# Patient Record
Sex: Male | Born: 1955 | Race: White | Hispanic: No | Marital: Single | Smoking: Former smoker
Health system: Southern US, Community
[De-identification: ages and names within clinical notes are randomized; demographics above are authoritative.]

## PROBLEM LIST (undated history)

## (undated) DIAGNOSIS — G8929 Other chronic pain: Secondary | ICD-10-CM

## (undated) DIAGNOSIS — F419 Anxiety disorder, unspecified: Secondary | ICD-10-CM

## (undated) DIAGNOSIS — M4850XA Collapsed vertebra, not elsewhere classified, site unspecified, initial encounter for fracture: Secondary | ICD-10-CM

## (undated) DIAGNOSIS — J189 Pneumonia, unspecified organism: Secondary | ICD-10-CM

## (undated) DIAGNOSIS — F1910 Other psychoactive substance abuse, uncomplicated: Secondary | ICD-10-CM

## (undated) DIAGNOSIS — I10 Essential (primary) hypertension: Secondary | ICD-10-CM

## (undated) DIAGNOSIS — I2510 Atherosclerotic heart disease of native coronary artery without angina pectoris: Secondary | ICD-10-CM

## (undated) HISTORY — PX: KNEE ARTHROSCOPY: SUR90

## (undated) HISTORY — PX: PENILE CYST REMOVAL: SHX2199

## (undated) HISTORY — PX: BACK SURGERY: SHX140

## (undated) HISTORY — PX: PILONIDAL CYST EXCISION: SHX744

---

## 2015-09-24 ENCOUNTER — Emergency Department (HOSPITAL_COMMUNITY)
Admission: EM | Admit: 2015-09-24 | Discharge: 2015-09-24 | Disposition: A | Discharge status: Home / Self Care | Payer: Medicare Other | Attending: Emergency Medicine | Admitting: Emergency Medicine

## 2015-09-24 ENCOUNTER — Emergency Department (HOSPITAL_COMMUNITY): Payer: Medicare Other

## 2015-09-24 ENCOUNTER — Encounter (HOSPITAL_COMMUNITY): Payer: Self-pay | Admitting: Emergency Medicine

## 2015-09-24 DIAGNOSIS — I10 Essential (primary) hypertension: Secondary | ICD-10-CM | POA: Diagnosis present

## 2015-09-24 DIAGNOSIS — Z79899 Other long term (current) drug therapy: Secondary | ICD-10-CM | POA: Diagnosis not present

## 2015-09-24 DIAGNOSIS — M7989 Other specified soft tissue disorders: Secondary | ICD-10-CM | POA: Insufficient documentation

## 2015-09-24 DIAGNOSIS — Z720 Tobacco use: Secondary | ICD-10-CM | POA: Insufficient documentation

## 2015-09-24 HISTORY — DX: Essential (primary) hypertension: I10

## 2015-09-24 MED ORDER — NAPROXEN 500 MG PO TABS: 500.0000 mg | ORAL_TABLET | Freq: Two times a day (BID) | ORAL | Status: DC

## 2015-09-24 NOTE — ED Provider Notes (Signed)
CSN: [id number]     Arrival date & time 09/24/15  1102 History   First MD Initiated Contact with Patient 09/24/15 1300     Chief Complaint  Patient presents with  . Hypertension     (Consider location/radiation/quality/duration/timing/severity/associated sxs/prior Treatment) Patient is a 59 y.o. male presenting with hypertension. The history is provided by the patient.  Hypertension Pertinent negatives include no chest pain, no abdominal pain, no headaches and no shortness of breath.   patient here with 2 concerns. Concerns about the elevated blood pressure concerned about a swelling area to his left medial knee area that is concerned may be a blood clot. Patient without any chest pain or shortness of breath. Patient states he has past history of hypertension but currently not on any meds. 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  Past Medical History  Diagnosis Date  . Hypertension    Past Surgical History  Procedure Laterality Date  . Back surgery    . Knee arthroscopy     Family History  Problem Relation Age of Onset  . Cancer Mother   . Hypertension Mother   . Heart failure Mother   . Cancer Father   . Hypertension Father   . Heart failure Father    Social History  Substance Use Topics  . Smoking status: Current Every Day Smoker -- 1.00 packs/day    Types: Cigarettes  . Smokeless tobacco: Never Used  . Alcohol Use: Yes     Comment: occas    Review of Systems  Constitutional: Negative for fever.  HENT: Negative for congestion.   Eyes: Negative for visual disturbance.  Respiratory: Negative for shortness of breath.   Cardiovascular: Positive for leg swelling. Negative for chest pain.  Gastrointestinal: Negative for abdominal pain.  Genitourinary: Negative for dysuria.  Musculoskeletal: Negative for back pain.  Skin: Negative for rash.  Neurological: Negative for headaches.  Hematological: Does not bruise/bleed easily.  Psychiatric/Behavioral:  Negative for confusion.      Allergies  Review of patient's allergies indicates no known allergies.  Home Medications   Prior to Admission medications   Medication Sig Start Date End Date Taking? Authorizing Provider  gabapentin (NEURONTIN) 300 MG capsule Take 300 mg by mouth 3 (three) times daily.   Yes Historical Provider, MD  oxyCODONE-acetaminophen (PERCOCET) 5-325 MG tablet Take 1 tablet by mouth 2 (two) times daily.   Yes Historical Provider, MD  naproxen (NAPROSYN) 500 MG tablet Take 1 tablet (500 mg total) by mouth 2 (two) times daily. 09/24/15   Vanetta MuldersScott Mykenna Viele, MD   BP 121/92 mmHg  Pulse 63  Temp(Src) 98.2 F (36.8 C) (Oral)  Resp 18  Ht 6\' 1"  (1.854 m)  Wt 248 lb (112.492 kg)  BMI 32.73 kg/m2  SpO2 98% Physical Exam  Constitutional: He is oriented to person, place, and time. He appears well-developed and well-nourished. No distress.  HENT:  Head: Normocephalic and atraumatic.  Mouth/Throat: Oropharynx is clear and moist.  Eyes: Conjunctivae and EOM are normal. Pupils are equal, round, and reactive to light.  Neck: Normal range of motion. Neck supple.  Cardiovascular: Normal rate, regular rhythm and normal heart sounds.   No murmur heard. Pulmonary/Chest: Effort normal and breath sounds normal. No respiratory distress.  Abdominal: Soft. Bowel sounds are normal. There is no tenderness.  Musculoskeletal: Normal range of motion. He exhibits no edema or tenderness.  Left leg around the area of the knee with a 2 x 4 cm mass it's fleshy  in nature may very well be a lipoma. Could be some dilated veins. No significant tenderness to it. No knee swelling. Distally no swelling to the ankle. Dorsalis pedis pulses 2+. Patient does have some varicose veins medially along the left leg consistent with the greater saphenous distribution.  Neurological: He is alert and oriented to person, place, and time. No cranial nerve deficit. He exhibits normal muscle tone. Coordination normal.   Skin: Skin is warm. No erythema.  Nursing note and vitals reviewed.   ED Course  Procedures (including critical care time) Labs Review Labs Reviewed - No data to display  Imaging Review No results found. I have personally reviewed and evaluated these images and lab results as part of my medical decision-making.   EKG Interpretation None      MDM   Final diagnoses:  Essential hypertension  Leg swelling    Patient with the marginal diastolic blood pressures here today will need follow-up blood may have hypertension that needs to be treated. Resource guide provided to help patient find follow-up. The patient left leg is proximal part of the knee medially has an area of swelling. This may very well represent some dilation of veins due to varicose veins but seems to be more consistent with a lipoma and that it's fleshy in nature. No calf tenderness no ankle swelling. His dorsalis pedis pulses 2+. No swelling to the knee.  If Doppler studies are negative patient can be discharged home with follow-up for blood pressure Naprosyn as needed for any leg discomfort. Patient without any shortness of breath or chest pain.    Vanetta Mulders, MD 09/24/15 [phone]

## 2015-09-24 NOTE — Discharge Instructions (Signed)
Resource guide provided to help you get follow-up for the high blood pressure. This needs to be checked on him or regular basis. May require treatment for hypertension. Take the Naprosyn as needed for leg discomfort. Return for any new or worse symptoms.   Emergency Department Resource Guide 1) Find a Doctor and Pay Out of Pocket Although you won't have to find out who is covered by your insurance plan, it is a good idea to ask around and get recommendations. You will then need to call the office and see if the doctor you have chosen will accept you as a new patient and what types of options they offer for patients who are self-pay. Some doctors offer discounts or will set up payment plans for their patients who do not have insurance, but you will need to ask so you aren't surprised when you get to your appointment.  2) Contact Your Local Health Department Not all health departments have doctors that can see patients for sick visits, but many do, so it is worth a call to see if yours does. If you don't know where your local health department is, you can check in your phone book. The CDC also has a tool to help you locate your state's health department, and many state websites also have listings of all of their local health departments.  3) Find a Walk-in Clinic If your illness is not likely to be very severe or complicated, you may want to try a walk in clinic. These are popping up all over the country in pharmacies, drugstores, and shopping centers. They're usually staffed by nurse practitioners or physician assistants that have been trained to treat common illnesses and complaints. They're usually fairly quick and inexpensive. However, if you have serious medical issues or chronic medical problems, these are probably not your best option.  No Primary Care Doctor: - Call Health Connect at  [phone] - they can help you locate a primary care doctor that  accepts your insurance, provides certain  services, etc. - Physician Referral Service- [phone]  Chronic Pain Problems: Organization         Address  Phone   Notes  [facility]  [phone] Patients need to be referred by their primary care doctor.   Medication Assistance: Organization         Address  Phone   Notes  [facility] Medication [facility] [address] [place], [place] [address] [phone] --Must be a resident of [facility] -- Must have NO insurance coverage whatsoever (no Medicaid/ Medicare, etc.) -- The pt. MUST have a primary care doctor that directs their care regularly and follows them in the community   MedAssist  [phone]   [employer]  [phone]    Agencies that provide inexpensive medical care: Organization         Address  Phone   Notes  [facility]  [phone]   [facility]    [phone]   [facility] [address] [place], [place] [address] [phone]   [facility] [address], [place] [phone]   [facility]anned Parenthood    [phone]   [facility]    [phone]   [facility]  [address], [place] Phone:  [phone], Fax:  [phone] Hours of Operation:  9 am - 6 pm, M-F.  Also  accepts Medicaid/Medicare and self-pay.  [facility] for [place], [address], [place] Phone: [phone], Fax: [phone]. Hours of Operation:  8:30 am - 5:30 pm, M-F.  Also accepts Medicaid and self-pay.  [facility] [address], [place] Phone: [phone]   [place], [place], [place] [phone], Ext. [phone] Mondays & Thursdays: 7-9 AM.  First 15 patients are seen on a first come, first serve basis.    [place] Providers:  Organization         Address  Phone   Notes  [facility] [address], [place] [phone] Also accepts self-pay patients.  [facility] [id number] [place], [place]  [phone]   [place], [address], [place] [phone]   [facility] [address] [place] [phone]   Lucianne Lei [address], [place]   [phone] Only accepts [place] Access [place] patients after they have their name applied to their card.   Self-Pay (no insurance) in [facility]:  Organization         Address  Phone   Notes  Sickle Cell Patients, [facility] [place] [phone]   [facility] [place] [phone]   [facility] [place]  [place], [place], [place] [phone]   [facility]/Dr. Osei-Bonsu  [address], [place] or [address], [place] [phone] Phone number for both [place] and [place] locations is the same.  [facility] [address] [place] [phone]   [facility] [address], [place] or [address] [phone]   [facility] [address], [place] [phone], phone; [phone], fax Sees patients 1st and 3rd Saturday of every month.  Must not qualify for public or private insurance (i.e. Medicaid, Medicare, [place] Health Choice, Veterans' Benefits)  Household income should be no more than 200% of the poverty level The clinic cannot treat you if you are pregnant or think you are pregnant  Sexually transmitted diseases are not treated at the clinic.    Dental Care: Organization         Address  Phone  Notes  [facility] [phone] Accepts  children up to age 58 who are enrolled in [place] or [place]; pregnant women with a Medicaid card; and children who have applied for Medicaid or [place] Health Choice, but were declined, whose parents can pay a reduced fee at time of service.  [facility]  [address], [place] [phone] Accepts children up to age 73 who are enrolled in [place] or [place]; pregnant women with a Medicaid card; and children who have applied for Medicaid or [place] Health Choice, but were declined, whose parents can pay a reduced fee at time of service.  [facility] Adult [facility] Access PROGRAM  [place] [phone] Patients are seen by appointment only. Walk-ins are not accepted. [place] will see patients 13 years of age and older. Monday - Tuesday (8am-5pm)  Most Wednesdays (8:30-5pm) $30 per visit, cash only  [facility] [employer] PROGRAM  [address], [facility] [phone] Patients are seen by appointment only. Walk-ins are not accepted. [place] will see patients 76 years of age and older. One Wednesday Evening (Monthly: Volunteer Based).  $30 per visit, cash only  [place]  [phone] for adults; Children under age 76, call Graduate Pediatric Dentistry at [phone]. Children aged 3-14, please call [phone] to request a pediatric application.  Dental services are provided in all areas of dental care including fillings, crowns and bridges, complete and partial dentures, implants, gum treatment, root canals, and extractions. Preventive care is also provided. Treatment is provided to both adults and children. Patients are selected via a lottery and there is often a waiting list.   [facility] [address] [place]  [phone] [email or web address]   [facility] [address] [place], [place] [phone], Ext. [phone] Second and  Fourth Thursday of each month, opens at 6:30 AM; Clinic ends at 9 AM.  Patients are seen on a first-come first-served basis, and a limited number are seen during each clinic.   [facility]  [address] Hillard Danker [place], [place] [phone]   Eligibility Requirements You must have lived in [place], [place], or [place] counties for at least the last three months.   You cannot be eligible for state or federal sponsored [employer], including [employer], [place], or [employer].   You generally cannot be eligible for healthcare insurance through your employer.    How to apply: Eligibility screenings are held every Tuesday and Wednesday afternoon from 1:00 pm until 4:00 pm. You do not need an appointment for the interview!  [facility] [address], [place], [place]   [place]  [place] Department  Industry  [phone]    Behavioral Health Resources in the Community: Intensive Outpatient Programs Organization         Address  Phone  Notes  [place] Ruth. [address], [place], [place] [phone]   [facility] Outpatient [address], [place], [place]   ADS: Alcohol & Drug Svcs [address], [place], [place]   [place] [address]  [place], [place] or [phone]   Substance Abuse Resources Organization         Address  Phone  Notes  Alcohol and Drug Services  [phone]   [place]  [phone]   [facility] [place]   Chinita Pester  [phone]   Residential & Outpatient Substance Abuse Program  [phone]   Psychological Services Organization         Address  Phone  Notes  [facility] [place]  [place]  [phone]   [place]  [address], [place] or [phone]    Mobile Crisis Teams Organization         Address  Phone  Notes  [facility], Mobile Crisis Care Unit  [phone]   [facility]  [address] [place], [place]   [place] Levels [address] [place] [phone]    Self-Help/Support Groups Organization         Address  Phone  Notes  Mental Health Assoc. of [place] - variety of support groups  [place] Call for more information  Narcotics Anonymous (NA), [facility] [address], [employer] [place]  2 meetings at this location   Special educational needs teacher         Address  Phone  Notes  ASAP Residential Treatment [place],    [place]  [phone]   [facility]  [address] [place] [address]2831, [place], [place]   [place], [place] [phone] Admissions: 8am-3pm M-F  Incentives Substance [place] [address]    [place], [place] [phone]   [facility] [address] [place], [place], [place]   [facility] [address],  [place], [place]   [facility] Programs - Intensive Outpatient [address]een [address], [place], [place]   [facility] [place].,  [place], [place] [phone] or [phone]   [facility] (RTS) [address] [place], [place] Accepts Medicaid  [facility] [employer] [address],  [place] [phone] Substance Abuse/Addiction Treatment   [facility] Organization         Address  Phone  Notes  [facility]  [phone]   Domenic Schwab, PhD [address] Arlis Porta [place], [place]   [phone] or [phone]   [place], [place] [phone]   [facility] [address] [place], [place] [phone] Insurance/Medicaid/sponsorship through [facility] and Families [address], [address] [place]                                    [place], [place] [phone] [place] [address], [place] [phone]    Dr. Adele Schilder  [phone]   [facility] Dept. 1) [address] [place] 2) [place] 3)  [place] 65, [place] [phone]  [phone]   [place] [phone] or [phone] (After Hours)

## 2015-09-24 NOTE — ED Notes (Signed)
Pt reports hypertension and L leg venous edema.

## 2016-10-20 ENCOUNTER — Emergency Department (HOSPITAL_COMMUNITY)
Admission: EM | Admit: 2016-10-20 | Discharge: 2016-10-20 | Disposition: A | Discharge status: Home / Self Care | Payer: Medicare Other | Attending: Emergency Medicine | Admitting: Emergency Medicine

## 2016-10-20 ENCOUNTER — Emergency Department (HOSPITAL_COMMUNITY): Payer: Medicare Other

## 2016-10-20 ENCOUNTER — Encounter (HOSPITAL_COMMUNITY): Payer: Self-pay | Admitting: Emergency Medicine

## 2016-10-20 DIAGNOSIS — Z7982 Long term (current) use of aspirin: Secondary | ICD-10-CM | POA: Diagnosis not present

## 2016-10-20 DIAGNOSIS — Y939 Activity, unspecified: Secondary | ICD-10-CM | POA: Insufficient documentation

## 2016-10-20 DIAGNOSIS — S91332A Puncture wound without foreign body, left foot, initial encounter: Secondary | ICD-10-CM | POA: Diagnosis not present

## 2016-10-20 DIAGNOSIS — Y999 Unspecified external cause status: Secondary | ICD-10-CM | POA: Diagnosis not present

## 2016-10-20 DIAGNOSIS — Z79899 Other long term (current) drug therapy: Secondary | ICD-10-CM | POA: Insufficient documentation

## 2016-10-20 DIAGNOSIS — F17210 Nicotine dependence, cigarettes, uncomplicated: Secondary | ICD-10-CM | POA: Diagnosis not present

## 2016-10-20 DIAGNOSIS — I10 Essential (primary) hypertension: Secondary | ICD-10-CM | POA: Diagnosis not present

## 2016-10-20 DIAGNOSIS — W450XXA Nail entering through skin, initial encounter: Secondary | ICD-10-CM | POA: Insufficient documentation

## 2016-10-20 DIAGNOSIS — S99922A Unspecified injury of left foot, initial encounter: Secondary | ICD-10-CM | POA: Diagnosis present

## 2016-10-20 DIAGNOSIS — Y929 Unspecified place or not applicable: Secondary | ICD-10-CM | POA: Diagnosis not present

## 2016-10-20 DIAGNOSIS — M791 Myalgia: Secondary | ICD-10-CM | POA: Insufficient documentation

## 2016-10-20 DIAGNOSIS — M7918 Myalgia, other site: Secondary | ICD-10-CM

## 2016-10-20 LAB — COMPREHENSIVE METABOLIC PANEL
ALBUMIN: 3.8 g/dL (ref 3.5–5.0)
ALK PHOS: 43 U/L (ref 38–126)
ALT: 21 U/L (ref 17–63)
ANION GAP: 4 — AB (ref 5–15)
AST: 22 U/L (ref 15–41)
BILIRUBIN TOTAL: 0.9 mg/dL (ref 0.3–1.2)
BUN: 12 mg/dL (ref 6–20)
CALCIUM: 8.9 mg/dL (ref 8.9–10.3)
CO2: 28 mmol/L (ref 22–32)
Chloride: 104 mmol/L (ref 101–111)
Creatinine, Ser: 1.16 mg/dL (ref 0.61–1.24)
Glucose, Bld: 108 mg/dL — ABNORMAL HIGH (ref 65–99)
POTASSIUM: 4.4 mmol/L (ref 3.5–5.1)
Sodium: 136 mmol/L (ref 135–145)
TOTAL PROTEIN: 7.2 g/dL (ref 6.5–8.1)

## 2016-10-20 LAB — CBC WITH DIFFERENTIAL/PLATELET
BASOS ABS: 0 10*3/uL (ref 0.0–0.1)
BASOS PCT: 0 %
Eosinophils Absolute: 0.2 10*3/uL (ref 0.0–0.7)
Eosinophils Relative: 3 %
HEMATOCRIT: 45.4 % (ref 39.0–52.0)
Hemoglobin: 15.2 g/dL (ref 13.0–17.0)
LYMPHS PCT: 43 %
Lymphs Abs: 3.7 10*3/uL (ref 0.7–4.0)
MCH: 31.3 pg (ref 26.0–34.0)
MCHC: 33.5 g/dL (ref 30.0–36.0)
MCV: 93.4 fL (ref 78.0–100.0)
Monocytes Absolute: 0.8 10*3/uL (ref 0.1–1.0)
Monocytes Relative: 9 %
NEUTROS ABS: 3.8 10*3/uL (ref 1.7–7.7)
NEUTROS PCT: 45 %
Platelets: 219 10*3/uL (ref 150–400)
RBC: 4.86 MIL/uL (ref 4.22–5.81)
RDW: 13.8 % (ref 11.5–15.5)
WBC: 8.5 10*3/uL (ref 4.0–10.5)

## 2016-10-20 MED ORDER — DOXYCYCLINE HYCLATE 100 MG PO CAPS: 100.0000 mg | ORAL_CAPSULE | Freq: Two times a day (BID) | ORAL | 0 refills | Status: DC

## 2016-10-20 NOTE — Discharge Instructions (Signed)
Your vital signs within normal limits. I find no evidence of fever, no target-like lesions, no muscle weakness or other signs to suggest tickborne illness. The x-ray of your foot is negative for fracture, foreign body, or gas. Please use doxycycline 2 times daily with food until all taken. Please use warm Epsom salt soaks to your foot until the wound has healed. Please see Dr. Romeo AppleHarrison concerning the problems with your right knee.

## 2016-10-20 NOTE — ED Notes (Signed)
Patient transported to X-ray 

## 2016-10-20 NOTE — ED Provider Notes (Signed)
[facility]-EMERGENCY DEPT Provider Note   CSN: [id number] Arrival date & time: 10/20/16  [id number]     History   Chief Complaint Chief Complaint  Patient presents with  . Multiple Complaints    HPI Dave Williams is a 60 y.o. male.  Patient is a 60 year old male who presents to the emergency department with multiple problems. The patient states that he does not have a primary doctor. He says he only goes to see a doctor when he is home remedies no work, or he gets very sick.  Problem #1. "I stepped on a nail". The patient states that approximately 3 months ago he stepped on a rusty nail and he states that he's been having redness and warmth in the area of his left foot. He states that he has been having some fevers on and off for quite some time approximately 100 to high of 101. He has soreness of his feet mostly empties been on his feet for quite some time.  Problem #2 pain of the right knee. The patient states that he has had orthoscopic surgery in the past. He states that [place] He states that when he is on his feet for extended period of time he has swelling and tightness of his right knee. When he gets up in the morning he does not have as much tightness. He also has some discomfort in the knee from time to time. There's been no recent injury or trauma to the knee. No recent operations or procedures.  Problem #3 all my joints ache. The patient states that for over a month she's had pain in his joints, and soreness in his muscles. [place] and he is concerned about tickborne illnesses. He reports some fever from time to time, but has not had any unusual rash. The soreness in the muscles and the pain in the joints is transient. He reports some neck pain. No reported chills. No loss of control of facial or any other muscles.   The history is provided by the patient.    Past Medical History:  Diagnosis Date  .  Hypertension     There are no active problems to display for this patient.   Past Surgical History:  Procedure Laterality Date  . BACK SURGERY    . KNEE ARTHROSCOPY         Home Medications    Prior to Admission medications   Medication Sig Start Date End Date Taking? Authorizing Provider  acetaminophen (TYLENOL) 500 MG tablet Take 500 mg by mouth every 6 (six) hours as needed.   Yes Historical Provider, MD  aspirin 500 MG tablet Take 500 mg by mouth every 6 (six) hours as needed for pain.   Yes Historical Provider, MD    Family History Family History  Problem Relation Age of Onset  . Cancer Mother   . Hypertension Mother   . Heart failure Mother   . Cancer Father   . Hypertension Father   . Heart failure Father     Social History Social History  Substance Use Topics  . Smoking status: Current Every Day Smoker    Packs/day: 1.00    Types: Cigarettes  . Smokeless tobacco: Never Used  . Alcohol use No     Allergies   Patient has no known allergies.   Review of Systems Review of Systems  Musculoskeletal: Positive for arthralgias and myalgias.  Skin: Positive for wound.  All other systems reviewed and are negative.    Physical Exam Updated Vital Signs BP 144/90 (BP Location: Left Arm)   Pulse 80   Temp 98 F (36.7 C) (Oral)   Resp 20   Ht 6\' 3"  (1.905 m)   Wt 113.4 kg   SpO2 100%   BMI 31.25 kg/m   Physical Exam  Constitutional: He is oriented to person, place, and time. He appears well-developed and well-nourished.  Non-toxic appearance.  HENT:  Head: Normocephalic.  Right Ear: Tympanic membrane and external ear normal.  Left Ear: Tympanic membrane and external ear normal.  Eyes: EOM and lids are normal. Pupils are equal, round, and reactive to light.  Neck: Normal range of motion. Neck supple. Carotid bruit is not present.  Cardiovascular: Normal rate, regular rhythm, normal heart sounds, intact distal pulses and normal pulses.     Pulmonary/Chest: Breath sounds normal. No respiratory distress.  Abdominal: Soft. Bowel sounds are normal. There is no tenderness. There is no guarding.  Musculoskeletal: Normal range of motion.       Left foot: There is tenderness. There is normal range of motion.       Feet:  Patient has good range of motion of the upper and lower extremities, but states he has muscle soreness with certain movements.  There is pain with flexion and extension of the right knee. There is crepitus present. There is no effusion noted at this time. The knee is not hot.    Lymphadenopathy:       Head (right side): No submandibular adenopathy present.       Head (left side): No submandibular adenopathy present.    He has no cervical adenopathy.  Neurological: He is alert and oriented to person, place, and time. He has normal strength. No cranial nerve deficit or sensory deficit.  Skin: Skin is warm and dry.  Psychiatric: He has a normal mood and affect. His speech is normal.  Nursing note and vitals reviewed.    ED Treatments / Results  Labs (all labs ordered are listed, but only abnormal results are displayed) Labs Reviewed - No data to display  EKG  EKG Interpretation None       Radiology No results found.  Procedures Procedures (including critical care time)  Medications Ordered in ED Medications - No data to display   Initial Impression / Assessment and Plan / ED Course  I have reviewed the triage vital signs and the nursing notes.  Pertinent labs & imaging results that were available during my care of the patient were reviewed by me and considered in my medical decision making (see chart for details).  Clinical Course     **I have reviewed nursing notes, vital signs, and all appropriate lab and imaging results for this patient.*  Final Clinical Impressions(s) / ED Diagnoses  Vital signs within normal limits. Labs reviewed. Xray reviewed. No acute or Emergent changes at this  time. Pt given resources to obtain a PCP for follow up. Pt to return to the ED if any changes or problems.    Final diagnoses:  None    New Prescriptions New Prescriptions   No medications on file     Ivery QualeHobson Ardith Test, PA-C 10/21/16 2144    Bethann BerkshireJoseph Zammit, MD 10/22/16 [id number]

## 2016-10-20 NOTE — ED Triage Notes (Signed)
Patient states he stepped on a rusty nail 3 months ago and is still having redness and warmth to area on left foot. Also complaining of "all my joints are hurting for over a month."

## 2016-10-21 LAB — B. BURGDORFI ANTIBODIES

## 2018-04-18 ENCOUNTER — Emergency Department (HOSPITAL_COMMUNITY)
Admission: EM | Admit: 2018-04-18 | Discharge: 2018-04-18 | Disposition: A | Discharge status: Home / Self Care | Payer: Medicare Other | Attending: Emergency Medicine | Admitting: Emergency Medicine

## 2018-04-18 ENCOUNTER — Emergency Department (HOSPITAL_COMMUNITY): Payer: Medicare Other

## 2018-04-18 ENCOUNTER — Encounter (HOSPITAL_COMMUNITY): Payer: Self-pay | Admitting: Emergency Medicine

## 2018-04-18 ENCOUNTER — Other Ambulatory Visit: Payer: Self-pay

## 2018-04-18 DIAGNOSIS — M7989 Other specified soft tissue disorders: Secondary | ICD-10-CM | POA: Diagnosis present

## 2018-04-18 DIAGNOSIS — Z7982 Long term (current) use of aspirin: Secondary | ICD-10-CM | POA: Diagnosis not present

## 2018-04-18 DIAGNOSIS — I8002 Phlebitis and thrombophlebitis of superficial vessels of left lower extremity: Secondary | ICD-10-CM

## 2018-04-18 DIAGNOSIS — I82812 Embolism and thrombosis of superficial veins of left lower extremities: Secondary | ICD-10-CM | POA: Insufficient documentation

## 2018-04-18 DIAGNOSIS — F17210 Nicotine dependence, cigarettes, uncomplicated: Secondary | ICD-10-CM | POA: Diagnosis not present

## 2018-04-18 DIAGNOSIS — I10 Essential (primary) hypertension: Secondary | ICD-10-CM | POA: Diagnosis not present

## 2018-04-18 NOTE — ED Triage Notes (Signed)
Patient c/o left leg pain x3 swelling. Patient states "Golfball sized area of swelling that appeared suddenly and is hard to touch."  [facility] sent him here to have ultrasound per patient.

## 2018-04-18 NOTE — Discharge Instructions (Signed)
Take over the counter NSAID (such as ibuprofen or naproxen), as directed on packaging, with food, until seen in follow up. Apply moist heat to the area(s) of discomfort, for 15 minutes at a time, several times per day for the next few days.  Do not fall asleep on a heating pack. Wear compression stockings daily until seen in follow up. Call your regular medical doctor today to schedule a follow up appointment this week. Call the Vascular Surgeon today to schedule a follow up appointment within the next week. Return to the Emergency Department immediately if worsening.

## 2018-04-18 NOTE — ED Notes (Signed)
Patient is off the floor to ultrasound

## 2018-04-18 NOTE — ED Provider Notes (Signed)
[facility] EMERGENCY DEPARTMENT Provider Note   CSN: [id number] Arrival date & time: 04/18/18  1135     History   Chief Complaint Chief Complaint  Patient presents with  . Leg Swelling    HPI Dave Williams is a 62 y.o. male.  HPI  Pt was seen at 1220. Per pt, c/o gradual onset and worsening of persistent "swelling" to left proximal knee area for the past 3 years, worse over the past 3 days. Pt has been evaluated previously for same, dx lipoma and superficial thrombosis of vein. Pt states over the past 3 days the area has "gotten bigger" and he is "worried about a blood clot." Denies injury, no fevers, no focal motor weakness, no tingling/numbness in extremities.   Past Medical History:  Diagnosis Date  . Hypertension     There are no active problems to display for this patient.   Past Surgical History:  Procedure Laterality Date  . BACK SURGERY    . KNEE ARTHROSCOPY    . PENILE CYST REMOVAL          Home Medications    Prior to Admission medications   Medication Sig Start Date End Date Taking? Authorizing Provider  acetaminophen (TYLENOL) 500 MG tablet Take 500 mg by mouth every 6 (six) hours as needed.    [provider]  aspirin 500 MG tablet Take 500 mg by mouth every 6 (six) hours as needed for pain.    [provider]  doxycycline (VIBRAMYCIN) 100 MG capsule Take 1 capsule (100 mg total) by mouth 2 (two) times daily. 10/20/16   Ivery Quale, PA-C    Family History Family History  Problem Relation Age of Onset  . Cancer Mother   . Hypertension Mother   . Heart failure Mother   . Cancer Father   . Hypertension Father   . Heart failure Father     Social History Social History   Tobacco Use  . Smoking status: Current Every Day Smoker    Packs/day: 1.00    Types: Cigarettes  . Smokeless tobacco: Never Used  Substance Use Topics  . Alcohol use: No  . Drug use: No     Allergies   Patient has no known allergies.   Review  of Systems Review of Systems ROS: Statement: All systems negative except as marked or noted in the HPI; Constitutional: Negative for fever and chills. ; ; Eyes: Negative for eye pain, redness and discharge. ; ; ENMT: Negative for ear pain, hoarseness, nasal congestion, sinus pressure and sore throat. ; ; Cardiovascular: Negative for chest pain, palpitations, diaphoresis, dyspnea and peripheral edema. ; ; Respiratory: Negative for cough, wheezing and stridor. ; ; Gastrointestinal: Negative for nausea, vomiting, diarrhea, abdominal pain, blood in stool, hematemesis, jaundice and rectal bleeding. . ; ; Genitourinary: Negative for dysuria, flank pain and hematuria. ; ; Musculoskeletal: Negative for back pain and neck pain. Negative for swelling and trauma.; ; Skin: Negative for pruritus, rash, abrasions, blisters, bruising and +skin lesion.; ; Neuro: Negative for headache, lightheadedness and neck stiffness. Negative for weakness, altered level of consciousness, altered mental status, extremity weakness, paresthesias, involuntary movement, seizure and syncope.       Physical Exam Updated Vital Signs BP 116/73 (BP Location: Right Arm)   Pulse 91   Temp 98 F (36.7 C) (Oral)   Resp 18   Ht  (1.905 m)   Wt 108 kg (238 lb)   SpO2 100%   BMI 29.75 kg/m  Physical Exam 1225: Physical examination:  Nursing notes reviewed; Vital signs and O2 SAT reviewed;  Constitutional: Well developed, Well nourished, Well hydrated, In no acute distress; Head:  Normocephalic, atraumatic; Eyes: EOMI, PERRL, No scleral icterus; ENMT: Mouth and pharynx normal, Mucous membranes moist; Neck: Supple, Full range of motion, No lymphadenopathy; Cardiovascular: Regular rate and rhythm, No gallop; Respiratory: Breath sounds clear & equal bilaterally, No wheezes.  Speaking full sentences with ease, Normal respiratory effort/excursion; Chest: Nontender, Movement normal; Abdomen: Soft, Nontender, Nondistended, Normal bowel  sounds; Genitourinary: No CVA tenderness; Extremities: Peripheral pulses normal, No deformity. No edema, No calf tenderness, edema or asymmetry.  +approximately 4x6cm circumscribed firm area just proximal to left medial knee, NT to palp. Mild overlying erythema and visible varicose vein. No streaking. No fluctuance, no central pointing area, no open wounds. Multiple varicose veins to LLE. Strong palp pedal pulses. NT left knee/ankle/foot. ; Neuro: AA&Ox3, Major CN grossly intact.  Speech clear. No gross focal motor or sensory deficits in extremities.; Skin: Color normal, Warm, Dry.   ED Treatments / Results  Labs (all labs ordered are listed, but only abnormal results are displayed)   EKG None  Radiology   Procedures Procedures (including critical care time)  Medications Ordered in ED Medications - No data to display   Initial Impression / Assessment and Plan / ED Course  I have reviewed the triage vital signs and the nursing notes.  Pertinent labs & imaging results that were available during my care of the patient were reviewed by me and considered in my medical decision making (see chart for details).  MDM Reviewed: previous chart, nursing note and vitals Reviewed previous: ultrasound Interpretation: ultrasound   CLINICAL DATA:  Left lower extremity edema with soft tissue prominence slightly superior to the knee medially.  EXAM: LEFT LOWER EXTREMITY VENOUS DUPLEX ULTRASOUND  TECHNIQUE: Gray-scale sonography with graded compression, as well as color Doppler and duplex ultrasound were performed to evaluate the left lower extremity deep venous system from the level of the common femoral vein and including the common femoral, femoral, profunda femoral, popliteal and calf veins including the posterior tibial, peroneal and gastrocnemius veins when visible. The superficial great saphenous vein was also interrogated. Spectral Doppler was utilized to evaluate flow at rest and  with distal augmentation maneuvers in the common femoral, femoral and popliteal veins.  COMPARISON:  None.  FINDINGS: Contralateral Common Femoral Vein: Respiratory phasicity is normal and symmetric with the symptomatic side. No evidence of thrombus. Normal compressibility.  Common Femoral Vein: No evidence of thrombus. Normal compressibility, respiratory phasicity and response to augmentation.  Saphenofemoral Junction: No evidence of thrombus. Normal compressibility and flow on color Doppler imaging.  Profunda Femoral Vein: No evidence of thrombus. Normal compressibility and flow on color Doppler imaging.  Femoral Vein: No evidence of thrombus. Normal compressibility, respiratory phasicity and response to augmentation.  Popliteal Vein: No evidence of thrombus. Normal compressibility, respiratory phasicity and response to augmentation.  Calf Veins: No evidence of thrombus. Normal compressibility and flow on color Doppler imaging.  Superficial Great Saphenous Vein: In the areas of soft tissue prominence slightly superior to the knee, there are foci of vessel distention with acute appearing thrombus occluding the greater saphenous vein in these areas. There is no thrombus approaching the saphenofemoral junction, however.  Venous Reflux:  None.  Other Findings:  None.  IMPRESSION: No evidence of left lower extremity deep venous thrombosis. Right common femoral vein patent.  In the areas of soft tissue prominence slightly superior  to the left knee medially, there is vessel distention due to thrombus filling the vessel focally in these areas consistent with superficial venous thrombosis. These areas do not show augmentation or compression. The saphenofemoral junction region appears normal.  Electronically Signed   By: Bretta Bang III M.D.   On: 09/24/2015 17:08   US Venous Img Lower  Left (dvt Study) Result Date: 04/18/2018 CLINICAL DATA:  62 year old male with  left lower extremity edema EXAM: LEFT LOWER EXTREMITY VENOUS DOPPLER ULTRASOUND TECHNIQUE: Gray-scale sonography with graded compression, as well as color Doppler and duplex ultrasound were performed to evaluate the lower extremity deep venous systems from the level of the common femoral vein and including the common femoral, femoral, profunda femoral, popliteal and calf veins including the posterior tibial, peroneal and gastrocnemius veins when visible. The superficial great saphenous vein was also interrogated. Spectral Doppler was utilized to evaluate flow at rest and with distal augmentation maneuvers in the common femoral, femoral and popliteal veins. COMPARISON:  None. FINDINGS: Contralateral Common Femoral Vein: Respiratory phasicity is normal and symmetric with the symptomatic side. No evidence of thrombus. Normal compressibility. Common Femoral Vein: No evidence of thrombus. Normal compressibility, respiratory phasicity and response to augmentation. Saphenofemoral Junction: No evidence of thrombus. Normal compressibility and flow on color Doppler imaging. Profunda Femoral Vein: No evidence of thrombus. Normal compressibility and flow on color Doppler imaging. Femoral Vein: No evidence of thrombus. Normal compressibility, respiratory phasicity and response to augmentation. Popliteal Vein: No evidence of thrombus. Normal compressibility, respiratory phasicity and response to augmentation. Calf Veins: No evidence of thrombus. Normal compressibility and flow on color Doppler imaging. Superficial Great Saphenous Vein: Abnormal great saphenous vein in the distal thigh. The vein becomes aneurysmal and is noncompressible. The internal lumen is filled with heterogeneous echogenic material. The vessel appears patent both distally and proximally. Venous Reflux:  None. Other Findings:  None. IMPRESSION: 1. Positive for superficial thrombophlebitis involving the left great saphenous vein and associated prominent venous  varix in the distal thigh. 2. No evidence of deep venous thrombosis. Electronically Signed   By: Malachy Moan M.D.   On: 04/18/2018 13:37    1445:  Spoke with Rads MD: Vasc US today appears similar to previous in 2016 (ie: superficial thrombophlebitis, no DVT). Tx symptomatically, f/u Vasc MD. Dx and testing d/w pt.  Questions answered.  Verb understanding, agreeable to d/c home with outpt f/u.    Final Clinical Impressions(s) / ED Diagnoses   Final diagnoses:  None    ED Discharge Orders    None       Samuel Jester, DO 04/21/18 2114

## 2019-07-22 ENCOUNTER — Emergency Department (HOSPITAL_COMMUNITY): Payer: Medicare Other

## 2019-07-22 ENCOUNTER — Emergency Department (HOSPITAL_COMMUNITY)
Admission: EM | Admit: 2019-07-22 | Discharge: 2019-07-22 | Discharge status: Against Medical Advice | Payer: Medicare Other | Attending: Emergency Medicine | Admitting: Emergency Medicine

## 2019-07-22 ENCOUNTER — Other Ambulatory Visit: Payer: Self-pay

## 2019-07-22 ENCOUNTER — Encounter (HOSPITAL_COMMUNITY): Payer: Self-pay | Admitting: Emergency Medicine

## 2019-07-22 DIAGNOSIS — M25552 Pain in left hip: Secondary | ICD-10-CM

## 2019-07-22 DIAGNOSIS — Z79899 Other long term (current) drug therapy: Secondary | ICD-10-CM | POA: Insufficient documentation

## 2019-07-22 DIAGNOSIS — F17210 Nicotine dependence, cigarettes, uncomplicated: Secondary | ICD-10-CM | POA: Diagnosis not present

## 2019-07-22 DIAGNOSIS — I10 Essential (primary) hypertension: Secondary | ICD-10-CM | POA: Diagnosis not present

## 2019-07-22 NOTE — ED Triage Notes (Signed)
Pt c/o chronic pain to LT hip, but about 10 days ago pain worsened. 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 Pt ambulatory.

## 2019-07-22 NOTE — ED Notes (Signed)
Patient came out of room and stated "I have to go. 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 So I can't wait any longer." Patient signed AMA. Advised Dr Thurnell Garbe. Patient ambulated to exit with no assistance or difficulty.

## 2019-07-22 NOTE — ED Provider Notes (Signed)
[facility] Provider Note   CSN: [id number] Arrival date & time: 07/22/19  1023     History   Chief Complaint Chief Complaint  Patient presents with  . Hip Pain    HPI Dave Williams is a 63 y.o. male.     HPI  Pt was seen at 1155. Per pt, c/o gradual onset and persistence of constant left hip "pain" for the past 2 weeks. 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 Pt denies direct injury. Denies back pain, no neck pain, no abd pain, no N/V/D, no fever, no injury.  Denies incont/retention of bowel or bladder, no saddle anesthesia, no focal motor weakness, no tingling/numbness in extremities, no dysuria/hematuria, no testicular pain/swelling.     Past Medical History:  Diagnosis Date  . Hypertension     There are no active problems to display for this patient.   Past Surgical History:  Procedure Laterality Date  . BACK SURGERY    . KNEE ARTHROSCOPY    . PENILE CYST REMOVAL          Home Medications    Prior to Admission medications   Medication Sig Start Date End Date Taking? Authorizing Provider  gabapentin (NEURONTIN) 100 MG capsule Take 100 mg by mouth 3 (three) times daily. 03/13/18   [provider]  ibuprofen (ADVIL,MOTRIN) 200 MG tablet Take 400 mg by mouth every 6 (six) hours as needed.    [provider]    Family History Family History  Problem Relation Age of Onset  . Cancer Mother   . Hypertension Mother   . Heart failure Mother   . Cancer Father   . Hypertension Father   . Heart failure Father     Social History Social History   Tobacco Use  . Smoking status: Current Every Day Smoker    Packs/day: 0.50    Types: Cigarettes  . Smokeless tobacco: Never Used  Substance Use Topics  . Alcohol use: No  . Drug  use: No     Allergies   Patient has no known allergies.   Review of Systems Review of Systems ROS: Statement: All systems negative except as marked or noted in the HPI; Constitutional: Negative for fever and chills. ; ; Eyes: Negative for eye pain, redness and discharge. ; ; ENMT: Negative for ear pain, hoarseness, nasal congestion, sinus pressure and sore throat. ; ; Cardiovascular: Negative for chest pain, palpitations, diaphoresis, dyspnea and peripheral edema. ; ; Respiratory: Negative for cough, wheezing and stridor. ; ; Gastrointestinal: Negative for nausea, vomiting, diarrhea, abdominal pain, blood in stool, hematemesis, jaundice and rectal bleeding. . ; ; Genitourinary: Negative for dysuria, flank pain and hematuria. ; ; Genital:  No penile drainage or rash, no testicular pain or swelling, no scrotal rash or swelling. ;; Musculoskeletal: +left hip pain. Negative for back pain and neck pain. Negative for swelling and trauma.; ; Skin: Negative for pruritus, rash, abrasions, blisters, bruising and skin lesion.; ; Neuro: Negative for headache, lightheadedness and neck stiffness. Negative for weakness, altered level of consciousness, altered mental status, extremity weakness, paresthesias, involuntary movement, seizure and syncope.       Physical Exam Updated Vital Signs BP (!) 144/91 (BP Location: Right Arm)   Pulse 76   Temp  98.3 F (36.8 C) (Oral)   Resp 16   Ht 6\' 3"  (1.905 m)   Wt 113.4 kg   SpO2 99%   BMI 31.25 kg/m   Physical Exam 1200: Physical examination:  Nursing notes reviewed; Vital signs and O2 SAT reviewed;  Constitutional: Well developed, Well nourished, Well hydrated, In no acute distress; Head:  Normocephalic, atraumatic; Eyes: EOMI, PERRL, No scleral icterus; ENMT: Mouth and pharynx normal, Mucous membranes moist; Neck: Supple, Full range of motion, No lymphadenopathy; Cardiovascular: Regular rate and rhythm, No gallop; Respiratory: Breath sounds clear & equal  bilaterally, No wheezes.  Speaking full sentences with ease, Normal respiratory effort/excursion; Chest: Nontender, Movement normal; Abdomen: Soft, Nontender, Nondistended, Normal bowel sounds; Genitourinary: No CVA tenderness; Spine:  No midline CS, TS, LS tenderness.;;  Extremities: Pelvis stable. +mild TTP left hip. NT left knee/ankle/foot. Peripheral pulses normal, no deformity. No edema, No calf edema or asymmetry.; Neuro: AA&Ox3, Major CN grossly intact.  Speech clear. No gross focal motor or sensory deficits in extremities. Climbs on and off stretcher easily by himself. Gait steady..; Skin: Color normal, Warm, Dry.   ED Treatments / Results  Labs (all labs ordered are listed, but only abnormal results are displayed)   EKG None  Radiology   Procedures Procedures (including critical care time)  Medications Ordered in ED Medications - No data to display   Initial Impression / Assessment and Plan / ED Course  I have reviewed the triage vital signs and the nursing notes.  Pertinent labs & imaging results that were available during my care of the patient were reviewed by me and considered in my medical decision making (see chart for details).     MDM Reviewed: previous chart, nursing note and vitals Interpretation: x-ray   Dg Hip Unilat W Or Wo Pelvis 2-3 Views Left Result Date: 07/22/2019 CLINICAL DATA:  Pain EXAM: DG HIP (WITH OR WITHOUT PELVIS) 2-3V LEFT COMPARISON:  None. FINDINGS: Frontal pelvis as well as frontal and lateral left hip images obtained. No fracture or dislocation. Joint spaces appear normal. No erosive change. There are foci of calcification in each common femoral artery. IMPRESSION: No fracture or dislocation. No appreciable arthropathy. Foci of atherosclerotic calcification noted. Electronically Signed   By: Lowella Grip III M.D.   On: 07/22/2019 10:51    Dave Williams was evaluated in Emergency Department on 07/22/2019 for the symptoms described in  the history of present illness. He was evaluated in the context of the global COVID-19 pandemic, which necessitated consideration that the patient might be at risk for infection with the SARS-CoV-2 virus that causes COVID-19. Institutional protocols and algorithms that pertain to the evaluation of patients at risk for COVID-19 are in a state of rapid change based on information released by regulatory bodies including the CDC and federal and state organizations. These policies and algorithms were followed during the patient's care in the ED.   1345:  Pt stated he continued to have pain with ambulation despite reassuring XR above. CT ordered. Pt now states he does not want to wait in the ED any longer for imaging studies and "just came for pain medicine anyway." Pt then walked out of the ED.       Final Clinical Impressions(s) / ED Diagnoses   Final diagnoses:  None    ED Discharge Orders    None       Francine Graven, DO 07/25/19 1528

## 2019-07-23 ENCOUNTER — Encounter (HOSPITAL_COMMUNITY): Payer: Self-pay | Admitting: *Deleted

## 2019-07-23 ENCOUNTER — Emergency Department (HOSPITAL_COMMUNITY)
Admission: EM | Admit: 2019-07-23 | Discharge: 2019-07-23 | Disposition: A | Discharge status: Home / Self Care | Payer: Medicare Other | Attending: Emergency Medicine | Admitting: Emergency Medicine

## 2019-07-23 DIAGNOSIS — F17210 Nicotine dependence, cigarettes, uncomplicated: Secondary | ICD-10-CM | POA: Insufficient documentation

## 2019-07-23 DIAGNOSIS — I10 Essential (primary) hypertension: Secondary | ICD-10-CM | POA: Diagnosis not present

## 2019-07-23 DIAGNOSIS — M25552 Pain in left hip: Secondary | ICD-10-CM | POA: Diagnosis not present

## 2019-07-23 MED ORDER — NAPROXEN 500 MG PO TABS: 500.0000 mg | ORAL_TABLET | Freq: Two times a day (BID) | ORAL | 0 refills | Status: DC | PRN

## 2019-07-23 MED ORDER — CYCLOBENZAPRINE HCL 10 MG PO TABS: 10.0000 mg | ORAL_TABLET | Freq: Two times a day (BID) | ORAL | 0 refills | Status: DC | PRN

## 2019-07-23 NOTE — Discharge Instructions (Addendum)
Try naproxen and ice pack as needed for pain.  Be careful naproxen can be hard on your stomach and kidneys. Follow-up with orthopedics as discussed.  Take Tylenol every 4-6 hours as needed as well for pain. Return for fevers, leg weakness, persistent numbness or new symptoms. Take flexeril for muscle spasms, it can make you tired so no driving while taking.

## 2019-07-23 NOTE — ED Provider Notes (Addendum)
[facility] Provider [facility]   CSN: [id number] Arrival date & time: 07/23/19  [id number]     History   Chief Complaint Chief Complaint  Patient presents with  . Hip Pain    HPI Dave Williams is a 63 y.o. male.     Patient presents with recurrent left hip pain for almost 2 months.  Patient was seen yesterday had x-ray done however had to leave prior to further evaluation.  Patient says is worse with flexing his left hip.  No direct trauma however he does left and help heavyset person regularly.  Patient has history of chronic spinal stenosis.  Patient is tried multiple different medications for different pains over the years.  Patient denies fevers or chills no respiratory symptoms.     Past Medical History:  Diagnosis Date  . Hypertension     There are no active problems to display for this patient.   Past Surgical History:  Procedure Laterality Date  . BACK SURGERY    . KNEE ARTHROSCOPY    . PENILE CYST REMOVAL          Home Medications    Prior to Admission medications   Medication Sig Start Date End Date Taking? Authorizing Provider  cyclobenzaprine (FLEXERIL) 10 MG tablet Take 1 tablet (10 mg total) by mouth 2 (two) times daily as needed for muscle spasms. 07/23/19   Blane OharaZavitz, Dorotha Hirschi, MD  gabapentin (NEURONTIN) 100 MG capsule Take 100 mg by mouth 3 (three) times daily. 03/13/18   [provider]  ibuprofen (ADVIL,MOTRIN) 200 MG tablet Take 400 mg by mouth every 6 (six) hours as needed.    [provider]  naproxen (NAPROSYN) 500 MG tablet Take 1 tablet (500 mg total) by mouth 2 (two) times daily as needed. 07/23/19   Blane OharaZavitz, Beau Vanduzer, MD    Family History Family History  Problem Relation Age of Onset  . Cancer Mother   . Hypertension Mother   . Heart failure Mother   . Cancer Father   . Hypertension Father   . Heart failure Father     Social History Social History   Tobacco Use  . Smoking status: Current Every Day Smoker     Packs/day: 0.50    Types: Cigarettes  . Smokeless tobacco: Never Used  Substance Use Topics  . Alcohol use: No  . Drug use: No     Allergies   Patient has no known allergies.   Review of Systems Review of Systems  Constitutional: Negative for chills and fever.  Respiratory: Negative for shortness of breath.   Cardiovascular: Negative for chest pain.  Gastrointestinal: Negative for abdominal pain and vomiting.  Genitourinary: Negative for flank pain.  Musculoskeletal: Negative for back pain.  Neurological: Negative for weakness, light-headedness, numbness and headaches.     Physical Exam Updated Vital Signs BP (!) 145/93 (BP Location: Right Arm)   Pulse 77   Temp 97.8 F (36.6 C) (Temporal)   Resp 20   Ht 6\' 3"  (1.905 m)   Wt 113.4 kg   SpO2 98%   BMI 31.25 kg/m   Physical Exam Vitals signs and nursing note reviewed.  Constitutional:      Appearance: He is well-developed.  HENT:     Head: Normocephalic.  Eyes:     General:        Right eye: No discharge.        Left eye: No discharge.  Neck:     Musculoskeletal: Normal range of motion and  neck supple.     Trachea: No tracheal deviation.  Cardiovascular:     Rate and Rhythm: Normal rate.  Pulmonary:     Effort: Pulmonary effort is normal.  Abdominal:     General: There is no distension.     Tenderness: There is no abdominal tenderness. There is no guarding.  Musculoskeletal: Normal range of motion.        General: Tenderness present.     Comments: Patient has tenderness with flexion and external rotation of left hip.  No deformity, no leg shortening.  Sensation intact distal left leg, 2+ pulses intact.  Patient able to flex knee and hip with mild tenderness to anterior hip.  No external sign of infection on exam.  Normal pulses in the left groin.  Patient has 5+ strength with flexion-extension of major joints in the left leg.  Sensation intact palpation lower extremity on the left  Skin:    General:  Skin is warm.     Findings: No rash.  Neurological:     Mental Status: He is alert.      ED Treatments / Results  Labs (all labs ordered are listed, but only abnormal results are displayed) Labs Reviewed - No data to display  EKG None  Radiology Dg Hip Unilat W Or Wo Pelvis 2-3 Views Left  Result Date: 07/22/2019 CLINICAL DATA:  Pain EXAM: DG HIP (WITH OR WITHOUT PELVIS) 2-3V LEFT COMPARISON:  None. FINDINGS: Frontal pelvis as well as frontal and lateral left hip images obtained. No fracture or dislocation. Joint spaces appear normal. No erosive change. There are foci of calcification in each common femoral artery. IMPRESSION: No fracture or dislocation. No appreciable arthropathy. Foci of atherosclerotic calcification noted. Electronically Signed   By: Lowella Grip III M.D.   On: 07/22/2019 10:51    Procedures Procedures (including critical care time)  Medications Ordered in ED Medications - No data to display   Initial Impression / Assessment and Plan / ED Course  I have reviewed the triage vital signs and the nursing notes.  Pertinent labs & imaging results that were available during my care of the patient were reviewed by me and considered in my medical decision making (see chart for details).       Clinical concern for tendinitis/bursitis no direct trauma history.  X-ray from yesterday reviewed no acute fracture or significant arthritis.  Reviewed this with the patient.  Discussed plan for medicines, follow-up with orthopedics to discuss further treatment options, work note.  Reasons to return discussed    Final Clinical Impressions(s) / ED Diagnoses   Final diagnoses:  Left hip pain    ED Discharge Orders         Ordered    cyclobenzaprine (FLEXERIL) 10 MG tablet  2 times daily PRN     07/23/19 1005    naproxen (NAPROSYN) 500 MG tablet  2 times daily PRN     07/23/19 1005           Elnora Morrison, MD 07/23/19 1012    Elnora Morrison, MD  07/23/19 1012

## 2019-07-23 NOTE — ED Notes (Signed)
Advised patient not to drive while taking prescription pain medication. Patient verbalized understanding.  

## 2019-07-23 NOTE — ED Triage Notes (Signed)
Pain in left hip seen yesterday for same. Unable to stay for completion of treatment yesterday

## 2019-08-01 ENCOUNTER — Encounter: Payer: Self-pay | Admitting: Orthopaedic Surgery

## 2019-08-01 ENCOUNTER — Ambulatory Visit (INDEPENDENT_AMBULATORY_CARE_PROVIDER_SITE_OTHER): Payer: Medicare Other | Admitting: Orthopaedic Surgery

## 2019-08-01 ENCOUNTER — Other Ambulatory Visit: Payer: Self-pay

## 2019-08-01 VITALS — BP 135/90 | HR 83 | Temp 97.2°F | Ht 75.0 in | Wt 241.5 lb

## 2019-08-01 DIAGNOSIS — M25552 Pain in left hip: Secondary | ICD-10-CM | POA: Diagnosis not present

## 2019-08-01 DIAGNOSIS — F17210 Nicotine dependence, cigarettes, uncomplicated: Secondary | ICD-10-CM

## 2019-08-01 DIAGNOSIS — G8929 Other chronic pain: Secondary | ICD-10-CM | POA: Diagnosis not present

## 2019-08-01 MED ORDER — HYDROCODONE-ACETAMINOPHEN 5-325 MG PO TABS: ORAL_TABLET | ORAL | 0 refills | Status: DC

## 2019-08-01 MED ORDER — PREDNISONE 5 MG (21) PO TBPK
ORAL_TABLET | ORAL | 0 refills | Status: DC
Start: 2019-08-01 — End: 2019-08-20

## 2019-08-01 NOTE — Progress Notes (Signed)
Subjective:    Patient ID: Dave IgoWilliam Williams, male    DOB: 06/13/1956, 63 y.o.   MRN: [id number]  HPI  He has had pain in the left hip for six weeks or more.  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  He has had increasing pain of the left hip.  He has decreased motion.  It used to bother him first thing in the morning then get better. He has pain most of the time now.  He has no trauma.  He has tried Tylenol, Advil, rubs, ice with no help.  He had x-rays of the left hip done 07-22-2019 which were negative.  He is concerned as he is getting worse.    Review of Systems  Constitutional: Positive for activity change.  Musculoskeletal: Positive for arthralgias, back pain and gait problem.  All other systems reviewed and are negative.  For Review of Systems, all other systems reviewed and are negative.  The following is a summary of the past history medically, past history surgically, known current medicines, social history and family history.  This information is gathered electronically by the computer from prior information and documentation.  I review this each visit and have found including this information at this point in the chart is beneficial and informative.   Past Medical History:  Diagnosis Date  . Hypertension     Past Surgical History:  Procedure Laterality Date  . BACK SURGERY    . KNEE ARTHROSCOPY    . PENILE CYST REMOVAL      Current Outpatient Medications on File Prior to Visit  Medication Sig Dispense Refill  . cyclobenzaprine (FLEXERIL) 10 MG tablet Take 1 tablet (10 mg total) by mouth 2 (two) times daily as needed for muscle spasms. 12 tablet 0  . gabapentin (NEURONTIN) 100 MG capsule Take 100 mg by mouth 3 (three) times daily.    Marland Kitchen. ibuprofen (ADVIL,MOTRIN) 200 MG tablet Take 400 mg by mouth every 6 (six) hours as needed.    . naproxen (NAPROSYN) 500 MG tablet Take 1 tablet (500 mg total) by mouth 2 (two) times daily as needed. 15  tablet 0   No current facility-administered medications on file prior to visit.     Social History   Socioeconomic History  . Marital status: Single    Spouse name: Not on file  . Number of children: Not on file  . Years of education: Not on file  . Highest education level: Not on file  Occupational History  . Not on file  Social Needs  . Financial resource strain: Not on file  . Food insecurity    Worry: Not on file    Inability: Not on file  . Transportation needs    Medical: Not on file    Non-medical: Not on file  Tobacco Use  . Smoking status: Current Every Day Smoker    Packs/day: 0.50    Types: Cigarettes  . Smokeless tobacco: Never Used  Substance and Sexual Activity  . Alcohol use: No  . Drug use: No  . Sexual activity: Not on file  Lifestyle  . Physical activity    Days per week: Not on file    Minutes per session: Not on file  . Stress: Not on file  Relationships  . Social Musicianconnections    Talks on phone: Not on file    Gets together: Not on file    Attends religious service: Not on file  Active member of club or organization: Not on file    Attends meetings of clubs or organizations: Not on file    Relationship status: Not on file  . Intimate partner violence    Fear of current or ex partner: Not on file    Emotionally abused: Not on file    Physically abused: Not on file    Forced sexual activity: Not on file  Other Topics Concern  . Not on file  Social History Narrative  . Not on file    Family History  Problem Relation Age of Onset  . Cancer Mother   . Hypertension Mother   . Heart failure Mother   . Cancer Father   . Hypertension Father   . Heart failure Father     BP 135/90   Pulse 83   Temp (!) 97.2 F (36.2 C)   Ht 6\' 3"  (1.905 m)   Wt 241 lb 8 oz (109.5 kg)   BMI 30.19 kg/m   Body mass index is 30.19 kg/m.     Objective:   Physical Exam Vitals signs reviewed.  Constitutional:      Appearance: He is well-developed.   HENT:     Head: Normocephalic and atraumatic.  Eyes:     Conjunctiva/sclera: Conjunctivae normal.     Pupils: Pupils are equal, round, and reactive to light.  Neck:     Musculoskeletal: Normal range of motion and neck supple.  Cardiovascular:     Rate and Rhythm: Normal rate and regular rhythm.  Pulmonary:     Effort: Pulmonary effort is normal.  Abdominal:     Palpations: Abdomen is soft.  Musculoskeletal:     Left hip: He exhibits decreased range of motion.       Legs:  Skin:    General: Skin is warm and dry.  Neurological:     Mental Status: He is alert and oriented to person, place, and time.     Cranial Nerves: No cranial nerve deficit.     Motor: No abnormal muscle tone.     Coordination: Coordination normal.     Deep Tendon Reflexes: Reflexes are normal and symmetric. Reflexes normal.  Psychiatric:        Behavior: Behavior normal.        Thought Content: Thought content normal.        Judgment: Judgment normal.    I have reviewed the x-rays and ER visit notes of 07-22-2019.       Assessment & Plan:   Encounter Diagnoses  Name Primary?  . Chronic left hip pain Yes  . Nicotine dependence, cigarettes, uncomplicated    I will begin prednisone dose pack and pain medicine.  Return in two weeks.  Consider MRI if not improved.  I have reviewed the [place] web site prior to prescribing narcotic medicine for this patient.   Call if any problem.  Precautions discussed.   Electronically Signed Sanjuana Kava, MD 8/20/202010:54 AM

## 2019-08-01 NOTE — Patient Instructions (Signed)

## 2019-08-20 ENCOUNTER — Other Ambulatory Visit: Payer: Self-pay

## 2019-08-20 ENCOUNTER — Encounter: Payer: Self-pay | Admitting: Orthopaedic Surgery

## 2019-08-20 ENCOUNTER — Ambulatory Visit (INDEPENDENT_AMBULATORY_CARE_PROVIDER_SITE_OTHER): Payer: Medicare Other | Admitting: Orthopaedic Surgery

## 2019-08-20 VITALS — BP 129/89 | HR 81 | Ht 75.0 in | Wt 240.0 lb

## 2019-08-20 DIAGNOSIS — G8929 Other chronic pain: Secondary | ICD-10-CM

## 2019-08-20 DIAGNOSIS — F17210 Nicotine dependence, cigarettes, uncomplicated: Secondary | ICD-10-CM | POA: Diagnosis not present

## 2019-08-20 DIAGNOSIS — M25552 Pain in left hip: Secondary | ICD-10-CM

## 2019-08-20 MED ORDER — HYDROCODONE-ACETAMINOPHEN 5-325 MG PO TABS: ORAL_TABLET | ORAL | 0 refills | Status: DC

## 2019-08-20 MED ORDER — PREDNISONE 5 MG (21) PO TBPK: ORAL_TABLET | ORAL | 0 refills | Status: DC

## 2019-08-20 NOTE — Progress Notes (Addendum)
Patient Dave Williams, male DOB:1956/11/18, 63 y.o. DVV:[id number]  Chief Complaint  Patient presents with  . Hip Pain    left     HPI  Dave Williams is a 63 y.o. male who has left hip pain.  The prednisone did help.  He is having the pain again.  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  He has no new trauma.   Body mass index is 30 kg/m.  ROS  Review of Systems  Constitutional: Positive for activity change.  Musculoskeletal: Positive for arthralgias, back pain and gait problem.  All other systems reviewed and are negative.   All other systems reviewed and are negative.  The following is a summary of the past history medically, past history surgically, known current medicines, social history and family history.  This information is gathered electronically by the computer from prior information and documentation.  I review this each visit and have found including this information at this point in the chart is beneficial and informative.    Past Medical History:  Diagnosis Date  . Hypertension     Past Surgical History:  Procedure Laterality Date  . BACK SURGERY    . KNEE ARTHROSCOPY    . PENILE CYST REMOVAL      Family History  Problem Relation Age of Onset  . Cancer Mother   . Hypertension Mother   . Heart failure Mother   . Cancer Father   . Hypertension Father   . Heart failure Father     Social History Social History   Tobacco Use  . Smoking status: Current Every Day Smoker    Packs/day: 0.50    Types: Cigarettes  . Smokeless tobacco: Never Used  Substance Use Topics  . Alcohol use: No  . Drug use: No    No Known Allergies  Current Outpatient Medications  Medication Sig Dispense Refill  . cyclobenzaprine (FLEXERIL) 10 MG tablet Take 1 tablet (10 mg total) by mouth 2 (two) times daily as needed for muscle spasms. (Patient not taking: Reported on 08/20/2019) 12 tablet 0  . gabapentin  (NEURONTIN) 100 MG capsule Take 100 mg by mouth 3 (three) times daily.    Marland Kitchen HYDROcodone-acetaminophen (NORCO/VICODIN) 5-325 MG tablet One tablet by mouth every six hours as needed for pain. 56 tablet 0  . ibuprofen (ADVIL,MOTRIN) 200 MG tablet Take 400 mg by mouth every 6 (six) hours as needed.    . naproxen (NAPROSYN) 500 MG tablet Take 1 tablet (500 mg total) by mouth 2 (two) times daily as needed. (Patient not taking: Reported on 08/20/2019) 15 tablet 0  . predniSONE (STERAPRED UNI-PAK 21 TAB) 5 MG (21) TBPK tablet Take 6 pills first day; 5 pills second day; 4 pills third day; 3 pills fourth day; 2 pills next day and 1 pill last day. 21 tablet 0   No current facility-administered medications for this visit.      Physical Exam  Blood pressure 129/89, pulse 81, height 6\' 3"  (1.905 m), weight 240 lb (108.9 kg).  Constitutional: overall normal hygiene, normal nutrition, well developed, normal grooming, normal body habitus. Assistive device:none  Musculoskeletal: gait and station Limp none, muscle tone and strength are normal, no tremors or atrophy is present.  .  Neurological: coordination overall normal.  Deep tendon reflex/nerve stretch intact.  Sensation normal.  Cranial nerves II-XII intact.   Skin:   Normal overall no scars, lesions, ulcers or rashes.  No psoriasis.  Psychiatric: Alert and oriented x 3.  Recent memory intact, remote memory unclear.  Normal mood and affect. Well groomed.  Good eye contact.  Cardiovascular: overall no swelling, no varicosities, no edema bilaterally, normal temperatures of the legs and arms, no clubbing, cyanosis and good capillary refill.  Lymphatic: palpation is normal.  Left hip is tender with some decreased motion.  No limp.  NV intact.  All other systems reviewed and are negative   The patient has been educated about the nature of the problem(s) and counseled on treatment options.  The patient appeared to understand what I have discussed and is in  agreement with it.  Encounter Diagnoses  Name Primary?  . Chronic left hip pain Yes  . Nicotine dependence, cigarettes, uncomplicated     PLAN Call if any problems.  Precautions discussed.  Continue current medications. I will call in prednisone again.  Return to clinic 1 month   I have reviewed the [facility] [place] [facility] [facility] [place] [facility] web site prior to prescribing narcotic medicine for this patient.   Electronically Signed Darreld McleanWayne Anela Bensman, MD 9/8/202011:28 AM

## 2019-09-17 ENCOUNTER — Encounter: Payer: Self-pay | Admitting: Orthopaedic Surgery

## 2019-09-17 ENCOUNTER — Other Ambulatory Visit: Payer: Self-pay

## 2019-09-17 ENCOUNTER — Ambulatory Visit (INDEPENDENT_AMBULATORY_CARE_PROVIDER_SITE_OTHER): Payer: Medicare Other | Admitting: Orthopaedic Surgery

## 2019-09-17 VITALS — BP 142/90 | HR 82 | Temp 97.1°F | Ht 75.0 in | Wt 241.2 lb

## 2019-09-17 DIAGNOSIS — M25552 Pain in left hip: Secondary | ICD-10-CM

## 2019-09-17 DIAGNOSIS — G8929 Other chronic pain: Secondary | ICD-10-CM | POA: Diagnosis not present

## 2019-09-17 MED ORDER — NAPROXEN 500 MG PO TABS: 500.0000 mg | ORAL_TABLET | Freq: Two times a day (BID) | ORAL | 5 refills | Status: DC

## 2019-09-17 NOTE — Progress Notes (Signed)
Patient PI:RJJOACZ Dave Williams, male DOB:[birth year], 63 y.o. [id number]  Chief Complaint  Patient presents with  . Hip Pain    left hip    HPI  Keilyn Nadal is a 63 y.o. male who has left hip pain that was doing well until the last couple of days.  He has no trauma.  He has no redness or numbness.  I will get MRI of the hip as he is having more pain. I will begin Naprosyn as well.   Body mass index is 30.15 kg/m.  ROS  Review of Systems  Constitutional: Positive for activity change.  Musculoskeletal: Positive for arthralgias, back pain and gait problem.  All other systems reviewed and are negative.   All other systems reviewed and are negative.  The following is a summary of the past history medically, past history surgically, known current medicines, social history and family history.  This information is gathered electronically by the computer from prior information and documentation.  I review this each visit and have found including this information at this point in the chart is beneficial and informative.    Past Medical History:  Diagnosis Date  . Hypertension     Past Surgical History:  Procedure Laterality Date  . BACK SURGERY    . KNEE ARTHROSCOPY    . PENILE CYST REMOVAL      Family History  Problem Relation Age of Onset  . Cancer Mother   . Hypertension Mother   . Heart failure Mother   . Cancer Father   . Hypertension Father   . Heart failure Father     Social History Social History   Tobacco Use  . Smoking status: Current Every Day Smoker    Packs/day: 0.50    Types: Cigarettes  . Smokeless tobacco: Never Used  Substance Use Topics  . Alcohol use: No  . Drug use: No    No Known Allergies  Current Outpatient Medications  Medication Sig Dispense Refill  . cyclobenzaprine (FLEXERIL) 10 MG tablet Take 1 tablet (10 mg total) by mouth 2 (two) times daily as needed for muscle spasms. 12 tablet 0  . gabapentin (NEURONTIN) 100 MG capsule  Take 100 mg by mouth 3 (three) times daily.    Marland Kitchen HYDROcodone-acetaminophen (NORCO/VICODIN) 5-325 MG tablet One tablet by mouth every six hours as needed for pain. (Patient not taking: Reported on 09/17/2019) 56 tablet 0   No current facility-administered medications for this visit.      Physical Exam  Blood pressure (!) 142/90, pulse 82, temperature (!) 97.1 F (36.2 C), height 6\' 3"  (1.905 m), weight 241 lb 3.2 oz (109.4 kg).  Constitutional: overall normal hygiene, normal nutrition, well developed, normal grooming, normal body habitus. Assistive device:none  Musculoskeletal: gait and station Limp left, muscle tone and strength are normal, no tremors or atrophy is present.  .  Neurological: coordination overall normal.  Deep tendon reflex/nerve stretch intact.  Sensation normal.  Cranial nerves II-XII intact.   Skin:   Normal overall no scars, lesions, ulcers or rashes. No psoriasis.  Psychiatric: Alert and oriented x 3.  Recent memory intact, remote memory unclear.  Normal mood and affect. Well groomed.  Good eye contact.  Cardiovascular: overall no swelling, no varicosities, no edema bilaterally, normal temperatures of the legs and arms, no clubbing, cyanosis and good capillary refill.  Lymphatic: palpation is normal.  All other systems reviewed and are negative   The patient has been educated about the nature of the problem(s) and counseled on  treatment options.  The patient appeared to understand what I have discussed and is in agreement with it.  Encounter Diagnosis  Name Primary?  . Chronic left hip pain Yes    PLAN Call if any problems.  Precautions discussed.  Continue current medications. Add Naprosyn.  Return to clinic 2 weeks   Begin Naprosyn.  Get MRI of the left hip.  Electronically Signed Darreld Mclean, MD 10/6/20209:03 AM

## 2019-09-18 ENCOUNTER — Encounter: Payer: Self-pay | Admitting: Orthopaedic Surgery

## 2019-09-20 ENCOUNTER — Ambulatory Visit (HOSPITAL_COMMUNITY): Admission: RE | Admit: 2019-09-20 | Payer: Medicare Other | Source: Ambulatory Visit

## 2019-09-27 ENCOUNTER — Other Ambulatory Visit: Payer: Self-pay

## 2019-09-27 ENCOUNTER — Ambulatory Visit (HOSPITAL_COMMUNITY)
Admission: RE | Admit: 2019-09-27 | Discharge: 2019-09-27 | Disposition: A | Discharge status: Home / Self Care | Payer: Medicare Other | Source: Ambulatory Visit | Attending: Orthopaedic Surgery | Admitting: Orthopaedic Surgery

## 2019-09-27 DIAGNOSIS — G8929 Other chronic pain: Secondary | ICD-10-CM | POA: Diagnosis present

## 2019-09-27 DIAGNOSIS — M25552 Pain in left hip: Secondary | ICD-10-CM | POA: Diagnosis present

## 2019-10-15 ENCOUNTER — Ambulatory Visit (INDEPENDENT_AMBULATORY_CARE_PROVIDER_SITE_OTHER): Payer: Medicare Other | Admitting: Orthopaedic Surgery

## 2019-10-15 ENCOUNTER — Other Ambulatory Visit: Payer: Self-pay

## 2019-10-15 ENCOUNTER — Encounter: Payer: Self-pay | Admitting: Orthopaedic Surgery

## 2019-10-15 VITALS — BP 157/99 | HR 78 | Ht 75.0 in | Wt 236.0 lb

## 2019-10-15 DIAGNOSIS — G8929 Other chronic pain: Secondary | ICD-10-CM

## 2019-10-15 DIAGNOSIS — F17210 Nicotine dependence, cigarettes, uncomplicated: Secondary | ICD-10-CM

## 2019-10-15 DIAGNOSIS — M25552 Pain in left hip: Secondary | ICD-10-CM | POA: Diagnosis not present

## 2019-10-15 NOTE — Patient Instructions (Signed)
Steps to Quit Smoking Smoking tobacco is the leading cause of preventable death. It can affect almost every organ in the body. Smoking puts you and people around you at risk for many serious, long-lasting (chronic) diseases. Quitting smoking can be hard, but it is one of the best things that you can do for your health. It is never too late to quit. How do I get ready to quit? When you decide to quit smoking, make a plan to help you succeed. Before you quit:  Pick a date to quit. Set a date within the next 2 weeks to give you time to prepare.  Write down the reasons why you are quitting. Keep this list in places where you will see it often.  Tell your family, friends, and co-workers that you are quitting. Their support is important.  Talk with your doctor about the choices that may help you quit.  Find out if your health insurance will pay for these treatments.  Know the people, places, things, and activities that make you want to smoke (triggers). Avoid them. What first steps can I take to quit smoking?  Throw away all cigarettes at home, at work, and in your car.  Throw away the things that you use when you smoke, such as ashtrays and lighters.  Clean your car. Make sure to empty the ashtray.  Clean your home, including curtains and carpets. What can I do to help me quit smoking? Talk with your doctor about taking medicines and seeing a counselor at the same time. You are more likely to succeed when you do both.  If you are pregnant or breastfeeding, talk with your doctor about counseling or other ways to quit smoking. Do not take medicine to help you quit smoking unless your doctor tells you to do so. To quit smoking: Quit right away  Quit smoking totally, instead of slowly cutting back on how much you smoke over a period of time.  Go to counseling. You are more likely to quit if you go to counseling sessions regularly. Take medicine You may take medicines to help you quit. Some  medicines need a prescription, and some you can buy over-the-counter. Some medicines may contain a drug called nicotine to replace the nicotine in cigarettes. Medicines may:  Help you to stop having the desire to smoke (cravings).  Help to stop the problems that come when you stop smoking (withdrawal symptoms). Your doctor may ask you to use:  Nicotine patches, gum, or lozenges.  Nicotine inhalers or sprays.  Non-nicotine medicine that is taken by mouth. Find resources Find resources and other ways to help you quit smoking and remain smoke-free after you quit. These resources are most helpful when you use them often. They include:  Online chats with a counselor.  Phone quitlines.  Printed self-help materials.  Support groups or group counseling.  Text messaging programs.  Mobile phone apps. Use apps on your mobile phone or tablet that can help you stick to your quit plan. There are many free apps for mobile phones and tablets as well as websites. Examples include Quit Guide from the CDC and smokefree.gov  What things can I do to make it easier to quit?   Talk to your family and friends. Ask them to support and encourage you.  Call a phone quitline (1-800-QUIT-NOW), reach out to support groups, or work with a counselor.  Ask people who smoke to not smoke around you.  Avoid places that make you want to smoke,   such as: ? Bars. ? Parties. ? Smoke-break areas at work.  Spend time with people who do not smoke.  Lower the stress in your life. Stress can make you want to smoke. Try these things to help your stress: ? Getting regular exercise. ? Doing deep-breathing exercises. ? Doing yoga. ? Meditating. ? Doing a body scan. To do this, close your eyes, focus on one area of your body at a time from head to toe. Notice which parts of your body are tense. Try to relax the muscles in those areas. How will I feel when I quit smoking? Day 1 to 3 weeks Within the first 24 hours,  you may start to have some problems that come from quitting tobacco. These problems are very bad 2-3 days after you quit, but they do not often last for more than 2-3 weeks. You may get these symptoms:  Mood swings.  Feeling restless, nervous, angry, or annoyed.  Trouble concentrating.  Dizziness.  Strong desire for high-sugar foods and nicotine.  Weight gain.  Trouble pooping (constipation).  Feeling like you may vomit (nausea).  Coughing or a sore throat.  Changes in how the medicines that you take for other issues work in your body.  Depression.  Trouble sleeping (insomnia). Week 3 and afterward After the first 2-3 weeks of quitting, you may start to notice more positive results, such as:  Better sense of smell and taste.  Less coughing and sore throat.  Slower heart rate.  Lower blood pressure.  Clearer skin.  Better breathing.  Fewer sick days. Quitting smoking can be hard. Do not give up if you fail the first time. Some people need to try a few times before they succeed. Do your best to stick to your quit plan, and talk with your doctor if you have any questions or concerns. Summary  Smoking tobacco is the leading cause of preventable death. Quitting smoking can be hard, but it is one of the best things that you can do for your health.  When you decide to quit smoking, make a plan to help you succeed.  Quit smoking right away, not slowly over a period of time.  When you start quitting, seek help from your doctor, family, or friends. This information is not intended to replace advice given to you by your health care provider. Make sure you discuss any questions you have with your health care provider. Document Released: 09/24/2009 Document Revised: 02/15/2019 Document Reviewed: 02/16/2019 Elsevier Patient Education  2020 [employer] Inc[employer]  

## 2019-10-15 NOTE — Progress Notes (Signed)
Patient Dave Williams, male DOB:20-Sep-1956, 63 y.o. [id number]  Chief Complaint  Patient presents with  . Hip Pain    left  . Results    review MRI     HPI  Dave Williams is a 63 y.o. male who has right hip pain.  He is better after being on the Naprosyn a while.  He had MRI of the right hip showing: IMPRESSION: Mild femoroacetabular joint osteoarthritis  Nondisplaced anterosuperior labral tear with a probable adjacent 1.4 cm paralabral cyst.  Mild insertional hamstrings tendinosis  I have explained the findings to him.  He will continue on the Naprosyn.   Body mass index is 29.5 kg/m.  ROS  Review of Systems  Constitutional: Positive for activity change.  Musculoskeletal: Positive for arthralgias, back pain and gait problem.  All other systems reviewed and are negative.   All other systems reviewed and are negative.  The following is a summary of the past history medically, past history surgically, known current medicines, social history and family history.  This information is gathered electronically by the computer from prior information and documentation.  I review this each visit and have found including this information at this point in the chart is beneficial and informative.    Past Medical History:  Diagnosis Date  . Hypertension     Past Surgical History:  Procedure Laterality Date  . BACK SURGERY    . KNEE ARTHROSCOPY    . PENILE CYST REMOVAL      Family History  Problem Relation Age of Onset  . Cancer Mother   . Hypertension Mother   . Heart failure Mother   . Cancer Father   . Hypertension Father   . Heart failure Father     Social History Social History   Tobacco Use  . Smoking status: Current Every Day Smoker    Packs/day: 0.50    Types: Cigarettes  . Smokeless tobacco: Never Used  Substance Use Topics  . Alcohol use: No  . Drug use: No    No Known Allergies  Current Outpatient Medications  Medication Sig  Dispense Refill  . cyclobenzaprine (FLEXERIL) 10 MG tablet Take 1 tablet (10 mg total) by mouth 2 (two) times daily as needed for muscle spasms. 12 tablet 0  . gabapentin (NEURONTIN) 100 MG capsule Take 100 mg by mouth 3 (three) times daily.    Marland Kitchen HYDROcodone-acetaminophen (NORCO/VICODIN) 5-325 MG tablet One tablet by mouth every six hours as needed for pain. 56 tablet 0  . naproxen (NAPROSYN) 500 MG tablet Take 1 tablet (500 mg total) by mouth 2 (two) times daily with a meal. 60 tablet 5   No current facility-administered medications for this visit.      Physical Exam  Blood pressure (!) 157/99, pulse 78, height 6\' 3"  (1.905 m), weight 236 lb (107 kg).  Constitutional: overall normal hygiene, normal nutrition, well developed, normal grooming, normal body habitus. Assistive device:none  Musculoskeletal: gait and station Limp none, muscle tone and strength are normal, no tremors or atrophy is present.  .  Neurological: coordination overall normal.  Deep tendon reflex/nerve stretch intact.  Sensation normal.  Cranial nerves II-XII intact.   Skin:   Normal overall no scars, lesions, ulcers or rashes. No psoriasis.  Psychiatric: Alert and oriented x 3.  Recent memory intact, remote memory unclear.  Normal mood and affect. Well groomed.  Good eye contact.  Cardiovascular: overall no swelling, no varicosities, no edema bilaterally, normal temperatures of the legs and arms, no  clubbing, cyanosis and good capillary refill.  Lymphatic: palpation is normal.  Right hip has minimal pain today, motion is good and he has no limp.  NV intact.  All other systems reviewed and are negative   The patient has been educated about the nature of the problem(s) and counseled on treatment options.  The patient appeared to understand what I have discussed and is in agreement with it.  Encounter Diagnoses  Name Primary?  . Chronic left hip pain Yes  . Nicotine dependence, cigarettes, uncomplicated      PLAN Call if any problems.  Precautions discussed.  Continue current medications.   Return to clinic 6 weeks   Electronically Signed Darreld Mclean, MD 11/3/20209:08 AM

## 2019-10-17 ENCOUNTER — Ambulatory Visit: Payer: Medicare Other | Admitting: Orthopaedic Surgery

## 2019-10-30 ENCOUNTER — Encounter: Payer: Self-pay | Admitting: Orthopaedic Surgery

## 2019-11-26 ENCOUNTER — Ambulatory Visit: Payer: Medicare Other | Admitting: Orthopaedic Surgery

## 2019-12-19 ENCOUNTER — Ambulatory Visit: Payer: Medicare Other | Admitting: Orthopaedic Surgery

## 2019-12-26 ENCOUNTER — Encounter: Payer: Self-pay | Admitting: Orthopaedic Surgery

## 2019-12-26 ENCOUNTER — Ambulatory Visit (INDEPENDENT_AMBULATORY_CARE_PROVIDER_SITE_OTHER): Payer: Medicare Other | Admitting: Orthopaedic Surgery

## 2019-12-26 ENCOUNTER — Other Ambulatory Visit: Payer: Self-pay

## 2019-12-26 VITALS — BP 155/105 | HR 81 | Temp 97.2°F | Ht 75.0 in | Wt 242.0 lb

## 2019-12-26 DIAGNOSIS — M25552 Pain in left hip: Secondary | ICD-10-CM | POA: Diagnosis not present

## 2019-12-26 DIAGNOSIS — F17210 Nicotine dependence, cigarettes, uncomplicated: Secondary | ICD-10-CM | POA: Diagnosis not present

## 2019-12-26 DIAGNOSIS — G8929 Other chronic pain: Secondary | ICD-10-CM | POA: Diagnosis not present

## 2019-12-26 MED ORDER — HYDROCODONE-ACETAMINOPHEN 5-325 MG PO TABS: ORAL_TABLET | ORAL | 0 refills | Status: DC

## 2019-12-26 MED ORDER — PREDNISONE 5 MG (21) PO TBPK: ORAL_TABLET | ORAL | 0 refills | Status: DC

## 2019-12-26 NOTE — Progress Notes (Signed)
Patient Dave Williams, male DOB:1956/02/12, 64 y.o. [id number]  Chief Complaint  Patient presents with  . Hip Pain    Chronic left hip pain.    HPI  Dave Williams is a 64 y.o. male who has left hip pain.  He has pain with increased activity and taking care of invalid at his house.  He has done fairly well with the Naprosyn and Tylenol.  He is out of his pain medicine.  He has no new trauma.   Body mass index is 30.25 kg/m.  ROS  Review of Systems  Constitutional: Positive for activity change.  Musculoskeletal: Positive for arthralgias, back pain and gait problem.  All other systems reviewed and are negative.   All other systems reviewed and are negative.  The following is a summary of the past history medically, past history surgically, known current medicines, social history and family history.  This information is gathered electronically by the computer from prior information and documentation.  I review this each visit and have found including this information at this point in the chart is beneficial and informative.    Past Medical History:  Diagnosis Date  . Hypertension     Past Surgical History:  Procedure Laterality Date  . BACK SURGERY    . KNEE ARTHROSCOPY    . PENILE CYST REMOVAL      Family History  Problem Relation Age of Onset  . Cancer Mother   . Hypertension Mother   . Heart failure Mother   . Cancer Father   . Hypertension Father   . Heart failure Father     Social History Social History   Tobacco Use  . Smoking status: Current Every Day Smoker    Packs/day: 0.50    Types: Cigarettes  . Smokeless tobacco: Never Used  Substance Use Topics  . Alcohol use: No  . Drug use: No    No Known Allergies  Current Outpatient Medications  Medication Sig Dispense Refill  . cyclobenzaprine (FLEXERIL) 10 MG tablet Take 1 tablet (10 mg total) by mouth 2 (two) times daily as needed for muscle spasms. 12 tablet 0  . gabapentin (NEURONTIN)  100 MG capsule Take 100 mg by mouth 3 (three) times daily.    Marland Kitchen HYDROcodone-acetaminophen (NORCO/VICODIN) 5-325 MG tablet One tablet by mouth every six hours as needed for pain. 56 tablet 0  . naproxen (NAPROSYN) 500 MG tablet Take 1 tablet (500 mg total) by mouth 2 (two) times daily with a meal. 60 tablet 5  . predniSONE (STERAPRED UNI-PAK 21 TAB) 5 MG (21) TBPK tablet Take 6 pills first day; 5 pills second day; 4 pills third day; 3 pills fourth day; 2 pills next day and 1 pill last day. 21 tablet 0   No current facility-administered medications for this visit.     Physical Exam  Blood pressure (!) 155/105, pulse 81, temperature (!) 97.2 F (36.2 C), height 6\' 3"  (1.905 m), weight 242 lb (109.8 kg).  Constitutional: overall normal hygiene, normal nutrition, well developed, normal grooming, normal body habitus. Assistive device:none  Musculoskeletal: gait and station Limp none, muscle tone and strength are normal, no tremors or atrophy is present.  .  Neurological: coordination overall normal.  Deep tendon reflex/nerve stretch intact.  Sensation normal.  Cranial nerves II-XII intact.   Skin:   Normal overall no scars, lesions, ulcers or rashes. No psoriasis.  Psychiatric: Alert and oriented x 3.  Recent memory intact, remote memory unclear.  Normal mood and affect. Well groomed.  Good eye contact.  Cardiovascular: overall no swelling, no varicosities, no edema bilaterally, normal temperatures of the legs and arms, no clubbing, cyanosis and good capillary refill.  Lymphatic: palpation is normal.  All other systems reviewed and are negative   The patient has been educated about the nature of the problem(s) and counseled on treatment options.  The patient appeared to understand what I have discussed and is in agreement with it.  Encounter Diagnoses  Name Primary?  . Chronic left hip pain Yes  . Nicotine dependence, cigarettes, uncomplicated     PLAN Call if any problems.   Precautions discussed.  Continue current medications.   Return to clinic 1 month   I will give prednisone dose pack.  I have refilled his pain medicine.  I have reviewed the [place] Controlled Substance Reporting System web site prior to prescribing narcotic medicine for this patient.   Electronically Signed Darreld Mclean, MD 1/14/202110:41 AM

## 2019-12-26 NOTE — Patient Instructions (Signed)
Stop the Naprosyn while you are on the Prednisone, then resume Naprosyn after the Prednisone is finished

## 2019-12-27 ENCOUNTER — Other Ambulatory Visit: Payer: Self-pay

## 2019-12-30 MED ORDER — HYDROCODONE-ACETAMINOPHEN 5-325 MG PO TABS: ORAL_TABLET | ORAL | 0 refills | Status: DC

## 2020-01-28 ENCOUNTER — Ambulatory Visit: Payer: Medicare Other | Admitting: Orthopaedic Surgery

## 2020-02-11 ENCOUNTER — Ambulatory Visit: Payer: Medicare Other | Admitting: Orthopaedic Surgery

## 2020-02-18 ENCOUNTER — Ambulatory Visit: Payer: Medicare Other | Admitting: Orthopaedic Surgery

## 2020-02-25 ENCOUNTER — Other Ambulatory Visit: Payer: Self-pay

## 2020-02-25 ENCOUNTER — Ambulatory Visit (INDEPENDENT_AMBULATORY_CARE_PROVIDER_SITE_OTHER): Payer: Medicare Other | Admitting: Orthopaedic Surgery

## 2020-02-25 ENCOUNTER — Encounter: Payer: Self-pay | Admitting: Orthopaedic Surgery

## 2020-02-25 VITALS — BP 143/91 | HR 80 | Ht 75.0 in | Wt 240.0 lb

## 2020-02-25 DIAGNOSIS — G8929 Other chronic pain: Secondary | ICD-10-CM

## 2020-02-25 DIAGNOSIS — F17210 Nicotine dependence, cigarettes, uncomplicated: Secondary | ICD-10-CM

## 2020-02-25 DIAGNOSIS — M25552 Pain in left hip: Secondary | ICD-10-CM | POA: Diagnosis not present

## 2020-02-25 MED ORDER — HYDROCODONE-ACETAMINOPHEN 5-325 MG PO TABS: ORAL_TABLET | ORAL | 0 refills | Status: DC

## 2020-02-25 NOTE — Progress Notes (Signed)
Patient PH:[id number] Dave Williams, male DOB:January 12, 1956, 64 y.o. [id number]  Chief Complaint  Patient presents with  . Hip Pain    Lt hip    HPI  Dave Williams is a 64 y.o. male who has left hip pain.  He has good and bad days.  Cold weather makes him worse and rainy days also.  He has no new trauma.  He is taking his medicine.   Body mass index is 30 kg/m.  ROS  Review of Systems  Constitutional: Positive for activity change.  Musculoskeletal: Positive for arthralgias, back pain and gait problem.  All other systems reviewed and are negative.   All other systems reviewed and are negative.  The following is a summary of the past history medically, past history surgically, known current medicines, social history and family history.  This information is gathered electronically by the computer from prior information and documentation.  I review this each visit and have found including this information at this point in the chart is beneficial and informative.    Past Medical History:  Diagnosis Date  . Hypertension     Past Surgical History:  Procedure Laterality Date  . BACK SURGERY    . KNEE ARTHROSCOPY    . PENILE CYST REMOVAL      Family History  Problem Relation Age of Onset  . Cancer Mother   . Hypertension Mother   . Heart failure Mother   . Cancer Father   . Hypertension Father   . Heart failure Father     Social History Social History   Tobacco Use  . Smoking status: Current Every Day Smoker    Packs/day: 0.50    Types: Cigarettes  . Smokeless tobacco: Never Used  Substance Use Topics  . Alcohol use: No  . Drug use: No    No Known Allergies  Current Outpatient Medications  Medication Sig Dispense Refill  . HYDROcodone-acetaminophen (NORCO/VICODIN) 5-325 MG tablet One tablet by mouth every six hours as needed for pain. 56 tablet 0  . naproxen (NAPROSYN) 500 MG tablet Take 1 tablet (500 mg total) by mouth 2 (two) times daily with a meal. 60 tablet  5  . cyclobenzaprine (FLEXERIL) 10 MG tablet Take 1 tablet (10 mg total) by mouth 2 (two) times daily as needed for muscle spasms. (Patient not taking: Reported on 02/25/2020) 12 tablet 0  . gabapentin (NEURONTIN) 100 MG capsule Take 100 mg by mouth 3 (three) times daily.    . predniSONE (STERAPRED UNI-PAK 21 TAB) 5 MG (21) TBPK tablet Take 6 pills first day; 5 pills second day; 4 pills third day; 3 pills fourth day; 2 pills next day and 1 pill last day. (Patient not taking: Reported on 02/25/2020) 21 tablet 0   No current facility-administered medications for this visit.     Physical Exam  Blood pressure (!) 143/91, pulse 80, height 6\' 3"  (1.905 m), weight 240 lb (108.9 kg).  Constitutional: overall normal hygiene, normal nutrition, well developed, normal grooming, normal body habitus. Assistive device:none  Musculoskeletal: gait and station Limp none, muscle tone and strength are normal, no tremors or atrophy is present.  .  Neurological: coordination overall normal.  Deep tendon reflex/nerve stretch intact.  Sensation normal.  Cranial nerves II-XII intact.   Skin:   Normal overall no scars, lesions, ulcers or rashes. No psoriasis.  Psychiatric: Alert and oriented x 3.  Recent memory intact, remote memory unclear.  Normal mood and affect. Well groomed.  Good eye contact.  Cardiovascular: overall  no swelling, no varicosities, no edema bilaterally, normal temperatures of the legs and arms, no clubbing, cyanosis and good capillary refill.  Lymphatic: palpation is normal.  All other systems reviewed and are negative   The patient has been educated about the nature of the problem(s) and counseled on treatment options.  The patient appeared to understand what I have discussed and is in agreement with it.  Encounter Diagnoses  Name Primary?  . Chronic left hip pain Yes  . Nicotine dependence, cigarettes, uncomplicated     PLAN Call if any problems.  Precautions discussed.  Continue  current medications.   Return to clinic 3 months   I have reviewed the [place] web site prior to prescribing narcotic medicine for this patient.   Electronically Signed Sanjuana Kava, MD 3/16/20212:22 PM

## 2020-04-29 ENCOUNTER — Telehealth: Payer: Self-pay | Admitting: Orthopaedic Surgery

## 2020-04-29 NOTE — Telephone Encounter (Signed)
Patient requests refill on Hydrocodone/Acetminophen 5-325  Mgs.  Qty  56  Sig: One tablet by mouth every six hours as needed for pain.  Patient states he uses [employer]

## 2020-04-30 MED ORDER — HYDROCODONE-ACETAMINOPHEN 5-325 MG PO TABS: ORAL_TABLET | ORAL | 0 refills | Status: DC

## 2020-05-26 ENCOUNTER — Ambulatory Visit: Payer: Medicare Other | Admitting: Orthopaedic Surgery

## 2020-06-02 ENCOUNTER — Ambulatory Visit: Payer: Medicare Other | Admitting: Orthopaedic Surgery

## 2020-06-11 ENCOUNTER — Ambulatory Visit: Payer: Medicare Other | Admitting: Orthopaedic Surgery

## 2020-06-16 ENCOUNTER — Ambulatory Visit: Payer: Medicare Other | Admitting: Orthopaedic Surgery

## 2020-06-18 ENCOUNTER — Encounter: Payer: Self-pay | Admitting: Orthopaedic Surgery

## 2020-06-18 ENCOUNTER — Other Ambulatory Visit: Payer: Self-pay

## 2020-06-18 ENCOUNTER — Ambulatory Visit (INDEPENDENT_AMBULATORY_CARE_PROVIDER_SITE_OTHER): Payer: Medicare Other | Admitting: Orthopaedic Surgery

## 2020-06-18 VITALS — BP 138/102 | HR 78 | Ht 75.5 in | Wt 242.1 lb

## 2020-06-18 DIAGNOSIS — G8929 Other chronic pain: Secondary | ICD-10-CM

## 2020-06-18 DIAGNOSIS — M25552 Pain in left hip: Secondary | ICD-10-CM | POA: Diagnosis not present

## 2020-06-18 DIAGNOSIS — F17210 Nicotine dependence, cigarettes, uncomplicated: Secondary | ICD-10-CM | POA: Diagnosis not present

## 2020-06-18 MED ORDER — NAPROXEN 500 MG PO TABS: 500.0000 mg | ORAL_TABLET | Freq: Two times a day (BID) | ORAL | 5 refills | Status: DC

## 2020-06-18 MED ORDER — TRAMADOL HCL 50 MG PO TABS: ORAL_TABLET | ORAL | 3 refills | Status: DC

## 2020-06-18 NOTE — Patient Instructions (Signed)

## 2020-06-18 NOTE — Progress Notes (Signed)
[name], male DOB:01/11/1956, 64 y.o. BSW:[id number]  Chief Complaint  Patient presents with  . Hip Pain    L/good & bad days/medicine helping    HPI  Dave Williams is a 64 y.o. male who has chronic pain of the left hip.  He has more pain with rainy weather and increased activity.  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  He needs refill on his Naprosyn which helps.  He has no new trauma.   Body mass index is 29.86 kg/m.  ROS  Review of Systems  Constitutional: Positive for activity change.  Musculoskeletal: Positive for arthralgias, back pain and gait problem.  All other systems reviewed and are negative.   All other systems reviewed and are negative.  The following is a summary of the past history medically, past history surgically, known current medicines, social history and family history.  This information is gathered electronically by the computer from prior information and documentation.  I review this each visit and have found including this information at this point in the chart is beneficial and informative.    Past Medical History:  Diagnosis Date  . Hypertension     Past Surgical History:  Procedure Laterality Date  . BACK SURGERY    . KNEE ARTHROSCOPY    . PENILE CYST REMOVAL      Family History  Problem Relation Age of Onset  . Cancer Mother   . Hypertension Mother   . Heart failure Mother   . Cancer Father   . Hypertension Father   . Heart failure Father     Social History Social History   Tobacco Use  . Smoking status: Current Every Day Smoker    Packs/day: 0.50    Types: Cigarettes  . Smokeless tobacco: Never Used  Vaping Use  . Vaping Use: Never used  Substance Use Topics  . Alcohol use: No  . Drug use: No    No Known Allergies  Current Outpatient Medications  Medication Sig Dispense Refill  . HYDROcodone-acetaminophen  (NORCO/VICODIN) 5-325 MG tablet One tablet by mouth every six hours as needed for pain. 56 tablet 0  . cyclobenzaprine (FLEXERIL) 10 MG tablet Take 1 tablet (10 mg total) by mouth 2 (two) times daily as needed for muscle spasms. (Patient not taking: Reported on 06/18/2020) 12 tablet 0  . gabapentin (NEURONTIN) 100 MG capsule Take 100 mg by mouth 3 (three) times daily. (Patient not taking: Reported on 06/18/2020)    . naproxen (NAPROSYN) 500 MG tablet Take 1 tablet (500 mg total) by mouth 2 (two) times daily with a meal. 60 tablet 5  . predniSONE (STERAPRED UNI-PAK 21 TAB) 5 MG (21) TBPK tablet Take 6 pills first day; 5 pills second day; 4 pills third day; 3 pills fourth day; 2 pills next day and 1 pill last day. (Patient not taking: Reported on 06/18/2020) 21 tablet 0  . traMADol (ULTRAM) 50 MG tablet One tablet every six hours as needed for pain. 40 tablet 3   No current facility-administered medications for this visit.     Physical Exam  Blood pressure (!) 138/102, pulse 78, height 6' 3.5" (1.918 m), weight 242 lb 2 oz (109.8 kg).  Constitutional: overall normal hygiene, normal nutrition, well developed, normal grooming, normal body habitus. Assistive device:none  Musculoskeletal: gait and station Limp left, muscle tone and strength are normal, no tremors or atrophy is present.  Marland Kitchen  Neurological: coordination overall normal.  Deep tendon reflex/nerve stretch intact.  Sensation normal.  Cranial nerves II-XII intact.   Skin:   Normal overall no scars, lesions, ulcers or rashes. No psoriasis.  Psychiatric: Alert and oriented x 3.  Recent memory intact, remote memory unclear.  Normal mood and affect. Well groomed.  Good eye contact.  Cardiovascular: overall no swelling, no varicosities, no edema bilaterally, normal temperatures of the legs and arms, no clubbing, cyanosis and good capillary refill.  Lymphatic: palpation is normal.  Left hip has decreased internal and external motion.  Limp left.   NV intact.  All other systems reviewed and are negative   The patient has been educated about the nature of the problem(s) and counseled on treatment options.  The patient appeared to understand what I have discussed and is in agreement with it.  Encounter Diagnoses  Name Primary?  . Chronic left hip pain Yes  . Nicotine dependence, cigarettes, uncomplicated     PLAN Call if any problems.  Precautions discussed.  Continue current medications. I will change to Ultram from the Norco.  Return to clinic 3 months   Electronically Signed Darreld Mclean, MD 7/8/202110:50 AM

## 2020-09-22 ENCOUNTER — Ambulatory Visit: Payer: Medicare Other | Admitting: Orthopaedic Surgery

## 2020-10-12 ENCOUNTER — Telehealth: Payer: Self-pay | Admitting: Radiology

## 2020-10-12 NOTE — Telephone Encounter (Signed)
Reasonable.  No problem.

## 2020-10-12 NOTE — Telephone Encounter (Signed)
Patient called, he says his hip is feeling much better since taking the naprosyn. Wants to hold on appt if you argree.  If you are ok with it and do not need to see him back in the office at this time, he will call us if needed going forward for an appt.

## 2020-12-12 DIAGNOSIS — I639 Cerebral infarction, unspecified: Secondary | ICD-10-CM

## 2020-12-12 HISTORY — DX: Cerebral infarction, unspecified: I63.9

## 2021-06-22 ENCOUNTER — Encounter: Payer: Self-pay | Admitting: Orthopaedic Surgery

## 2021-06-22 ENCOUNTER — Ambulatory Visit (INDEPENDENT_AMBULATORY_CARE_PROVIDER_SITE_OTHER): Payer: Medicare Other | Admitting: Orthopaedic Surgery

## 2021-06-22 ENCOUNTER — Other Ambulatory Visit: Payer: Self-pay

## 2021-06-22 VITALS — BP 156/106 | HR 82 | Ht 75.5 in | Wt 237.6 lb

## 2021-06-22 DIAGNOSIS — M25552 Pain in left hip: Secondary | ICD-10-CM | POA: Diagnosis not present

## 2021-06-22 DIAGNOSIS — G8929 Other chronic pain: Secondary | ICD-10-CM | POA: Diagnosis not present

## 2021-06-22 DIAGNOSIS — F17210 Nicotine dependence, cigarettes, uncomplicated: Secondary | ICD-10-CM

## 2021-06-22 MED ORDER — NAPROXEN 500 MG PO TABS: 500.0000 mg | ORAL_TABLET | Freq: Two times a day (BID) | ORAL | 5 refills | Status: DC

## 2021-06-22 MED ORDER — TRAMADOL HCL 50 MG PO TABS: ORAL_TABLET | ORAL | 3 refills | Status: DC

## 2021-06-22 NOTE — Progress Notes (Signed)
My hip hurts if I do not take the Naprosyn.  I am out of the medicine now.  He has left hip pain.  He has no trauma.  The Naprosyn helps.  He is out of his medicine.  He has no limp, no swelling.  ROM of the left hip is decreased but functional.  Gait is good.  NV intact.  Encounter Diagnoses  Name Primary?   Chronic left hip pain Yes   Nicotine dependence, cigarettes, uncomplicated    I will refill his Naprosyn and Ultram.  Return prn.  Call if any problem.  Precautions discussed.  Electronically Signed Darreld Mclean, MD 7/12/20223:27 PM

## 2021-11-07 ENCOUNTER — Emergency Department (HOSPITAL_COMMUNITY)
Admission: EM | Admit: 2021-11-07 | Discharge: 2021-11-07 | Disposition: A | Discharge status: Home / Self Care | Payer: Medicare Other | Attending: Emergency Medicine | Admitting: Emergency Medicine

## 2021-11-07 ENCOUNTER — Emergency Department (HOSPITAL_COMMUNITY): Payer: Medicare Other

## 2021-11-07 ENCOUNTER — Encounter (HOSPITAL_COMMUNITY): Payer: Self-pay | Admitting: Emergency Medicine

## 2021-11-07 ENCOUNTER — Other Ambulatory Visit: Payer: Self-pay

## 2021-11-07 DIAGNOSIS — Z79899 Other long term (current) drug therapy: Secondary | ICD-10-CM | POA: Insufficient documentation

## 2021-11-07 DIAGNOSIS — I10 Essential (primary) hypertension: Secondary | ICD-10-CM | POA: Insufficient documentation

## 2021-11-07 DIAGNOSIS — G510 Bell's palsy: Secondary | ICD-10-CM | POA: Insufficient documentation

## 2021-11-07 DIAGNOSIS — F17210 Nicotine dependence, cigarettes, uncomplicated: Secondary | ICD-10-CM | POA: Diagnosis not present

## 2021-11-07 DIAGNOSIS — R29810 Facial weakness: Secondary | ICD-10-CM | POA: Diagnosis present

## 2021-11-07 HISTORY — DX: Other chronic pain: G89.29

## 2021-11-07 LAB — BASIC METABOLIC PANEL
Anion gap: 8 (ref 5–15)
BUN: 14 mg/dL (ref 8–23)
CO2: 22 mmol/L (ref 22–32)
Calcium: 8.9 mg/dL (ref 8.9–10.3)
Chloride: 103 mmol/L (ref 98–111)
Creatinine, Ser: 1.17 mg/dL (ref 0.61–1.24)
GFR, Estimated: >60 mL/min (ref >60)
Glucose, Bld: 108 mg/dL — ABNORMAL HIGH (ref 70–99)
Potassium: 3.8 mmol/L (ref 3.5–5.1)
Sodium: 133 mmol/L — ABNORMAL LOW (ref 135–145)

## 2021-11-07 LAB — CBC
HCT: 45.5 % (ref 39.0–52.0)
Hemoglobin: 15.4 g/dL (ref 13.0–17.0)
MCH: 31.4 pg (ref 26.0–34.0)
MCHC: 33.8 g/dL (ref 30.0–36.0)
MCV: 92.7 fL (ref 80.0–100.0)
Platelets: 234 10*3/uL (ref 150–400)
RBC: 4.91 MIL/uL (ref 4.22–5.81)
RDW: 13.2 % (ref 11.5–15.5)
WBC: 8.2 10*3/uL (ref 4.0–10.5)
nRBC: 0 % (ref 0.0–0.2)

## 2021-11-07 MED ORDER — VALACYCLOVIR HCL 500 MG PO TABS
1000.0000 mg | ORAL_TABLET | Freq: Three times a day (TID) | ORAL | Status: DC
  Filled 2021-11-07: qty 2

## 2021-11-07 MED ORDER — PREDNISONE 20 MG PO TABS: 60.0000 mg | ORAL_TABLET | Freq: Every day | ORAL | 0 refills | Status: DC

## 2021-11-07 MED ORDER — PREDNISONE 10 MG PO TABS
60.0000 mg | ORAL_TABLET | Freq: Every day | ORAL | Status: DC
  Filled 2021-11-07: qty 1

## 2021-11-07 MED ORDER — ARTIFICIAL TEARS OPHTHALMIC OINT
TOPICAL_OINTMENT | OPHTHALMIC | Status: DC | PRN
  Filled 2021-11-07: qty 3.5

## 2021-11-07 MED ORDER — PREDNISONE 20 MG PO TABS
60.0000 mg | ORAL_TABLET | Freq: Every day | ORAL | Status: DC
  Administered 2021-11-07: 15:00:00 60 mg via ORAL
  Filled 2021-11-07: qty 3

## 2021-11-07 MED ORDER — VALACYCLOVIR HCL 1 G PO TABS: 1000.0000 mg | ORAL_TABLET | Freq: Three times a day (TID) | ORAL | 0 refills | Status: AC

## 2021-11-07 MED ORDER — VALACYCLOVIR HCL 1 G PO TABS: 1000.0000 mg | ORAL_TABLET | Freq: Three times a day (TID) | ORAL | 0 refills | Status: DC

## 2021-11-07 MED ORDER — AMLODIPINE BESYLATE 5 MG PO TABS: 5.0000 mg | ORAL_TABLET | Freq: Every day | ORAL | 0 refills | Status: DC

## 2021-11-07 MED ORDER — PREDNISONE 20 MG PO TABS: 60.0000 mg | ORAL_TABLET | Freq: Every day | ORAL | 0 refills | Status: AC

## 2021-11-07 NOTE — ED Notes (Signed)
Pt to CT

## 2021-11-07 NOTE — ED Provider Notes (Signed)
[facility] EMERGENCY DEPARTMENT Provider Note   CSN: [id number] Arrival date & time: 11/07/21  1331  An emergency department physician performed an initial assessment on this suspected stroke patient at 1332.  History Chief Complaint  Patient presents with   Facial Droop    Dave Williams is a 65 y.o. male.  HPI Patient presents reportedly as a code stroke.  Called by [facility].  Reportedly woke up this morning with facial droop.  Looked at the mirror and saw the drooping.  Drank some coffee and stated that it tasted a little weird and drool out of his mouth.  Difficulty closing his left eye.  Also has a dull headache.  Blood pressures been elevated recently.  States it is elevated due to dealing with a family member.  Also has been on high-dose naproxen for knee pain.  No chest pain.  No trouble breathing.  No trauma.  Does have a dull left-sided headache which is somewhat unusual for him. Also states he has a bad tooth on his left lower jaw.    Past Medical History:  Diagnosis Date   Chronic left hip pain    Hypertension     There are no problems to display for this patient.   Past Surgical History:  Procedure Laterality Date   BACK SURGERY     KNEE ARTHROSCOPY     PENILE CYST REMOVAL         Family History  Problem Relation Age of Onset   Cancer Mother    Hypertension Mother    Heart failure Mother    Cancer Father    Hypertension Father    Heart failure Father     Social History   Tobacco Use   Smoking status: Every Day    Packs/day: 1.00    Types: Cigarettes   Smokeless tobacco: Never  Vaping Use   Vaping Use: Never used  Substance Use Topics   Alcohol use: No   Drug use: No    Home Medications Prior to Admission medications   Medication Sig Start Date End Date Taking? Authorizing Provider  amLODipine (NORVASC) 5 MG tablet Take 1 tablet (5 mg total) by mouth daily. 11/07/21  Yes Benjiman Core, MD  naproxen (NAPROSYN) 500 MG  tablet Take 1 tablet (500 mg total) by mouth 2 (two) times daily with a meal. 06/22/21  Yes Darreld Mclean, MD  cyclobenzaprine (FLEXERIL) 10 MG tablet Take 1 tablet (10 mg total) by mouth 2 (two) times daily as needed for muscle spasms. Patient not taking: Reported on 11/07/2021 07/23/19   Blane Ohara, MD  gabapentin (NEURONTIN) 100 MG capsule Take 100 mg by mouth 3 (three) times daily. Patient not taking: Reported on 11/07/2021 03/13/18   [provider]  predniSONE (DELTASONE) 20 MG tablet Take 3 tablets (60 mg total) by mouth daily for 5 days. 11/10/21 11/15/21  Benjiman Core, MD  traMADol (ULTRAM) 50 MG tablet One tablet every six hours as needed for pain. Patient not taking: Reported on 11/07/2021 06/22/21   Darreld Mclean, MD  valACYclovir (VALTREX) 1000 MG tablet Take 1 tablet (1,000 mg total) by mouth 3 (three) times daily for 5 days. 11/10/21 11/15/21  Benjiman Core, MD    Allergies    Patient has no known allergies.  Review of Systems   Review of Systems  Constitutional:  Negative for appetite change.  HENT:  Positive for tinnitus. Negative for facial swelling and trouble swallowing.   Eyes:  Negative for pain.  Respiratory:  Negative for shortness of breath.   Cardiovascular:  Negative for chest pain and leg swelling.  Gastrointestinal:  Negative for abdominal pain.  Genitourinary:  Negative for flank pain.  Musculoskeletal:  Negative for back pain.  Neurological:  Positive for facial asymmetry and headaches. Negative for syncope and light-headedness.  Psychiatric/Behavioral:  Negative for confusion.    Physical Exam Updated Vital Signs BP (!) 152/98 (BP Location: Left Arm)   Pulse 68   Temp 98.7 F (37.1 C) (Oral)   Resp 12   Ht 6' 3.5" (1.918 m)   Wt 105.6 kg   SpO2 98%   BMI 28.70 kg/m   Physical Exam Vitals and nursing note reviewed.  HENT:     Head: Atraumatic.  Eyes:     Extraocular Movements: Extraocular movements intact.     Pupils:  Pupils are equal, round, and reactive to light.  Cardiovascular:     Rate and Rhythm: Regular rhythm.  Pulmonary:     Breath sounds: No wheezing or rhonchi.  Abdominal:     Tenderness: There is no abdominal tenderness.  Musculoskeletal:        General: No tenderness.     Cervical back: Neck supple.  Skin:    General: Skin is warm.     Capillary Refill: Capillary refill takes less than 2 seconds.  Neurological:     Mental Status: He is alert and oriented to person, place, and time.     Comments: Patient is playful and joking.  Does have left-sided facial droop.  Does have forehead involvement.  Difficulty closing the left eye the hallway.  Left lower facial droop also.  States tenderness in left ear which is chronic.  Equal hearing bilaterally.  Facial droop is visible at rest.    ED Results / Procedures / Treatments   Labs (all labs ordered are listed, but only abnormal results are displayed) Labs Reviewed  BASIC METABOLIC PANEL - Abnormal; Notable for the following components:      Result Value   Sodium 133 (*)    Glucose, Bld 108 (*)    All other components within normal limits  CBC    EKG EKG Interpretation  Date/Time:  Sunday November 07 2021 13:34:31 EST Ventricular Rate:  79 PR Interval:  140 QRS Duration: 99 QT Interval:  402 QTC Calculation: 461 R Axis:   -69 Text Interpretation: Sinus rhythm LAD, consider left anterior fascicular block Abnormal R-wave progression, late transition Confirmed by Benjiman Core [phone]) on 11/07/2021 2:25:32 PM  Radiology CT Head Wo Contrast  Result Date: 11/07/2021 CLINICAL DATA:  Headache.  Left-sided facial droop. EXAM: CT HEAD WITHOUT CONTRAST TECHNIQUE: Contiguous axial images were obtained from the base of the skull through the vertex without intravenous contrast. COMPARISON:  None. FINDINGS: Brain: Small focus of hypoattenuation adjacent to the right caudate nucleus head and just below the frontal horn of the right lateral  ventricle. 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 It measures 1 cm in size. No other evidence of an infarct. No parenchymal masses or mass effect. There are no extra-axial masses or abnormal fluid collections. Ventricles are normal in size and are symmetric. No intracranial hemorrhage. Vascular: No hyperdense vessel or unexpected calcification. Skull: Normal. Negative for fracture or focal lesion. Sinuses/Orbits: Globes and orbits are unremarkable. Mild ethmoid sinus mucosal thickening. Remaining visualized sinuses are clear. Other: None. IMPRESSION: 1. 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 However, this could be subacute. This could be further assessed, if desired clinically, with brain MRI. 2. No other intracranial abnormality. Electronically Signed   By: Amie Portland M.D.   On: 11/07/2021 14:26    Procedures Procedures   Medications Ordered in ED Medications  artificial tears (LACRILUBE) ophthalmic ointment (has no administration in time range)    ED Course  I have reviewed the triage vital signs and the nursing notes.  Pertinent labs & imaging results that were available during my care of the patient were reviewed by me and considered in my medical decision making (see chart for details).    MDM Rules/Calculators/A&P                           Patient with facial droop.  Woke with this morning.  Appears to be Bell's palsy.  He is forehead involvement.  Doubt stroke but has had a headache with hypertension.  Head CT done and showed potentially old stroke but not in a distribution that would cause a left-sided Bell's palsy.  Hypertension reportedly has been going on for a while.  Not on medication for really does not see a doctor.  States there is a lot of stress in his life.  Will have patient follow-up with PCP.  Treated with Lacri-Lube steroids and valacyclovir.  States he  could not get the medicines until Wednesday.  Given some pills to go by pharmacy. Final Clinical Impression(s) / ED Diagnoses Final diagnoses:  Bell's palsy    Rx / DC Orders ED Discharge Orders          Ordered    predniSONE (DELTASONE) 20 MG tablet  Daily        11/07/21 1510    valACYclovir (VALTREX) 1000 MG tablet  3 times daily        11/07/21 1510    predniSONE (DELTASONE) 20 MG tablet  Daily,   Status:  Discontinued        11/07/21 1426    valACYclovir (VALTREX) 1000 MG tablet  3 times daily,   Status:  Discontinued        11/07/21 1426    amLODipine (NORVASC) 5 MG tablet  Daily        11/07/21 1456             Benjiman Core, MD 11/07/21 1515

## 2021-11-07 NOTE — ED Triage Notes (Signed)
Pt arrives via [facility] as code stroke called in field. Woke up this morning with L sided ptosis and facial droop. Woke up around 730am this morning. LKW yesterday 930pm. Reports possible abscessed tooth on left upper back.

## 2021-11-22 ENCOUNTER — Telehealth: Payer: Self-pay

## 2021-11-22 MED ORDER — HYDROCODONE-ACETAMINOPHEN 5-325 MG PO TABS: ORAL_TABLET | ORAL | 0 refills | Status: DC

## 2021-11-22 NOTE — Telephone Encounter (Signed)
Patient left message on voicemail stating that he would like for you to up his Tramadol to something else. States the Tramadol isn't helping at all.  PATIENT USES [facility] IN [place]

## 2021-11-29 ENCOUNTER — Encounter: Payer: Self-pay | Admitting: Internal Medicine

## 2022-01-11 ENCOUNTER — Other Ambulatory Visit: Payer: Self-pay

## 2022-01-11 ENCOUNTER — Ambulatory Visit: Payer: Self-pay

## 2022-01-12 ENCOUNTER — Telehealth: Payer: Self-pay | Admitting: Orthopaedic Surgery

## 2022-01-12 NOTE — Telephone Encounter (Signed)
Patient called to request refill*  *patient was last seen for office visit 06/22/21* HYDROcodone-acetaminophen (NORCO/VICODIN) 5-325 MG tablet 56 tablet       [employer]

## 2022-01-12 NOTE — Telephone Encounter (Signed)
Called patient to notify per Dr Sanjuan Dame response to offer appointment, left message.

## 2022-01-18 ENCOUNTER — Ambulatory Visit: Payer: Medicare Other | Admitting: Orthopaedic Surgery

## 2022-01-25 ENCOUNTER — Encounter: Payer: Self-pay | Admitting: Orthopaedic Surgery

## 2022-01-25 ENCOUNTER — Other Ambulatory Visit: Payer: Self-pay

## 2022-01-25 ENCOUNTER — Ambulatory Visit (INDEPENDENT_AMBULATORY_CARE_PROVIDER_SITE_OTHER): Payer: Medicare Other | Admitting: Orthopaedic Surgery

## 2022-01-25 VITALS — BP 150/80 | HR 95 | Ht 75.5 in | Wt 234.8 lb

## 2022-01-25 DIAGNOSIS — G8929 Other chronic pain: Secondary | ICD-10-CM

## 2022-01-25 DIAGNOSIS — F17210 Nicotine dependence, cigarettes, uncomplicated: Secondary | ICD-10-CM | POA: Diagnosis not present

## 2022-01-25 DIAGNOSIS — M25552 Pain in left hip: Secondary | ICD-10-CM | POA: Diagnosis not present

## 2022-01-25 MED ORDER — HYDROCODONE-ACETAMINOPHEN 5-325 MG PO TABS: ORAL_TABLET | ORAL | 0 refills | Status: DC

## 2022-01-25 MED ORDER — NAPROXEN 500 MG PO TABS: 500.0000 mg | ORAL_TABLET | Freq: Two times a day (BID) | ORAL | 5 refills | Status: DC

## 2022-01-25 NOTE — Progress Notes (Signed)
My hip is a little worse.  His left hip pain has gotten worse with the cold weather.  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  He has no direct trauma.  He is taking his naprosyn, which he needs a refill.  He is active.  Left hip has decreased internal and external rotation, flexion to 90 abduction 35, adduction 20, extension 10.  NV intact.  No distal edema.  No trochanteric pain laterally.  Encounter Diagnoses  Name Primary?   Chronic left hip pain Yes   Nicotine dependence, cigarettes, uncomplicated    I will refill pain medicine and Naprosyn.  I have reviewed the [place] Controlled Substance Reporting System web site prior to prescribing narcotic medicine for this patient.  Call if any problem.  Precautions discussed.  Return as needed.  Electronically Signed Darreld Mclean, MD 2/14/20232:20 PM

## 2022-04-19 ENCOUNTER — Ambulatory Visit: Payer: Medicare Other | Admitting: Orthopaedic Surgery

## 2022-04-26 ENCOUNTER — Ambulatory Visit: Payer: Medicare Other | Admitting: Orthopaedic Surgery

## 2022-05-17 ENCOUNTER — Ambulatory Visit (INDEPENDENT_AMBULATORY_CARE_PROVIDER_SITE_OTHER): Payer: Medicare Other | Admitting: Orthopaedic Surgery

## 2022-05-17 ENCOUNTER — Encounter: Payer: Self-pay | Admitting: Orthopaedic Surgery

## 2022-05-17 VITALS — BP 126/80 | HR 80 | Ht 75.0 in | Wt 235.0 lb

## 2022-05-17 DIAGNOSIS — M25552 Pain in left hip: Secondary | ICD-10-CM

## 2022-05-17 DIAGNOSIS — M5442 Lumbago with sciatica, left side: Secondary | ICD-10-CM | POA: Diagnosis not present

## 2022-05-17 DIAGNOSIS — G8929 Other chronic pain: Secondary | ICD-10-CM | POA: Diagnosis not present

## 2022-05-17 DIAGNOSIS — F17210 Nicotine dependence, cigarettes, uncomplicated: Secondary | ICD-10-CM

## 2022-05-17 MED ORDER — HYDROCODONE-ACETAMINOPHEN 5-325 MG PO TABS: ORAL_TABLET | ORAL | 0 refills | Status: DC

## 2022-05-17 MED ORDER — NAPROXEN 500 MG PO TABS: 500.0000 mg | ORAL_TABLET | Freq: Two times a day (BID) | ORAL | 5 refills | Status: DC

## 2022-05-17 MED ORDER — CYCLOBENZAPRINE HCL 10 MG PO TABS: 10.0000 mg | ORAL_TABLET | Freq: Two times a day (BID) | ORAL | 1 refills | Status: DC | PRN

## 2022-05-17 NOTE — Progress Notes (Signed)
My back is hurting more.  He did some extra lifting and bending while helping out a friend last week.  He has more lower back pain, more on the left side with only slight radiation to the left.  He has no weakness, no numbness.  He is out of his pain medicine and Naprosyn which I will refill.  Spine/Pelvis examination:  Inspection:  Overall, sacoiliac joint benign and hips nontender; without crepitus or defects.   Thoracic spine inspection: Alignment normal without kyphosis present   Lumbar spine inspection:  Alignment  with normal lumbar lordosis, without scoliosis apparent.   Thoracic spine palpation:  without tenderness of spinal processes   Lumbar spine palpation: without tenderness of lumbar area; without tightness of lumbar muscles    Range of Motion:   Lumbar flexion, forward flexion is normal without pain or tenderness    Lumbar extension is full without pain or tenderness   Left lateral bend is normal without pain or tenderness   Right lateral bend is normal without pain or tenderness   Straight leg raising is normal  Strength & tone: normal   Stability overall normal stability Encounter Diagnoses  Name Primary?   Chronic left hip pain Yes   Nicotine dependence, cigarettes, uncomplicated    Chronic left-sided low back pain with left-sided sciatica    I will refill pain medicine, Naprosyn and Flexeril.  I have reviewed the [place] web site prior to prescribing narcotic medicine for this patient.  I will see as needed.  Call if any problem.  Precautions discussed.  Electronically Signed Sanjuana Kava, MD 6/6/20232:20 PM

## 2022-05-17 NOTE — Addendum Note (Signed)
Addended by: Willette Pa on: 05/17/2022 02:23 PM   Modules accepted: Orders

## 2022-09-12 ENCOUNTER — Telehealth: Payer: Self-pay | Admitting: Orthopaedic Surgery

## 2022-09-12 NOTE — Telephone Encounter (Signed)
Patient said he hurt his back with the lawnmower this weekend and wants Dr. Luna Glasgow to call him in some muscle relaxer's, he doesn't want to be see just medicine called in.  You can call him back at [phone]

## 2022-09-12 NOTE — Telephone Encounter (Signed)
Patient called back at 11:39 am and left another voicemail stating he needs his one time pain pills filled.  It hurts SO BAD he can't walk and come in to see [place].  He just want's someone to call him back [phone]

## 2022-09-13 MED ORDER — HYDROCODONE-ACETAMINOPHEN 5-325 MG PO TABS: ORAL_TABLET | ORAL | 0 refills | Status: DC

## 2022-09-13 NOTE — Telephone Encounter (Signed)
Patient called back and relays he is aware that Dr Luna Glasgow sent in a prescription for him.

## 2022-10-11 ENCOUNTER — Telehealth: Payer: Self-pay | Admitting: Radiology

## 2022-10-11 MED ORDER — HYDROCODONE-ACETAMINOPHEN 5-325 MG PO TABS: ORAL_TABLET | ORAL | 0 refills | Status: DC

## 2022-10-11 MED ORDER — CYCLOBENZAPRINE HCL 10 MG PO TABS: 10.0000 mg | ORAL_TABLET | Freq: Two times a day (BID) | ORAL | 1 refills | Status: DC | PRN

## 2022-10-11 NOTE — Telephone Encounter (Signed)
Patient called, asked for pain meds and muscle meds.  He is some better, but still has moments when it hurts.  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

## 2022-11-10 ENCOUNTER — Telehealth: Payer: Self-pay | Admitting: Orthopaedic Surgery

## 2022-11-10 NOTE — Telephone Encounter (Signed)
Called patient to advise he will need to be seen before getting another refill after todays refill.

## 2023-02-09 ENCOUNTER — Encounter: Payer: Self-pay | Admitting: Radiology

## 2023-05-30 ENCOUNTER — Telehealth: Payer: Self-pay | Admitting: Orthopaedic Surgery

## 2023-05-30 MED ORDER — NAPROXEN 500 MG PO TABS: 500.0000 mg | ORAL_TABLET | Freq: Two times a day (BID) | ORAL | 5 refills | Status: DC

## 2023-05-30 NOTE — Telephone Encounter (Signed)
Dr. Sanjuan Dame pt - pt lvm stating that he needs a refill on Naproxen 500mg  to be sent to [facility].  He stated he hasn't been seen in a year and knows Dr. Hilda Lias will want to see him and stated that he wants to get a refill first then he'll schedule an appointment.  [phone]

## 2023-07-05 DIAGNOSIS — Z8679 Personal history of other diseases of the circulatory system: Secondary | ICD-10-CM | POA: Insufficient documentation

## 2023-07-05 DIAGNOSIS — I639 Cerebral infarction, unspecified: Secondary | ICD-10-CM | POA: Insufficient documentation

## 2023-07-05 DIAGNOSIS — E7800 Pure hypercholesterolemia, unspecified: Secondary | ICD-10-CM | POA: Insufficient documentation

## 2023-07-11 ENCOUNTER — Telehealth: Payer: Self-pay | Admitting: Orthopaedic Surgery

## 2023-07-11 NOTE — Telephone Encounter (Signed)
When he called in June he was told to make appointment had been a year since he was here  He is asking for Hydrocodone, I advised him need to see him Will you call him back to schedule, Thanks

## 2023-07-11 NOTE — Telephone Encounter (Signed)
Could be his hip, it was hard to understand him on his message.

## 2023-07-11 NOTE — Telephone Encounter (Signed)
DR. Hilda Lias   Patient called at 12:43 and left a voicemail stating his hands are hurting really bad and he wants to know if you can call in something for the pain.  He request a call back at [phone]

## 2023-07-12 ENCOUNTER — Telehealth: Payer: Self-pay | Admitting: Orthopaedic Surgery

## 2023-07-12 NOTE — Telephone Encounter (Signed)
Returned the patient's call, lvm for him to call me back

## 2023-08-02 ENCOUNTER — Encounter: Payer: Self-pay | Admitting: Orthopedic Surgery

## 2023-08-02 ENCOUNTER — Ambulatory Visit: Payer: Medicare HMO | Admitting: Orthopedic Surgery

## 2023-08-02 ENCOUNTER — Other Ambulatory Visit (INDEPENDENT_AMBULATORY_CARE_PROVIDER_SITE_OTHER): Payer: Medicare HMO

## 2023-08-02 VITALS — BP 141/88 | HR 76 | Ht 75.0 in | Wt 236.0 lb

## 2023-08-02 DIAGNOSIS — M25551 Pain in right hip: Secondary | ICD-10-CM

## 2023-08-02 MED ORDER — TRAMADOL HCL 50 MG PO TABS: 50.0000 mg | ORAL_TABLET | Freq: Three times a day (TID) | ORAL | 0 refills | Status: DC | PRN

## 2023-08-02 NOTE — Progress Notes (Signed)
New Patient Visit  Assessment: Dave Williams is a 67 y.o. male with the following: 1. Pain in right hip  Plan: Dave Williams has pain in the right groin.  Pain is recreated with internal and external rotation of the right hip.  Dave Williams had similar pains in the left hip before, and has an MRI documenting a labral tear.  It is possible that Dave Williams continues to have problems in the right hip, with a similar problem.  Radiographs of bilateral hips appear to be similar.  We discussed proceeding with an injection, versus medications.  Dave Williams would like to try medications.  I provided him with a prescription for tramadol.  Dave Williams will return to clinic if Dave Williams is interested in a right hip ultrasound-guided injection.  Follow-up: Return if symptoms worsen or fail to improve.  Subjective:  Chief Complaint  Patient presents with   Hip Pain    R hip pain for 1 mo. 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     History of Present Illness: Dave Williams is a 67 y.o. male who presents for evaluation of right hip pain.  Dave Williams is well-known to clinic.  Dave Williams has been evaluated by Dr. Hilda Lias for his left hip.  Dave Williams was previously on chronic narcotic therapy for his left hip.  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  Over the past month, Dave Williams has started develop similar type pains in the right hip.  Prior to that, no issues with the right hip.  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  Pain is in the anterior aspect of the hip and into the groin.  No numbness or tingling distally.  Dave Williams has been taking Tylenol, and occasionally ibuprofen.   Review of Systems: No fevers or chills No numbness or tingling No chest pain No shortness of breath No bowel or bladder dysfunction No GI distress No headaches   Medical History:  Past Medical History:  Diagnosis Date   Chronic left hip pain    Hypertension      Past Surgical History:  Procedure Laterality Date   BACK SURGERY     KNEE ARTHROSCOPY     PENILE CYST REMOVAL      Family History  Problem Relation Age of Onset   Cancer Mother    Hypertension Mother    Heart failure Mother    Cancer Father    Hypertension Father    Heart failure Father    Social History   Tobacco Use   Smoking status: Every Day    Current packs/day: 1.00    Types: Cigarettes   Smokeless tobacco: Never  Vaping Use   Vaping status: Never Used  Substance Use Topics   Alcohol use: No   Drug use: No    No Known Allergies  Current Meds  Medication Sig   traMADol (ULTRAM) 50 MG tablet Take 1 tablet (50 mg total) by mouth every 8 (eight) hours as needed.    Objective: BP (!) 141/88   Pulse 76   Ht 6\' 3"  (1.905 m)   Wt 236 lb (107 kg)   BMI 29.50 kg/m   Physical Exam:  General: Alert and oriented. and No acute distress. Gait: Right sided antalgic gait.  Evaluation of right hip demonstrates no deformity.  No redness.  No swelling.  Minimal tenderness to palpation over the greater trochanter laterally.  Mild tenderness palpation anterior.  Dave Williams tolerates flexion of the hip to 90 degrees.  Pain with internal rotation.  Pain with external rotation.  Positive FADIR.  IMAGING: I personally ordered and reviewed the following images  AP pelvis and right hip x-rays were obtained in clinic today.  Mild loss of joint space, with some small cysts within the acetabulum laterally.  Small osteophytes are appreciated.  No obvious deformity on the femoral head neck junction.  No bony lesions.  Impression: Right hip x-rays with mild loss of joint space   New Medications:  Meds ordered this encounter  Medications   traMADol (ULTRAM) 50 MG tablet    Sig: Take 1 tablet (50 mg total) by mouth every 8 (eight) hours as needed.    Dispense:  30 tablet    Refill:  0      Oliver Barre, MD  08/02/2023 1:57 PM

## 2023-08-22 ENCOUNTER — Telehealth: Payer: Self-pay | Admitting: Orthopedic Surgery

## 2023-08-22 NOTE — Telephone Encounter (Signed)
Patient called lvm at 8:29 am stating the RX you gave him traMADol (ULTRAM) 50 MG tablet  is not working for him and he wants something else called in for him.  He can't keep coming in and paying a copay.    Please call him back at [phone]

## 2023-08-22 NOTE — Telephone Encounter (Signed)
Patient called again yesterday at 1:53 pm lvm needs stronger pain medicine

## 2023-08-23 ENCOUNTER — Other Ambulatory Visit: Payer: Self-pay | Admitting: Orthopedic Surgery

## 2023-08-23 NOTE — Telephone Encounter (Signed)
Dr. Dallas Schimke pt - pt lvm requesting a refill on Tramadol 50mg  to be sent to [facility].

## 2023-08-23 NOTE — Telephone Encounter (Signed)
Left information in VM message with call back # for questions.

## 2023-08-24 MED ORDER — TRAMADOL HCL 50 MG PO TABS: 50.0000 mg | ORAL_TABLET | Freq: Three times a day (TID) | ORAL | 0 refills | Status: DC | PRN

## 2023-09-12 ENCOUNTER — Encounter: Payer: Self-pay | Admitting: Orthopaedic Surgery

## 2023-09-12 ENCOUNTER — Ambulatory Visit: Payer: Medicare HMO | Admitting: Orthopaedic Surgery

## 2023-09-12 VITALS — BP 162/100 | HR 71 | Ht 75.0 in | Wt 231.0 lb

## 2023-09-12 DIAGNOSIS — M25552 Pain in left hip: Secondary | ICD-10-CM | POA: Diagnosis not present

## 2023-09-12 DIAGNOSIS — G8929 Other chronic pain: Secondary | ICD-10-CM

## 2023-09-12 MED ORDER — HYDROCODONE-ACETAMINOPHEN 5-325 MG PO TABS: ORAL_TABLET | ORAL | 0 refills | Status: DC

## 2023-09-12 NOTE — Progress Notes (Signed)
My left hip is hurting more.  I saw him about a year ago for the left hip pain.  It has done well until recently.  He had right hip pain in August and had X-rays.  I have reviewed them.  He has more left hip pain but no paresthesias.  He has no trauma.  He is taking his Naprosyn. The tramadol did not help that much.  Left hip has decreased ROM, flexion 90, extension 5, internal 10, external 25, adduction full, abduction 20.  NV intact.  No distal edema.  Encounter Diagnosis  Name Primary?   Chronic left hip pain Yes   I will call in hydrocodone, it worked before.  I have reviewed the [place] Controlled Substance Reporting System web site prior to prescribing narcotic medicine for this patient.  Continue the Naprosyn.  He may need scan of hip if not improved.  Call if any problem.  Precautions discussed.  Return in three weeks.  Electronically Signed Darreld Mclean, MD 10/1/20242:11 PM

## 2023-10-03 ENCOUNTER — Ambulatory Visit: Payer: Medicare HMO | Admitting: Orthopaedic Surgery

## 2023-10-04 ENCOUNTER — Ambulatory Visit: Payer: Medicare HMO | Admitting: Orthopaedic Surgery

## 2023-10-04 ENCOUNTER — Encounter: Payer: Self-pay | Admitting: Orthopaedic Surgery

## 2023-10-04 VITALS — BP 144/81 | HR 74

## 2023-10-04 DIAGNOSIS — M25552 Pain in left hip: Secondary | ICD-10-CM

## 2023-10-04 DIAGNOSIS — G8929 Other chronic pain: Secondary | ICD-10-CM | POA: Diagnosis not present

## 2023-10-04 MED ORDER — HYDROCODONE-ACETAMINOPHEN 5-325 MG PO TABS: ORAL_TABLET | ORAL | 0 refills | Status: DC

## 2023-10-04 NOTE — Patient Instructions (Signed)
Note for:  No lifting more than 20 pounds.  No twisting or bending of back.

## 2023-10-04 NOTE — Progress Notes (Signed)
My hip is hurting.  He has pain in his hip and his lower back.  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  He is applying to get social services placement for her in nursing home.  He is constantly lifting her and managing her needs.  Hip has decreased ROM on the left.  Lower back has no spasm but is tight, ROM is good but tender, NV intact, Muscle tone and strength is normal.  Encounter Diagnosis  Name Primary?   Chronic left hip pain Yes   I have reviewed the [place] Controlled Substance Reporting System web site prior to prescribing narcotic medicine for this patient.  Return in one month.  Note given for no lifting more than 20 pounds, no bending or twisting of back.  Call if any problem.  Precautions discussed.  Electronically Signed Darreld Mclean, MD 10/23/20248:27 AM

## 2023-10-13 IMAGING — CT CT HEAD W/O CM
3 series · 16 of 47 positions shown, 19 images · non-contrast
Comparison: None.

CLINICAL DATA: Headache.  Left-sided facial droop.

EXAM:
CT HEAD WITHOUT CONTRAST
TECHNIQUE: Contiguous axial images were obtained from the base of the skull
through the vertex without intravenous contrast.

[Series 2: head w o · axial · 0.42mm/px · z∈[+37,+172]mm · 10 of 33 slices shown, 13 images]
[im 3/33  brain]
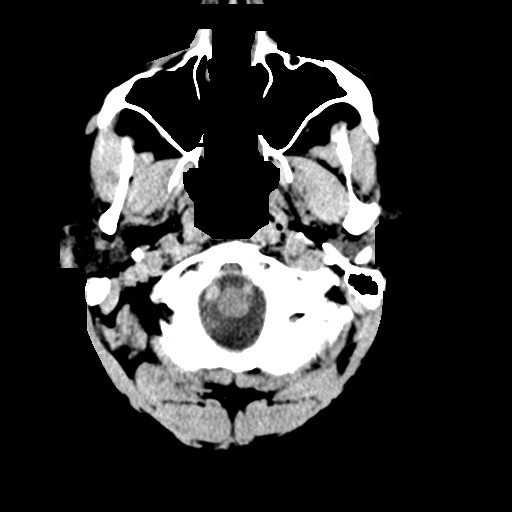
[im 3/33  bone]
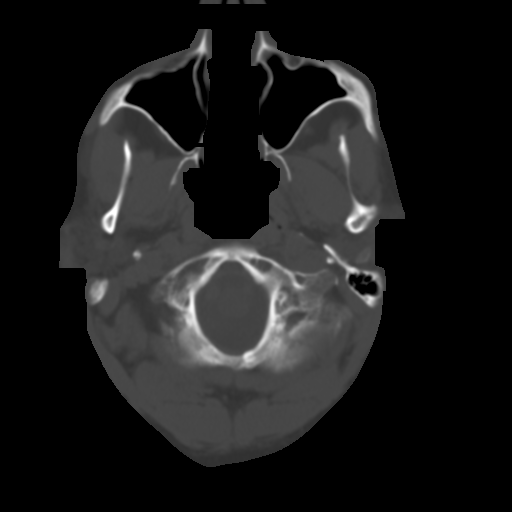
[im 6/33  brain]
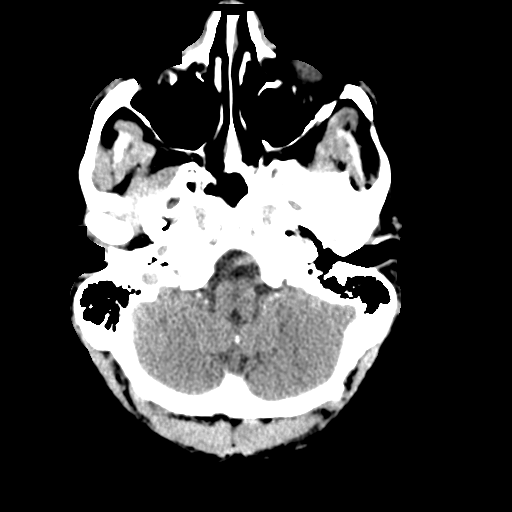
[im 9/33  brain]
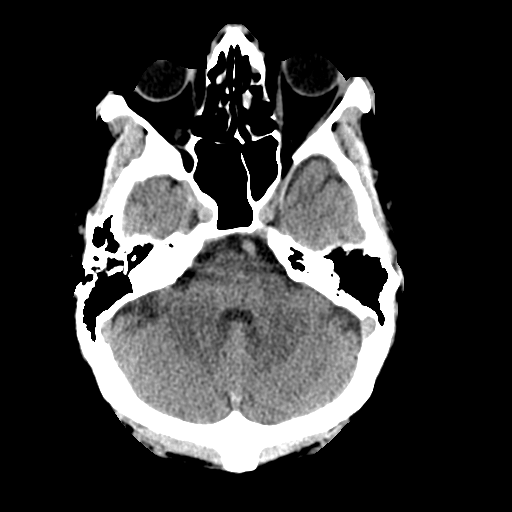
[im 12/33  brain]
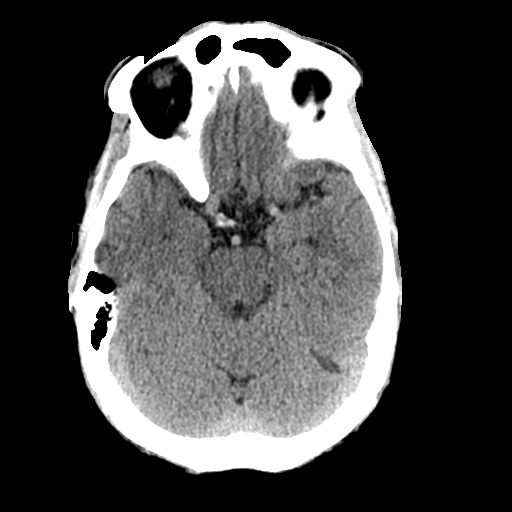
[im 15/33  brain]
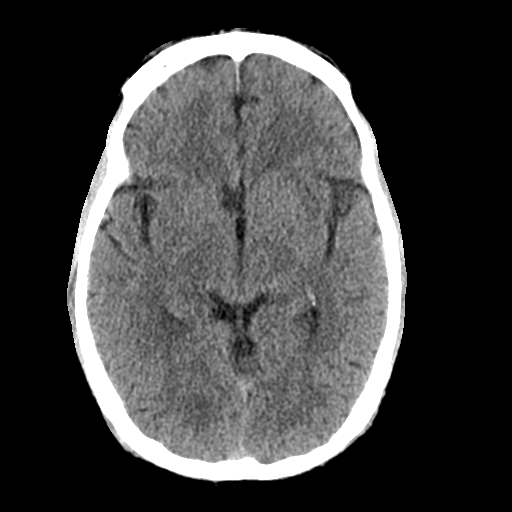
[im 15/33  bone]
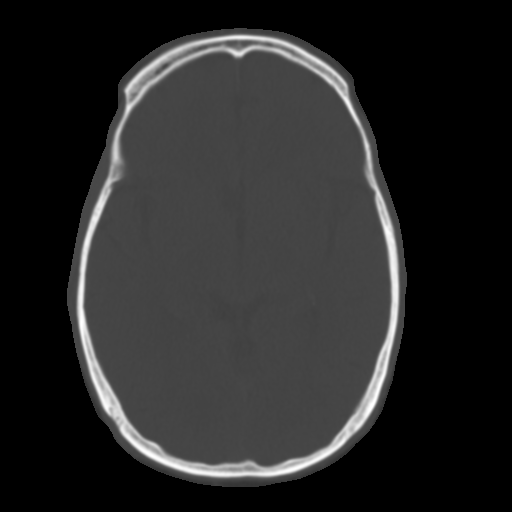
[im 18/33  brain]
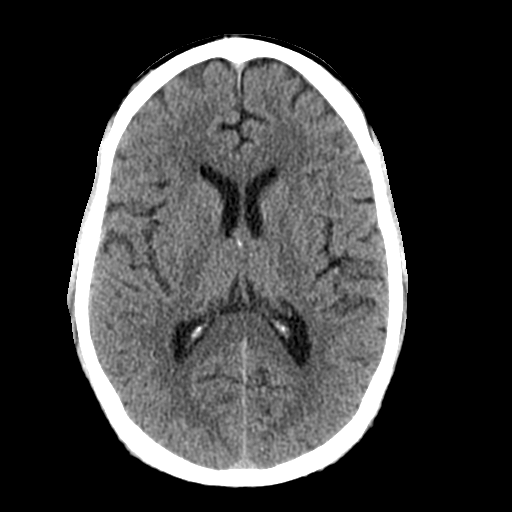
[im 21/33  brain]
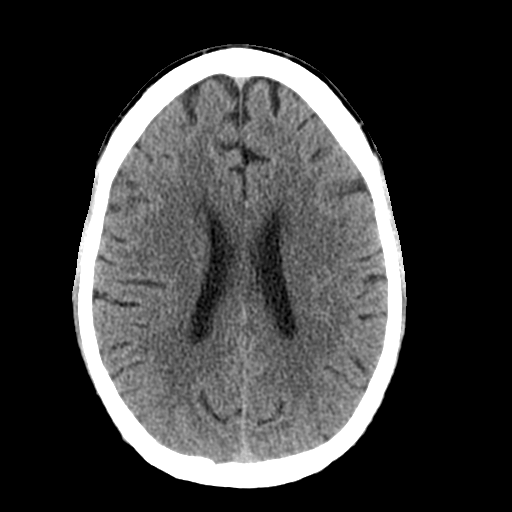
[im 25/33  brain]
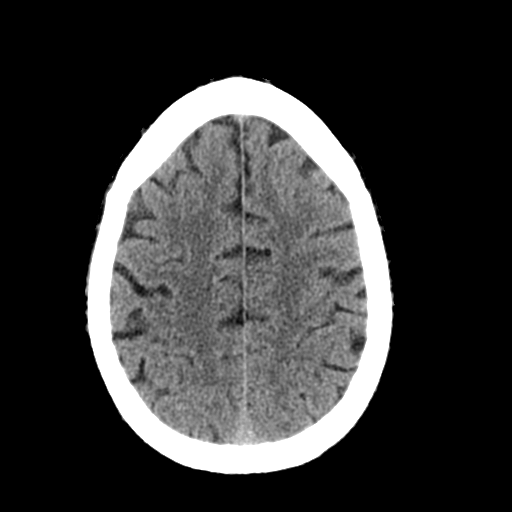
[im 27/33  brain]
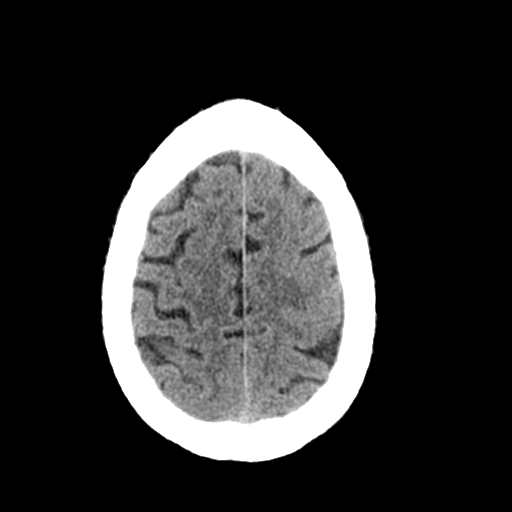
[im 27/33  bone]
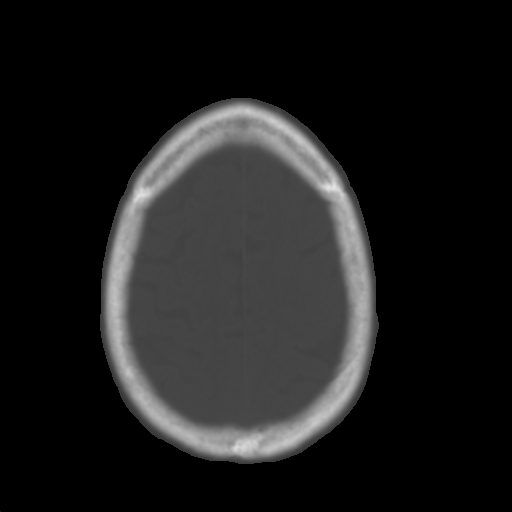
[im 30/33  brain]
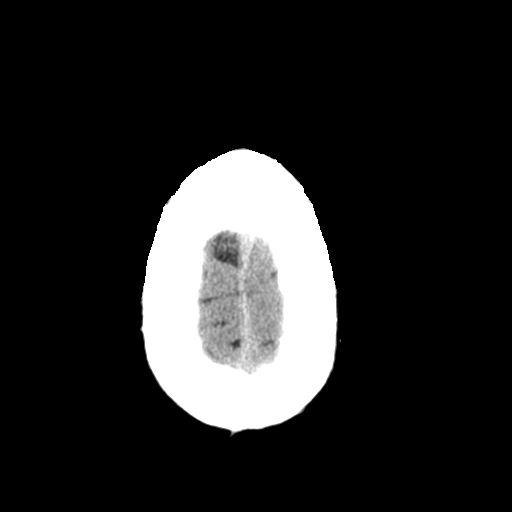

[Series 4: coronal soft · coronal · 0.34mm/px · 3 of 69 slices shown]
[im 23/69  brain]
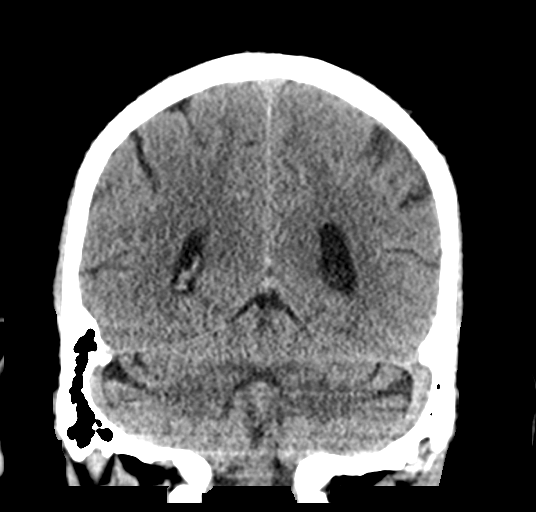
[im 31/69  brain]
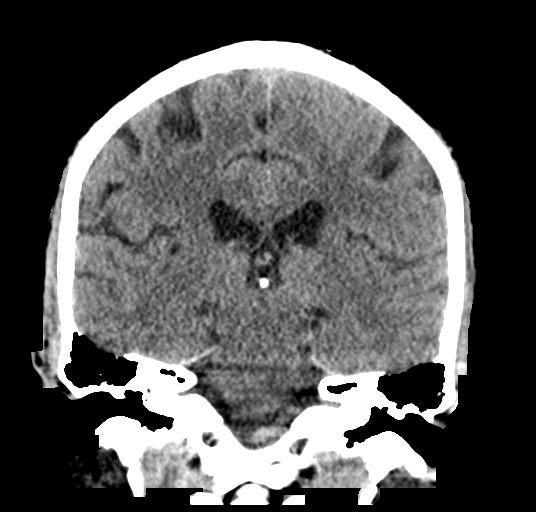
[im 38/69  brain]
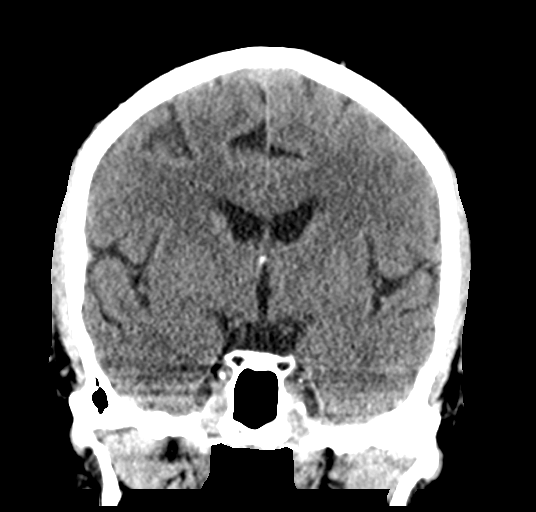

[Series 5: sagittal soft · sagittal · 0.37mm/px · 3 of 58 slices shown]
[im 20/58  brain]
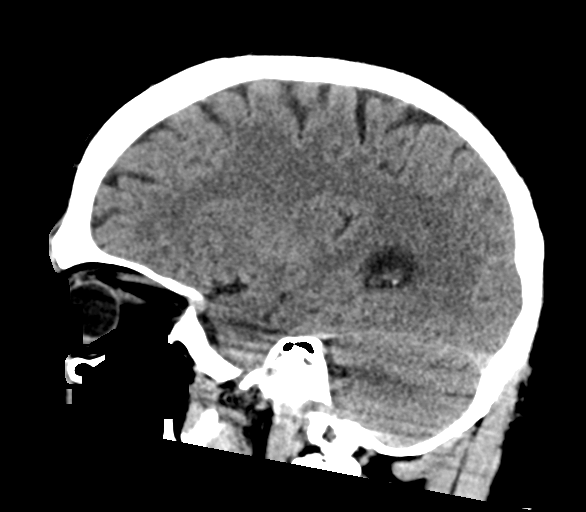
[im 29/58  brain]
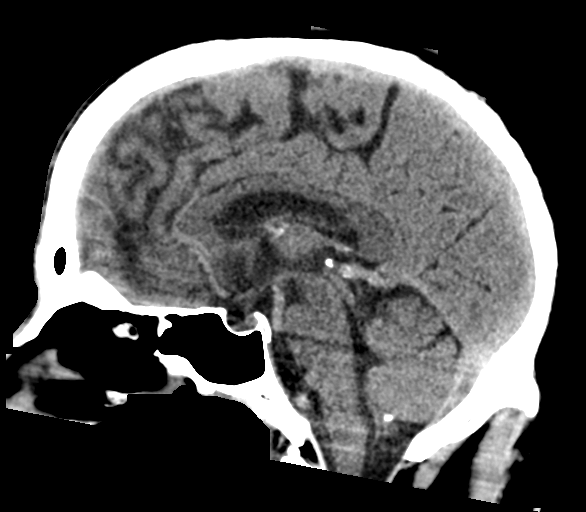
[im 39/58  brain]
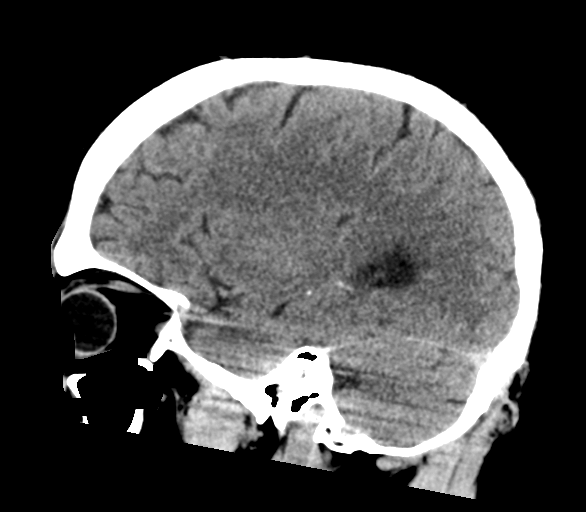

[16 of 47 positions shown; findings below may reference images not displayed]

FINDINGS: Brain: Small focus of hypoattenuation adjacent to the right caudate
nucleus head and just below the frontal horn of the right lateral
ventricle. This is likely an old lacunar infarct, but could be
subacute. It measures 1 cm in size.

No other evidence of an infarct. No parenchymal masses or mass
effect. There are no extra-axial masses or abnormal fluid
collections. Ventricles are normal in size and are symmetric.

No intracranial hemorrhage.

Vascular: No hyperdense vessel or unexpected calcification.

Skull: Normal. Negative for fracture or focal lesion.

Sinuses/Orbits: Globes and orbits are unremarkable. Mild ethmoid
sinus mucosal thickening. Remaining visualized sinuses are clear.

Other: None.
IMPRESSION: 1. Small well-defined focus of hypoattenuation adjacent to the right
caudate nucleus head felt most likely to reflect old lacunar
infarct. However, this could be subacute. This could be further
assessed, if desired clinically, with brain MRI.
2. No other intracranial abnormality.

## 2023-10-31 ENCOUNTER — Ambulatory Visit: Payer: Medicare HMO | Admitting: Orthopaedic Surgery

## 2023-11-01 ENCOUNTER — Ambulatory Visit: Payer: Medicare HMO | Admitting: Orthopaedic Surgery

## 2023-11-01 ENCOUNTER — Encounter: Payer: Self-pay | Admitting: Orthopaedic Surgery

## 2023-11-01 VITALS — BP 138/85 | HR 75 | Ht 75.0 in | Wt 230.0 lb

## 2023-11-01 DIAGNOSIS — M25552 Pain in left hip: Secondary | ICD-10-CM | POA: Diagnosis not present

## 2023-11-01 DIAGNOSIS — G8929 Other chronic pain: Secondary | ICD-10-CM | POA: Diagnosis not present

## 2023-11-01 DIAGNOSIS — M5442 Lumbago with sciatica, left side: Secondary | ICD-10-CM | POA: Diagnosis not present

## 2023-11-01 MED ORDER — HYDROCODONE-ACETAMINOPHEN 5-325 MG PO TABS: ORAL_TABLET | ORAL | 0 refills | Status: DC

## 2023-11-01 NOTE — Progress Notes (Signed)
I hurt at times.  He has good and bad days with his hip and lower back.  He has no new trauma.  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    Lower back has good ROM, muscle tone and strength normal, ROM is good, NV intact, no distal edema.  Encounter Diagnoses  Name Primary?   Chronic left hip pain Yes   Chronic left-sided low back pain with left-sided sciatica    Return in three months.  I have reviewed the [place] Controlled Substance Reporting System web site prior to prescribing narcotic medicine for this patient.  Call if any problem.  Precautions discussed.  Electronically Signed Darreld Mclean, MD 11/20/20242:20 PM

## 2023-11-03 ENCOUNTER — Encounter (HOSPITAL_COMMUNITY): Payer: Self-pay

## 2023-11-03 ENCOUNTER — Other Ambulatory Visit: Payer: Self-pay

## 2023-11-03 ENCOUNTER — Emergency Department (HOSPITAL_COMMUNITY): Payer: Medicare HMO

## 2023-11-03 ENCOUNTER — Emergency Department (HOSPITAL_COMMUNITY)
Admission: EM | Admit: 2023-11-03 | Discharge: 2023-11-03 | Disposition: A | Discharge status: Home / Self Care | Payer: Medicare HMO | Attending: Emergency Medicine | Admitting: Emergency Medicine

## 2023-11-03 DIAGNOSIS — F17200 Nicotine dependence, unspecified, uncomplicated: Secondary | ICD-10-CM | POA: Diagnosis not present

## 2023-11-03 DIAGNOSIS — R0789 Other chest pain: Secondary | ICD-10-CM

## 2023-11-03 DIAGNOSIS — I10 Essential (primary) hypertension: Secondary | ICD-10-CM | POA: Diagnosis not present

## 2023-11-03 DIAGNOSIS — Z79899 Other long term (current) drug therapy: Secondary | ICD-10-CM | POA: Diagnosis not present

## 2023-11-03 DIAGNOSIS — R911 Solitary pulmonary nodule: Secondary | ICD-10-CM | POA: Insufficient documentation

## 2023-11-03 DIAGNOSIS — Z20822 Contact with and (suspected) exposure to covid-19: Secondary | ICD-10-CM | POA: Diagnosis not present

## 2023-11-03 LAB — CBC WITH DIFFERENTIAL/PLATELET
Abs Immature Granulocytes: 0.04 10*3/uL (ref 0.00–0.07)
Basophils Absolute: 0.1 10*3/uL (ref 0.0–0.1)
Basophils Relative: 1 %
Eosinophils Absolute: 0.2 10*3/uL (ref 0.0–0.5)
Eosinophils Relative: 2 %
HCT: 46.2 % (ref 39.0–52.0)
Hemoglobin: 15.4 g/dL (ref 13.0–17.0)
Immature Granulocytes: 0 %
Lymphocytes Relative: 36 %
Lymphs Abs: 3.4 10*3/uL (ref 0.7–4.0)
MCH: 31.5 pg (ref 26.0–34.0)
MCHC: 33.3 g/dL (ref 30.0–36.0)
MCV: 94.5 fL (ref 80.0–100.0)
Monocytes Absolute: 0.7 10*3/uL (ref 0.1–1.0)
Monocytes Relative: 7 %
Neutro Abs: 5 10*3/uL (ref 1.7–7.7)
Neutrophils Relative %: 54 %
Platelets: 251 10*3/uL (ref 150–400)
RBC: 4.89 MIL/uL (ref 4.22–5.81)
RDW: 13.2 % (ref 11.5–15.5)
WBC: 9.4 10*3/uL (ref 4.0–10.5)
nRBC: 0 % (ref 0.0–0.2)

## 2023-11-03 LAB — TROPONIN I (HIGH SENSITIVITY)
Troponin I (High Sensitivity): 7 ng/L (ref <18)
Troponin I (High Sensitivity): 7 ng/L (ref <18)

## 2023-11-03 LAB — COMPREHENSIVE METABOLIC PANEL
ALT: 21 U/L (ref 0–44)
AST: 21 U/L (ref 15–41)
Albumin: 4.3 g/dL (ref 3.5–5.0)
Alkaline Phosphatase: 47 U/L (ref 38–126)
Anion gap: 7 (ref 5–15)
BUN: 14 mg/dL (ref 8–23)
CO2: 25 mmol/L (ref 22–32)
Calcium: 9.1 mg/dL (ref 8.9–10.3)
Chloride: 103 mmol/L (ref 98–111)
Creatinine, Ser: 1.1 mg/dL (ref 0.61–1.24)
GFR, Estimated: >60 mL/min (ref >60)
Glucose, Bld: 105 mg/dL — ABNORMAL HIGH (ref 70–99)
Potassium: 4.3 mmol/L (ref 3.5–5.1)
Sodium: 135 mmol/L (ref 135–145)
Total Bilirubin: 1.1 mg/dL (ref <1.2)
Total Protein: 7.8 g/dL (ref 6.5–8.1)

## 2023-11-03 LAB — RESP PANEL BY RT-PCR (RSV, FLU A&B, COVID)  RVPGX2
Influenza A by PCR: NEGATIVE
Influenza B by PCR: NEGATIVE
Resp Syncytial Virus by PCR: NEGATIVE
SARS Coronavirus 2 by RT PCR: NEGATIVE

## 2023-11-03 NOTE — ED Triage Notes (Signed)
Received COVID flu and pneumonia shot 1 week ago  SOB on exertion since Pain in chest that radiates up to LEFT jaw started 3 days after vaccine  Has been taking 4 baby ASA daily and chest pain goes away.

## 2023-11-03 NOTE — Discharge Instructions (Addendum)
Understand that you do not want a wait and do the CT of chest today.  This nodular density possibly could be a cancer process of following up very important.  From a heart standpoint everything checked out okay.  Also make an appointment to follow-up with cardiology.  And would recommend you follow back up with your primary care doctor to have a CT scan of your chest with contrast to evaluate this nodular density in more detail.  Return for any new or worse symptoms.  Return for any chest pain lasting 20 minutes or longer.

## 2023-11-03 NOTE — ED Provider Notes (Addendum)
[place] EMERGENCY DEPARTMENT AT [facility] Provider Note   CSN: [id number] Arrival date & time: 11/03/23  1211     History  Chief Complaint  Patient presents with   Chest Pain    Dave Williams is a 67 y.o. male.  Patient with complaint of chest pain on and off for about a week.  The pain radiates to the left jaw left shoulder.  It lasted about 5 minutes at most.  It occurs several times throughout the day.  Today's event initially occurred at 10:00's morning.  Has had no pain recently.  No shortness of breath associated with it.  No history of heart problems but does have a history of hypertension high cholesterol.  Patient is known to have past medical history hypertension chronic left hip pain.  Patient is an everyday smoker.  Oxygen saturation 100%.  Heart rates 75 temp 98.7 respirations 20.  Patient never had any history of chest pain in the past.  Chest pain is left anterior and radiation as described above.       Home Medications Prior to Admission medications   Medication Sig Start Date End Date Taking? Authorizing Provider  amLODipine (NORVASC) 5 MG tablet Take 1 tablet (5 mg total) by mouth daily. 11/07/21   Benjiman Core, MD  atorvastatin (LIPITOR) 20 MG tablet Take 1 tablet by mouth every morning.    [provider]  cyclobenzaprine (FLEXERIL) 10 MG tablet TAKE ONE TABLET BY MOUTH TWICE DAILY AS NEEDED FOR MUSCLE SPASMS 11/10/22   Darreld Mclean, MD  HYDROcodone-acetaminophen (NORCO/VICODIN) 5-325 MG tablet One tablet by mouth every six hours as needed for pain. 11/01/23   Darreld Mclean, MD  naproxen (NAPROSYN) 500 MG tablet Take 1 tablet (500 mg total) by mouth 2 (two) times daily with a meal. 05/30/23   Darreld Mclean, MD      Allergies    Patient has no known allergies.    Review of Systems   Review of Systems  Constitutional:  Negative for chills and fever.  HENT:  Negative for ear pain and sore throat.   Eyes:  Negative for pain  and visual disturbance.  Respiratory:  Negative for cough and shortness of breath.   Cardiovascular:  Positive for chest pain. Negative for palpitations.  Gastrointestinal:  Negative for abdominal pain and vomiting.  Genitourinary:  Negative for dysuria and hematuria.  Musculoskeletal:  Negative for arthralgias and back pain.  Skin:  Negative for color change and rash.  Neurological:  Negative for seizures and syncope.  All other systems reviewed and are negative.   Physical Exam Updated Vital Signs BP 129/86 (BP Location: Right Arm)   Pulse 69   Temp 98.3 F (36.8 C) (Oral)   Resp 13   Ht 1.905 m (6\' 3" )   Wt 104.3 kg   SpO2 98%   BMI 28.75 kg/m  Physical Exam Vitals and nursing note reviewed.  Constitutional:      General: He is not in acute distress.    Appearance: Normal appearance. He is well-developed.  HENT:     Head: Normocephalic and atraumatic.  Eyes:     Extraocular Movements: Extraocular movements intact.     Conjunctiva/sclera: Conjunctivae normal.     Pupils: Pupils are equal, round, and reactive to light.  Cardiovascular:     Rate and Rhythm: Normal rate and regular rhythm.     Heart sounds: No murmur heard. Pulmonary:     Effort: Pulmonary effort is normal. No respiratory distress.  Breath sounds: Normal breath sounds. No wheezing, rhonchi or rales.  Chest:     Chest wall: No tenderness.  Abdominal:     Palpations: Abdomen is soft.     Tenderness: There is no abdominal tenderness.  Musculoskeletal:        General: No swelling.     Cervical back: Normal range of motion and neck supple.  Skin:    General: Skin is warm and dry.     Capillary Refill: Capillary refill takes less than 2 seconds.  Neurological:     General: No focal deficit present.     Mental Status: He is alert and oriented to person, place, and time.  Psychiatric:        Mood and Affect: Mood normal.     ED Results / Procedures / Treatments   Labs (all labs ordered are  listed, but only abnormal results are displayed) Labs Reviewed  COMPREHENSIVE METABOLIC PANEL - Abnormal; Notable for the following components:      Result Value   Glucose, Bld 105 (*)    All other components within normal limits  RESP PANEL BY RT-PCR (RSV, FLU A&B, COVID)  RVPGX2  CBC WITH DIFFERENTIAL/PLATELET  TROPONIN I (HIGH SENSITIVITY)  TROPONIN I (HIGH SENSITIVITY)    EKG EKG Interpretation Date/Time:  Friday November 03 2023 12:22:41 EST Ventricular Rate:  76 PR Interval:  152 QRS Duration:  92 QT Interval:  418 QTC Calculation: 470 R Axis:   -30  Text Interpretation: Normal sinus rhythm Left axis deviation Abnormal ECG When compared with ECG of 07-Nov-2021 13:34, No significant change since last tracing Confirmed by Meridee Score [phone]) on 11/03/2023 2:33:09 PM  Radiology DG Chest 2 View  Result Date: 11/03/2023 CLINICAL DATA:  Chest pain. EXAM: CHEST - 2 VIEW COMPARISON:  None Available. FINDINGS: The heart size and mediastinal contours are within normal limits. Right lung is clear. Possible nodular density seen in left parahilar region. The visualized skeletal structures are unremarkable. IMPRESSION: Possible left perihilar nodular density. CT scan of the chest with intravenous contrast is recommended for further evaluation. Electronically Signed   By: Lupita Raider M.D.   On: 11/03/2023 13:17    Procedures Procedures    Medications Ordered in ED Medications - No data to display  ED Course/ Medical Decision Making/ A&P                                 Medical Decision Making Amount and/or Complexity of Data Reviewed Labs: ordered. Radiology: ordered. ECG/medicine tests: ordered.   Troponins normal x 2.  Both of them 7.  Respiratory panel negative.  Comprehensive metabolic panel without any abnormalities liver function test are normal.  CBC white count 9.4 hemoglobin 15.4 and platelets 251.  Chest x-ray possible left perihilar nodular density CT scan  of the chest with intravenous contrast is recommended for further evaluation. The fact the patient is a smoker.  Will avoid and do CT chest with contrast here.  No concerns from an acute cardiac event based on today's workup.  No concerns for pulmonary embolus.  Patient has no shortness of breath not tachycardic not hypoxic.  Offered to have patient just get the CT scan and would follow-up results.  But patient says he is absolutely has to go home.  Follow-up with his primary care doctor in [place].  He understands that the density could represent a cancerous process.  They are recommending  CT chest with contrast for further evaluation.  Patient from a cardiac standpoint is checking out okay.  Patient will also make an appointment to follow-up with cardiology outpatient.  Final Clinical Impression(s) / ED Diagnoses Final diagnoses:  Atypical chest pain  Pulmonary nodule, left    Rx / DC Orders ED Discharge Orders     None         Vanetta Mulders, MD 11/03/23 Sharlee Blew, MD 11/03/23 1740

## 2023-11-20 ENCOUNTER — Telehealth: Payer: Self-pay | Admitting: Orthopaedic Surgery

## 2023-11-20 MED ORDER — HYDROCODONE-ACETAMINOPHEN 5-325 MG PO TABS: ORAL_TABLET | ORAL | 0 refills | Status: DC

## 2023-11-20 NOTE — Telephone Encounter (Signed)
Dr. Sanjuan Dame pt - pt lvm requesting a refill on Hydrocodone 5-325 to be sent to [facility].

## 2023-11-27 ENCOUNTER — Encounter: Payer: Self-pay | Admitting: Cardiovascular Disease

## 2023-11-27 ENCOUNTER — Ambulatory Visit: Payer: Medicare HMO | Attending: Cardiovascular Disease | Admitting: Cardiovascular Disease

## 2023-11-27 ENCOUNTER — Other Ambulatory Visit: Payer: Self-pay

## 2023-11-27 VITALS — BP 120/80 | HR 73 | Ht 75.5 in | Wt 229.0 lb

## 2023-11-27 DIAGNOSIS — Z8679 Personal history of other diseases of the circulatory system: Secondary | ICD-10-CM | POA: Diagnosis not present

## 2023-11-27 DIAGNOSIS — Z01812 Encounter for preprocedural laboratory examination: Secondary | ICD-10-CM

## 2023-11-27 DIAGNOSIS — E7800 Pure hypercholesterolemia, unspecified: Secondary | ICD-10-CM | POA: Diagnosis not present

## 2023-11-27 DIAGNOSIS — I639 Cerebral infarction, unspecified: Secondary | ICD-10-CM

## 2023-11-27 DIAGNOSIS — R079 Chest pain, unspecified: Secondary | ICD-10-CM

## 2023-11-27 MED ORDER — ASPIRIN 81 MG PO TBEC: 81.0000 mg | DELAYED_RELEASE_TABLET | Freq: Every day | ORAL | Status: DC

## 2023-11-27 NOTE — Assessment & Plan Note (Signed)
History of hyperlipidemia on statin therapy followed by his PCP 

## 2023-11-27 NOTE — H&P (View-Only) (Signed)
11/27/2023 Dave Williams   09-30-1956  [id number]  Primary Physician Oneal Grout, FNP Primary Cardiologist: Runell Gess MD Dave Williams, [place]  HPI:  Dave Williams is a 67 y.o. Thin appearing divorced Caucasian male with no children who is been disabled because of back issues and was referred for chest pain by the emergency room where he was seen 11/04/2023.  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  His risk factors include over 50 pack years tobacco abuse currently smoking 1/2 pack/day, treated hypertension and hyperlipidemia.  His father had CAD and had stents notably CABG.  He has never had a heart attack but apparently had a TIA 3 years ago.  He has had onset of chest pain approxi-6 weeks ago that occurs several times a day and radiates to his jaw, shoulder and left upper extremity.  He was given sublingual nitroglycerin in the ambulance which relieved his chest pain.  He has a first responder and EMT as well.   No outpatient medications have been marked as taking for the 11/27/23 encounter (Office Visit) with Runell Gess, MD.     No Known Allergies  Social History   Socioeconomic History   Marital status: Single    Spouse name: Not on file   Number of children: Not on file   Years of education: Not on file   Highest education level: Not on file  Occupational History   Not on file  Tobacco Use   Smoking status: Every Day    Current packs/day: 1.00    Types: Cigarettes   Smokeless tobacco: Never  Vaping Use   Vaping status: Never Used  Substance and Sexual Activity   Alcohol use: No   Drug use: No   Sexual activity: Not on file  Other Topics Concern   Not on file  Social History Narrative   Not on file   Social Drivers of Health   Financial Resource Strain: Not on file  Food Insecurity: Not on file  Transportation Needs: Not on file  Physical Activity: Not on file  Stress: Not on file  Social Connections: Not on file   Intimate Partner Violence: Not on file     Review of Systems: General: negative for chills, fever, night sweats or weight changes.  Cardiovascular: negative for chest pain, dyspnea on exertion, edema, orthopnea, palpitations, paroxysmal nocturnal dyspnea or shortness of breath Dermatological: negative for rash Respiratory: negative for cough or wheezing Urologic: negative for hematuria Abdominal: negative for nausea, vomiting, diarrhea, bright red blood per rectum, melena, or hematemesis Neurologic: negative for visual changes, syncope, or dizziness All other systems reviewed and are otherwise negative except as noted above.    Blood pressure 120/80, pulse 73, height 6' 3.5" (1.918 m), weight 229 lb (103.9 kg).  General appearance: alert and no distress Neck: no adenopathy, no carotid bruit, no JVD, supple, symmetrical, trachea midline, and thyroid not enlarged, symmetric, no tenderness/mass/nodules Lungs: clear to auscultation bilaterally Heart: regular rate and rhythm, S1, S2 normal, no murmur, click, rub or gallop Extremities: extremities normal, atraumatic, no cyanosis or edema Pulses: 2+ and symmetric Skin: Skin color, texture, turgor normal. No rashes or lesions Neurologic: Grossly normal  EKG EKG Interpretation Date/Time:  Monday November 27 2023 13:43:33 EST Ventricular Rate:  73 PR Interval:  150 QRS Duration:  90 QT Interval:  416 QTC Calculation: 458 R Axis:   -38  Text Interpretation: Normal sinus rhythm Left axis deviation When compared with ECG  of 03-Nov-2023 12:22, No significant change was found Confirmed by Nanetta Batty [phone]) on 11/27/2023 1:49:48 PM    ASSESSMENT AND PLAN:   History of hypertension History of essential hypertension her blood pressure measured today at 120/80.  He is on amlodipine.  Hypercholesterolemia History of hyperlipidemia on statin therapy followed by his PCP.  Chest pain of uncertain etiology [name] was recently in  the ER 11/04/2023 for chest pain and again a week ago in [place] with chest pain.  He has risk factors including family history, tobacco abuse, treated hypertension and hyperlipidemia.  The pain occurs multiple times a day rate and radiates to his jaw and left upper extremity.  He has noticed that it is relieved with topical nitroglycerin.  I believe given his risk factors we should bypass noninvasive testing and go directly to Accardi catheterization.   I have reviewed the risks, indications, and alternatives to cardiac catheterization, possible angioplasty, and stenting with the patient. Risks include but are not limited to bleeding, infection, vascular injury, stroke, myocardial infection, arrhythmia, kidney injury, radiation-related injury in the case of prolonged fluoroscopy use, emergency cardiac surgery, and death. The patient understands the risks of serious complication is 1-2 in 1000 with diagnostic cardiac cath and 1-2% or less with angioplasty/stenting.       Runell Gess MD FACP,FACC,FAHA, [facility] 11/27/2023 2:04 PM

## 2023-11-27 NOTE — Patient Instructions (Signed)
Medication Instructions:  NO CHANGES  TAKE aspirin 81mg  tonight and tomorrow morning  *If you need a refill on your cardiac medications before your next appointment, please call your pharmacy*   Lab Work: CBC and BMET today   If you have labs (blood work) drawn today and your tests are completely normal, you will receive your results only by: MyChart Message (if you have MyChart) OR A paper copy in the mail If you have any lab test that is abnormal or we need to change your treatment, we will call you to review the results.   Testing/Procedures: Heart Cath at [facility] tomorrow 11/28/23   Follow-Up: At [facility], you and your health needs are our priority.  As part of our continuing mission to provide you with exceptional heart care, we have created designated Provider Care Teams.  These Care Teams include your primary Cardiologist (physician) and Advanced Practice Providers (APPs -  Physician Assistants and Nurse Practitioners) who all work together to provide you with the care you need, when you need it.  We recommend signing up for the patient portal called "MyChart".  Sign up information is provided on this After Visit Summary.  MyChart is used to connect with patients for Virtual Visits (Telemedicine).  Patients are able to view lab/test results, encounter notes, upcoming appointments, etc.  Non-urgent messages can be sent to your provider as well.   To learn more about what you can do with MyChart, go to [email or web address].    Your next appointment:    3-4 weeks with Dr. Allyson Sabal or NP/PA Other Instructions       Cardiac/Peripheral Catheterization   You are scheduled for a Cardiac Catheterization on Tuesday, December 17 with Dr. Peter [place].  1. Please arrive at the [facility] (Main Entrance A) at [facility]: [address] [place], [place] [address] at 1:00 PM (This time is 2 hour(s) before your procedure to ensure your preparation).    Free valet parking service is available. You will check in at ADMITTING. The support person will be asked to wait in the waiting room.  It is OK to have someone drop you off and come back when you are ready to be discharged.        Special note: Every effort is made to have your procedure done on time. Please understand that emergencies sometimes delay scheduled procedures.  2. Diet: Do not eat solid foods after midnight.  You may have clear liquids until 5 AM the day of the procedure.  3. Labs: You will need to have blood drawn today (12/16)  4. Medication instructions in preparation for your procedure:  On the morning of your procedure, take Aspirin 81 mg and any morning medicines NOT listed above.  You may use sips of water.  5. Plan to go home the same day, you will only stay overnight if medically necessary. 6. You MUST have a responsible adult to drive you home. 7. An adult MUST be with you the first 24 hours after you arrive home. 8. Bring a current list of your medications, and the last time and date medication taken. 9. Bring ID and current insurance cards. 10.Please wear clothes that are easy to get on and off and wear slip-on shoes.  Thank you for allowing [place] to care for you!   -- [facility]

## 2023-11-27 NOTE — Assessment & Plan Note (Signed)
Mr. Derogatis was recently in the ER 11/04/2023 for chest pain and again a week ago in [place] with chest pain.  He has risk factors including family history, tobacco abuse, treated hypertension and hyperlipidemia.  The pain occurs multiple times a day rate and radiates to his jaw and left upper extremity.  He has noticed that it is relieved with topical nitroglycerin.  I believe given his risk factors we should bypass noninvasive testing and go directly to Accardi catheterization.   I have reviewed the risks, indications, and alternatives to cardiac catheterization, possible angioplasty, and stenting with the patient. Risks include but are not limited to bleeding, infection, vascular injury, stroke, myocardial infection, arrhythmia, kidney injury, radiation-related injury in the case of prolonged fluoroscopy use, emergency cardiac surgery, and death. The patient understands the risks of serious complication is 1-2 in 1000 with diagnostic cardiac cath and 1-2% or less with angioplasty/stenting.

## 2023-11-27 NOTE — Progress Notes (Signed)
11/27/2023 Dave Williams   09-30-1956  [id number]  Primary Physician Oneal Grout, FNP Primary Cardiologist: Runell Gess MD Nicholes Calamity, [place]  HPI:  Dave Williams is a 67 y.o. Thin appearing divorced Caucasian male with no children who is been disabled because of back issues and was referred for chest pain by the emergency room where he was seen 11/04/2023.  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  His risk factors include over 50 pack years tobacco abuse currently smoking 1/2 pack/day, treated hypertension and hyperlipidemia.  His father had CAD and had stents notably CABG.  He has never had a heart attack but apparently had a TIA 3 years ago.  He has had onset of chest pain approxi-6 weeks ago that occurs several times a day and radiates to his jaw, shoulder and left upper extremity.  He was given sublingual nitroglycerin in the ambulance which relieved his chest pain.  He has a first responder and EMT as well.   No outpatient medications have been marked as taking for the 11/27/23 encounter (Office Visit) with Runell Gess, MD.     No Known Allergies  Social History   Socioeconomic History   Marital status: Single    Spouse name: Not on file   Number of children: Not on file   Years of education: Not on file   Highest education level: Not on file  Occupational History   Not on file  Tobacco Use   Smoking status: Every Day    Current packs/day: 1.00    Types: Cigarettes   Smokeless tobacco: Never  Vaping Use   Vaping status: Never Used  Substance and Sexual Activity   Alcohol use: No   Drug use: No   Sexual activity: Not on file  Other Topics Concern   Not on file  Social History Narrative   Not on file   Social Drivers of Health   Financial Resource Strain: Not on file  Food Insecurity: Not on file  Transportation Needs: Not on file  Physical Activity: Not on file  Stress: Not on file  Social Connections: Not on file   Intimate Partner Violence: Not on file     Review of Systems: General: negative for chills, fever, night sweats or weight changes.  Cardiovascular: negative for chest pain, dyspnea on exertion, edema, orthopnea, palpitations, paroxysmal nocturnal dyspnea or shortness of breath Dermatological: negative for rash Respiratory: negative for cough or wheezing Urologic: negative for hematuria Abdominal: negative for nausea, vomiting, diarrhea, bright red blood per rectum, melena, or hematemesis Neurologic: negative for visual changes, syncope, or dizziness All other systems reviewed and are otherwise negative except as noted above.    Blood pressure 120/80, pulse 73, height 6' 3.5" (1.918 m), weight 229 lb (103.9 kg).  General appearance: alert and no distress Neck: no adenopathy, no carotid bruit, no JVD, supple, symmetrical, trachea midline, and thyroid not enlarged, symmetric, no tenderness/mass/nodules Lungs: clear to auscultation bilaterally Heart: regular rate and rhythm, S1, S2 normal, no murmur, click, rub or gallop Extremities: extremities normal, atraumatic, no cyanosis or edema Pulses: 2+ and symmetric Skin: Skin color, texture, turgor normal. No rashes or lesions Neurologic: Grossly normal  EKG EKG Interpretation Date/Time:  Monday November 27 2023 13:43:33 EST Ventricular Rate:  73 PR Interval:  150 QRS Duration:  90 QT Interval:  416 QTC Calculation: 458 R Axis:   -38  Text Interpretation: Normal sinus rhythm Left axis deviation When compared with ECG  of 03-Nov-2023 12:22, No significant change was found Confirmed by Nanetta Batty [phone]) on 11/27/2023 1:49:48 PM    ASSESSMENT AND PLAN:   History of hypertension History of essential hypertension her blood pressure measured today at 120/80.  He is on amlodipine.  Hypercholesterolemia History of hyperlipidemia on statin therapy followed by his PCP.  Chest pain of uncertain etiology [name] was recently in  the ER 11/04/2023 for chest pain and again a week ago in [place] with chest pain.  He has risk factors including family history, tobacco abuse, treated hypertension and hyperlipidemia.  The pain occurs multiple times a day rate and radiates to his jaw and left upper extremity.  He has noticed that it is relieved with topical nitroglycerin.  I believe given his risk factors we should bypass noninvasive testing and go directly to Accardi catheterization.   I have reviewed the risks, indications, and alternatives to cardiac catheterization, possible angioplasty, and stenting with the patient. Risks include but are not limited to bleeding, infection, vascular injury, stroke, myocardial infection, arrhythmia, kidney injury, radiation-related injury in the case of prolonged fluoroscopy use, emergency cardiac surgery, and death. The patient understands the risks of serious complication is 1-2 in 1000 with diagnostic cardiac cath and 1-2% or less with angioplasty/stenting.       Runell Gess MD FACP,FACC,FAHA, [facility] 11/27/2023 2:04 PM

## 2023-11-27 NOTE — Assessment & Plan Note (Signed)
History of essential hypertension her blood pressure measured today at 120/80.  He is on amlodipine.

## 2023-11-28 ENCOUNTER — Other Ambulatory Visit: Payer: Self-pay

## 2023-11-28 ENCOUNTER — Ambulatory Visit (HOSPITAL_COMMUNITY)
Admission: RE | Admit: 2023-11-28 | Discharge: 2023-11-28 | Disposition: A | Discharge status: Home / Self Care | Payer: Medicare HMO | Attending: Cardiology | Admitting: Cardiology

## 2023-11-28 ENCOUNTER — Encounter (HOSPITAL_COMMUNITY)
Admission: RE | Disposition: A | Discharge status: Home / Self Care | Payer: Self-pay | Source: Home / Self Care | Attending: Cardiology

## 2023-11-28 ENCOUNTER — Other Ambulatory Visit: Payer: Self-pay | Admitting: Cardiology

## 2023-11-28 DIAGNOSIS — I200 Unstable angina: Secondary | ICD-10-CM | POA: Diagnosis present

## 2023-11-28 DIAGNOSIS — E7800 Pure hypercholesterolemia, unspecified: Secondary | ICD-10-CM | POA: Insufficient documentation

## 2023-11-28 DIAGNOSIS — Z8249 Family history of ischemic heart disease and other diseases of the circulatory system: Secondary | ICD-10-CM | POA: Insufficient documentation

## 2023-11-28 DIAGNOSIS — I25110 Atherosclerotic heart disease of native coronary artery with unstable angina pectoris: Secondary | ICD-10-CM | POA: Insufficient documentation

## 2023-11-28 DIAGNOSIS — Z8673 Personal history of transient ischemic attack (TIA), and cerebral infarction without residual deficits: Secondary | ICD-10-CM | POA: Diagnosis not present

## 2023-11-28 DIAGNOSIS — F17210 Nicotine dependence, cigarettes, uncomplicated: Secondary | ICD-10-CM | POA: Insufficient documentation

## 2023-11-28 DIAGNOSIS — R079 Chest pain, unspecified: Secondary | ICD-10-CM

## 2023-11-28 DIAGNOSIS — I10 Essential (primary) hypertension: Secondary | ICD-10-CM | POA: Insufficient documentation

## 2023-11-28 HISTORY — PX: LEFT HEART CATH AND CORONARY ANGIOGRAPHY: CATH118249

## 2023-11-28 LAB — CBC
Hematocrit: 48.2 % (ref 37.5–51.0)
Hemoglobin: 15.4 g/dL (ref 13.0–17.7)
MCH: 30.3 pg (ref 26.6–33.0)
MCHC: 32 g/dL (ref 31.5–35.7)
MCV: 95 fL (ref 79–97)
Platelets: 225 10*3/uL (ref 150–450)
RBC: 5.08 x10E6/uL (ref 4.14–5.80)
RDW: 12.4 % (ref 11.6–15.4)
WBC: 9.6 10*3/uL (ref 3.4–10.8)

## 2023-11-28 LAB — BASIC METABOLIC PANEL
BUN/Creatinine Ratio: 16 (ref 10–24)
BUN: 21 mg/dL (ref 8–27)
CO2: 22 mmol/L (ref 20–29)
Calcium: 9.8 mg/dL (ref 8.6–10.2)
Chloride: 102 mmol/L (ref 96–106)
Creatinine, Ser: 1.31 mg/dL — ABNORMAL HIGH (ref 0.76–1.27)
Glucose: 96 mg/dL (ref 70–99)
Potassium: 5 mmol/L (ref 3.5–5.2)
Sodium: 140 mmol/L (ref 134–144)
eGFR: 60 mL/min/{1.73_m2} (ref >59)

## 2023-11-28 SURGERY — LEFT HEART CATH AND CORONARY ANGIOGRAPHY
Anesthesia: LOCAL

## 2023-11-28 MED ORDER — IOHEXOL 350 MG/ML SOLN
INTRAVENOUS | Status: DC | PRN
  Administered 2023-11-28: 60 mL

## 2023-11-28 MED ORDER — HEPARIN SODIUM (PORCINE) 1000 UNIT/ML IJ SOLN
INTRAMUSCULAR | Status: AC
  Filled 2023-11-28: qty 10

## 2023-11-28 MED ORDER — NITROGLYCERIN 0.4 MG SL SUBL
SUBLINGUAL_TABLET | SUBLINGUAL | Status: DC | PRN
  Administered 2023-11-28: .4 mg via SUBLINGUAL

## 2023-11-28 MED ORDER — LIDOCAINE HCL (PF) 1 % IJ SOLN
INTRAMUSCULAR | Status: DC | PRN
  Administered 2023-11-28: 5 mL

## 2023-11-28 MED ORDER — LABETALOL HCL 5 MG/ML IV SOLN: 10.0000 mg | INTRAVENOUS | Status: DC | PRN

## 2023-11-28 MED ORDER — SODIUM CHLORIDE 0.9 % WEIGHT BASED INFUSION
3.0000 mL/kg/h | INTRAVENOUS | Status: AC
  Administered 2023-11-28: 3 mL/kg/h via INTRAVENOUS

## 2023-11-28 MED ORDER — SODIUM CHLORIDE 0.9% FLUSH: 3.0000 mL | Freq: Two times a day (BID) | INTRAVENOUS | Status: DC

## 2023-11-28 MED ORDER — VERAPAMIL HCL 2.5 MG/ML IV SOLN
INTRAVENOUS | Status: DC | PRN
  Administered 2023-11-28: 10 mL via INTRA_ARTERIAL

## 2023-11-28 MED ORDER — SODIUM CHLORIDE 0.9 % WEIGHT BASED INFUSION: 1.0000 mL/kg/h | INTRAVENOUS | Status: DC

## 2023-11-28 MED ORDER — ACETAMINOPHEN 325 MG PO TABS: 650.0000 mg | ORAL_TABLET | ORAL | Status: DC | PRN

## 2023-11-28 MED ORDER — ISOSORBIDE MONONITRATE ER 30 MG PO TB24: 30.0000 mg | ORAL_TABLET | Freq: Every day | ORAL | 11 refills | Status: DC

## 2023-11-28 MED ORDER — VERAPAMIL HCL 2.5 MG/ML IV SOLN
INTRAVENOUS | Status: AC
  Filled 2023-11-28: qty 2

## 2023-11-28 MED ORDER — FENTANYL CITRATE (PF) 100 MCG/2ML IJ SOLN
INTRAMUSCULAR | Status: AC
  Filled 2023-11-28: qty 2

## 2023-11-28 MED ORDER — HYDRALAZINE HCL 20 MG/ML IJ SOLN: 10.0000 mg | INTRAMUSCULAR | Status: DC | PRN

## 2023-11-28 MED ORDER — NITROGLYCERIN 0.4 MG SL SUBL
SUBLINGUAL_TABLET | SUBLINGUAL | Status: AC
  Filled 2023-11-28: qty 1

## 2023-11-28 MED ORDER — SODIUM CHLORIDE 0.9% FLUSH: 3.0000 mL | INTRAVENOUS | Status: DC | PRN

## 2023-11-28 MED ORDER — HEPARIN SODIUM (PORCINE) 1000 UNIT/ML IJ SOLN
INTRAMUSCULAR | Status: DC | PRN
  Administered 2023-11-28: 5000 [IU] via INTRAVENOUS

## 2023-11-28 MED ORDER — MIDAZOLAM HCL 2 MG/2ML IJ SOLN
INTRAMUSCULAR | Status: AC
  Filled 2023-11-28: qty 2

## 2023-11-28 MED ORDER — ONDANSETRON HCL 4 MG/2ML IJ SOLN: 4.0000 mg | Freq: Four times a day (QID) | INTRAMUSCULAR | Status: DC | PRN

## 2023-11-28 MED ORDER — LIDOCAINE HCL (PF) 1 % IJ SOLN
INTRAMUSCULAR | Status: AC
  Filled 2023-11-28: qty 30

## 2023-11-28 MED ORDER — MIDAZOLAM HCL 2 MG/2ML IJ SOLN
INTRAMUSCULAR | Status: DC | PRN
  Administered 2023-11-28: 1 mg via INTRAVENOUS

## 2023-11-28 MED ORDER — ASPIRIN 81 MG PO CHEW: 81.0000 mg | CHEWABLE_TABLET | ORAL | Status: DC

## 2023-11-28 MED ORDER — HEPARIN (PORCINE) IN NACL 1000-0.9 UT/500ML-% IV SOLN
INTRAVENOUS | Status: DC | PRN
  Administered 2023-11-28 (×2): 500 mL

## 2023-11-28 MED ORDER — METOPROLOL SUCCINATE ER 25 MG PO TB24: 25.0000 mg | ORAL_TABLET | Freq: Every day | ORAL | 11 refills | Status: DC

## 2023-11-28 MED ORDER — FENTANYL CITRATE (PF) 100 MCG/2ML IJ SOLN
INTRAMUSCULAR | Status: DC | PRN
  Administered 2023-11-28: 25 ug via INTRAVENOUS

## 2023-11-28 MED ORDER — SODIUM CHLORIDE 0.9 % IV SOLN: 250.0000 mL | INTRAVENOUS | Status: DC | PRN

## 2023-11-28 SURGICAL SUPPLY — 8 items
CATH 5FR JL3.5 JR4 ANG PIG MP (CATHETERS) IMPLANT
CATH INFINITI 5FR JL4 (CATHETERS) IMPLANT
DEVICE RAD COMP TR BAND LRG (VASCULAR PRODUCTS) IMPLANT
GLIDESHEATH SLEND SS 6F .021 (SHEATH) IMPLANT
GUIDEWIRE INQWIRE 1.5J.035X260 (WIRE) IMPLANT
INQWIRE 1.5J .035X260CM (WIRE) ×1
PACK CARDIAC CATHETERIZATION (CUSTOM PROCEDURE TRAY) ×1 IMPLANT
SET ATX-X65L (MISCELLANEOUS) IMPLANT

## 2023-11-28 NOTE — Interval H&P Note (Signed)
History and Physical Interval Note:  11/28/2023 2:16 PM  Dave Williams  has presented today for surgery, with the diagnosis of unstable angina.  The various methods of treatment have been discussed with the patient and family. After consideration of risks, benefits and other options for treatment, the patient has consented to  Procedure(s): LEFT HEART CATH AND CORONARY ANGIOGRAPHY (N/A) as a surgical intervention.  The patient's history has been reviewed, patient examined, no change in status, stable for surgery.  I have reviewed the patient's chart and labs.  Questions were answered to the patient's satisfaction.   Cath Lab Visit (complete for each Cath Lab visit)  Clinical Evaluation Leading to the Procedure:   ACS: Yes.    Non-ACS:    Anginal Classification: CCS III  Anti-ischemic medical therapy: Minimal Therapy (1 class of medications)  Non-Invasive Test Results: No non-invasive testing performed  Prior CABG: No previous CABG        Theron Arista [facility] 11/28/2023 2:16 PM

## 2023-11-28 NOTE — Progress Notes (Signed)
Echo ordered per Dr. Swaziland for CABG evaluation

## 2023-11-29 ENCOUNTER — Encounter (HOSPITAL_COMMUNITY): Payer: Self-pay | Admitting: Cardiology

## 2023-11-30 ENCOUNTER — Ambulatory Visit (HOSPITAL_COMMUNITY): Payer: Medicare HMO | Attending: Cardiology

## 2023-11-30 DIAGNOSIS — I10 Essential (primary) hypertension: Secondary | ICD-10-CM

## 2023-11-30 DIAGNOSIS — I200 Unstable angina: Secondary | ICD-10-CM | POA: Diagnosis not present

## 2023-11-30 LAB — ECHOCARDIOGRAM COMPLETE
Area-P 1/2: 2.87 cm2
S' Lateral: 2.8 cm

## 2023-12-12 ENCOUNTER — Other Ambulatory Visit: Payer: Self-pay | Admitting: Orthopaedic Surgery

## 2023-12-12 NOTE — Telephone Encounter (Signed)
 Dr. Sanjuan Dame pt - spoke w/the pt, he is requesting a refill on Hydrocodone 5-325, 56 tablets, One tablet by mouth every six hours as needed for pain to be sent to [facility]

## 2023-12-15 MED ORDER — HYDROCODONE-ACETAMINOPHEN 5-325 MG PO TABS: 1.0000 | ORAL_TABLET | Freq: Four times a day (QID) | ORAL | 0 refills | Status: DC | PRN

## 2023-12-21 ENCOUNTER — Telehealth: Payer: Self-pay | Admitting: Orthopedic Surgery

## 2023-12-21 NOTE — Progress Notes (Signed)
 [address]       [place] [address]             [phone]        Jaxton Casale [facility] Medical Record #[id number] Date of Birth: [birth year]  Referring: Jordan, Peter M, MD Primary Care: Myra Geni ORN, FNP Primary Cardiologist:None  Chief Complaint:    Chief Complaint  Patient presents with   Coronary Artery Disease    New patient consultation, CATH 12/17, ECHO     History of Present Illness:     Dave Williams 68 y.o. male presents for surgical evaluation of three-vessel coronary artery disease.  He works as an MUSEUM/GALLERY EXHIBITIONS OFFICER, and has had 2 emergency room visits for worsening chest pain and shortness of breath.  He subsequently underwent a left heart cath which identified the disease.  Currently experiencing some anginal symptoms with exertion.   Past Medical and Surgical History: Previous Chest Surgery: No Previous Chest Radiation: No Diabetes Mellitus: No.  HbA1C pending Creatinine:  Lab Results  Component Value Date   CREATININE 1.31 (H) 11/27/2023   CREATININE 1.10 11/03/2023   CREATININE 1.17 11/07/2021     Past Medical History:  Diagnosis Date   Chronic left hip pain    Hypertension     Past Surgical History:  Procedure Laterality Date   BACK SURGERY     KNEE ARTHROSCOPY     LEFT HEART CATH AND CORONARY ANGIOGRAPHY N/A 11/28/2023   Procedure: LEFT HEART CATH AND CORONARY ANGIOGRAPHY;  Surgeon: Jordan, Peter M, MD;  Location: [facility] INVASIVE CV LAB;  Service: Cardiovascular;  Laterality: N/A;   PENILE CYST REMOVAL      Social History:  Social History   Tobacco Use  Smoking Status Every Day   Current packs/day: 1.00   Types: Cigarettes  Smokeless Tobacco Never    Social History   Substance and Sexual Activity  Alcohol Use No     No Known Allergies   Current Outpatient Medications  Medication Sig Dispense Refill   amLODipine  (NORVASC ) 5 MG tablet Take 1 tablet (5 mg total) by mouth daily. 30 tablet 0   aspirin  EC 81  MG tablet Take 1 tablet (81 mg total) by mouth daily. Swallow whole.     atorvastatin  (LIPITOR ) 20 MG tablet Take 20 mg by mouth daily.     HYDROcodone -acetaminophen  (NORCO/VICODIN) 5-325 MG tablet Take 1 tablet by mouth every 6 (six) hours as needed for moderate pain (pain score 4-6). 56 tablet 0   isosorbide  mononitrate (IMDUR ) 30 MG 24 hr tablet Take 1 tablet (30 mg total) by mouth daily. 30 tablet 11   metoprolol  succinate (TOPROL  XL) 25 MG 24 hr tablet Take 1 tablet (25 mg total) by mouth daily. 30 tablet 11   nitroGLYCERIN  (NITROSTAT ) 0.3 MG SL tablet Place 1 tablet (0.3 mg total) under the tongue every 5 (five) minutes as needed for chest pain. 90 tablet 2   No current facility-administered medications for this visit.    (Not in a hospital admission)   Family History  Problem Relation Age of Onset   Cancer Mother    Hypertension Mother    Heart failure Mother    Cancer Father    Hypertension Father    Heart failure Father      Review of Systems:   Review of Systems  Constitutional:  Positive for malaise/fatigue.  Respiratory:  Positive for shortness of breath.   Cardiovascular:  Positive for chest pain.  Neurological:  Negative.       Physical Exam: BP 126/72 (BP Location: Left Arm, Patient Position: Sitting, Cuff Size: Normal)   Pulse 71   Resp 20   Ht 6' 3 (1.905 m)   Wt 234 lb (106.1 kg)   SpO2 94% Comment: RA  BMI 29.25 kg/m  Physical Exam Constitutional:      General: He is not in acute distress.    Appearance: He is normal weight. He is not ill-appearing.  HENT:     Head: Normocephalic and atraumatic.  Eyes:     Extraocular Movements: Extraocular movements intact.  Cardiovascular:     Rate and Rhythm: Normal rate.  Pulmonary:     Effort: Pulmonary effort is normal. No respiratory distress.  Abdominal:     General: Abdomen is flat. There is no distension.  Musculoskeletal:        General: Normal range of motion.     Cervical back: Normal range of  motion.  Skin:    General: Skin is warm and dry.     Comments: Significant left lower extremity varicosity.  No significant varicose veins right  Neurological:     General: No focal deficit present.     Mental Status: He is alert and oriented to person, place, and time.       Diagnostic Studies & Laboratory data:    Left Heart Catherization:  Intervention  Echo: IMPRESSIONS     1. Left ventricular ejection fraction, by estimation, is 65 to 70%. The  left ventricle has normal function. The left ventricle has no regional  wall motion abnormalities. Left ventricular diastolic parameters are  consistent with Grade I diastolic  dysfunction (impaired relaxation).   2. Right ventricular systolic function is normal. The right ventricular  size is normal.   3. The mitral valve is normal in structure. Trivial mitral valve  regurgitation. No evidence of mitral stenosis.   4. The aortic valve is tricuspid. There is mild calcification of the  aortic valve. Aortic valve regurgitation is not visualized. Aortic valve  sclerosis/calcification is present, without any evidence of aortic  stenosis.   5. The inferior vena cava is normal in size with greater than 50%  respiratory variability, suggesting right atrial pressure of 3 mmHg.     EKG: sinus I have independently reviewed the above radiologic studies and discussed with the patient   Recent Lab Findings: Lab Results  Component Value Date   WBC 9.6 11/27/2023   HGB 15.4 11/27/2023   HCT 48.2 11/27/2023   PLT 225 11/27/2023   GLUCOSE 96 11/27/2023   ALT 21 11/03/2023   AST 21 11/03/2023   NA 140 11/27/2023   K 5.0 11/27/2023   CL 102 11/27/2023   CREATININE 1.31 (H) 11/27/2023   BUN 21 11/27/2023   CO2 22 11/27/2023      Assessment / Plan:   68 year old male with three-vessel coronary artery disease.  Left heart cath shows good targets in the PDA, LAD, OM1, and possibly OM 2 or 3.  His echocardiogram shows preserved  biventricular function and no significant valvular disease.  We discussed the risk benefits of surgical revascularization.  He is agreeable to proceed, but he has significant social issues with his roommate.  He would like to arrange to have his roommate removed prior to surgery will need another month to plan this.  He will give us  a call with the beginning of February for scheduling.  Given him a prescription for sublingual nitroglycerin .  CABG 3-4.  LAD, PDA, OM1 and 2/3   I  spent 40 minutes counseling the patient face to face.   Paulette Lynch O Tamsen Reist 12/22/2023 1:50 PM

## 2023-12-22 ENCOUNTER — Other Ambulatory Visit: Payer: Self-pay | Admitting: Thoracic Surgery (Cardiothoracic Vascular Surgery)

## 2023-12-22 ENCOUNTER — Other Ambulatory Visit: Payer: Self-pay

## 2023-12-22 ENCOUNTER — Encounter: Payer: Self-pay | Admitting: Thoracic Surgery (Cardiothoracic Vascular Surgery)

## 2023-12-22 ENCOUNTER — Institutional Professional Consult (permissible substitution) (INDEPENDENT_AMBULATORY_CARE_PROVIDER_SITE_OTHER): Payer: Medicare HMO | Admitting: Thoracic Surgery (Cardiothoracic Vascular Surgery)

## 2023-12-22 VITALS — BP 126/72 | HR 71 | Resp 20 | Ht 75.0 in | Wt 234.0 lb

## 2023-12-22 DIAGNOSIS — I2510 Atherosclerotic heart disease of native coronary artery without angina pectoris: Secondary | ICD-10-CM | POA: Insufficient documentation

## 2023-12-22 DIAGNOSIS — I25119 Atherosclerotic heart disease of native coronary artery with unspecified angina pectoris: Secondary | ICD-10-CM | POA: Diagnosis not present

## 2023-12-22 MED ORDER — NITROGLYCERIN 0.3 MG SL SUBL: 0.3000 mg | SUBLINGUAL_TABLET | SUBLINGUAL | 2 refills | Status: DC | PRN

## 2023-12-22 NOTE — Progress Notes (Signed)
 Thurston Pounds with [facility] contacted the office requesting in stock medication Nitroglycerin 0.4 mg from 0.3 mg. Dr. Cliffton Asters aware and approved.

## 2023-12-27 ENCOUNTER — Encounter: Payer: Self-pay | Admitting: Cardiovascular Disease

## 2023-12-27 ENCOUNTER — Other Ambulatory Visit (HOSPITAL_COMMUNITY): Payer: Medicare HMO

## 2023-12-27 ENCOUNTER — Ambulatory Visit: Payer: Medicare HMO | Attending: Cardiovascular Disease | Admitting: Cardiovascular Disease

## 2023-12-27 VITALS — BP 108/80 | HR 80 | Ht 75.0 in | Wt 232.0 lb

## 2023-12-27 DIAGNOSIS — Z8679 Personal history of other diseases of the circulatory system: Secondary | ICD-10-CM | POA: Diagnosis not present

## 2023-12-27 DIAGNOSIS — E7800 Pure hypercholesterolemia, unspecified: Secondary | ICD-10-CM

## 2023-12-27 DIAGNOSIS — I25119 Atherosclerotic heart disease of native coronary artery with unspecified angina pectoris: Secondary | ICD-10-CM

## 2023-12-27 NOTE — Assessment & Plan Note (Signed)
 History of CAD status post left heart cath by Dr. Swaziland 11/28/2023 revealing three-vessel disease with normal LV systolic function both by left ventriculography and 2D echocardiography.  Dave Williams gets occasional angina.  Dave Williams is scheduled for CABG Dr. Deloise Ferries sometime at the end of February.  Dave Williams does have sublingual nitroglycerin  at home.

## 2023-12-27 NOTE — Progress Notes (Signed)
 12/27/2023 Dave Williams   1956/04/12  [id number]  Primary Physician Caresse Chant, FNP Primary Cardiologist: [name] MD Bennye Bravo, [place]  HPI:  Dave Williams is a 68 y.o.  Thin appearing divorced Caucasian male with no children who is been disabled because of back issues and was referred for chest pain by the emergency room where he was seen 11/04/2023.  I last saw him in the office 11/27/2023.  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 His risk factors include over 50 pack years tobacco abuse currently smoking 1/2 pack/day, treated hypertension and hyperlipidemia. His father had CAD and had stents notably CABG. He has never had a heart attack but apparently had a TIA 3 years ago. He has had onset of chest pain approxi-6 weeks ago that occurs several times a day and radiates to his jaw, shoulder and left upper extremity. He was given sublingual nitroglycerin  in the ambulance which relieved his chest pain. He has a first responder and EMT as well.  He underwent outpatient radial diagnostic cath by Dr. Swaziland 11/28/2023 revealing three-vessel disease with preserved LV function.  He does continue to smoke.  We talked about the importance of smoking cessation before bypass surgery.  He saw Dr. Deloise Ferries in the office on 12/22/2023 for surgical consultation he felt he was a good candidate for CABG which is scheduled electively sometime within the next 30 days.   Current Meds  Medication Sig   amLODipine  (NORVASC ) 5 MG tablet Take 1 tablet (5 mg total) by mouth daily.   aspirin  EC 81 MG tablet Take 1 tablet (81 mg total) by mouth daily. Swallow whole.   atorvastatin  (LIPITOR ) 20 MG tablet Take 20 mg by mouth daily.   HYDROcodone -acetaminophen  (NORCO/VICODIN) 5-325 MG tablet Take 1 tablet by mouth every 6 (six) hours as needed for moderate pain (pain score 4-6).   isosorbide  mononitrate (IMDUR ) 30 MG 24 hr tablet Take 1 tablet (30 mg total) by mouth daily.    metoprolol  succinate (TOPROL  XL) 25 MG 24 hr tablet Take 1 tablet (25 mg total) by mouth daily.     No Known Allergies  Social History   Socioeconomic History   Marital status: Single    Spouse name: Not on file   Number of children: Not on file   Years of education: Not on file   Highest education level: Not on file  Occupational History   Not on file  Tobacco Use   Smoking status: Every Day    Current packs/day: 1.00    Types: Cigarettes   Smokeless tobacco: Never  Vaping Use   Vaping status: Never Used  Substance and Sexual Activity   Alcohol use: No   Drug use: No   Sexual activity: Not on file  Other Topics Concern   Not on file  Social History Narrative   Not on file   Social Drivers of Health   Financial Resource Strain: Not on file  Food Insecurity: Not on file  Transportation Needs: Not on file  Physical Activity: Not on file  Stress: Not on file  Social Connections: Not on file  Intimate Partner Violence: Not on file     Review of Systems: General: negative for chills, fever, night sweats or weight changes.  Cardiovascular: negative for chest pain, dyspnea on exertion, edema, orthopnea, palpitations, paroxysmal nocturnal dyspnea or shortness of breath Dermatological: negative for rash Respiratory: negative for cough or wheezing Urologic: negative for hematuria Abdominal: negative for  nausea, vomiting, diarrhea, bright red blood per rectum, melena, or hematemesis Neurologic: negative for visual changes, syncope, or dizziness All other systems reviewed and are otherwise negative except as noted above.    Blood pressure 108/80, pulse 80, height 6\' 3"  (1.905 m), weight 232 lb (105.2 kg), SpO2 96%.  General appearance: alert and no distress Neck: no adenopathy, no carotid bruit, no JVD, supple, symmetrical, trachea midline, and thyroid not enlarged, symmetric, no tenderness/mass/nodules Lungs: clear to auscultation bilaterally Heart: regular rate and  rhythm, S1, S2 normal, no murmur, click, rub or gallop Extremities: extremities normal, atraumatic, no cyanosis or edema Pulses: 2+ and symmetric Skin: Skin color, texture, turgor normal. No rashes or lesions Neurologic: Grossly normal  EKG not performed today      ASSESSMENT AND PLAN:   History of hypertension History of essential hypertension her blood pressure measured today at 108/80.  He is on amlodipine  and metoprolol .  Hypercholesterolemia History of hyperlipidemia on atorvastatin .  Will recheck a fasting lipid liver profile.  CAD (coronary artery disease) History of CAD status post left heart cath by Dr. Swaziland 11/28/2023 revealing three-vessel disease with normal LV systolic function both by left ventriculography and 2D echocardiography.  He gets occasional angina.  He is scheduled for CABG Dr. Deloise Ferries sometime at the end of February.  He does have sublingual nitroglycerin  at home.     [name] MD FACP,FACC,FAHA, [facility] 12/27/2023 2:20 PM

## 2023-12-27 NOTE — Assessment & Plan Note (Signed)
 History of hyperlipidemia on atorvastatin .  Will recheck a fasting lipid liver profile.

## 2023-12-27 NOTE — Assessment & Plan Note (Signed)
 History of essential hypertension her blood pressure measured today at 108/80.  He is on amlodipine  and metoprolol .

## 2023-12-27 NOTE — Patient Instructions (Signed)
 Medication Instructions:  Your physician recommends that you continue on your current medications as directed. Please refer to the Current Medication list given to you today.  *If you need a refill on your cardiac medications before your next appointment, please call your pharmacy*   Lab Work: Your physician recommends that you return for lab work in: the next week or 2 for FASTING lipid/liver panel  If you have labs (blood work) drawn today and your tests are completely normal, you will receive your results only by: MyChart Message (if you have MyChart) OR A paper copy in the mail If you have any lab test that is abnormal or we need to change your treatment, we will call you to review the results.   Follow-Up: At [facility], you and your health needs are our priority.  As part of our continuing mission to provide you with exceptional heart care, we have created designated Provider Care Teams.  These Care Teams include your primary Cardiologist (physician) and Advanced Practice Providers (APPs -  Physician Assistants and Nurse Practitioners) who all work together to provide you with the care you need, when you need it.  We recommend signing up for the patient portal called "MyChart".  Sign up information is provided on this After Visit Summary.  MyChart is used to connect with patients for Virtual Visits (Telemedicine).  Patients are able to view lab/test results, encounter notes, upcoming appointments, etc.  Non-urgent messages can be sent to your provider as well.   To learn more about what you can do with MyChart, go to [email or web address].    Your next appointment:   4 month(s)  Provider:   Lauro Portal, MD   Other Instructions

## 2024-01-06 ENCOUNTER — Telehealth: Payer: Self-pay | Admitting: Orthopaedic Surgery

## 2024-01-09 ENCOUNTER — Ambulatory Visit: Payer: Medicare HMO | Admitting: Cardiovascular Disease

## 2024-01-22 ENCOUNTER — Telehealth: Payer: Self-pay | Admitting: Orthopaedic Surgery

## 2024-01-26 ENCOUNTER — Ambulatory Visit (INDEPENDENT_AMBULATORY_CARE_PROVIDER_SITE_OTHER): Payer: Medicare HMO | Admitting: Thoracic Surgery (Cardiothoracic Vascular Surgery)

## 2024-01-26 ENCOUNTER — Other Ambulatory Visit: Payer: Self-pay | Admitting: Thoracic Surgery (Cardiothoracic Vascular Surgery)

## 2024-01-26 VITALS — BP 135/75 | HR 70 | Resp 18 | Ht 75.0 in | Wt 228.0 lb

## 2024-01-26 DIAGNOSIS — I25119 Atherosclerotic heart disease of native coronary artery with unspecified angina pectoris: Secondary | ICD-10-CM

## 2024-01-26 MED ORDER — NITROGLYCERIN 0.3 MG SL SUBL: 0.3000 mg | SUBLINGUAL_TABLET | SUBLINGUAL | 2 refills | Status: DC | PRN

## 2024-01-26 NOTE — Progress Notes (Signed)
     [address]       [name] [address]             [phone]       Patient states that he is still not able to proceed with surgery due to his living situation.  He continues to have some chest pain and has been using some nitroglycerin, but does not want to proceed with surgery as of yet.  We discussed the importance of not delaying this much further, and he is aware.  Vitals:   01/26/24 1233  BP: 135/75  Pulse: 70  Resp: 18  SpO2: 94%   Alert NAD Easy work of breathing Heart regular rate.      68 year old male with coronary artery disease.  He is not quite ready to proceed with surgery.  He will call us back when he is ready.

## 2024-01-31 ENCOUNTER — Ambulatory Visit: Payer: Medicare HMO | Admitting: Orthopaedic Surgery

## 2024-02-05 ENCOUNTER — Encounter: Payer: Self-pay | Admitting: *Deleted

## 2024-02-05 ENCOUNTER — Other Ambulatory Visit: Payer: Self-pay | Admitting: *Deleted

## 2024-02-05 DIAGNOSIS — I25119 Atherosclerotic heart disease of native coronary artery with unspecified angina pectoris: Secondary | ICD-10-CM

## 2024-02-07 ENCOUNTER — Encounter: Payer: Self-pay | Admitting: Orthopaedic Surgery

## 2024-02-07 ENCOUNTER — Ambulatory Visit: Payer: Medicare HMO | Admitting: Orthopaedic Surgery

## 2024-02-07 VITALS — BP 116/76 | HR 75

## 2024-02-07 DIAGNOSIS — G8929 Other chronic pain: Secondary | ICD-10-CM | POA: Diagnosis not present

## 2024-02-07 DIAGNOSIS — M5442 Lumbago with sciatica, left side: Secondary | ICD-10-CM | POA: Diagnosis not present

## 2024-02-07 MED ORDER — HYDROCODONE-ACETAMINOPHEN 7.5-325 MG PO TABS: 1.0000 | ORAL_TABLET | Freq: Four times a day (QID) | ORAL | 0 refills | Status: AC | PRN

## 2024-02-07 NOTE — Progress Notes (Signed)
 My back is hurting.  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  She is soon going to be in a nursing home.  He says his pain should improve as he is giving about total care to her.  He has no other trauma, no weakness.  Spine/Pelvis examination:  Inspection:  Overall, sacoiliac joint benign and hips nontender; without crepitus or defects.   Thoracic spine inspection: Alignment normal without kyphosis present   Lumbar spine inspection:  Alignment  with normal lumbar lordosis, without scoliosis apparent.   Thoracic spine palpation:  without tenderness of spinal processes   Lumbar spine palpation: without tenderness of lumbar area; without tightness of lumbar muscles    Range of Motion:   Lumbar flexion, forward flexion is normal without pain or tenderness    Lumbar extension is full without pain or tenderness   Left lateral bend is normal without pain or tenderness   Right lateral bend is normal without pain or tenderness   Straight leg raising is normal  Strength & tone: normal   Stability overall normal stability Encounter Diagnosis  Name Primary?   Chronic left-sided low back pain with left-sided sciatica Yes   I will increase pain medicine and refill today.  Return in three months.  Consider joining the gym and gradually increase working out.  Call if any problem.  Precautions discussed.  Electronically Signed Darreld Mclean, MD 2/26/20252:23 PM

## 2024-02-23 ENCOUNTER — Ambulatory Visit: Payer: Medicare HMO | Admitting: Thoracic Surgery (Cardiothoracic Vascular Surgery)

## 2024-02-28 ENCOUNTER — Encounter (HOSPITAL_COMMUNITY): Payer: Self-pay

## 2024-02-28 NOTE — Pre-Procedure Instructions (Signed)
 Surgical Instructions   Your procedure is scheduled on March 04, 2024. Report to [facility] Main Entrance "A" at 5:30 A.M., then check in with the Admitting office. Any questions or running late day of surgery: call [phone]  Questions prior to your surgery date: call [phone], Monday-Friday, 8am-4pm. If you experience any cold or flu symptoms such as cough, fever, chills, shortness of breath, etc. between now and your scheduled surgery, please notify us at the above number.     Remember:  Do not eat or drink after midnight the night before your surgery   Take these medicines the morning of surgery with A SIP OF WATER: amLODipine (NORVASC)  atorvastatin (LIPITOR)  isosorbide mononitrate (IMDUR)  metoprolol succinate (TOPROL XL)    May take these medicines IF NEEDED: HYDROcodone-acetaminophen (NORCO)  nitroGLYCERIN (NITROSTAT) - if dose taken prior to surgery, please call either of the above phone numbers   Continue taking your Aspirin through the day before surgery. DO NOT take any the morning of surgery.   One week prior to surgery, STOP taking any Aleve, Naproxen, Ibuprofen, Motrin, Advil, Goody's, BC's, all herbal medications, fish oil, and non-prescription vitamins.                     Do NOT Smoke (Tobacco/Vaping) for 24 hours prior to your procedure.  If you use a CPAP at night, you may bring your mask/headgear for your overnight stay.   You will be asked to remove any contacts, glasses, piercing's, hearing aid's, dentures/partials prior to surgery. Please bring cases for these items if needed.    Patients discharged the day of surgery will not be allowed to drive home, and someone needs to stay with them for 24 hours.  SURGICAL WAITING ROOM VISITATION Patients may have no more than 2 support people in the waiting area - these visitors may rotate.   Pre-op nurse will coordinate an appropriate time for 1 ADULT support person, who may not rotate, to accompany  patient in pre-op.  Children under the age of 47 must have an adult with them who is not the patient and must remain in the main waiting area with an adult.  If the patient needs to stay at the hospital during part of their recovery, the visitor guidelines for inpatient rooms apply.  Please refer to the [facility] website for the visitor guidelines for any additional information.   If you received a COVID test during your pre-op visit  it is requested that you wear a mask when out in public, stay away from anyone that may not be feeling well and notify your surgeon if you develop symptoms. If you have been in contact with anyone that has tested positive in the last 10 days please notify you surgeon.      Pre-operative CHG Bathing Instructions   You can play a key role in reducing the risk of infection after surgery. Your skin needs to be as free of germs as possible. You can reduce the number of germs on your skin by washing with CHG (chlorhexidine gluconate) soap before surgery. CHG is an antiseptic soap that kills germs and continues to kill germs even after washing.   DO NOT use if you have an allergy to chlorhexidine/CHG or antibacterial soaps. If your skin becomes reddened or irritated, stop using the CHG and notify one of our RNs at [phone].              TAKE A SHOWER THE NIGHT  BEFORE SURGERY AND THE DAY OF SURGERY    Please keep in mind the following:  DO NOT shave, including legs and underarms, 48 hours prior to surgery.   You may shave your face before/day of surgery.  Place clean sheets on your bed the night before surgery Use a clean washcloth (not used since being washed) for each shower. DO NOT sleep with pet's night before surgery.  CHG Shower Instructions:  Wash your face and private area with normal soap. If you choose to wash your hair, wash first with your normal shampoo.  After you use shampoo/soap, rinse your hair and body thoroughly to remove shampoo/soap  residue.  Turn the water OFF and apply half the bottle of CHG soap to a CLEAN washcloth.  Apply CHG soap ONLY FROM YOUR NECK DOWN TO YOUR TOES (washing for 3-5 minutes)  DO NOT use CHG soap on face, private areas, open wounds, or sores.  Pay special attention to the area where your surgery is being performed.  If you are having back surgery, having someone wash your back for you may be helpful. Wait 2 minutes after CHG soap is applied, then you may rinse off the CHG soap.  Pat dry with a clean towel  Put on clean pajamas    Additional instructions for the day of surgery: DO NOT APPLY any lotions, deodorants, cologne, or perfumes.   Do not wear jewelry or makeup Do not wear nail polish, gel polish, artificial nails, or any other type of covering on natural nails (fingers and toes) Do not bring valuables to the hospital. [facility] is not responsible for valuables/personal belongings. Put on clean/comfortable clothes.  Please brush your teeth.  Ask your nurse before applying any prescription medications to the skin.

## 2024-02-29 ENCOUNTER — Encounter (HOSPITAL_COMMUNITY): Payer: Self-pay

## 2024-02-29 ENCOUNTER — Ambulatory Visit (HOSPITAL_COMMUNITY)
Admission: RE | Admit: 2024-02-29 | Discharge: 2024-02-29 | Disposition: A | Discharge status: Home / Self Care | Payer: Medicare HMO | Source: Ambulatory Visit | Attending: Thoracic Surgery (Cardiothoracic Vascular Surgery) | Admitting: Thoracic Surgery (Cardiothoracic Vascular Surgery)

## 2024-02-29 ENCOUNTER — Ambulatory Visit (HOSPITAL_COMMUNITY)
Admission: RE | Admit: 2024-02-29 | Discharge: 2024-02-29 | Disposition: A | Discharge status: Home / Self Care | Source: Ambulatory Visit | Attending: Thoracic Surgery (Cardiothoracic Vascular Surgery)

## 2024-02-29 ENCOUNTER — Other Ambulatory Visit: Payer: Self-pay

## 2024-02-29 ENCOUNTER — Encounter (HOSPITAL_COMMUNITY)
Admission: RE | Admit: 2024-02-29 | Discharge: 2024-02-29 | Disposition: A | Discharge status: Home / Self Care | Payer: Medicare HMO | Source: Ambulatory Visit | Attending: Thoracic Surgery (Cardiothoracic Vascular Surgery) | Admitting: Thoracic Surgery (Cardiothoracic Vascular Surgery)

## 2024-02-29 VITALS — BP 130/78 | HR 70 | Temp 98.4°F | Resp 17 | Ht 75.0 in | Wt 224.8 lb

## 2024-02-29 DIAGNOSIS — I25119 Atherosclerotic heart disease of native coronary artery with unspecified angina pectoris: Secondary | ICD-10-CM | POA: Insufficient documentation

## 2024-02-29 DIAGNOSIS — I6523 Occlusion and stenosis of bilateral carotid arteries: Secondary | ICD-10-CM | POA: Insufficient documentation

## 2024-02-29 DIAGNOSIS — R918 Other nonspecific abnormal finding of lung field: Secondary | ICD-10-CM | POA: Diagnosis not present

## 2024-02-29 DIAGNOSIS — I779 Disorder of arteries and arterioles, unspecified: Secondary | ICD-10-CM | POA: Diagnosis not present

## 2024-02-29 DIAGNOSIS — E785 Hyperlipidemia, unspecified: Secondary | ICD-10-CM | POA: Diagnosis not present

## 2024-02-29 DIAGNOSIS — Z01818 Encounter for other preprocedural examination: Secondary | ICD-10-CM | POA: Insufficient documentation

## 2024-02-29 DIAGNOSIS — I10 Essential (primary) hypertension: Secondary | ICD-10-CM | POA: Insufficient documentation

## 2024-02-29 HISTORY — DX: Atherosclerotic heart disease of native coronary artery without angina pectoris: I25.10

## 2024-02-29 HISTORY — DX: Pneumonia, unspecified organism: J18.9

## 2024-02-29 HISTORY — DX: Other psychoactive substance abuse, uncomplicated: F19.10

## 2024-02-29 LAB — CBC
HCT: 49.1 % (ref 39.0–52.0)
Hemoglobin: 16.4 g/dL (ref 13.0–17.0)
MCH: 31.6 pg (ref 26.0–34.0)
MCHC: 33.4 g/dL (ref 30.0–36.0)
MCV: 94.6 fL (ref 80.0–100.0)
Platelets: 247 10*3/uL (ref 150–400)
RBC: 5.19 MIL/uL (ref 4.22–5.81)
RDW: 12.9 % (ref 11.5–15.5)
WBC: 9 10*3/uL (ref 4.0–10.5)
nRBC: 0 % (ref 0.0–0.2)

## 2024-02-29 LAB — COMPREHENSIVE METABOLIC PANEL
ALT: 37 U/L (ref 0–44)
AST: 36 U/L (ref 15–41)
Albumin: 4.3 g/dL (ref 3.5–5.0)
Alkaline Phosphatase: 40 U/L (ref 38–126)
Anion gap: 7 (ref 5–15)
BUN: 16 mg/dL (ref 8–23)
CO2: 27 mmol/L (ref 22–32)
Calcium: 9.6 mg/dL (ref 8.9–10.3)
Chloride: 102 mmol/L (ref 98–111)
Creatinine, Ser: 1.31 mg/dL — ABNORMAL HIGH (ref 0.61–1.24)
GFR, Estimated: >60 mL/min (ref >60)
Glucose, Bld: 117 mg/dL — ABNORMAL HIGH (ref 70–99)
Potassium: 5.1 mmol/L (ref 3.5–5.1)
Sodium: 136 mmol/L (ref 135–145)
Total Bilirubin: 0.9 mg/dL (ref 0.0–1.2)
Total Protein: 7.4 g/dL (ref 6.5–8.1)

## 2024-02-29 LAB — TYPE AND SCREEN
ABO/RH(D): A POS
Antibody Screen: NEGATIVE

## 2024-02-29 LAB — HEMOGLOBIN A1C
Hgb A1c MFr Bld: 5.8 % — ABNORMAL HIGH (ref 4.8–5.6)
Mean Plasma Glucose: 119.76 mg/dL

## 2024-02-29 LAB — PROTIME-INR
INR: 1 (ref 0.8–1.2)
Prothrombin Time: 13.1 s (ref 11.4–15.2)

## 2024-02-29 LAB — URINALYSIS, ROUTINE W REFLEX MICROSCOPIC
Bilirubin Urine: NEGATIVE
Glucose, UA: NEGATIVE mg/dL
Hgb urine dipstick: NEGATIVE
Ketones, ur: NEGATIVE mg/dL
Leukocytes,Ua: NEGATIVE
Nitrite: NEGATIVE
Protein, ur: NEGATIVE mg/dL
Specific Gravity, Urine: 1.008 (ref 1.005–1.030)
pH: 6 (ref 5.0–8.0)

## 2024-02-29 LAB — SURGICAL PCR SCREEN
MRSA, PCR: NEGATIVE
Staphylococcus aureus: NEGATIVE

## 2024-02-29 LAB — APTT: aPTT: 34 s (ref 24–36)

## 2024-02-29 NOTE — Progress Notes (Signed)
 VASCULAR LAB    Pre CABG Dopplers have been performed.  See CV proc for preliminary results.   Karim Aiello, RVT 02/29/2024, 10:12 AM

## 2024-02-29 NOTE — Progress Notes (Addendum)
 PCP - Erskine Speed, FNP Cardiologist - Dr. Nanetta Batty - Last office visit 12/27/2023  PPM/ICD - Denies Device Orders - n/a Rep Notified - n/a  Chest x-ray - 02/29/2024 EKG - 02/29/2024 Stress Test - Per pt, 30+ years ago ECHO - 11/30/2023 Cardiac Cath - 11/28/2023  Sleep Study - Denies CPAP - n/a  No DM  Last dose of GLP1 agonist- n/a  GLP1 instructions: n/a  Blood Thinner Instructions: n/a Aspirin Instructions: Pt will continue ASA through the day before surgery and take NONE the morning of surgery  NPO after midnight  COVID TEST- n/a   Anesthesia review: No  Patient denies shortness of breath, fever, cough and chest pain at PAT appointment. Pt denies any respiratory illness/infection in the last two months.   All instructions explained to the patient, with a verbal understanding of the material. Patient agrees to go over the instructions while at home for a better understanding. Patient also instructed to self quarantine after being tested for COVID-19. The opportunity to ask questions was provided.

## 2024-03-01 ENCOUNTER — Ambulatory Visit: Admitting: Thoracic Surgery (Cardiothoracic Vascular Surgery)

## 2024-03-01 ENCOUNTER — Ambulatory Visit (INDEPENDENT_AMBULATORY_CARE_PROVIDER_SITE_OTHER): Admitting: Thoracic Surgery (Cardiothoracic Vascular Surgery)

## 2024-03-01 DIAGNOSIS — I25119 Atherosclerotic heart disease of native coronary artery with unspecified angina pectoris: Secondary | ICD-10-CM

## 2024-03-01 LAB — VAS US DOPPLER PRE CABG

## 2024-03-01 MED ORDER — INSULIN REGULAR(HUMAN) IN NACL 100-0.9 UT/100ML-% IV SOLN
INTRAVENOUS | Status: AC
Start: 2024-03-04 — End: 2024-03-04
  Administered 2024-03-04: 1.1 [IU]/h via INTRAVENOUS
  Filled 2024-03-01: qty 100

## 2024-03-01 MED ORDER — NITROGLYCERIN IN D5W 200-5 MCG/ML-% IV SOLN
2.0000 ug/min | INTRAVENOUS | Status: DC
  Filled 2024-03-01: qty 250

## 2024-03-01 MED ORDER — HEPARIN 30,000 UNITS/1000 ML (OHS) CELLSAVER SOLUTION
Status: DC
  Filled 2024-03-01: qty 1000

## 2024-03-01 MED ORDER — MILRINONE LACTATE IN DEXTROSE 20-5 MG/100ML-% IV SOLN
0.3000 ug/kg/min | INTRAVENOUS | Status: DC
  Filled 2024-03-01: qty 100

## 2024-03-01 MED ORDER — NOREPINEPHRINE 4 MG/250ML-% IV SOLN
0.0000 ug/min | INTRAVENOUS | Status: DC
  Filled 2024-03-01: qty 250

## 2024-03-01 MED ORDER — TRANEXAMIC ACID (OHS) PUMP PRIME SOLUTION
2.0000 mg/kg | INTRAVENOUS | Status: DC
Start: 2024-03-04 — End: 2024-03-04
  Filled 2024-03-01: qty 2.04

## 2024-03-01 MED ORDER — EPINEPHRINE HCL 5 MG/250ML IV SOLN IN NS
0.0000 ug/min | INTRAVENOUS | Status: DC
  Filled 2024-03-01: qty 250

## 2024-03-01 MED ORDER — TRANEXAMIC ACID (OHS) BOLUS VIA INFUSION
15.0000 mg/kg | INTRAVENOUS | Status: AC
  Administered 2024-03-04: 1530 mg via INTRAVENOUS
  Filled 2024-03-01: qty 1530

## 2024-03-01 MED ORDER — VANCOMYCIN HCL 1.5 G IV SOLR
1500.0000 mg | INTRAVENOUS | Status: AC
  Administered 2024-03-04: 1500 mg via INTRAVENOUS
  Filled 2024-03-01: qty 30

## 2024-03-01 MED ORDER — CEFAZOLIN SODIUM-DEXTROSE 2-4 GM/100ML-% IV SOLN
2.0000 g | INTRAVENOUS | Status: DC
  Filled 2024-03-01: qty 100

## 2024-03-01 MED ORDER — PHENYLEPHRINE HCL-NACL 20-0.9 MG/250ML-% IV SOLN
30.0000 ug/min | INTRAVENOUS | Status: AC
Start: 2024-03-04 — End: 2024-03-04
  Administered 2024-03-04: 15 ug/min via INTRAVENOUS
  Administered 2024-03-04: 30 ug/min via INTRAVENOUS
  Filled 2024-03-01: qty 250

## 2024-03-01 MED ORDER — CEFAZOLIN SODIUM-DEXTROSE 2-4 GM/100ML-% IV SOLN
2.0000 g | INTRAVENOUS | Status: AC
  Administered 2024-03-04 (×2): 2 g via INTRAVENOUS
  Filled 2024-03-01: qty 100

## 2024-03-01 MED ORDER — POTASSIUM CHLORIDE 2 MEQ/ML IV SOLN
80.0000 meq | INTRAVENOUS | Status: DC
  Filled 2024-03-01: qty 40

## 2024-03-01 MED ORDER — PLASMA-LYTE A IV SOLN
INTRAVENOUS | Status: DC
  Filled 2024-03-01: qty 2.5

## 2024-03-01 MED ORDER — DEXMEDETOMIDINE HCL IN NACL 400 MCG/100ML IV SOLN
0.1000 ug/kg/h | INTRAVENOUS | Status: AC
  Administered 2024-03-04: .3 ug/kg/h via INTRAVENOUS
  Filled 2024-03-01: qty 100

## 2024-03-01 MED ORDER — MANNITOL 20 % IV SOLN
INTRAVENOUS | Status: DC
  Filled 2024-03-01: qty 13

## 2024-03-01 MED ORDER — TRANEXAMIC ACID 1000 MG/10ML IV SOLN
1.5000 mg/kg/h | INTRAVENOUS | Status: AC
  Administered 2024-03-04: 1.5 mg/kg/h via INTRAVENOUS
  Filled 2024-03-01: qty 25

## 2024-03-01 NOTE — Progress Notes (Signed)
     [address]       [name] [address]             [phone]       Patient: Home Provider: Office Consent for Telemedicine visit obtained.  Today's visit was completed via a real-time telehealth (see specific modality noted below). The patient/authorized person provided oral consent at the time of the visit to engage in a telemedicine encounter with the present provider at [facility]. The patient/authorized person was informed of the potential benefits, limitations, and risks of telemedicine. The patient/authorized person expressed understanding that the laws that protect confidentiality also apply to telemedicine. The patient/authorized person acknowledged understanding that telemedicine does not provide emergency services and that he or she would need to call 911 or proceed to the nearest hospital for help if such a need arose.   Total time spent in the clinical discussion 10 minutes.  Telehealth Modality: Phone visit (audio only)  I had a telephone visit with [name].  He is ready for surgery.  Timia Casselman Keane Scrape

## 2024-03-03 NOTE — Anesthesia Preprocedure Evaluation (Signed)
 Anesthesia Evaluation  Patient identified by MRN, date of birth, ID band Patient awake    Reviewed: Allergy & Precautions, NPO status , Patient's Chart, lab work & pertinent test results  History of Anesthesia Complications Negative for: history of anesthetic complications  Airway Mallampati: III  TM Distance: <3 FB Neck ROM: Full    Dental  (+) Dental Advisory Given, Loose, Poor Dentition, Chipped,    Pulmonary Current Smoker and Patient abstained from smoking.   Pulmonary exam normal        Cardiovascular hypertension, Pt. on medications and Pt. on home beta blockers + angina  + CAD  Normal cardiovascular exam   '24 TTE - EF 65 to 70%. Grade I diastolic dysfunction (impaired relaxation). Trivial MR.   '24 Cath - 1. 3 vessel obstructive CAD. Suspect the culprit is the mid LCx bifurcation lesion with OM2 and OM3 2. Normal LVEDP 3. Normal LV function     Neuro/Psych CVA, No Residual Symptoms  negative psych ROS   GI/Hepatic negative GI ROS,,,(+)     substance abuse  cocaine use and marijuana use  Endo/Other  negative endocrine ROS    Renal/GU Renal InsufficiencyRenal disease     Musculoskeletal negative musculoskeletal ROS (+)    Abdominal   Peds  Hematology negative hematology ROS (+)   Anesthesia Other Findings   Reproductive/Obstetrics                             Anesthesia Physical Anesthesia Plan  ASA: 4  Anesthesia Plan: General   Post-op Pain Management: Minimal or no pain anticipated   Induction: Intravenous  PONV Risk Score and Plan: 1 and Treatment may vary due to age or medical condition, Ondansetron and Dexamethasone  Airway Management Planned: Oral ETT  Additional Equipment: Arterial line, CVP, TEE and Ultrasound Guidance Line Placement  Intra-op Plan:   Post-operative Plan: Post-operative intubation/ventilation  Informed Consent: I have reviewed the  patients History and Physical, chart, labs and discussed the procedure including the risks, benefits and alternatives for the proposed anesthesia with the patient or authorized representative who has indicated his/her understanding and acceptance.     Dental advisory given  Plan Discussed with: CRNA and Anesthesiologist  Anesthesia Plan Comments:        Anesthesia Quick Evaluation

## 2024-03-04 ENCOUNTER — Inpatient Hospital Stay (HOSPITAL_COMMUNITY)

## 2024-03-04 ENCOUNTER — Encounter (HOSPITAL_COMMUNITY)
Admission: RE | Disposition: A | Discharge status: Rehabilitation Facility | Payer: Self-pay | Source: Home / Self Care | Attending: Thoracic Surgery (Cardiothoracic Vascular Surgery)

## 2024-03-04 ENCOUNTER — Encounter (HOSPITAL_COMMUNITY): Payer: Self-pay | Admitting: Thoracic Surgery (Cardiothoracic Vascular Surgery)

## 2024-03-04 ENCOUNTER — Inpatient Hospital Stay (HOSPITAL_COMMUNITY): Payer: Self-pay | Admitting: Anesthesiology

## 2024-03-04 ENCOUNTER — Other Ambulatory Visit: Payer: Self-pay

## 2024-03-04 ENCOUNTER — Inpatient Hospital Stay (HOSPITAL_COMMUNITY)
Admission: RE | Admit: 2024-03-04 | Discharge: 2024-03-13 | DRG: 236 | Disposition: A | Discharge status: Rehabilitation Facility | Payer: Medicare HMO | Attending: Thoracic Surgery (Cardiothoracic Vascular Surgery) | Admitting: Thoracic Surgery (Cardiothoracic Vascular Surgery)

## 2024-03-04 DIAGNOSIS — F1490 Cocaine use, unspecified, uncomplicated: Secondary | ICD-10-CM | POA: Diagnosis present

## 2024-03-04 DIAGNOSIS — E871 Hypo-osmolality and hyponatremia: Secondary | ICD-10-CM | POA: Diagnosis not present

## 2024-03-04 DIAGNOSIS — I10 Essential (primary) hypertension: Secondary | ICD-10-CM | POA: Diagnosis present

## 2024-03-04 DIAGNOSIS — I483 Typical atrial flutter: Secondary | ICD-10-CM | POA: Diagnosis not present

## 2024-03-04 DIAGNOSIS — Z8249 Family history of ischemic heart disease and other diseases of the circulatory system: Secondary | ICD-10-CM

## 2024-03-04 DIAGNOSIS — I951 Orthostatic hypotension: Secondary | ICD-10-CM | POA: Diagnosis not present

## 2024-03-04 DIAGNOSIS — R911 Solitary pulmonary nodule: Secondary | ICD-10-CM | POA: Diagnosis present

## 2024-03-04 DIAGNOSIS — E7800 Pure hypercholesterolemia, unspecified: Secondary | ICD-10-CM | POA: Diagnosis not present

## 2024-03-04 DIAGNOSIS — Z79899 Other long term (current) drug therapy: Secondary | ICD-10-CM

## 2024-03-04 DIAGNOSIS — I959 Hypotension, unspecified: Secondary | ICD-10-CM | POA: Diagnosis not present

## 2024-03-04 DIAGNOSIS — I25119 Atherosclerotic heart disease of native coronary artery with unspecified angina pectoris: Principal | ICD-10-CM | POA: Diagnosis present

## 2024-03-04 DIAGNOSIS — D72829 Elevated white blood cell count, unspecified: Secondary | ICD-10-CM | POA: Diagnosis not present

## 2024-03-04 DIAGNOSIS — I2510 Atherosclerotic heart disease of native coronary artery without angina pectoris: Secondary | ICD-10-CM

## 2024-03-04 DIAGNOSIS — R5381 Other malaise: Secondary | ICD-10-CM | POA: Diagnosis not present

## 2024-03-04 DIAGNOSIS — Z7901 Long term (current) use of anticoagulants: Secondary | ICD-10-CM | POA: Diagnosis not present

## 2024-03-04 DIAGNOSIS — K029 Dental caries, unspecified: Secondary | ICD-10-CM | POA: Diagnosis present

## 2024-03-04 DIAGNOSIS — I471 Supraventricular tachycardia, unspecified: Secondary | ICD-10-CM | POA: Diagnosis not present

## 2024-03-04 DIAGNOSIS — R7303 Prediabetes: Secondary | ICD-10-CM | POA: Diagnosis present

## 2024-03-04 DIAGNOSIS — Z8673 Personal history of transient ischemic attack (TIA), and cerebral infarction without residual deficits: Secondary | ICD-10-CM

## 2024-03-04 DIAGNOSIS — E039 Hypothyroidism, unspecified: Secondary | ICD-10-CM | POA: Diagnosis present

## 2024-03-04 DIAGNOSIS — Z8701 Personal history of pneumonia (recurrent): Secondary | ICD-10-CM

## 2024-03-04 DIAGNOSIS — Y832 Surgical operation with anastomosis, bypass or graft as the cause of abnormal reaction of the patient, or of later complication, without mention of misadventure at the time of the procedure: Secondary | ICD-10-CM | POA: Diagnosis not present

## 2024-03-04 DIAGNOSIS — F17210 Nicotine dependence, cigarettes, uncomplicated: Secondary | ICD-10-CM | POA: Diagnosis present

## 2024-03-04 DIAGNOSIS — I252 Old myocardial infarction: Secondary | ICD-10-CM | POA: Diagnosis not present

## 2024-03-04 DIAGNOSIS — Z602 Problems related to living alone: Secondary | ICD-10-CM | POA: Diagnosis present

## 2024-03-04 DIAGNOSIS — R739 Hyperglycemia, unspecified: Secondary | ICD-10-CM | POA: Diagnosis not present

## 2024-03-04 DIAGNOSIS — Z720 Tobacco use: Secondary | ICD-10-CM | POA: Diagnosis not present

## 2024-03-04 DIAGNOSIS — I4891 Unspecified atrial fibrillation: Secondary | ICD-10-CM | POA: Diagnosis present

## 2024-03-04 DIAGNOSIS — M4854XS Collapsed vertebra, not elsewhere classified, thoracic region, sequela of fracture: Secondary | ICD-10-CM | POA: Diagnosis not present

## 2024-03-04 DIAGNOSIS — I484 Atypical atrial flutter: Secondary | ICD-10-CM | POA: Diagnosis not present

## 2024-03-04 DIAGNOSIS — I97190 Other postprocedural cardiac functional disturbances following cardiac surgery: Secondary | ICD-10-CM | POA: Diagnosis not present

## 2024-03-04 DIAGNOSIS — R7309 Other abnormal glucose: Secondary | ICD-10-CM | POA: Diagnosis not present

## 2024-03-04 DIAGNOSIS — Z951 Presence of aortocoronary bypass graft: Secondary | ICD-10-CM | POA: Diagnosis not present

## 2024-03-04 DIAGNOSIS — N401 Enlarged prostate with lower urinary tract symptoms: Secondary | ICD-10-CM | POA: Diagnosis present

## 2024-03-04 DIAGNOSIS — K5900 Constipation, unspecified: Secondary | ICD-10-CM | POA: Diagnosis not present

## 2024-03-04 DIAGNOSIS — R338 Other retention of urine: Secondary | ICD-10-CM | POA: Diagnosis present

## 2024-03-04 DIAGNOSIS — J449 Chronic obstructive pulmonary disease, unspecified: Secondary | ICD-10-CM | POA: Diagnosis present

## 2024-03-04 DIAGNOSIS — E785 Hyperlipidemia, unspecified: Secondary | ICD-10-CM | POA: Diagnosis present

## 2024-03-04 DIAGNOSIS — Z7982 Long term (current) use of aspirin: Secondary | ICD-10-CM | POA: Diagnosis not present

## 2024-03-04 DIAGNOSIS — F1290 Cannabis use, unspecified, uncomplicated: Secondary | ICD-10-CM | POA: Diagnosis present

## 2024-03-04 DIAGNOSIS — R339 Retention of urine, unspecified: Secondary | ICD-10-CM | POA: Diagnosis not present

## 2024-03-04 DIAGNOSIS — Z9911 Dependence on respirator [ventilator] status: Secondary | ICD-10-CM

## 2024-03-04 DIAGNOSIS — D62 Acute posthemorrhagic anemia: Secondary | ICD-10-CM | POA: Diagnosis not present

## 2024-03-04 HISTORY — PX: CORONARY ARTERY BYPASS GRAFT: SHX141

## 2024-03-04 HISTORY — PX: TEE WITHOUT CARDIOVERSION: SHX5443

## 2024-03-04 HISTORY — DX: Collapsed vertebra, not elsewhere classified, site unspecified, initial encounter for fracture: M48.50XA

## 2024-03-04 LAB — POCT I-STAT 7, (LYTES, BLD GAS, ICA,H+H)
Acid-Base Excess: 0 mmol/L (ref 0.0–2.0)
Acid-base deficit: 3 mmol/L — ABNORMAL HIGH (ref 0.0–2.0)
Acid-base deficit: 4 mmol/L — ABNORMAL HIGH (ref 0.0–2.0)
Acid-base deficit: 10 mmol/L — ABNORMAL HIGH (ref 0.0–2.0)
Acid-base deficit: 2 mmol/L (ref 0.0–2.0)
Acid-base deficit: 6 mmol/L — ABNORMAL HIGH (ref 0.0–2.0)
Acid-base deficit: 4 mmol/L — ABNORMAL HIGH (ref 0.0–2.0)
Bicarbonate: 26.4 mmol/L (ref 20.0–28.0)
Bicarbonate: 23.5 mmol/L (ref 20.0–28.0)
Bicarbonate: 22.8 mmol/L (ref 20.0–28.0)
Bicarbonate: 15.4 mmol/L — ABNORMAL LOW (ref 20.0–28.0)
Bicarbonate: 23.1 mmol/L (ref 20.0–28.0)
Bicarbonate: 19.7 mmol/L — ABNORMAL LOW (ref 20.0–28.0)
Bicarbonate: 21.1 mmol/L (ref 20.0–28.0)
Calcium, Ion: 1.09 mmol/L — ABNORMAL LOW (ref 1.15–1.40)
Calcium, Ion: 1.28 mmol/L (ref 1.15–1.40)
Calcium, Ion: 1.22 mmol/L (ref 1.15–1.40)
Calcium, Ion: 0.99 mmol/L — ABNORMAL LOW (ref 1.15–1.40)
Calcium, Ion: 1.15 mmol/L (ref 1.15–1.40)
Calcium, Ion: 1.13 mmol/L — ABNORMAL LOW (ref 1.15–1.40)
Calcium, Ion: 1.13 mmol/L — ABNORMAL LOW (ref 1.15–1.40)
HCT: 33 % — ABNORMAL LOW (ref 39.0–52.0)
HCT: 33 % — ABNORMAL LOW (ref 39.0–52.0)
HCT: 34 % — ABNORMAL LOW (ref 39.0–52.0)
HCT: 28 % — ABNORMAL LOW (ref 39.0–52.0)
HCT: 35 % — ABNORMAL LOW (ref 39.0–52.0)
HCT: 30 % — ABNORMAL LOW (ref 39.0–52.0)
HCT: 29 % — ABNORMAL LOW (ref 39.0–52.0)
Hemoglobin: 11.2 g/dL — ABNORMAL LOW (ref 13.0–17.0)
Hemoglobin: 11.2 g/dL — ABNORMAL LOW (ref 13.0–17.0)
Hemoglobin: 11.6 g/dL — ABNORMAL LOW (ref 13.0–17.0)
Hemoglobin: 9.5 g/dL — ABNORMAL LOW (ref 13.0–17.0)
Hemoglobin: 11.9 g/dL — ABNORMAL LOW (ref 13.0–17.0)
Hemoglobin: 10.2 g/dL — ABNORMAL LOW (ref 13.0–17.0)
Hemoglobin: 9.9 g/dL — ABNORMAL LOW (ref 13.0–17.0)
O2 Saturation: 100 %
O2 Saturation: 96 %
O2 Saturation: 98 %
O2 Saturation: 98 %
O2 Saturation: 99 %
O2 Saturation: 97 %
O2 Saturation: 95 %
Patient temperature: 36.5
Patient temperature: 36.9
Patient temperature: 38.2
Patient temperature: 38.5
Patient temperature: 38.5
Potassium: 4.9 mmol/L (ref 3.5–5.1)
Potassium: 5.2 mmol/L — ABNORMAL HIGH (ref 3.5–5.1)
Potassium: 4.5 mmol/L (ref 3.5–5.1)
Potassium: 3.1 mmol/L — ABNORMAL LOW (ref 3.5–5.1)
Potassium: 4.2 mmol/L (ref 3.5–5.1)
Potassium: 3.5 mmol/L (ref 3.5–5.1)
Potassium: 3.4 mmol/L — ABNORMAL LOW (ref 3.5–5.1)
Sodium: 135 mmol/L (ref 135–145)
Sodium: 136 mmol/L (ref 135–145)
Sodium: 137 mmol/L (ref 135–145)
Sodium: 144 mmol/L (ref 135–145)
Sodium: 137 mmol/L (ref 135–145)
Sodium: 140 mmol/L (ref 135–145)
Sodium: 139 mmol/L (ref 135–145)
TCO2: 28 mmol/L (ref 22–32)
TCO2: 25 mmol/L (ref 22–32)
TCO2: 24 mmol/L (ref 22–32)
TCO2: 16 mmol/L — ABNORMAL LOW (ref 22–32)
TCO2: 24 mmol/L (ref 22–32)
TCO2: 21 mmol/L — ABNORMAL LOW (ref 22–32)
TCO2: 22 mmol/L (ref 22–32)
pCO2 arterial: 51.8 mmHg — ABNORMAL HIGH (ref 32–48)
pCO2 arterial: 48.8 mmHg — ABNORMAL HIGH (ref 32–48)
pCO2 arterial: 49.2 mmHg — ABNORMAL HIGH (ref 32–48)
pCO2 arterial: 32.8 mmHg (ref 32–48)
pCO2 arterial: 43.4 mmHg (ref 32–48)
pCO2 arterial: 40.1 mmHg (ref 32–48)
pCO2 arterial: 41.3 mmHg (ref 32–48)
pH, Arterial: 7.314 — ABNORMAL LOW (ref 7.35–7.45)
pH, Arterial: 7.29 — ABNORMAL LOW (ref 7.35–7.45)
pH, Arterial: 7.271 — ABNORMAL LOW (ref 7.35–7.45)
pH, Arterial: 7.28 — ABNORMAL LOW (ref 7.35–7.45)
pH, Arterial: 7.34 — ABNORMAL LOW (ref 7.35–7.45)
pH, Arterial: 7.306 — ABNORMAL LOW (ref 7.35–7.45)
pH, Arterial: 7.324 — ABNORMAL LOW (ref 7.35–7.45)
pO2, Arterial: 209 mmHg — ABNORMAL HIGH (ref 83–108)
pO2, Arterial: 93 mmHg (ref 83–108)
pO2, Arterial: 112 mmHg — ABNORMAL HIGH (ref 83–108)
pO2, Arterial: 123 mmHg — ABNORMAL HIGH (ref 83–108)
pO2, Arterial: 130 mmHg — ABNORMAL HIGH (ref 83–108)
pO2, Arterial: 110 mmHg — ABNORMAL HIGH (ref 83–108)
pO2, Arterial: 88 mmHg (ref 83–108)

## 2024-03-04 LAB — CBC
HCT: 33.9 % — ABNORMAL LOW (ref 39.0–52.0)
HCT: 33.6 % — ABNORMAL LOW (ref 39.0–52.0)
Hemoglobin: 11.2 g/dL — ABNORMAL LOW (ref 13.0–17.0)
Hemoglobin: 11.5 g/dL — ABNORMAL LOW (ref 13.0–17.0)
MCH: 31.5 pg (ref 26.0–34.0)
MCH: 31.9 pg (ref 26.0–34.0)
MCHC: 33 g/dL (ref 30.0–36.0)
MCHC: 34.2 g/dL (ref 30.0–36.0)
MCV: 95.5 fL (ref 80.0–100.0)
MCV: 93.1 fL (ref 80.0–100.0)
Platelets: 169 10*3/uL (ref 150–400)
Platelets: 174 10*3/uL (ref 150–400)
RBC: 3.55 MIL/uL — ABNORMAL LOW (ref 4.22–5.81)
RBC: 3.61 MIL/uL — ABNORMAL LOW (ref 4.22–5.81)
RDW: 12.8 % (ref 11.5–15.5)
RDW: 12.8 % (ref 11.5–15.5)
WBC: 19.7 10*3/uL — ABNORMAL HIGH (ref 4.0–10.5)
WBC: 14.7 10*3/uL — ABNORMAL HIGH (ref 4.0–10.5)
nRBC: 0 % (ref 0.0–0.2)
nRBC: 0 % (ref 0.0–0.2)

## 2024-03-04 LAB — POCT I-STAT, CHEM 8
BUN: 16 mg/dL (ref 8–23)
BUN: 16 mg/dL (ref 8–23)
BUN: 15 mg/dL (ref 8–23)
BUN: 13 mg/dL (ref 8–23)
Calcium, Ion: 1.23 mmol/L (ref 1.15–1.40)
Calcium, Ion: 1.23 mmol/L (ref 1.15–1.40)
Calcium, Ion: 1.07 mmol/L — ABNORMAL LOW (ref 1.15–1.40)
Calcium, Ion: 1.27 mmol/L (ref 1.15–1.40)
Chloride: 104 mmol/L (ref 98–111)
Chloride: 105 mmol/L (ref 98–111)
Chloride: 104 mmol/L (ref 98–111)
Chloride: 105 mmol/L (ref 98–111)
Creatinine, Ser: 1.1 mg/dL (ref 0.61–1.24)
Creatinine, Ser: 1.1 mg/dL (ref 0.61–1.24)
Creatinine, Ser: 1 mg/dL (ref 0.61–1.24)
Creatinine, Ser: 0.9 mg/dL (ref 0.61–1.24)
Glucose, Bld: 126 mg/dL — ABNORMAL HIGH (ref 70–99)
Glucose, Bld: 137 mg/dL — ABNORMAL HIGH (ref 70–99)
Glucose, Bld: 128 mg/dL — ABNORMAL HIGH (ref 70–99)
Glucose, Bld: 120 mg/dL — ABNORMAL HIGH (ref 70–99)
HCT: 42 % (ref 39.0–52.0)
HCT: 42 % (ref 39.0–52.0)
HCT: 33 % — ABNORMAL LOW (ref 39.0–52.0)
HCT: 34 % — ABNORMAL LOW (ref 39.0–52.0)
Hemoglobin: 14.3 g/dL (ref 13.0–17.0)
Hemoglobin: 14.3 g/dL (ref 13.0–17.0)
Hemoglobin: 11.2 g/dL — ABNORMAL LOW (ref 13.0–17.0)
Hemoglobin: 11.6 g/dL — ABNORMAL LOW (ref 13.0–17.0)
Potassium: 4.8 mmol/L (ref 3.5–5.1)
Potassium: 5.3 mmol/L — ABNORMAL HIGH (ref 3.5–5.1)
Potassium: 6.3 mmol/L (ref 3.5–5.1)
Potassium: 5.2 mmol/L — ABNORMAL HIGH (ref 3.5–5.1)
Sodium: 136 mmol/L (ref 135–145)
Sodium: 135 mmol/L (ref 135–145)
Sodium: 133 mmol/L — ABNORMAL LOW (ref 135–145)
Sodium: 134 mmol/L — ABNORMAL LOW (ref 135–145)
TCO2: 25 mmol/L (ref 22–32)
TCO2: 25 mmol/L (ref 22–32)
TCO2: 26 mmol/L (ref 22–32)
TCO2: 23 mmol/L (ref 22–32)

## 2024-03-04 LAB — BASIC METABOLIC PANEL
Anion gap: 6 (ref 5–15)
BUN: 11 mg/dL (ref 8–23)
CO2: 23 mmol/L (ref 22–32)
Calcium: 8.1 mg/dL — ABNORMAL LOW (ref 8.9–10.3)
Chloride: 107 mmol/L (ref 98–111)
Creatinine, Ser: 0.95 mg/dL (ref 0.61–1.24)
GFR, Estimated: >60 mL/min (ref >60)
Glucose, Bld: 128 mg/dL — ABNORMAL HIGH (ref 70–99)
Potassium: 4.2 mmol/L (ref 3.5–5.1)
Sodium: 136 mmol/L (ref 135–145)

## 2024-03-04 LAB — GLUCOSE, CAPILLARY
Glucose-Capillary: 155 mg/dL — ABNORMAL HIGH (ref 70–99)
Glucose-Capillary: 93 mg/dL (ref 70–99)
Glucose-Capillary: 130 mg/dL — ABNORMAL HIGH (ref 70–99)
Glucose-Capillary: 132 mg/dL — ABNORMAL HIGH (ref 70–99)
Glucose-Capillary: 103 mg/dL — ABNORMAL HIGH (ref 70–99)
Glucose-Capillary: 111 mg/dL — ABNORMAL HIGH (ref 70–99)
Glucose-Capillary: 117 mg/dL — ABNORMAL HIGH (ref 70–99)
Glucose-Capillary: 153 mg/dL — ABNORMAL HIGH (ref 70–99)
Glucose-Capillary: 114 mg/dL — ABNORMAL HIGH (ref 70–99)

## 2024-03-04 LAB — HEMOGLOBIN AND HEMATOCRIT, BLOOD
HCT: 33.1 % — ABNORMAL LOW (ref 39.0–52.0)
Hemoglobin: 11.2 g/dL — ABNORMAL LOW (ref 13.0–17.0)

## 2024-03-04 LAB — ECHO INTRAOPERATIVE TEE
Height: 75 in
Weight: 3584 [oz_av]

## 2024-03-04 LAB — POCT I-STAT EG7
Acid-base deficit: 1 mmol/L (ref 0.0–2.0)
Bicarbonate: 25.5 mmol/L (ref 20.0–28.0)
Calcium, Ion: 1.08 mmol/L — ABNORMAL LOW (ref 1.15–1.40)
HCT: 33 % — ABNORMAL LOW (ref 39.0–52.0)
Hemoglobin: 11.2 g/dL — ABNORMAL LOW (ref 13.0–17.0)
O2 Saturation: 84 %
Potassium: 6.3 mmol/L (ref 3.5–5.1)
Sodium: 134 mmol/L — ABNORMAL LOW (ref 135–145)
TCO2: 27 mmol/L (ref 22–32)
pCO2, Ven: 48.3 mmHg (ref 44–60)
pH, Ven: 7.331 (ref 7.25–7.43)
pO2, Ven: 52 mmHg — ABNORMAL HIGH (ref 32–45)

## 2024-03-04 LAB — MAGNESIUM: Magnesium: 2.8 mg/dL — ABNORMAL HIGH (ref 1.7–2.4)

## 2024-03-04 LAB — ABO/RH: ABO/RH(D): A POS

## 2024-03-04 LAB — PROTIME-INR
INR: 1.3 — ABNORMAL HIGH (ref 0.8–1.2)
Prothrombin Time: 16.2 s — ABNORMAL HIGH (ref 11.4–15.2)

## 2024-03-04 LAB — PLATELET COUNT: Platelets: 175 10*3/uL (ref 150–400)

## 2024-03-04 LAB — APTT: aPTT: 37 s — ABNORMAL HIGH (ref 24–36)

## 2024-03-04 SURGERY — CORONARY ARTERY BYPASS GRAFTING (CABG)
Anesthesia: General | Site: Chest

## 2024-03-04 MED ORDER — ~~LOC~~ CARDIAC SURGERY, PATIENT & FAMILY EDUCATION
Freq: Once | Status: DC
Start: 2024-03-04 — End: 2024-03-04
  Filled 2024-03-04: qty 1

## 2024-03-04 MED ORDER — ROCURONIUM BROMIDE 10 MG/ML (PF) SYRINGE
PREFILLED_SYRINGE | INTRAVENOUS | Status: AC
  Filled 2024-03-04: qty 20

## 2024-03-04 MED ORDER — LIDOCAINE 2% (20 MG/ML) 5 ML SYRINGE
INTRAMUSCULAR | Status: AC
  Filled 2024-03-04: qty 5

## 2024-03-04 MED ORDER — HEPARIN SODIUM (PORCINE) 1000 UNIT/ML IJ SOLN
INTRAMUSCULAR | Status: AC
  Filled 2024-03-04: qty 1

## 2024-03-04 MED ORDER — CHLORHEXIDINE GLUCONATE 0.12 % MT SOLN
15.0000 mL | OROMUCOSAL | Status: AC
  Administered 2024-03-04: 15 mL via OROMUCOSAL
  Filled 2024-03-04: qty 15

## 2024-03-04 MED ORDER — SODIUM CHLORIDE 0.9% FLUSH
3.0000 mL | Freq: Two times a day (BID) | INTRAVENOUS | Status: DC
  Administered 2024-03-04 – 2024-03-06 (×5): 10 mL via INTRAVENOUS

## 2024-03-04 MED ORDER — POTASSIUM CHLORIDE 10 MEQ/50ML IV SOLN: 10.0000 meq | INTRAVENOUS | Status: AC

## 2024-03-04 MED ORDER — PROPOFOL 10 MG/ML IV BOLUS
INTRAVENOUS | Status: AC
  Filled 2024-03-04: qty 20

## 2024-03-04 MED ORDER — ACETAMINOPHEN 160 MG/5ML PO SOLN: 1000.0000 mg | Freq: Four times a day (QID) | ORAL | Status: AC

## 2024-03-04 MED ORDER — FENTANYL CITRATE (PF) 250 MCG/5ML IJ SOLN
INTRAMUSCULAR | Status: AC
  Filled 2024-03-04: qty 5

## 2024-03-04 MED ORDER — METOPROLOL TARTRATE 25 MG/10 ML ORAL SUSPENSION
12.5000 mg | Freq: Two times a day (BID) | ORAL | Status: DC
  Filled 2024-03-04 (×2): qty 5

## 2024-03-04 MED ORDER — 0.9 % SODIUM CHLORIDE (POUR BTL) OPTIME
TOPICAL | Status: DC | PRN
Start: 2024-03-04 — End: 2024-03-04
  Administered 2024-03-04: 5000 mL

## 2024-03-04 MED ORDER — DEXMEDETOMIDINE HCL IN NACL 400 MCG/100ML IV SOLN
0.0000 ug/kg/h | INTRAVENOUS | Status: DC
  Administered 2024-03-04: 0.7 ug/kg/h via INTRAVENOUS
  Filled 2024-03-04: qty 100

## 2024-03-04 MED ORDER — METOPROLOL TARTRATE 12.5 MG HALF TABLET: 12.5000 mg | ORAL_TABLET | Freq: Once | ORAL | Status: DC

## 2024-03-04 MED ORDER — PANTOPRAZOLE SODIUM 40 MG PO TBEC
40.0000 mg | DELAYED_RELEASE_TABLET | Freq: Every day | ORAL | Status: DC
  Administered 2024-03-06 – 2024-03-13 (×8): 40 mg via ORAL
  Filled 2024-03-04 (×8): qty 1

## 2024-03-04 MED ORDER — SODIUM CHLORIDE (PF) 0.9 % IJ SOLN: OROMUCOSAL | Status: DC | PRN

## 2024-03-04 MED ORDER — MIDAZOLAM HCL 2 MG/2ML IJ SOLN
INTRAMUSCULAR | Status: AC
  Filled 2024-03-04: qty 2

## 2024-03-04 MED ORDER — NICARDIPINE HCL IN NACL 20-0.86 MG/200ML-% IV SOLN: 0.0000 mg/h | INTRAVENOUS | Status: DC

## 2024-03-04 MED ORDER — MAGNESIUM SULFATE 4 GM/100ML IV SOLN
4.0000 g | Freq: Once | INTRAVENOUS | Status: AC
  Administered 2024-03-04: 4 g via INTRAVENOUS
  Filled 2024-03-04: qty 100

## 2024-03-04 MED ORDER — DOBUTAMINE-DEXTROSE 4-5 MG/ML-% IV SOLN: 0.0000 ug/kg/min | INTRAVENOUS | Status: DC

## 2024-03-04 MED ORDER — BISACODYL 10 MG RE SUPP: 10.0000 mg | Freq: Every day | RECTAL | Status: DC

## 2024-03-04 MED ORDER — PROTAMINE SULFATE 10 MG/ML IV SOLN
INTRAVENOUS | Status: DC | PRN
  Administered 2024-03-04: 300 mg via INTRAVENOUS

## 2024-03-04 MED ORDER — SODIUM CHLORIDE 0.45 % IV SOLN: INTRAVENOUS | Status: AC | PRN

## 2024-03-04 MED ORDER — MORPHINE SULFATE (PF) 2 MG/ML IV SOLN
1.0000 mg | INTRAVENOUS | Status: DC | PRN
  Administered 2024-03-04: 4 mg via INTRAVENOUS
  Administered 2024-03-04: 2 mg via INTRAVENOUS
  Administered 2024-03-04 – 2024-03-05 (×6): 4 mg via INTRAVENOUS
  Filled 2024-03-04 (×5): qty 2
  Filled 2024-03-04 (×2): qty 1
  Filled 2024-03-04: qty 2
  Filled 2024-03-04: qty 1

## 2024-03-04 MED ORDER — PANTOPRAZOLE SODIUM 40 MG IV SOLR
40.0000 mg | Freq: Every day | INTRAVENOUS | Status: AC
  Administered 2024-03-04 – 2024-03-05 (×2): 40 mg via INTRAVENOUS
  Filled 2024-03-04 (×2): qty 10

## 2024-03-04 MED ORDER — OXYCODONE HCL 5 MG PO TABS
5.0000 mg | ORAL_TABLET | ORAL | Status: DC | PRN
  Administered 2024-03-04 – 2024-03-08 (×16): 10 mg via ORAL
  Administered 2024-03-09 (×4): 5 mg via ORAL
  Administered 2024-03-10 – 2024-03-12 (×4): 10 mg via ORAL
  Administered 2024-03-13: 5 mg via ORAL
  Filled 2024-03-04 (×3): qty 2
  Filled 2024-03-04: qty 1
  Filled 2024-03-04 (×2): qty 2
  Filled 2024-03-04: qty 1
  Filled 2024-03-04 (×18): qty 2

## 2024-03-04 MED ORDER — ACETAMINOPHEN 160 MG/5ML PO SOLN
650.0000 mg | Freq: Once | ORAL | Status: AC
  Administered 2024-03-04: 650 mg
  Filled 2024-03-04: qty 20.3

## 2024-03-04 MED ORDER — PLASMA-LYTE A IV SOLN: INTRAVENOUS | Status: DC | PRN

## 2024-03-04 MED ORDER — ASPIRIN 325 MG PO TBEC
325.0000 mg | DELAYED_RELEASE_TABLET | Freq: Every day | ORAL | Status: DC
  Administered 2024-03-05 – 2024-03-10 (×6): 325 mg via ORAL
  Filled 2024-03-04 (×6): qty 1

## 2024-03-04 MED ORDER — TRAMADOL HCL 50 MG PO TABS
50.0000 mg | ORAL_TABLET | ORAL | Status: DC | PRN
  Administered 2024-03-04 – 2024-03-06 (×2): 100 mg via ORAL
  Filled 2024-03-04 (×2): qty 2

## 2024-03-04 MED ORDER — LACTATED RINGERS IV SOLN: INTRAVENOUS | Status: AC

## 2024-03-04 MED ORDER — PROPOFOL 10 MG/ML IV BOLUS
INTRAVENOUS | Status: DC | PRN
  Administered 2024-03-04: 30 mg via INTRAVENOUS
  Administered 2024-03-04: 40 mg via INTRAVENOUS

## 2024-03-04 MED ORDER — SODIUM CHLORIDE 0.9% FLUSH: 3.0000 mL | INTRAVENOUS | Status: DC | PRN

## 2024-03-04 MED ORDER — MIDAZOLAM HCL 2 MG/2ML IJ SOLN
2.0000 mg | INTRAMUSCULAR | Status: DC | PRN
  Administered 2024-03-04: 2 mg via INTRAVENOUS
  Filled 2024-03-04: qty 2

## 2024-03-04 MED ORDER — HEPARIN SODIUM (PORCINE) 1000 UNIT/ML IJ SOLN
INTRAMUSCULAR | Status: AC
  Filled 2024-03-04: qty 10

## 2024-03-04 MED ORDER — SODIUM CHLORIDE 0.9 % IV SOLN: 250.0000 mL | INTRAVENOUS | Status: DC

## 2024-03-04 MED ORDER — ORAL CARE MOUTH RINSE: 15.0000 mL | OROMUCOSAL | Status: DC | PRN

## 2024-03-04 MED ORDER — FENTANYL CITRATE (PF) 250 MCG/5ML IJ SOLN
INTRAMUSCULAR | Status: AC
Start: 2024-03-04 — End: ?
  Filled 2024-03-04: qty 5

## 2024-03-04 MED ORDER — ATORVASTATIN CALCIUM 10 MG PO TABS
20.0000 mg | ORAL_TABLET | Freq: Every day | ORAL | Status: DC
  Administered 2024-03-05: 20 mg via ORAL
  Filled 2024-03-04: qty 2

## 2024-03-04 MED ORDER — LACTATED RINGERS IV SOLN: INTRAVENOUS | Status: DC | PRN

## 2024-03-04 MED ORDER — MIDAZOLAM HCL (PF) 5 MG/ML IJ SOLN
INTRAMUSCULAR | Status: DC | PRN
  Administered 2024-03-04: 2 mg via INTRAVENOUS
  Administered 2024-03-04: 1 mg via INTRAVENOUS
  Administered 2024-03-04: 2 mg via INTRAVENOUS
  Administered 2024-03-04: 1 mg via INTRAVENOUS

## 2024-03-04 MED ORDER — DEXTROSE 50 % IV SOLN: 0.0000 mL | INTRAVENOUS | Status: DC | PRN

## 2024-03-04 MED ORDER — SODIUM BICARBONATE 8.4 % IV SOLN
50.0000 meq | Freq: Once | INTRAVENOUS | Status: AC
  Administered 2024-03-04: 50 meq via INTRAVENOUS

## 2024-03-04 MED ORDER — ONDANSETRON HCL 4 MG/2ML IJ SOLN
4.0000 mg | Freq: Four times a day (QID) | INTRAMUSCULAR | Status: DC | PRN
  Administered 2024-03-04: 4 mg via INTRAVENOUS
  Filled 2024-03-04 (×2): qty 2

## 2024-03-04 MED ORDER — METOCLOPRAMIDE HCL 5 MG/ML IJ SOLN
10.0000 mg | Freq: Four times a day (QID) | INTRAMUSCULAR | Status: AC
  Administered 2024-03-04 – 2024-03-05 (×5): 10 mg via INTRAVENOUS
  Filled 2024-03-04 (×5): qty 2

## 2024-03-04 MED ORDER — PHENYLEPHRINE HCL (PRESSORS) 10 MG/ML IV SOLN
INTRAVENOUS | Status: DC | PRN
  Administered 2024-03-04 (×2): 80 ug via INTRAVENOUS
  Administered 2024-03-04: 40 ug via INTRAVENOUS
  Administered 2024-03-04: 80 ug via INTRAVENOUS

## 2024-03-04 MED ORDER — CHLORHEXIDINE GLUCONATE CLOTH 2 % EX PADS
6.0000 | MEDICATED_PAD | Freq: Every day | CUTANEOUS | Status: DC
  Administered 2024-03-05 – 2024-03-06 (×2): 6 via TOPICAL

## 2024-03-04 MED ORDER — CHLORHEXIDINE GLUCONATE 0.12 % MT SOLN
15.0000 mL | Freq: Once | OROMUCOSAL | Status: AC
  Administered 2024-03-04: 15 mL via OROMUCOSAL
  Filled 2024-03-04: qty 15

## 2024-03-04 MED ORDER — ASPIRIN 81 MG PO CHEW
324.0000 mg | CHEWABLE_TABLET | Freq: Once | ORAL | Status: AC
  Administered 2024-03-04: 324 mg via ORAL
  Filled 2024-03-04: qty 4

## 2024-03-04 MED ORDER — HEPARIN SODIUM (PORCINE) 1000 UNIT/ML IJ SOLN
INTRAMUSCULAR | Status: DC | PRN
  Administered 2024-03-04: 33000 [IU] via INTRAVENOUS

## 2024-03-04 MED ORDER — PROTAMINE SULFATE 10 MG/ML IV SOLN
INTRAVENOUS | Status: AC
  Filled 2024-03-04: qty 25

## 2024-03-04 MED ORDER — PHENYLEPHRINE HCL-NACL 20-0.9 MG/250ML-% IV SOLN
0.0000 ug/min | INTRAVENOUS | Status: DC
  Administered 2024-03-04: 55 ug/min via INTRAVENOUS
  Administered 2024-03-05: 110 ug/min via INTRAVENOUS
  Administered 2024-03-05: 40 ug/min via INTRAVENOUS
  Filled 2024-03-04 (×3): qty 250

## 2024-03-04 MED ORDER — ALBUMIN HUMAN 5 % IV SOLN
250.0000 mL | INTRAVENOUS | Status: AC | PRN
  Administered 2024-03-04 – 2024-03-05 (×5): 12.5 g via INTRAVENOUS
  Filled 2024-03-04 (×3): qty 250

## 2024-03-04 MED ORDER — IPRATROPIUM-ALBUTEROL 0.5-2.5 (3) MG/3ML IN SOLN: 3.0000 mL | RESPIRATORY_TRACT | Status: DC | PRN

## 2024-03-04 MED ORDER — METOPROLOL TARTRATE 12.5 MG HALF TABLET
12.5000 mg | ORAL_TABLET | Freq: Two times a day (BID) | ORAL | Status: DC
  Administered 2024-03-05 – 2024-03-06 (×3): 12.5 mg via ORAL
  Filled 2024-03-04 (×3): qty 1

## 2024-03-04 MED ORDER — SODIUM CHLORIDE 0.9 % IV SOLN: INTRAVENOUS | Status: DC | PRN

## 2024-03-04 MED ORDER — DOCUSATE SODIUM 100 MG PO CAPS
200.0000 mg | ORAL_CAPSULE | Freq: Every day | ORAL | Status: DC
  Administered 2024-03-05 – 2024-03-06 (×2): 200 mg via ORAL
  Filled 2024-03-04 (×2): qty 2

## 2024-03-04 MED ORDER — ROCURONIUM BROMIDE 100 MG/10ML IV SOLN
INTRAVENOUS | Status: DC | PRN
  Administered 2024-03-04: 20 mg via INTRAVENOUS
  Administered 2024-03-04 (×2): 50 mg via INTRAVENOUS
  Administered 2024-03-04: 100 mg via INTRAVENOUS

## 2024-03-04 MED ORDER — SODIUM CHLORIDE 0.9% FLUSH
3.0000 mL | Freq: Two times a day (BID) | INTRAVENOUS | Status: DC
  Administered 2024-03-05 – 2024-03-06 (×3): 3 mL via INTRAVENOUS

## 2024-03-04 MED ORDER — PHENYLEPHRINE 80 MCG/ML (10ML) SYRINGE FOR IV PUSH (FOR BLOOD PRESSURE SUPPORT)
PREFILLED_SYRINGE | INTRAVENOUS | Status: AC
  Filled 2024-03-04: qty 10

## 2024-03-04 MED ORDER — CEFAZOLIN SODIUM-DEXTROSE 2-4 GM/100ML-% IV SOLN
2.0000 g | Freq: Three times a day (TID) | INTRAVENOUS | Status: DC
  Administered 2024-03-04 – 2024-03-05 (×5): 2 g via INTRAVENOUS
  Filled 2024-03-04 (×6): qty 100

## 2024-03-04 MED ORDER — VANCOMYCIN HCL IN DEXTROSE 1-5 GM/200ML-% IV SOLN
1000.0000 mg | Freq: Once | INTRAVENOUS | Status: AC
  Administered 2024-03-04: 1000 mg via INTRAVENOUS
  Filled 2024-03-04: qty 200

## 2024-03-04 MED ORDER — NOREPINEPHRINE 4 MG/250ML-% IV SOLN
0.0000 ug/min | INTRAVENOUS | Status: DC
  Administered 2024-03-04: 2 ug/min via INTRAVENOUS
  Filled 2024-03-04: qty 250

## 2024-03-04 MED ORDER — FENTANYL CITRATE (PF) 100 MCG/2ML IJ SOLN
INTRAMUSCULAR | Status: DC | PRN
  Administered 2024-03-04: 50 ug via INTRAVENOUS
  Administered 2024-03-04 (×3): 100 ug via INTRAVENOUS
  Administered 2024-03-04: 50 ug via INTRAVENOUS
  Administered 2024-03-04: 100 ug via INTRAVENOUS
  Administered 2024-03-04: 50 ug via INTRAVENOUS
  Administered 2024-03-04 (×2): 150 ug via INTRAVENOUS
  Administered 2024-03-04: 100 ug via INTRAVENOUS

## 2024-03-04 MED ORDER — PROTAMINE SULFATE 10 MG/ML IV SOLN
INTRAVENOUS | Status: AC
  Filled 2024-03-04: qty 5

## 2024-03-04 MED ORDER — INSULIN REGULAR(HUMAN) IN NACL 100-0.9 UT/100ML-% IV SOLN: INTRAVENOUS | Status: DC

## 2024-03-04 MED ORDER — METOPROLOL TARTRATE 5 MG/5ML IV SOLN: 2.5000 mg | INTRAVENOUS | Status: DC | PRN

## 2024-03-04 MED ORDER — BISACODYL 5 MG PO TBEC
10.0000 mg | DELAYED_RELEASE_TABLET | Freq: Every day | ORAL | Status: DC
  Administered 2024-03-05 – 2024-03-11 (×7): 10 mg via ORAL
  Filled 2024-03-04 (×9): qty 2

## 2024-03-04 MED ORDER — ASPIRIN 81 MG PO CHEW: 324.0000 mg | CHEWABLE_TABLET | Freq: Every day | ORAL | Status: DC

## 2024-03-04 MED ORDER — ALBUMIN HUMAN 5 % IV SOLN: INTRAVENOUS | Status: DC | PRN

## 2024-03-04 MED ORDER — CHLORHEXIDINE GLUCONATE 4 % EX SOLN: 30.0000 mL | CUTANEOUS | Status: DC

## 2024-03-04 MED ORDER — ACETAMINOPHEN 500 MG PO TABS
1000.0000 mg | ORAL_TABLET | Freq: Four times a day (QID) | ORAL | Status: AC
  Administered 2024-03-04 – 2024-03-09 (×19): 1000 mg via ORAL
  Filled 2024-03-04 (×18): qty 2

## 2024-03-04 SURGICAL SUPPLY — 73 items
BAG DECANTER FOR FLEXI CONT (MISCELLANEOUS) ×2 IMPLANT
BLADE CLIPPER SURG (BLADE) ×2 IMPLANT
BLADE STERNUM SYSTEM 6 (BLADE) ×2 IMPLANT
BNDG ELASTIC 4INX 5YD STR LF (GAUZE/BANDAGES/DRESSINGS) IMPLANT
BNDG ELASTIC 6INX 5YD STR LF (GAUZE/BANDAGES/DRESSINGS) ×2 IMPLANT
BNDG GAUZE DERMACEA FLUFF 4 (GAUZE/BANDAGES/DRESSINGS) ×2 IMPLANT
CABLE SURGICAL S-101-97-12 (CABLE) ×2 IMPLANT
CANNULA MC2 2 STG 29/37 NON-V (CANNULA) ×2 IMPLANT
CANNULA NON VENT 22FR 12 (CANNULA) IMPLANT
CATH ROBINSON RED A/P 18FR (CATHETERS) ×4 IMPLANT
CLIP RETRACTION 3.0MM CORONARY (MISCELLANEOUS) IMPLANT
CLIP TI MEDIUM 24 (CLIP) IMPLANT
CLIP TI WIDE RED SMALL 24 (CLIP) IMPLANT
CONN ST 1/2X1/2 BEN (MISCELLANEOUS) ×2 IMPLANT
CONNECTOR BLAKE 2:1 CARIO BLK (MISCELLANEOUS) ×2 IMPLANT
CONTAINER PROTECT SURGISLUSH (MISCELLANEOUS) ×4 IMPLANT
DERMABOND ADVANCED .7 DNX12 (GAUZE/BANDAGES/DRESSINGS) IMPLANT
DRAIN CHANNEL 19F RND (DRAIN) ×6 IMPLANT
DRAIN CONNECTOR BLAKE 1:1 (MISCELLANEOUS) ×2 IMPLANT
DRAPE INCISE IOBAN 66X45 STRL (DRAPES) IMPLANT
DRAPE SRG 135X102X78XABS (DRAPES) ×2 IMPLANT
DRAPE WARM FLUID 44X44 (DRAPES) ×2 IMPLANT
DRSG AQUACEL AG ADV 3.5X10 (GAUZE/BANDAGES/DRESSINGS) ×2 IMPLANT
ELECT BLADE 4.0 EZ CLEAN MEGAD (MISCELLANEOUS) ×2 IMPLANT
ELECT REM PT RETURN 9FT ADLT (ELECTROSURGICAL) ×4 IMPLANT
ELECTRODE BLDE 4.0 EZ CLN MEGD (MISCELLANEOUS) ×2 IMPLANT
ELECTRODE REM PT RTRN 9FT ADLT (ELECTROSURGICAL) ×4 IMPLANT
FELT TEFLON 1X6 (MISCELLANEOUS) ×4 IMPLANT
GAUZE 4X4 16PLY ~~LOC~~+RFID DBL (SPONGE) ×2 IMPLANT
GAUZE SPONGE 4X4 12PLY STRL (GAUZE/BANDAGES/DRESSINGS) ×4 IMPLANT
GAUZE SPONGE 4X4 12PLY STRL LF (GAUZE/BANDAGES/DRESSINGS) IMPLANT
GLOVE BIO SURGEON STRL SZ7 (GLOVE) ×4 IMPLANT
GLOVE BIOGEL M STRL SZ7.5 (GLOVE) ×4 IMPLANT
GOWN STRL REUS W/ TWL LRG LVL3 (GOWN DISPOSABLE) ×8 IMPLANT
GOWN STRL REUS W/ TWL XL LVL3 (GOWN DISPOSABLE) ×4 IMPLANT
HEMOSTAT POWDER SURGIFOAM 1G (HEMOSTASIS) ×4 IMPLANT
INSERT SUTURE HOLDER (MISCELLANEOUS) ×2 IMPLANT
KIT BASIN OR (CUSTOM PROCEDURE TRAY) ×2 IMPLANT
KIT SUCTION CATH 14FR (SUCTIONS) ×2 IMPLANT
KIT TURNOVER KIT B (KITS) ×2 IMPLANT
KIT VASOVIEW HEMOPRO 2 VH 4000 (KITS) ×2 IMPLANT
LEAD PACING MYOCARDI (MISCELLANEOUS) ×2 IMPLANT
MARKER GRAFT CORONARY BYPASS (MISCELLANEOUS) ×6 IMPLANT
NS IRRIG 1000ML POUR BTL (IV SOLUTION) ×10 IMPLANT
PACK E OPEN HEART (SUTURE) ×2 IMPLANT
PACK OPEN HEART (CUSTOM PROCEDURE TRAY) ×2 IMPLANT
PAD ARMBOARD POSITIONER FOAM (MISCELLANEOUS) ×4 IMPLANT
PAD ELECT DEFIB RADIOL ZOLL (MISCELLANEOUS) ×2 IMPLANT
PENCIL BUTTON HOLSTER BLD 10FT (ELECTRODE) ×2 IMPLANT
POSITIONER HEAD DONUT 9IN (MISCELLANEOUS) ×2 IMPLANT
PUNCH AORTIC ROTATE 4.0MM (MISCELLANEOUS) ×2 IMPLANT
SET MPS 3-ND DEL (MISCELLANEOUS) IMPLANT
SPONGE T-LAP 18X18 ~~LOC~~+RFID (SPONGE) ×8 IMPLANT
STOPCOCK 4 WAY LG BORE MALE ST (IV SETS) IMPLANT
SUPPORT HEART JANKE-BARRON (MISCELLANEOUS) ×2 IMPLANT
SUT BONE WAX W31G (SUTURE) ×2 IMPLANT
SUT ETHIBOND X763 2 0 SH 1 (SUTURE) ×4 IMPLANT
SUT MNCRL AB 3-0 PS2 18 (SUTURE) ×4 IMPLANT
SUT PDS AB 1 CTX 36 (SUTURE) ×4 IMPLANT
SUT PROLENE 4 0 SH DA (SUTURE) ×2 IMPLANT
SUT PROLENE 5 0 C 1 36 (SUTURE) ×6 IMPLANT
SUT PROLENE 7 0 BV1 MDA (SUTURE) ×2 IMPLANT
SUT STEEL 6MS V (SUTURE) ×4 IMPLANT
SUT STEEL STERNAL CCS#1 18IN (SUTURE) IMPLANT
SYSTEM SAHARA CHEST DRAIN ATS (WOUND CARE) ×2 IMPLANT
TAPE CLOTH SURG 4X10 WHT LF (GAUZE/BANDAGES/DRESSINGS) IMPLANT
TAPE PAPER 3X10 WHT MICROPORE (GAUZE/BANDAGES/DRESSINGS) IMPLANT
TOWEL GREEN STERILE (TOWEL DISPOSABLE) ×2 IMPLANT
TOWEL GREEN STERILE FF (TOWEL DISPOSABLE) ×2 IMPLANT
TRAY FOLEY SLVR 16FR TEMP STAT (SET/KITS/TRAYS/PACK) ×2 IMPLANT
TUBING LAP HI FLOW INSUFFLATIO (TUBING) ×2 IMPLANT
UNDERPAD 30X36 HEAVY ABSORB (UNDERPADS AND DIAPERS) ×2 IMPLANT
WATER STERILE IRR 1000ML POUR (IV SOLUTION) ×4 IMPLANT

## 2024-03-04 NOTE — Brief Op Note (Signed)
 03/04/2024  1:40 PM  PATIENT:  Dave Williams  68 y.o. male  PRE-OPERATIVE DIAGNOSIS:  Coronary Artery Disease  POST-OPERATIVE DIAGNOSIS:  Coronary Artery Disease  PROCEDURE:  Procedure(s): CORONARY ARTERY BYPASS GRAFTING (CABG) TIMES THREE UTILIZING LEFT INTERNAL MAMMARY ARTERY AND ENDOSCOPIC VEIN HARVEST RIGHT GREATER SAPHENOUS VEIN (N/A) ECHOCARDIOGRAM, TRANSESOPHAGEAL (N/A) Vein harvest time: Vein prep time:  SURGEON:  Surgeons and Role:    * Lightfoot, Eliezer Lofts, MD - Primary  PHYSICIAN ASSISTANT: Jarrah Babich PA-C  ASSISTANTS: KRISTINA PRICE RNFA   ANESTHESIA:   general  EBL:  329 mL   BLOOD ADMINISTERED:none  DRAINS:  3 CHEST TUBES IN MEDIASTINUM AND LEFT PLEURAL SPACE    LOCAL MEDICATIONS USED:  NONE  SPECIMEN:  No Specimen  DISPOSITION OF SPECIMEN:  N/A  COUNTS:  YES  TOURNIQUET:  * No tourniquets in log *  DICTATION: .Dragon Dictation  PLAN OF CARE: Admit to inpatient   PATIENT DISPOSITION:  ICU - intubated and hemodynamically stable.   Delay start of Pharmacological VTE agent (>24hrs) due to surgical blood loss or risk of bleeding: yes  COMPLICATIONS: NO KNOWN

## 2024-03-04 NOTE — Procedures (Signed)
 Extubation Procedure Note  Patient Details:   Name: Dave Williams DOB: Jun 18, 1956 MRN: [id number]   Airway Documentation:    Vent end date: 03/04/24 Vent end time: 1816   Evaluation  O2 sats: stable throughout Complications: No apparent complications Patient did tolerate procedure well. Bilateral Breath Sounds: Clear, Diminished   Yes Patient performed a VC 1.16 L, NIF -20; positive cuff leak.  Patient extubated to 4 L [place].  Performed VC 500 mL  Epifanio Lesches A 03/04/2024, 6:58 PM

## 2024-03-04 NOTE — Anesthesia Procedure Notes (Signed)
 Arterial Line Insertion Performed by: Alease Medina, CRNA, CRNA  Patient location: Pre-op. Preanesthetic checklist: patient identified, IV checked, site marked, risks and benefits discussed, surgical consent, monitors and equipment checked, pre-op evaluation, timeout performed and anesthesia consent Lidocaine 1% used for infiltration Left, radial was placed Catheter size: 20 G Hand hygiene performed  and maximum sterile barriers used   Attempts: 1 Procedure performed without using ultrasound guided technique. Following insertion, dressing applied and Biopatch. Post procedure assessment: normal and unchanged  Patient tolerated the procedure well with no immediate complications.

## 2024-03-04 NOTE — Progress Notes (Signed)
 Rapid wean protocol intiated.

## 2024-03-04 NOTE — Op Note (Signed)
 [address]       [place] [address]             [phone]                                          03/04/2024 Patient:  Dave Williams Pre-Op Dx: 3V CAD   Post-op Dx:  same Procedure: CABG X 3.  LIMA LAD, RSVG PDA, OM   Endoscopic greater saphenous vein harvest on the right   Surgeon and Role:      * Dave Williams, Dave Lofts, MD - Primary    Dave Williams , PA-C - assisting An experienced assistant was required given the complexity of this surgery and the standard of surgical care. The assistant was needed for exposure, dissection, suctioning, retraction of delicate tissues and sutures, instrument exchange and for overall help during this procedure.    Anesthesia  general EBL:  Blood Administration: none Xclamp Time:  50 min Pump Time:   Drains: 58 F blake drain: R, L, mediastinal  Wires: V Counts: correct   Indications: 68 year old male with three-vessel coronary artery disease.  Left heart cath shows good targets in the PDA, LAD, OM1, and possibly OM 2 or 3.  His echocardiogram shows preserved biventricular function and no significant valvular disease.  We discussed the risk benefits of surgical revascularization.  He is agreeable to proceed. Findings: Good LIMA, and vein.  Good LAD, PDA, small OM  Operative Technique: All invasive lines were placed in pre-op holding.  After the risks, benefits and alternatives were thoroughly discussed, the patient was brought to the operative theatre.  Anesthesia was induced, and the patient was prepped and draped in normal sterile fashion.  An appropriate surgical pause was performed, and pre-operative antibiotics were dosed accordingly.  We began with simultaneous incisions along the right leg for harvesting of the greater saphenous vein and the chest for the sternotomy.  In regards to the sternotomy, this was carried down with bovie cautery, and the sternum was divided with a reciprocating saw.   Meticulous hemostasis was obtained.  The left internal thoracic artery was exposed and harvested in in pedicled fashion.  The patient was systemically heparinized, and the artery was divided distally, and placed in a papaverine sponge.    The sternal elevator was removed, and a retractor was placed.  The pericardium was divided in the midline and fashioned into a cradle with pericardial stitches.   After we confirmed an appropriate ACT, the ascending aorta was cannulated in standard fashion.  The right atrial appendage was used for venous cannulation site.  Cardiopulmonary bypass was initiated, and the heart retractor was placed. The cross clamp was applied, and a dose of anterograde cardioplegia was given with good arrest of the heart.  We moved to the posterior wall of the heart, and found a good target on the PDA.  An arteriotomy was made, and the vein graft was anastomosed to it in an end to side fashion.  Next we exposed the lateral wall, and found a good target on the OM.  An end to side anastomosis with the vein graft was then created.  Finally, we exposed a good target on the LAD, and fashioned an end to side anastomosis between it and the LITA.  We began to re-warm, and a re-animation dose of cardioplegia was given.  The heart was de-aired, and the cross clamp was removed.  Meticulous hemostasis was obtained.    A partial occludding clamp was then placed on the ascending aorta, and we created an end to side anastomosis between it and the proximal vein grafts.  Rings were placed on the proximal anastomosis.  Hemostasis was obtained, and we separated from cardiopulmonary bypass without event.  The heparin was reversed with protamine.  Chest tubes and wires were placed, and the sternum was re-approximated with sternal wires.  The soft tissue and skin were re-approximated wth absorbable suture.    The patient tolerated the procedure without any immediate complications, and was transferred to the ICU in  guarded condition.  [name]

## 2024-03-04 NOTE — Progress Notes (Signed)
      [address]       [name] [address]             [phone]      S/p CABG x 3  Intubated, waking up  BP (!) 121/95   Pulse 80   Temp 98.2 F (36.8 C) (Oral)   Resp 17   Ht 6\' 3"  (1.905 m)   Wt 101.6 kg   SpO2 96%   BMI 28.00 kg/m  CI 2.6   Intake/Output Summary (Last 24 hours) at 03/04/2024 1754 Last data filed at 03/04/2024 1700 Gross per 24 hour  Intake 3901.01 ml  Output 3038 ml  Net 863.01 ml  CT ~ 300 ml since OR  K 4.2 Hct 35 7.31/40/110/-6 Ok to extubate  Viviann Spare C. Dorris Fetch, MD [facility] [phone]

## 2024-03-04 NOTE — Anesthesia Procedure Notes (Addendum)
 Central Venous Catheter Insertion Performed by: Beryle Lathe, MD, anesthesiologist Start/End3/24/2025 7:15 AM, 03/04/2024 7:25 AM Preanesthetic checklist: patient identified, IV checked, risks and benefits discussed, surgical consent, monitors and equipment checked, pre-op evaluation, timeout performed and anesthesia consent Position: Trendelenburg Lidocaine 1% used for infiltration and patient sedated Hand hygiene performed , maximum sterile barriers used  and Seldinger technique used Catheter size: 8.5 Fr Central line was placed.Sheath introducer Procedure performed using ultrasound guided technique. Ultrasound Notes:anatomy identified, needle tip was noted to be adjacent to the nerve/plexus identified, no ultrasound evidence of intravascular and/or intraneural injection and image(s) printed for medical record Attempts: 1 Following insertion, line sutured, dressing applied and Biopatch. Post procedure assessment: blood return through all ports, free fluid flow and no air  Patient tolerated the procedure well with no immediate complications. Additional procedure comments: Triple port SLIC placed via Swan port.

## 2024-03-04 NOTE — H&P (Signed)
 [address]       [place] [address]             [phone]                                        Kashis Pen[facility] Medical Record #[id number] Date of Birth: Aug 04, 1956   Referring: [place], Peter M, MD Primary Care: Oneal Grout, FNP Primary Cardiologist:None   Chief Complaint:        Chief Complaint  Patient presents with   Coronary Artery Disease      New patient consultation, CATH 12/17, ECHO       History of Present Illness:     Dave Williams 68 y.o. male presents for surgical evaluation of three-vessel coronary artery disease.  He works as an Museum/gallery exhibitions officer, and has had 2 emergency room visits for worsening chest pain and shortness of breath.  He subsequently underwent a left heart cath which identified the disease.  Currently experiencing some anginal symptoms with exertion.     Past Medical and Surgical History: Previous Chest Surgery: No Previous Chest Radiation: No Diabetes Mellitus: No.  HbA1C pending Creatinine:  Recent Labs       Lab Results  Component Value Date    CREATININE 1.31 (H) 11/27/2023    CREATININE 1.10 11/03/2023    CREATININE 1.17 11/07/2021              Past Medical History:  Diagnosis Date   Chronic left hip pain     Hypertension                 Past Surgical History:  Procedure Laterality Date   BACK SURGERY       KNEE ARTHROSCOPY       LEFT HEART CATH AND CORONARY ANGIOGRAPHY N/A 11/28/2023    Procedure: LEFT HEART CATH AND CORONARY ANGIOGRAPHY;  Surgeon: Swazil[place], Peter M, MD;  Location: [facility] INVASIVE CV LAB;  Service: Cardiovascular;  Laterality: N/A;   PENILE CYST REMOVAL              Social History:   Tobacco Use History  Social History        Tobacco Use  Smoking Status Every Day   Current packs/day: 1.00   Types: Cigarettes  Smokeless Tobacco Never      Social History       Substance and Sexual Activity  Alcohol Use No        Allergies  No Known Allergies              Current Outpatient Medications  Medication Sig Dispense Refill   amLODipine (NORVASC) 5 MG tablet Take 1 tablet (5 mg total) by mouth daily. 30 tablet 0   aspirin EC 81 MG tablet Take 1 tablet (81 mg total) by mouth daily. Swallow whole.       atorvastatin (LIPITOR) 20 MG tablet Take 20 mg by mouth daily.       HYDROcodone-acetaminophen (NORCO/VICODIN) 5-325 MG tablet Take 1 tablet by mouth every 6 (six) hours as needed for moderate pain (pain score 4-6). 56 tablet 0   isosorbide mononitrate (IMDUR) 30 MG 24 hr tablet Take 1 tablet (30 mg total) by mouth daily. 30 tablet 11   metoprolol succinate (TOPROL XL) 25 MG 24 hr tablet Take 1 tablet (25 mg total) by mouth daily. 30 tablet 11  nitroGLYCERIN (NITROSTAT) 0.3 MG SL tablet Place 1 tablet (0.3 mg total) under the tongue every 5 (five) minutes as needed for chest pain. 90 tablet 2      No current facility-administered medications for this visit.         (Not in a hospital admission)              Family History  Problem Relation Age of Onset   Cancer Mother     Hypertension Mother     Heart failure Mother     Cancer Father     Hypertension Father     Heart failure Father              Review of Systems:    Review of Systems  Constitutional:  Positive for malaise/fatigue.  Respiratory:  Positive for shortness of breath.   Cardiovascular:  Positive for chest pain.  Neurological: Negative.                    Physical Exam: BP 126/72 (BP Location: Left Arm, Patient Position: Sitting, Cuff Size: Normal)   Pulse 71   Resp 20   Ht 6\' 3"  (1.905 m)   Wt 234 lb (106.1 kg)   SpO2 94% Comment: RA  BMI 29.25 kg/m  Physical Exam Constitutional:      General: He is not in acute distress.    Appearance: He is normal weight. He is not ill-appearing.  HENT:     Head: Normocephalic and atraumatic.  Eyes:     Extraocular Movements: Extraocular movements intact.  Cardiovascular:     Rate and Rhythm: Normal rate.   Pulmonary:     Effort: Pulmonary effort is normal. No respiratory distress.  Abdominal:     General: Abdomen is flat. There is no distension.  Musculoskeletal:        General: Normal range of motion.     Cervical back: Normal range of motion.  Skin:    General: Skin is warm and dry.     Comments: Significant left lower extremity varicosity.  No significant varicose veins right  Neurological:     General: No focal deficit present.     Mental Status: He is alert and oriented to person, place, and time.          Diagnostic Studies & Laboratory data:    Left Heart Catherization:  Intervention   Echo: IMPRESSIONS     1. Left ventricular ejection fraction, by estimation, is 65 to 70%. The  left ventricle has normal function. The left ventricle has no regional  wall motion abnormalities. Left ventricular diastolic parameters are  consistent with Grade I diastolic  dysfunction (impaired relaxation).   2. Right ventricular systolic function is normal. The right ventricular  size is normal.   3. The mitral valve is normal in structure. Trivial mitral valve  regurgitation. No evidence of mitral stenosis.   4. The aortic valve is tricuspid. There is mild calcification of the  aortic valve. Aortic valve regurgitation is not visualized. Aortic valve  sclerosis/calcification is present, without any evidence of aortic  stenosis.   5. The inferior vena cava is normal in size with greater than 50%  respiratory variability, suggesting right atrial pressure of 3 mmHg.     EKG: sinus I have independently reviewed the above radiologic studies and discussed with the patient    Recent Lab Findings: Recent Labs       Lab Results  Component Value Date  WBC 9.6 11/27/2023    HGB 15.4 11/27/2023    HCT 48.2 11/27/2023    PLT 225 11/27/2023    GLUCOSE 96 11/27/2023    ALT 21 11/03/2023    AST 21 11/03/2023    NA 140 11/27/2023    K 5.0 11/27/2023    CL 102 11/27/2023     CREATININE 1.31 (H) 11/27/2023    BUN 21 11/27/2023    CO2 22 11/27/2023            Assessment / Plan:   68 year old male with three-vessel coronary artery disease.  Left heart cath shows good targets in the PDA, LAD, OM1, and possibly OM 2 or 3.  His echocardiogram shows preserved biventricular function and no significant valvular disease.  We discussed the risk benefits of surgical revascularization.  He is agreeable to proceed,  CABG 3-4.  LAD, PDA, OM1 and 2/3

## 2024-03-04 NOTE — Progress Notes (Signed)
 Patient's friend, Lanice Shirts, came to pick up patient's keys and wallet.  Patient's duffle bag and patients belongings bag taken to [facility] by Lynetta Mare, RN.

## 2024-03-04 NOTE — Anesthesia Postprocedure Evaluation (Signed)
 Anesthesia Post Note  Patient: Dave Williams  Procedure(s) Performed: CORONARY ARTERY BYPASS GRAFTING (CABG) TIMES THREE UTILIZING LEFT INTERNAL MAMMARY ARTERY AND ENDOSCOPIC VEIN HARVEST RIGHT GREATER SAPHENOUS VEIN (Chest) ECHOCARDIOGRAM, TRANSESOPHAGEAL     Patient location during evaluation: ICU Anesthesia Type: General Level of consciousness: sedated and patient remains intubated per anesthesia plan Pain management: pain level controlled Vital Signs Assessment: post-procedure vital signs reviewed and stable Respiratory status: patient remains intubated per anesthesia plan Cardiovascular status: stable Postop Assessment: no apparent nausea or vomiting Anesthetic complications: yes  Encounter Notable Events  Notable Event Outcome Phase Comment  Difficult to intubate - expected  Intraprocedure Filed from anesthesia note documentation.    Last Vitals:  Vitals:   03/04/24 1345 03/04/24 1400  BP:  119/84  Pulse: 68 74  Resp: 17 (!) 23  Temp:    SpO2: 100% 100%    Last Pain:  Vitals:   03/04/24 0550  TempSrc: Oral                 Dave Williams

## 2024-03-04 NOTE — Consult Note (Signed)
 NAME:  Dave Williams, MRN:  [id number], DOB:  1956-04-08, LOS: 0 ADMISSION DATE:  03/04/2024, CONSULTATION DATE:  3/24 REFERRING MD:  Cliffton Asters, CHIEF COMPLAINT:  s/p CABG   History of Present Illness:  68 year old male with past medical history of stroke, hypertension, CAD who presents to [facility] today for CABG.  Seen by Dr. Cliffton Asters after having 2 ER visits for worsening chest pain and shortness of breath.  He subsequently had an left heart cath on 11/28/2023 showing three-vessel obstructive CAD.  Additionally, he had an echocardiogram at that time showing LVEF 65 to 70%, no regional wall motion abnormalities and G1DD.  Presented 3/24 for CABG.  PCCM consulted for postoperative management and CVICU.  Pump time: 1h 24m Xclamp time: 74m EBL: 329cc UOP: 1180cc Cell saver: 230cc   Pertinent  Medical History  Stroke, hypertension, CAD  Significant Hospital Events: Including procedures, antibiotic start and stop dates in addition to other pertinent events   3/24 s/p CABG x 3 LIMA to LAD, RSVG to PDA, OM  Interim History / Subjective:  Patient unable to participate in the subjective portion of the exam.  Postoperative intubated.  Objective   Blood pressure 92/60, pulse 73, temperature 98.2 F (36.8 C), temperature source Oral, resp. rate 14, height 6\' 3"  (1.905 m), weight 101.6 kg, SpO2 96%.        Intake/Output Summary (Last 24 hours) at 03/04/2024 0845 Last data filed at 03/04/2024 0841 Gross per 24 hour  Intake 1200 ml  Output 300 ml  Net 900 ml   Filed Weights   03/04/24 0550  Weight: 101.6 kg    Examination: General: middle aged male, laying flat, sedated, vented  HENT: ncat, anicteric sclera, ett  Lungs: clear bilaterally, vented 50/5 Cardiovascular: s1s2, bradycardia, no murmur, rub, gallop, sternotomy dressing CDI, midline chest tube with sanguinous drainage Abdomen: flat, soft, non-distended Extremities: no pitting edema, post operative wrap to right leg  Neuro:  sedated GU: foley  Resolved Hospital Problem list    Assessment & Plan:  Multivessel coronary artery disease s/p CABG x3 LIMA to LAD, RSVG to PDA, OM CAD with previous LHC x3 occlusive disease. Now s/p CABG x3. Intraoperative Echo with EF 55-60% - post op CXR, ABG, CBC, BMP now  - post-op management per TCTS  - rapid wean per TCTS protocol  - insulin gtt per endotool for tight glucose control  - wean neosynephrine for MAP 70-90, albumin PRN  - con't tele, pace prn  - mediastinal drains per TCTS  - multimodal pain control per protocol- oxycodone, tramadol, morphine with bowel regimen - complete perioperative antibiotic course  - metoprolol once off neo - asa, statin tomorrow   Post bypass vasoplegia - wean neo for MAP 70-90  - albumin prn   Post operative vent management  Tobacco use  - full mechanical vent support - rapid wean per protocol  - lung protective ventilation 6-8cc/kg Vt - VAP and PAD bundle in place  - PPI  - titrate FiO2 to sat goal >92  - maintain peak/plats <30, driving pressures <62   - may need nicotine patch while admitted   Expected post-operative ABLA Expected post-operative consumptive thrombocytopenia  - f/u post op labs  - transfuse prn or active bleeding   Hypertension  - hold home amlodipine, imdur  - hold scheduled metop until off neo   History of stroke  Reportedly no residual  - monitor clinically  - neuro checks  Best Practice (right click and "Reselect all SmartList Selections"  daily)   Diet/type: NPO DVT prophylaxis: SCD Pressure ulcer(s): none  GI prophylaxis: H2B Lines: Central line, Arterial Line, and yes and it is still needed Foley:  Yes, and it is still needed Code Status:  full code Last date of multidisciplinary goals of care discussion [pending]  Labs   CBC: Recent Labs  Lab 02/29/24 1053 03/04/24 0822  WBC 9.0  --   HGB 16.4 14.3  HCT 49.1 42.0  MCV 94.6  --   PLT 247  --     Basic Metabolic  Panel: Recent Labs  Lab 02/29/24 1053 03/04/24 0822  NA 136 136  K 5.1 4.8  CL 102 104  CO2 27  --   GLUCOSE 117* 126*  BUN 16 16  CREATININE 1.31* 1.10  CALCIUM 9.6  --    GFR: Estimated Creatinine Clearance: 85.3 mL/min (by C-G formula based on SCr of 1.1 mg/dL). Recent Labs  Lab 02/29/24 1053  WBC 9.0    Liver Function Tests: Recent Labs  Lab 02/29/24 1053  AST 36  ALT 37  ALKPHOS 40  BILITOT 0.9  PROT 7.4  ALBUMIN 4.3   No results for input(s): "LIPASE", "AMYLASE" in the last 168 hours. No results for input(s): "AMMONIA" in the last 168 hours.  ABG    Component Value Date/Time   TCO2 25 03/04/2024 0822     Coagulation Profile: Recent Labs  Lab 02/29/24 1053  INR 1.0    Cardiac Enzymes: No results for input(s): "CKTOTAL", "CKMB", "CKMBINDEX", "TROPONINI" in the last 168 hours.  HbA1C: Hgb A1c MFr Bld  Date/Time Value Ref Range Status  02/29/2024 10:53 AM 5.8 (H) 4.8 - 5.6 % Final    Comment:    (NOTE) Pre diabetes:          5.7%-6.4%  Diabetes:              >6.4%  Glycemic control for   <7.0% adults with diabetes     CBG: No results for input(s): "GLUCAP" in the last 168 hours.  Review of Systems:   As above  Past Medical History:  He,  has a past medical history of Chronic left hip pain, Coronary artery disease, Hypertension, Pneumonia, Stroke (HCC) (2022), and Substance abuse (HCC).   Surgical History:   Past Surgical History:  Procedure Laterality Date   BACK SURGERY     Radiocartography T2-T12   KNEE ARTHROSCOPY Bilateral    LEFT HEART CATH AND CORONARY ANGIOGRAPHY N/A 11/28/2023   Procedure: LEFT HEART CATH AND CORONARY ANGIOGRAPHY;  Surgeon: [place], Peter M, MD;  Location: [facility] INVASIVE CV LAB;  Service: Cardiovascular;  Laterality: N/A;   PILONIDAL CYST EXCISION     In patients 55s     Social History:   reports that he has been smoking cigarettes. He has never used smokeless tobacco. He reports that he does not  currently use drugs after having used the following drugs: "Crack" cocaine and Marijuana. He reports that he does not drink alcohol.   Family History:  His family history includes Cancer in his father and mother; Heart failure in his father and mother; Hypertension in his father and mother.   Allergies No Known Allergies   Home Medications  Prior to Admission medications   Medication Sig Start Date End Date Taking? Authorizing Provider  amLODipine (NORVASC) 5 MG tablet Take 1 tablet (5 mg total) by mouth daily. 11/07/21  Yes Benjiman Core, MD  aspirin 81 MG chewable tablet Chew 81 mg by mouth daily.  Yes [provider]  atorvastatin (LIPITOR) 20 MG tablet Take 20 mg by mouth daily.   Yes [provider]  HYDROcodone-acetaminophen (NORCO) 7.5-325 MG tablet Take 1 tablet by mouth every 6 (six) hours as needed for moderate pain (pain score 4-6).   Yes [provider]  isosorbide mononitrate (IMDUR) 30 MG 24 hr tablet Take 1 tablet (30 mg total) by mouth daily. 11/28/23 11/27/24 Yes [place], Peter M, MD  metoprolol succinate (TOPROL XL) 25 MG 24 hr tablet Take 1 tablet (25 mg total) by mouth daily. 11/28/23 11/27/24 Yes [place], Peter M, MD  nitroGLYCERIN (NITROSTAT) 0.4 MG SL tablet DISSOLVE 1 TABLET UNDER THE TONGUE EVERY 5 MINUTES AS NEEDED FOR CHEST PAIN. DO NOT EXCEED A TOTAL OF 3 DOSES IN 15 MINUTES. 01/26/24  Yes Lightfoot, Eliezer Lofts, MD  Vitamin D, Ergocalciferol, (DRISDOL) 1.25 MG (50000 UNIT) CAPS capsule Take 50,000 Units by mouth once a week. 01/10/24  Yes [provider]  aspirin EC 81 MG tablet Take 1 tablet (81 mg total) by mouth daily. Swallow whole. Patient not taking: Reported on 02/23/2024 11/27/23   Runell Gess, MD     Critical care time: 61    Lenard Galloway [facility] 03/04/24 1:07 PM  Please see [email or web address] for pager details.  From 7A-7P if no response, please call [phone] After hours,  please call ELink [phone]

## 2024-03-04 NOTE — Progress Notes (Signed)
 Anesthesia made aware patient took Nitroglycerin at 0515 this am. No new orders at this time.

## 2024-03-04 NOTE — Anesthesia Procedure Notes (Signed)
 Procedure Name: Intubation Date/Time: 03/04/2024 8:18 AM  Performed by: Alease Medina, CRNAPre-anesthesia Checklist: Patient identified, Emergency Drugs available, Suction available and Patient being monitored Patient Re-evaluated:Patient Re-evaluated prior to induction Oxygen Delivery Method: Circle system utilized Preoxygenation: Pre-oxygenation with 100% oxygen Induction Type: IV induction Ventilation: Mask ventilation without difficulty Laryngoscope Size: Glidescope and 4 Grade View: Grade I Tube type: Oral Tube size: 8.0 mm Number of attempts: 1 Airway Equipment and Method: Stylet and Oral airway Placement Confirmation: ETT inserted through vocal cords under direct vision, positive ETCO2 and breath sounds checked- equal and bilateral Secured at: 23 cm Tube secured with: Tape Dental Injury: Teeth and Oropharynx as per pre-operative assessment  Difficulty Due To: Difficulty was anticipated, Difficult Airway- due to anterior larynx and Difficult Airway- due to limited oral opening

## 2024-03-04 NOTE — Transfer of Care (Signed)
 Immediate Anesthesia Transfer of Care Note  Patient: Dave Williams  Procedure(s) Performed: CORONARY ARTERY BYPASS GRAFTING (CABG) TIMES THREE UTILIZING LEFT INTERNAL MAMMARY ARTERY AND ENDOSCOPIC VEIN HARVEST RIGHT GREATER SAPHENOUS VEIN (Chest) ECHOCARDIOGRAM, TRANSESOPHAGEAL  Patient Location: ICU  Anesthesia Type:General  Level of Consciousness: sedated and Patient remains intubated per anesthesia plan  Airway & Oxygen Therapy: Patient remains intubated per anesthesia plan and Patient placed on Ventilator (see vital sign flow sheet for setting)  Post-op Assessment: Report given to RN and Post -op Vital signs reviewed and stable  Post vital signs: Reviewed and stable  Last Vitals:  Vitals Value Taken Time  BP 89/60 03/04/24 1245  Temp    Pulse 54 03/04/24 1248  Resp 14 03/04/24 1248  SpO2 97 % 03/04/24 1248  Vitals shown include unfiled device data.  Last Pain:  Vitals:   03/04/24 0550  TempSrc: Oral         Complications:  Encounter Notable Events  Notable Event Outcome Phase Comment  Difficult to intubate - expected  Intraprocedure Filed from anesthesia note documentation.    Patient transported to ICU with standard monitors (HR, BP, SPO2, RR) and emergency drugs/equipment. Controlled ventilation maintained via ambu bag. Report given to bedside RN and respiratory therapist. Pt connected to ICU monitor and ventilator. All questions answered and vital signs stable before leaving

## 2024-03-04 NOTE — Progress Notes (Signed)
 Ventilator settings changed per Rapid Wean Protocol.

## 2024-03-04 NOTE — Hospital Course (Addendum)
  History of Present Illness:     Dave Williams 68 y.o. male presents for surgical evaluation of three-vessel coronary artery disease.  He works as an Museum/gallery exhibitions officer, and has had 2 emergency room visits for worsening chest pain and shortness of breath.  He subsequently underwent a left heart cath which identified the disease.  Currently experiencing some anginal symptoms with exertion.  Dr. Cliffton Asters evaluated the patient and all relevant studies and recommended proceeding with CABG as his best  revascularization option.  The risks and benefits of the procedure were discussed with the patient who agreed to proceed and he was admitted this hospitalization electively for the procedure.  Hospital course: The patient was taken the operating room on 03/04/2024 at which time he underwent CABG x 3.  He tolerated the procedure well and was taken to the surgical intensive care unit in stable condition.  The patient was extubated the evening of surgery.  He required Neo-synephrine post operatively which was weaned as hemodynamics allowed.  His arterial lines, chest tubes, and pacing wires were removed without difficulty.  On the evening of postop day 1 he did develop urinary retention requiring replacement of Foley catheter.  Flomax was started.  He is also started on a course of diuretics for expected volume overload not clinically significant.  He does have an expected acute blood loss anemia and hemoglobin and hematocrit were stabilized.  He did not require transfusion.  He did develop some early infiltrates on chest x-ray and was started initially on IV antibiotics but this was discontinued on postop day 2.  His white blood cell count is being monitored as he does have a leukocytosis.  It is trending down over time.  He is not having any fevers.  He is not having any significant cough or sputum production but has been started on DuoNebs with aggressive pulmonary hygiene and oxygen is being weaned.  On postop day #2 he remained  on 5 L nasal cannula.  On postop day #2 he was felt to be stable for transfer to the 4 E telemetry unit.  The patient developed SVT with rates in the 150s.  He was treated with IV Lopressor without decrease in heart rate.  He was ultimately started on Amiodarone drip protocol and titration of oral lopressor regimen.  His heart rate improved down into the 70s.  He was transitioned off the IV Amiodarone and started on an oral regimen of 200 mg BID.  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  He was resumed on Amiodarone drip with successful conversion to NSR.  He continued to have issues with orthostatic changes which limited ability to titrate Lopressor. Midodrine has been added.   He was transitioned back to oral Amiodarone at 400 mg BID.  Cardiology consultation was obtained to help with rhythm management.  They recommended initiation of DOAC therapy for stroke prevention.   His foley catheter was removed for voiding trial.  He was able to void.  He was constipated and treated with lactulose and enema with good result.  He was evaluated by PT/OT who recommended CIR.  Insurance authorization was done and he was transferred to CIR on 03/13/2024.Marland Kitchen

## 2024-03-05 ENCOUNTER — Inpatient Hospital Stay (HOSPITAL_COMMUNITY)

## 2024-03-05 ENCOUNTER — Encounter (HOSPITAL_COMMUNITY): Payer: Self-pay | Admitting: Thoracic Surgery (Cardiothoracic Vascular Surgery)

## 2024-03-05 ENCOUNTER — Other Ambulatory Visit: Payer: Self-pay

## 2024-03-05 DIAGNOSIS — I959 Hypotension, unspecified: Secondary | ICD-10-CM

## 2024-03-05 DIAGNOSIS — Z720 Tobacco use: Secondary | ICD-10-CM

## 2024-03-05 LAB — BASIC METABOLIC PANEL
Anion gap: 6 (ref 5–15)
Anion gap: 6 (ref 5–15)
BUN: 9 mg/dL (ref 8–23)
BUN: 9 mg/dL (ref 8–23)
CO2: 24 mmol/L (ref 22–32)
CO2: 26 mmol/L (ref 22–32)
Calcium: 7.9 mg/dL — ABNORMAL LOW (ref 8.9–10.3)
Calcium: 8.2 mg/dL — ABNORMAL LOW (ref 8.9–10.3)
Chloride: 107 mmol/L (ref 98–111)
Chloride: 102 mmol/L (ref 98–111)
Creatinine, Ser: 0.89 mg/dL (ref 0.61–1.24)
Creatinine, Ser: 0.98 mg/dL (ref 0.61–1.24)
GFR, Estimated: >60 mL/min (ref >60)
GFR, Estimated: >60 mL/min (ref >60)
Glucose, Bld: 115 mg/dL — ABNORMAL HIGH (ref 70–99)
Glucose, Bld: 123 mg/dL — ABNORMAL HIGH (ref 70–99)
Potassium: 4.4 mmol/L (ref 3.5–5.1)
Potassium: 4 mmol/L (ref 3.5–5.1)
Sodium: 137 mmol/L (ref 135–145)
Sodium: 134 mmol/L — ABNORMAL LOW (ref 135–145)

## 2024-03-05 LAB — CBC
HCT: 33.4 % — ABNORMAL LOW (ref 39.0–52.0)
HCT: 29.6 % — ABNORMAL LOW (ref 39.0–52.0)
Hemoglobin: 11.2 g/dL — ABNORMAL LOW (ref 13.0–17.0)
Hemoglobin: 9.9 g/dL — ABNORMAL LOW (ref 13.0–17.0)
MCH: 31.5 pg (ref 26.0–34.0)
MCH: 31.6 pg (ref 26.0–34.0)
MCHC: 33.5 g/dL (ref 30.0–36.0)
MCHC: 33.4 g/dL (ref 30.0–36.0)
MCV: 94.1 fL (ref 80.0–100.0)
MCV: 94.6 fL (ref 80.0–100.0)
Platelets: 206 10*3/uL (ref 150–400)
Platelets: 148 10*3/uL — ABNORMAL LOW (ref 150–400)
RBC: 3.55 MIL/uL — ABNORMAL LOW (ref 4.22–5.81)
RBC: 3.13 MIL/uL — ABNORMAL LOW (ref 4.22–5.81)
RDW: 13.1 % (ref 11.5–15.5)
RDW: 13.3 % (ref 11.5–15.5)
WBC: 16 10*3/uL — ABNORMAL HIGH (ref 4.0–10.5)
WBC: 12.8 10*3/uL — ABNORMAL HIGH (ref 4.0–10.5)
nRBC: 0 % (ref 0.0–0.2)
nRBC: 0 % (ref 0.0–0.2)

## 2024-03-05 LAB — GLUCOSE, CAPILLARY
Glucose-Capillary: 113 mg/dL — ABNORMAL HIGH (ref 70–99)
Glucose-Capillary: 115 mg/dL — ABNORMAL HIGH (ref 70–99)
Glucose-Capillary: 115 mg/dL — ABNORMAL HIGH (ref 70–99)
Glucose-Capillary: 118 mg/dL — ABNORMAL HIGH (ref 70–99)
Glucose-Capillary: 120 mg/dL — ABNORMAL HIGH (ref 70–99)
Glucose-Capillary: 123 mg/dL — ABNORMAL HIGH (ref 70–99)
Glucose-Capillary: 126 mg/dL — ABNORMAL HIGH (ref 70–99)
Glucose-Capillary: 134 mg/dL — ABNORMAL HIGH (ref 70–99)
Glucose-Capillary: 140 mg/dL — ABNORMAL HIGH (ref 70–99)
Glucose-Capillary: 96 mg/dL (ref 70–99)

## 2024-03-05 LAB — MAGNESIUM
Magnesium: 2.3 mg/dL (ref 1.7–2.4)
Magnesium: 2.3 mg/dL (ref 1.7–2.4)

## 2024-03-05 MED ORDER — ATORVASTATIN CALCIUM 80 MG PO TABS
80.0000 mg | ORAL_TABLET | Freq: Every day | ORAL | Status: DC
  Administered 2024-03-06 – 2024-03-13 (×8): 80 mg via ORAL
  Filled 2024-03-05 (×7): qty 1

## 2024-03-05 MED ORDER — NICOTINE 14 MG/24HR TD PT24
14.0000 mg | MEDICATED_PATCH | Freq: Every day | TRANSDERMAL | Status: DC
  Administered 2024-03-05 – 2024-03-13 (×9): 14 mg via TRANSDERMAL
  Filled 2024-03-05 (×9): qty 1

## 2024-03-05 MED ORDER — PIPERACILLIN-TAZOBACTAM 3.375 G IVPB
3.3750 g | Freq: Three times a day (TID) | INTRAVENOUS | Status: DC
  Administered 2024-03-05 – 2024-03-06 (×4): 3.375 g via INTRAVENOUS
  Filled 2024-03-05 (×4): qty 50

## 2024-03-05 MED ORDER — ENOXAPARIN SODIUM 40 MG/0.4ML IJ SOSY
40.0000 mg | PREFILLED_SYRINGE | Freq: Every day | INTRAMUSCULAR | Status: DC
  Administered 2024-03-05 – 2024-03-09 (×5): 40 mg via SUBCUTANEOUS
  Filled 2024-03-05 (×5): qty 0.4

## 2024-03-05 MED ORDER — TAMSULOSIN HCL 0.4 MG PO CAPS
0.4000 mg | ORAL_CAPSULE | Freq: Every day | ORAL | Status: DC
  Administered 2024-03-05 – 2024-03-13 (×9): 0.4 mg via ORAL
  Filled 2024-03-05 (×9): qty 1

## 2024-03-05 MED ORDER — INSULIN ASPART 100 UNIT/ML IJ SOLN
0.0000 [IU] | INTRAMUSCULAR | Status: DC
  Administered 2024-03-05 – 2024-03-06 (×5): 2 [IU] via SUBCUTANEOUS
  Administered 2024-03-06: 4 [IU] via SUBCUTANEOUS

## 2024-03-05 MED FILL — Calcium Chloride Inj 10%: INTRAVENOUS | Qty: 10 | Status: AC

## 2024-03-05 MED FILL — Lidocaine HCl Local Preservative Free (PF) Inj 2%: INTRAMUSCULAR | Qty: 14 | Status: AC

## 2024-03-05 MED FILL — Mannitol IV Soln 20%: INTRAVENOUS | Qty: 500 | Status: AC

## 2024-03-05 MED FILL — Heparin Sodium (Porcine) Inj 1000 Unit/ML: Qty: 1000 | Status: AC

## 2024-03-05 MED FILL — Heparin Sodium (Porcine) Inj 1000 Unit/ML: INTRAMUSCULAR | Qty: 20 | Status: AC

## 2024-03-05 MED FILL — Electrolyte-R (PH 7.4) Solution: INTRAVENOUS | Qty: 4000 | Status: AC

## 2024-03-05 MED FILL — Sodium Chloride IV Soln 0.9%: INTRAVENOUS | Qty: 1000 | Status: AC

## 2024-03-05 MED FILL — Potassium Chloride Inj 2 mEq/ML: INTRAVENOUS | Qty: 40 | Status: AC

## 2024-03-05 MED FILL — Sodium Bicarbonate IV Soln 8.4%: INTRAVENOUS | Qty: 50 | Status: AC

## 2024-03-05 NOTE — Discharge Instructions (Addendum)
Discharge Instructions:  1. You may shower, please wash incisions daily with soap and water and keep dry.  If you wish to cover wounds with dressing you may do so but please keep clean and change daily.  No tub baths or swimming until incisions have completely healed.  If your incisions become red or develop any drainage please call our office at [phone]  2. No Driving until cleared by Dr. Lightfoot's office and you are no longer using narcotic pain medications  3. Monitor your weight daily.. Please use the same scale and weigh at same time... If you gain 5-10 lbs in 48 hours with associated lower extremity swelling, please contact our office at [phone]  4. Fever of 101.5 for at least 24 hours with no source, please contact our office at [phone]  5. Activity- up as tolerated, please walk at least 3 times per day.  Avoid strenuous activity, no lifting, pushing, or pulling with your arms over 8-10 lbs for a minimum of 6 weeks  6. If any questions or concerns arise, please do not hesitate to contact our office at [phone]  ___________________________________________________________________________________________________________________________  Information on my medicine - ELIQUIS (apixaban)  This medication education was reviewed with me or my healthcare representative as part of my discharge preparation.  Why was Eliquis prescribed for you? Eliquis was prescribed for you to reduce the risk of a blood clot forming that can cause a stroke if you have a medical condition called atrial fibrillation (a type of irregular heartbeat).  What do You need to know about Eliquis ? Take your Eliquis TWICE DAILY - one tablet in the morning and one tablet in the evening with or without food. If you have difficulty swallowing the tablet whole please discuss with your pharmacist how to take the medication safely.  Take Eliquis exactly as prescribed by your doctor and DO NOT stop  taking Eliquis without talking to the doctor who prescribed the medication.  Stopping may increase your risk of developing a stroke.  Refill your prescription before you run out.  After discharge, you should have regular check-up appointments with your healthcare provider that is prescribing your Eliquis.  In the future your dose may need to be changed if your kidney function or weight changes by a significant amount or as you get older.  What do you do if you miss a dose? If you miss a dose, take it as soon as you remember on the same day and resume taking twice daily.  Do not take more than one dose of ELIQUIS at the same time to make up a missed dose.  Important Safety Information A possible side effect of Eliquis is bleeding. You should call your healthcare provider right away if you experience any of the following: Bleeding from an injury or your nose that does not stop. Unusual colored urine (red or dark brown) or unusual colored stools (red or black). Unusual bruising for unknown reasons. A serious fall or if you hit your head (even if there is no bleeding).  Some medicines may interact with Eliquis and might increase your risk of bleeding or clotting while on Eliquis. To help avoid this, consult your healthcare provider or pharmacist prior to using any new prescription or non-prescription medications, including herbals, vitamins, non-steroidal anti-inflammatory drugs (NSAIDs) and supplements.  This website has more information on Eliquis (apixaban): http://www.eliquis.com/eliquis/home   

## 2024-03-05 NOTE — Progress Notes (Signed)
      [address]       [place] [address]             [phone]                 1 Day Post-Op Procedure(s) (LRB): CORONARY ARTERY BYPASS GRAFTING (CABG) TIMES THREE UTILIZING LEFT INTERNAL MAMMARY ARTERY AND ENDOSCOPIC VEIN HARVEST RIGHT GREATER SAPHENOUS VEIN (N/A) ECHOCARDIOGRAM, TRANSESOPHAGEAL (N/A)   Events: No events _______________________________________________________________ Vitals: BP 123/81   Pulse 80   Temp 99.3 F (37.4 C) (Bladder)   Resp 20   Ht 6\' 3"  (1.905 m)   Wt 101.6 kg   SpO2 93%   BMI 28.00 kg/m  Filed Weights   03/04/24 0550  Weight: 101.6 kg     - Neuro: alert NAD  - Cardiovascular: sinus  Drips: neo 50.   CVP:  [2 mmHg-14 mmHg] 9 mmHg CO:  [5 L/min-8 L/min] 7 L/min CI:  [2.2 L/min/m2-3.3 L/min/m2] 3 L/min/m2  - Pulm: EWOB  ABG    Component Value Date/Time   PHART 7.324 (L) 03/04/2024 1927   PCO2ART 41.3 03/04/2024 1927   PO2ART 88 03/04/2024 1927   HCO3 21.1 03/04/2024 1927   TCO2 22 03/04/2024 1927   ACIDBASEDEF 4.0 (H) 03/04/2024 1927   O2SAT 95 03/04/2024 1927    - Abd: ND - Extremity: warm  .Intake/Output      03/24 0701 03/25 0700 03/25 0701 03/26 0700   I.V. (mL/kg) 2684.9 (26.4)    Blood 230    IV Piggyback 2957.9    Total Intake(mL/kg) 5872.7 (57.8)    Urine (mL/kg/hr) 3240 (1.3)    Blood 329    Chest Tube 604    Total Output 4173    Net +1699.7            _______________________________________________________________ Labs:    Latest Ref Rng & Units 03/05/2024    4:13 AM 03/04/2024    7:27 PM 03/04/2024    5:48 PM  CBC  WBC 4.0 - 10.5 K/uL 16.0     Hemoglobin 13.0 - 17.0 g/dL 13.0  9.9  86.5   Hematocrit 39.0 - 52.0 % 33.4  29.0  30.0   Platelets 150 - 400 K/uL 206         Latest Ref Rng & Units 03/05/2024    4:13 AM 03/04/2024    7:27 PM 03/04/2024    5:48 PM  CMP  Glucose 70 - 99 mg/dL 784     BUN 8 - 23 mg/dL 9     Creatinine 6.96 - 1.24 mg/dL 2.95     Sodium 284 -  145 mmol/L 137  139  140   Potassium 3.5 - 5.1 mmol/L 4.4  3.4  3.5   Chloride 98 - 111 mmol/L 107     CO2 22 - 32 mmol/L 24     Calcium 8.9 - 10.3 mg/dL 7.9       CXR: PV congestion  _______________________________________________________________  Assessment and Plan: POD 1 s/p CABG  Neuro: pain controlled CV: wean neo.  Will remove A line once off.  On A/S. Pulm: IS , ambulation Renal: creat stable GI: on diet Heme: stable ID: [email or web address] Endo: SSI Dispo: floor once off neo   Dave Williams O Layton Tappan 03/05/2024 7:07 AM

## 2024-03-05 NOTE — Discharge Summary (Signed)
 [address]       [place] [address]             [phone]    Physician Discharge Summary  Patient ID: Dave Williams MRN: [id number] DOB/AGE: 66-Jan-1957 66 y.o.  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 male presents for surgical evaluation of three-vessel coronary artery disease.  He works as an Museum/gallery exhibitions officer, and has had 2 emergency room visits for worsening chest pain and shortness of breath.  He subsequently underwent a left heart cath which identified the disease.  Currently experiencing some anginal symptoms with exertion.  Dr. Cliffton Asters evaluated the patient and all relevant studies and recommended proceeding with CABG as his best  revascularization option.  The risks and benefits of the procedure were discussed with the patient who agreed to proceed and he was admitted this hospitalization electively for the procedure.  Hospital course: The patient was taken the operating room on 03/04/2024 at which time he underwent CABG x 3.  He tolerated the procedure well and was taken to the surgical intensive care unit in stable condition.  The patient was extubated the evening of surgery.  He required Neo-synephrine post operatively which was weaned as hemodynamics allowed.  His arterial lines,  chest tubes, and pacing wires were removed without difficulty.  On the evening of postop day 1 he did develop urinary retention requiring replacement of Foley catheter.  Flomax was started.  He is also started on a course of diuretics for expected volume overload not clinically significant.  He does have an expected acute blood loss anemia and hemoglobin and hematocrit were stabilized.  He did not require transfusion.  He did develop some early infiltrates on chest x-ray and was started initially on IV antibiotics but this was discontinued on postop day 2.  His white blood cell count is being monitored as he does have a leukocytosis.  It is trending down over time.  He is not having any fevers.  He is not having any significant cough or sputum production but has been started on DuoNebs with aggressive pulmonary hygiene and oxygen is being weaned.  On postop day #2 he remained on 5 L nasal cannula.  On postop day #2 he was felt to be stable for transfer to the 4 E telemetry unit.  The patient developed SVT with rates in the 150s.  He was treated with IV Lopressor without decrease in heart rate.  He was ultimately started on Amiodarone drip protocol and titration of oral lopressor regimen.  His heart rate improved down into the 70s.  He was transitioned off the IV Amiodarone and started on an oral regimen of 200 mg BID.  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  He was resumed on Amiodarone drip with successful conversion to  NSR.  He continued to have issues with orthostatic changes which limited ability to titrate Lopressor. Midodrine has been added.   He was transitioned back to oral Amiodarone at 400 mg BID.  Cardiology consultation was obtained to help with rhythm management.  They recommended initiation of DOAC therapy for stroke prevention.   His foley catheter was removed for voiding trial.  He was able to void.  He was constipated and treated with lactulose and enema with  good result.  He was evaluated by PT/OT who recommended CIR.  Insurance authorization was done and he was transferred to CIR on 03/13/2024.Marland Kitchen  Consults: cardiology  Significant Diagnostic Studies: angiography:   3 vessel obstructive CAD. Suspect the culprit is the mid LCx bifurcation lesion with OM2 and OM3 Normal LVEDP Normal LV function   Plan: the LCx is a complex lesion. Given multivessel disease would recommend consideration for CABG. Will increase antianginal therapy with addition of Toprol XL 25 mg daily and Imdur 30 mg daily. Will arrange outpatient Echo and CT surgery consultation. Patient instructed to return to hospital if he has recurrent pain that does not respond to one sl Ntg. If after discussion patient does not want to consider surgery then we could perform complex stenting of the LCx and the mid RCA could also be stented. Recommend smoking cessation and optimization of lipid lowering therapy   Treatments: surgery:   03/04/2024 Patient:  Dave Williams Pre-Op Dx: 3V CAD   Post-op Dx:  same Procedure: CABG X 3.  LIMA LAD, RSVG PDA, OM   Endoscopic greater saphenous vein harvest on the right     Surgeon and Role:      * Lightfoot, Eliezer Lofts, MD - Primary    Webb Laws , PA-C - assisting An experienced assistant was required given the complexity of this surgery and the standard of surgical care. The assistant was needed for exposure, dissection, suctioning, retraction of delicate tissues and sutures, instrument exchange and for overall help during this procedure.    DG CHEST PORT 1 VIEW Result Date: 03/07/2024 CLINICAL DATA:  Right-sided pneumothorax. EXAM: PORTABLE CHEST 1 VIEW COMPARISON:  Chest CT dated 03/06/2024. FINDINGS: Right IJ central venous line in similar position. Similar appearance of small left pleural effusion and left lung base opacity. No detectable pneumothorax. Stable cardiomediastinal silhouette. Median sternotomy wires. No acute osseous pathology.  IMPRESSION: 1. No detectable pneumothorax. 2. Similar appearance of small left pleural effusion and left lung base opacity. Electronically Signed   By: Elgie Collard M.D.   On: 03/07/2024 12:14   DG Chest Port 1 View Result Date: 03/06/2024 CLINICAL DATA:  [id number] Pneumothorax [id number] EXAM: PORTABLE CHEST 1 VIEW COMPARISON:  03/05/2024 FINDINGS: Right IJ approach central venous catheter remains in place. Stable heart size dense post rounding and CABG. Left greater than right bibasilar opacities with small left and probable trace right pleural effusions. Aeration of the lung bases has slightly improved. No pneumothorax. IMPRESSION: Left greater than right bibasilar opacities with small left and probable trace right pleural effusions. Aeration of the lung bases has slightly improved. Electronically Signed   By: Duanne Guess D.O.   On: 03/06/2024 09:38   DG Chest Port 1 View Result Date: 03/05/2024 CLINICAL DATA:  Status post CABG. EXAM: PORTABLE CHEST 1 VIEW COMPARISON:  Chest radiograph dated 03/04/2024. FINDINGS: Right IJ central venous line in similar position. Interval removal of endotracheal and enteric tubes. There is shallow inspiration with bilateral mid to lower lung field airspace densities, likely  atelectasis. Pneumonia is not excluded. Small bilateral pleural effusions suspected. No pneumothorax. Stable cardiac silhouette. Median sternotomy wires. No acute osseous pathology. IMPRESSION: 1. Interval removal of endotracheal and enteric tubes. 2. Shallow inspiration with bilateral atelectasis. Electronically Signed   By: Elgie Collard M.D.   On: 03/05/2024 10:26   DG Abd 1 View Result Date: 03/04/2024 CLINICAL DATA:  Orogastric tube placement. EXAM: ABDOMEN - 1 VIEW COMPARISON:  Portable chest obtained at the same time. FINDINGS: The orogastric tube side hole is projected in the region of the gastroesophageal junction on the current image with the tip in the proximal to mid stomach. It is  recommended that the tube be advanced 5 cm. Previously described postoperative changes in the chest and patchy atelectasis or aspiration pneumonitis in the left lower lobe. Normal bowel gas pattern. Mild lumbar and lower thoracic spine degenerative changes. IMPRESSION: Orogastric tube side hole is projected in the region of the gastroesophageal junction on the current image with the tip in the proximal to mid stomach. It is recommended that the tube be advanced 5 cm. Electronically Signed   By: Beckie Salts M.D.   On: 03/04/2024 14:45   DG Chest Port 1 View Result Date: 03/04/2024 CLINICAL DATA:  Status post CABG. EXAM: PORTABLE CHEST 1 VIEW COMPARISON:  02/29/2024 FINDINGS: Interval post CABG changes. Endotracheal tube in satisfactory position. Nasogastric tube side hole just inferior to the gastroesophageal junction with the tip not included. Right jugular catheter tip in the superior vena cava. No pneumothorax. Interval patchy density in the left lower lobe persistent nodular density in the left mid lung zone. Interval borderline enlargement of the cardiac silhouette. Lower thoracic spine degenerative changes. IMPRESSION: 1. Interval post CABG changes. 2. Interval patchy density in the left lower lobe, likely atelectasis. Aspiration pneumonitis could also have this appearance. 3. Persistent nodular density in the left mid lung zone. Chest CT with contrast is recommended once the patient's postoperative changes have cleared. Electronically Signed   By: Beckie Salts M.D.   On: 03/04/2024 14:43   ECHO INTRAOPERATIVE TEE Result Date: 03/04/2024  *INTRAOPERATIVE TRANSESOPHAGEAL REPORT *  Patient Name:   Dave Williams Date of Exam: 03/04/2024 Medical Rec #:  [id number]         Height: Accession #:    [id number]       Weight: Date of Birth:  05/27/1956        BSA: Patient Age:    68 years         BP:           140/70 mmHg Patient Gender: M                HR:           70 bpm. Exam Location:  Anesthesiology  Transesophogeal exam was perform intraoperatively during surgical procedure. Patient was closely monitored under general anesthesia during the entirety of examination. Indications:     CAD Performing Phys: Leslye Peer MD Diagnosing Phys: Leslye Peer MD Complications: No known complications during this procedure. POST-OP IMPRESSIONS _ Left Ventricle: The left ventricle is unchanged from pre-bypass. _ Right Ventricle: The right ventricle appears unchanged from pre-bypass. _ Aorta: The aorta appears unchanged from pre-bypass. _ Left Atrium: The left atrium appears unchanged from pre-bypass. _ Left Atrial Appendage: The left atrial appendage appears unchanged from pre-bypass. _ Aortic Valve: The aortic valve appears unchanged from pre-bypass. _ Mitral Valve: The mitral valve appears unchanged from pre-bypass. _ Tricuspid Valve: The tricuspid valve appears  unchanged from pre-bypass. _ Pulmonic Valve: The pulmonic valve appears unchanged from pre-bypass. _ Interatrial Septum: The interatrial septum appears unchanged from pre-bypass. _ Interventricular Septum: The interventricular septum appears unchanged from pre-bypass. _ Pericardium: The pericardium appears unchanged from pre-bypass. PRE-OP FINDINGS  Left Ventricle: The left ventricle has normal systolic function, with an ejection fraction of 55-60%. The cavity size was normal. No evidence of left ventricular regional wall motion abnormalities. There is no left ventricular hypertrophy. Right Ventricle: The right ventricle has normal systolic function. The cavity was normal. There is no increase in right ventricular wall thickness. Left Atrium: Left atrial size was normal in size. No left atrial/left atrial appendage thrombus was detected. Right Atrium: Right atrial size was normal in size. Interatrial Septum: No atrial level shunt detected by color flow Doppler. Pericardium: There is no evidence of pericardial effusion. Mitral Valve: The mitral valve is normal in  structure. Mitral valve regurgitation is trivial by color flow Doppler. There is No evidence of mitral stenosis. Tricuspid Valve: The tricuspid valve was normal in structure. Tricuspid valve regurgitation is trivial by color flow Doppler. No evidence of tricuspid stenosis is present. Aortic Valve: The aortic valve is tricuspid Aortic valve regurgitation was not visualized by color flow Doppler. There is no stenosis of the aortic valve. Pulmonic Valve: The pulmonic valve was normal in structure. Pulmonic valve regurgitation is trivial by color flow Doppler. Aorta: The aortic root, ascending aorta and aortic arch are normal in size and structure.  Leslye Peer MD Electronically signed by Leslye Peer MD Signature Date/Time: 03/04/2024/12:40:58 PM    Final    VAS US DOPPLER PRE CABG Result Date: 03/01/2024 PREOPERATIVE VASCULAR EVALUATION Patient Name:  Dave Williams  Date of Exam:   02/29/2024 Medical Rec #: [id number]         Accession #:    [id number] Date of Birth: 05-28-1956         Patient Gender: M Patient Age:   81 years Exam Location:  [facility] Procedure:      VAS US DOPPLER PRE CABG Referring Phys: HARRELL LIGHTFOOT --------------------------------------------------------------------------------  Indications:      Pre-CABG. Risk Factors:     Hypertension, hyperlipidemia, current smoker, coronary artery                   disease. Comparison Study: No prior study on file Performing Technologist: Sherren Kerns RVS  Examination Guidelines: A complete evaluation includes B-mode imaging, spectral Doppler, color Doppler, and power Doppler as needed of all accessible portions of each vessel. Bilateral testing is considered an integral part of a complete examination. Limited examinations for reoccurring indications may be performed as noted.  Right Carotid Findings: +----------+--------+--------+--------+--------+------------------+           PSV cm/sEDV cm/sStenosisDescribeComments            +----------+--------+--------+--------+--------+------------------+ CCA Prox  101     23                      intimal thickening +----------+--------+--------+--------+--------+------------------+ CCA Distal96      27                      intimal thickening +----------+--------+--------+--------+--------+------------------+ ICA Prox  80      19                                         +----------+--------+--------+--------+--------+------------------+  ICA Mid   60      23                                         +----------+--------+--------+--------+--------+------------------+ ICA Distal76      28                                         +----------+--------+--------+--------+--------+------------------+ ECA       80      11                                         +----------+--------+--------+--------+--------+------------------+ +----------+--------+-------+--------+------------+           PSV cm/sEDV cmsDescribeArm Pressure +----------+--------+-------+--------+------------+ Subclavian83                                  +----------+--------+-------+--------+------------+ +---------+--------+--+--------+--+ VertebralPSV cm/s57EDV cm/s16 +---------+--------+--+--------+--+ Left Carotid Findings: +----------+--------+--------+--------+------------------------+--------+           PSV cm/sEDV cm/sStenosisDescribe                Comments +----------+--------+--------+--------+------------------------+--------+ CCA Prox  110     22              heterogenous                     +----------+--------+--------+--------+------------------------+--------+ CCA Distal93      20              heterogenous                     +----------+--------+--------+--------+------------------------+--------+ ICA Prox  44      16              calcific and homogeneous         +----------+--------+--------+--------+------------------------+--------+ ICA Mid    75      29                                               +----------+--------+--------+--------+------------------------+--------+ ICA Distal70      29                                               +----------+--------+--------+--------+------------------------+--------+ ECA       78      15                                               +----------+--------+--------+--------+------------------------+--------+  +----------+--------+--------+--------+------------+ SubclavianPSV cm/sEDV cm/sDescribeArm Pressure +----------+--------+--------+--------+------------+           160                                  +----------+--------+--------+--------+------------+ +---------+--------+--+--------+--+ VertebralPSV cm/s54EDV cm/s13 +---------+--------+--+--------+--+  ABI Findings: +------------------+-----+---------+ Rt Pressure (mmHg)IndexWaveform  +------------------+-----+---------+ 145                    triphasic +------------------+-----+---------+ 131               0.90 biphasic  +------------------+-----+---------+ 122               0.84 biphasic  +------------------+-----+---------+ 127               0.88           +------------------+-----+---------+ +------------------+-----+---------+ Lt Pressure (mmHg)IndexWaveform  +------------------+-----+---------+ 125                    triphasic +------------------+-----+---------+ 146               1.01 triphasic +------------------+-----+---------+ 154               1.06 triphasic +------------------+-----+---------+ 138               0.95           +------------------+-----+---------+ +-------+---------------+ ABI/TBIToday's ABI/TBI +-------+---------------+ Right  0.90/0.88       +-------+---------------+ Left   1.06/0.95       +-------+---------------+  Right Doppler Findings: +--------+--------+---------+ Site    PressureDoppler   +--------+--------+---------+ [id number]      triphasic +--------+--------+---------+ Radial          triphasic +--------+--------+---------+ Ulnar           triphasic +--------+--------+---------+  Left Doppler Findings: +--------+--------+---------+ Site    PressureDoppler   +--------+--------+---------+ [id number]     triphasic +--------+--------+---------+ Radial          triphasic +--------+--------+---------+ Ulnar           triphasic +--------+--------+---------+   Summary: Right Carotid: Velocities in the right ICA are consistent with a 1-39% stenosis. Left Carotid: Velocities in the left ICA are consistent with a 1-39% stenosis. Vertebrals:  Bilateral vertebral arteries demonstrate antegrade flow. Subclavians: Normal flow hemodynamics were seen in bilateral subclavian              arteries. Right ABI: Resting right ankle-brachial index indicates mild right lower extremity arterial disease. The right toe-brachial index is normal. Left ABI: Resting left ankle-brachial index is within normal range. The left toe-brachial index is normal. Right Upper Extremity: Doppler waveforms remain within normal limits with right radial compression. Doppler waveforms remain within normal limits with right ulnar compression. Left Upper Extremity: Doppler waveform obliterate with left radial compression. Doppler waveforms decrease >50% with left ulnar compression.  Electronically signed by Lemar Livings MD on 03/01/2024 at 2:22:07 PM.    Final    DG Chest 2 View Result Date: 02/29/2024 CLINICAL DATA:  Preop.  Coronary artery disease. EXAM: CHEST - 2 VIEW COMPARISON:  Chest radiograph 11/03/2023. No prior chest CT available. FINDINGS: The heart is normal in size. Stable mediastinal contours with aortic atherosclerosis. Persistent ill-defined nodular density in the left mid lung, potentially anteriorly on the lateral view. No evidence of acute airspace disease, pleural effusion or pneumothorax. Normal pulmonary vasculature. Mild thoracic  spondylosis. IMPRESSION: 1. Persistent ill-defined nodular density in the left mid lung. Recommend further evaluation with chest CT to further characterize. 2. No acute findings. Electronically Signed   By: Narda Rutherford M.D.   On: 02/29/2024 12:36      Results for orders placed or performed during the hospital encounter of 03/04/24 (from the past 24 hours)  Basic metabolic panel  Status: Abnormal   Collection Time: 03/13/24  3:44 AM  Result Value Ref Range   Sodium 133 (L) 135 - 145 mmol/L   Potassium 4.0 3.5 - 5.1 mmol/L   Chloride 100 98 - 111 mmol/L   CO2 24 22 - 32 mmol/L   Glucose, Bld 106 (H) 70 - 99 mg/dL   BUN 17 8 - 23 mg/dL   Creatinine, Ser 1.61 (H) 0.61 - 1.24 mg/dL   Calcium 8.8 (L) 8.9 - 10.3 mg/dL   GFR, Estimated >09 >60 mL/min   Anion gap 9 5 - 15    Discharge Exam: Blood pressure 110/70, pulse 78, temperature 98.1 F (36.7 C), temperature source Oral, resp. rate 16, height 6\' 3"  (1.905 m), weight 101.6 kg, SpO2 91%.    General appearance: alert, cooperative, and no distress Heart: regular rate and rhythm Lungs: clear to auscultation bilaterally Abdomen: benign Extremities: no edema Wound: incis healing well  Discharge Medications:  The patient has been discharged on:   1.Beta Blocker:  Yes [ x  ]                              No   [   ]                              If No, reason:  2.Ace Inhibitor/ARB: Yes [   ]                                     No  [   n ]                                     If No, reason:labile BP  3.Statin:   Yes [ x  ]                  No  [   ]                  If No, reason:  4.Ecasa:  Yes  [  x ]                  No   [   ]                  If No, reason:  Patient had ACS upon admission: No  Plavix/P2Y12 inhibitor: Yes [   ]                                      No  [  X ]     Discharge Instructions     Amb Referral to Cardiac Rehabilitation   Complete by: As directed    Diagnosis: CABG   CABG X ___: 3    After initial evaluation and assessments completed: Virtual Based Care may be provided alone or in conjunction with Phase 2 Cardiac Rehab based on patient barriers.: Yes   Intensive Cardiac Rehabilitation (ICR) [facility] location only OR Traditional Cardiac Rehabilitation (TCR) *If criteria for ICR are not met will enroll in TCR [facility] only): Yes      Allergies as of 03/13/2024  No Known Allergies      Medication List     STOP taking these medications    amLODipine 5 MG tablet Commonly known as: NORVASC   isosorbide mononitrate 30 MG 24 hr tablet Commonly known as: IMDUR       TAKE these medications    amiodarone 200 MG tablet Commonly known as: PACERONE Take 2 tablets (400 mg total) by mouth 2 (two) times daily. Until 4/3.Marland Kitchen then beginning 4/4 decrease to 200 mg daily   apixaban 5 MG Tabs tablet Commonly known as: ELIQUIS Take 1 tablet (5 mg total) by mouth 2 (two) times daily.   aspirin EC 81 MG tablet Take 1 tablet (81 mg total) by mouth daily. Swallow whole. What changed: Another medication with the same name was removed. Continue taking this medication, and follow the directions you see here.   atorvastatin 80 MG tablet Commonly known as: LIPITOR Take 1 tablet (80 mg total) by mouth daily. What changed:  medication strength how much to take   HYDROcodone-acetaminophen 7.5-325 MG tablet Commonly known as: NORCO Take 1 tablet by mouth every 6 (six) hours as needed for moderate pain (pain score 4-6).   metoprolol succinate 25 MG 24 hr tablet Commonly known as: Toprol XL Take 1 tablet (25 mg total) by mouth daily.   midodrine 5 MG tablet Commonly known as: PROAMATINE Take 1 tablet (5 mg total) by mouth 3 (three) times daily with meals.   nicotine 14 mg/24hr patch Commonly known as: NICODERM CQ - dosed in mg/24 hours Place 1 patch (14 mg total) onto the skin daily.   nitroGLYCERIN 0.4 MG SL tablet Commonly known as: NITROSTAT DISSOLVE 1 TABLET UNDER THE TONGUE  EVERY 5 MINUTES AS NEEDED FOR CHEST PAIN. DO NOT EXCEED A TOTAL OF 3 DOSES IN 15 MINUTES.   tamsulosin 0.4 MG Caps capsule Commonly known as: FLOMAX Take 1 capsule (0.4 mg total) by mouth daily.   Vitamin D (Ergocalciferol) 1.25 MG (50000 UNIT) Caps capsule Commonly known as: DRISDOL Take 50,000 Units by mouth once a week.        Follow-up Information     [facility] Follow up on 03/26/2024.   Specialty: Cardiology Why: Appointment is at 10:05 with Royanne Foots information: [address] [place] [address] [phone]        Corliss Skains, MD Follow up on 03/22/2024.   Specialty: Cardiothoracic Surgery Why: Appointment is at 3:50.. This is a virtual appointment, you do not need to come to the office.. Dr. Cliffton Asters will call you Contact information: [address] [place] [address] [phone]                 Signed:  [place] Clack, PA-C  03/13/2024, 9:42 AM

## 2024-03-05 NOTE — TOC Initial Note (Signed)
 Transition of Care [facility]) - Initial/Assessment Note    Patient Details  Name: Dave Williams MRN: [id number] Date of Birth: Oct 25, 1956  Transition of Care [facility]) CM/SW Contact:    Gala Lewandowsky, RN Phone Number: 03/05/2024, 11:50 AM  Clinical Narrative:  Patient was discussed in morning progression rounds-POD-1 CABG. PTA patient states he was independent from home alone. Patient will be followed by [facility] per TCTS office protocol. Case Manager will continue to follow for transition of care needs as the patient progresses.                  Expected Discharge Plan: Home w Home Health Services Barriers to Discharge: Continued Medical Work up   Patient Goals and CMS Choice Patient states their goals for this hospitalization and ongoing recovery are:: plan to retrun home   Choice offered to / list presented to : NA      Expected Discharge Plan and Services In-house Referral: NA Discharge Planning Services: CM Consult Post Acute Care Choice: Home Health Living arrangements for the past 2 months: Single Family Home                   DME Agency: NA    Prior Living Arrangements/Services Living arrangements for the past 2 months: Single Family Home Lives with:: Self Patient language and need for interpreter reviewed:: Yes Do you feel safe going back to the place where you live?: Yes      Need for Family Participation in Patient Care: Yes (Comment)     Criminal Activity/Legal Involvement Pertinent to Current Situation/Hospitalization: No - Comment as needed  Activities of Daily Living   ADL Screening (condition at time of admission) Independently performs ADLs?: Yes (appropriate for developmental age) Is the patient deaf or have difficulty hearing?: No Does the patient have difficulty seeing, even when wearing glasses/contacts?: No Does the patient have difficulty concentrating, remembering, or making decisions?: No  Permission  Sought/Granted Permission sought to share information with : Case Manager   Emotional Assessment Appearance:: Appears stated age Attitude/Demeanor/Rapport: Engaged Affect (typically observed): Appropriate Orientation: : Oriented to Self, Oriented to Place, Oriented to  Time, Oriented to Situation Alcohol / Substance Use: Not Applicable Psych Involvement: No (comment)  Admission diagnosis:  S/P CABG x 3 [Z95.1] Patient Active Problem List   Diagnosis Date Noted   S/P CABG x 3 03/04/2024   CAD (coronary artery disease) 12/22/2023   Chest pain of uncertain etiology 11/27/2023   Cerebrovascular accident (HCC) 07/05/2023   History of hypertension 07/05/2023   Hypercholesterolemia 07/05/2023   PCP:  Oneal Grout, FNP Pharmacy:   [facility] - [place], [place] - [address] [place] [address] Phone: [phone] Fax: [phone]  Social Drivers of Health (SDOH) Social History: SDOH Screenings   Food Insecurity: No Food Insecurity (03/05/2024)  Housing: Low Risk  (03/05/2024)  Transportation Needs: No Transportation Needs (03/05/2024)  Utilities: Not At Risk (03/05/2024)  Social Connections: Moderately Integrated (03/05/2024)  Tobacco Use: High Risk (03/04/2024)   Readmission Risk Interventions     No data to display

## 2024-03-05 NOTE — Progress Notes (Signed)
 Patient ID: Dave Williams, male   DOB: 03-24-1956, 68 y.o.   MRN: [id number]  TCTS Evening Rounds:  Hemodynamically stable.  PW out today. Chest tube output low.  Urinary retention this pm. 400 cc on bladder scan and can't pee. Had a little bit of blood when foley removed earlier today. Probably some prostate trauma. Will insert foley and leave in for a day or two. Start Flomax.  BMET    Component Value Date/Time   NA 134 (L) 03/05/2024 1538   NA 140 11/27/2023 1439   K 4.0 03/05/2024 1538   CL 102 03/05/2024 1538   CO2 26 03/05/2024 1538   GLUCOSE 123 (H) 03/05/2024 1538   BUN 9 03/05/2024 1538   BUN 21 11/27/2023 1439   CREATININE 0.98 03/05/2024 1538   CALCIUM 8.2 (L) 03/05/2024 1538   EGFR 60 11/27/2023 1439   GFRNONAA >60 03/05/2024 1538   CBC    Component Value Date/Time   WBC 12.8 (H) 03/05/2024 1538   RBC 3.13 (L) 03/05/2024 1538   HGB 9.9 (L) 03/05/2024 1538   HGB 15.4 11/27/2023 1439   HCT 29.6 (L) 03/05/2024 1538   HCT 48.2 11/27/2023 1439   PLT 148 (L) 03/05/2024 1538   PLT 225 11/27/2023 1439   MCV 94.6 03/05/2024 1538   MCV 95 11/27/2023 1439   MCH 31.6 03/05/2024 1538   MCHC 33.4 03/05/2024 1538   RDW 13.3 03/05/2024 1538   RDW 12.4 11/27/2023 1439   LYMPHSABS 3.4 11/03/2023 1227   MONOABS 0.7 11/03/2023 1227   EOSABS 0.2 11/03/2023 1227   BASOSABS 0.1 11/03/2023 1227   Refused to walk today.

## 2024-03-05 NOTE — Consult Note (Addendum)
 NAME:  Dave Williams, MRN:  [id number], DOB:  01/22/1956, LOS: 1 ADMISSION DATE:  03/04/2024, CONSULTATION DATE:  3/24 REFERRING MD:  Cliffton Asters, CHIEF COMPLAINT:  s/p CABG   History of Present Illness:  68 year old male with past medical history of stroke, hypertension, CAD who presents to [facility] today for CABG.  Seen by Dr. Cliffton Asters after having 2 ER visits for worsening chest pain and shortness of breath.  He subsequently had an left heart cath on 11/28/2023 showing three-vessel obstructive CAD.  Additionally, he had an echocardiogram at that time showing LVEF 65 to 70%, no regional wall motion abnormalities and G1DD.  Presented 3/24 for CABG.  PCCM consulted for postoperative management and CVICU.  Pump time: 1h 45m Xclamp time: 59m EBL: 329cc UOP: 1180cc Cell saver: 230cc   Pertinent  Medical History  Stroke, hypertension, CAD  Significant Hospital Events: Including procedures, antibiotic start and stop dates in addition to other pertinent events   3/24 s/p CABG x 3 LIMA to LAD, RSVG to PDA, OM, extubated post-op in ICU.  Interim History / Subjective:  Some pain today. Coughing up sputum today- normal for him. Tmax 100.6  Objective   Blood pressure 122/73, pulse 86, temperature 99.3 F (37.4 C), temperature source Bladder, resp. rate 14, height 6\' 3"  (1.905 m), weight 105.5 kg, SpO2 90%. CVP:  [2 mmHg-14 mmHg] 7 mmHg CO:  [5 L/min-8 L/min] 7 L/min CI:  [2.2 L/min/m2-3.3 L/min/m2] 3 L/min/m2  Vent Mode: CPAP;PSV FiO2 (%):  [40 %-50 %] 40 % Set Rate:  [4 bmp-16 bmp] 4 bmp Vt Set:  [600 mL-670 mL] 670 mL PEEP:  [5 cmH20] 5 cmH20 Pressure Support:  [5 cmH20-10 cmH20] 5 cmH20 Plateau Pressure:  [18 cmH20-22 cmH20] 18 cmH20   Intake/Output Summary (Last 24 hours) at 03/05/2024 0732 Last data filed at 03/05/2024 0700 Gross per 24 hour  Intake 6133.05 ml  Output 4308 ml  Net 1825.05 ml   Filed Weights   03/04/24 0550 03/05/24 0500  Weight: 101.6 kg 105.5 kg     Examination: General: chronically ill appearing man sitting up in bed in NAD  HENT: [place]/AT, eyes anicteric Lungs: breathing comfortably on [place], no rhales or wheezing. Cardiovascular: S1S2, RRR Abdomen: soft, NT Extremities: minimal peripheral edema, no cyanosis  Neuro: awake, alert, answering questions appropriately. GU: foley with amber urine  BUN 9 Cr 0.89 WBC 16 H/H 11.2/33.4 Platelets 206 CXR personally reviewed> LLL consolidation silhouetting heart border. Chest tubes in place.  EKG:   Resolved Hospital Problem list    Assessment & Plan:  Multivessel coronary artery disease s/p CABG x3 LIMA to LAD, RSVG to PDA, OM;  EF 55-60% Post bypass vasoplegia -post-op care per TCTS -wean off neosynephrine for MAP >65; keep Aline & CVC today -pain control per protocol -post-op antibiotics; escalating due to concern for pneumonia based on CXR.  -tele monitoring -progress diet and mobility today -hold metoprolol until off pressors -aspirin, statin> increased to high intensity dosing -recommended tobacco cessation  Post operative vent management  Tobacco abuse  Concern for LLL pneumonia -add unasyn for aspiration coverage; collect sputum sample -wean O2 as able to maintain SpO2 >90% -nicotine patch, counseled for cessation. He is motivated at this time to quit. -duonebs PRN  Expected post-operative ABLA Expected post-operative consumptive thrombocytopenia  -no current need for transfusion  Hypertension  -hold PTA imdur, amlodipine  Hyperglycemia, prediabetes. A1c 5.8 -transition off insulin gtt to basal bolus  History of stroke  -aspirin, statin, smoking cessation  L  mid-lung nodule- has been present on multiple CXR over time -needs CT when more stable to further characterize  Dental carries -needs to see a dentist after discharge  Best Practice (right click and "Reselect all SmartList Selections" daily)   Diet/type: Regular consistency (see orders) DVT  prophylaxis: LMWH Pressure ulcer(s): none  GI prophylaxis: H2B Lines: Central line, Arterial Line, and yes and it is still needed Foley:  Yes, and it is still needed Code Status:  full code Last date of multidisciplinary goals of care discussion [pending]  Labs   CBC: Recent Labs  Lab 02/29/24 1053 03/04/24 0822 03/04/24 1058 03/04/24 1153 03/04/24 1253 03/04/24 1256 03/04/24 1632 03/04/24 1634 03/04/24 1748 03/04/24 1927 03/05/24 0413  WBC 9.0  --   --   --  19.7*  --  14.7*  --   --   --  16.0*  HGB 16.4   < > 11.2*   < > 11.2*   < > 11.5* 11.9* 10.2* 9.9* 11.2*  HCT 49.1   < > 33.1*   < > 33.9*   < > 33.6* 35.0* 30.0* 29.0* 33.4*  MCV 94.6  --   --   --  95.5  --  93.1  --   --   --  94.1  PLT 247  --  175  --  169  --  174  --   --   --  206   < > = values in this interval not displayed.    Basic Metabolic Panel: Recent Labs  Lab 02/29/24 1053 03/04/24 0822 03/04/24 0948 03/04/24 1027 03/04/24 1056 03/04/24 1153 03/04/24 1157 03/04/24 1256 03/04/24 1632 03/04/24 1634 03/04/24 1748 03/04/24 1927 03/05/24 0413  NA 136   < > 135   < > 133*   < > 134*   < > 136 137 140 139 137  K 5.1   < > 5.3*   < > 6.3*   < > 5.2*   < > 4.2 4.2 3.5 3.4* 4.4  CL 102   < > 105  --  104  --  105  --  107  --   --   --  107  CO2 27  --   --   --   --   --   --   --  23  --   --   --  24  GLUCOSE 117*   < > 137*  --  128*  --  120*  --  128*  --   --   --  115*  BUN 16   < > 16  --  15  --  13  --  11  --   --   --  9  CREATININE 1.31*   < > 1.10  --  1.00  --  0.90  --  0.95  --   --   --  0.89  CALCIUM 9.6  --   --   --   --   --   --   --  8.1*  --   --   --  7.9*  MG  --   --   --   --   --   --   --   --  2.8*  --   --   --  2.3   < > = values in this interval not displayed.   GFR: Estimated Creatinine Clearance: 107.3 mL/min (by C-G formula based on SCr of 0.89  mg/dL). Recent Labs  Lab 02/29/24 1053 03/04/24 1253 03/04/24 1632 03/05/24 0413  WBC 9.0 19.7* 14.7*  16.0*    Liver Function Tests: Recent Labs  Lab 02/29/24 1053  AST 36  ALT 37  ALKPHOS 40  BILITOT 0.9  PROT 7.4  ALBUMIN 4.3    Critical care time:       This patient is critically ill with multiple organ system failure which requires frequent high complexity decision making, assessment, support, evaluation, and titration of therapies. This was completed through the application of advanced monitoring technologies and extensive interpretation of multiple databases. During this encounter critical care time was devoted to patient care services described in this note for 36 minutes.  Steffanie Dunn, DO 03/05/24 8:13 AM [facility]  For contact information, see Amion. If no response to pager, please call PCCM consult pager. After hours, 7PM- 7AM, please call Elink.

## 2024-03-06 ENCOUNTER — Inpatient Hospital Stay (HOSPITAL_COMMUNITY)

## 2024-03-06 DIAGNOSIS — R739 Hyperglycemia, unspecified: Secondary | ICD-10-CM

## 2024-03-06 LAB — GLUCOSE, CAPILLARY
Glucose-Capillary: 118 mg/dL — ABNORMAL HIGH (ref 70–99)
Glucose-Capillary: 181 mg/dL — ABNORMAL HIGH (ref 70–99)
Glucose-Capillary: 109 mg/dL — ABNORMAL HIGH (ref 70–99)
Glucose-Capillary: 113 mg/dL — ABNORMAL HIGH (ref 70–99)
Glucose-Capillary: 136 mg/dL — ABNORMAL HIGH (ref 70–99)

## 2024-03-06 LAB — CBC
HCT: 29.8 % — ABNORMAL LOW (ref 39.0–52.0)
Hemoglobin: 9.9 g/dL — ABNORMAL LOW (ref 13.0–17.0)
MCH: 31.3 pg (ref 26.0–34.0)
MCHC: 33.2 g/dL (ref 30.0–36.0)
MCV: 94.3 fL (ref 80.0–100.0)
Platelets: 144 10*3/uL — ABNORMAL LOW (ref 150–400)
RBC: 3.16 MIL/uL — ABNORMAL LOW (ref 4.22–5.81)
RDW: 13.2 % (ref 11.5–15.5)
WBC: 12.8 10*3/uL — ABNORMAL HIGH (ref 4.0–10.5)
nRBC: 0 % (ref 0.0–0.2)

## 2024-03-06 LAB — BASIC METABOLIC PANEL
Anion gap: 7 (ref 5–15)
BUN: 10 mg/dL (ref 8–23)
CO2: 25 mmol/L (ref 22–32)
Calcium: 8.5 mg/dL — ABNORMAL LOW (ref 8.9–10.3)
Chloride: 102 mmol/L (ref 98–111)
Creatinine, Ser: 0.97 mg/dL (ref 0.61–1.24)
GFR, Estimated: >60 mL/min (ref >60)
Glucose, Bld: 131 mg/dL — ABNORMAL HIGH (ref 70–99)
Potassium: 4 mmol/L (ref 3.5–5.1)
Sodium: 134 mmol/L — ABNORMAL LOW (ref 135–145)

## 2024-03-06 LAB — LIPID PANEL
Cholesterol: 94 mg/dL (ref 0–200)
HDL: 22 mg/dL — ABNORMAL LOW (ref >40)
LDL Cholesterol: 50 mg/dL (ref 0–99)
Total CHOL/HDL Ratio: 4.3 ratio
Triglycerides: 112 mg/dL (ref <150)
VLDL: 22 mg/dL (ref 0–40)

## 2024-03-06 MED ORDER — POTASSIUM CHLORIDE CRYS ER 20 MEQ PO TBCR
20.0000 meq | EXTENDED_RELEASE_TABLET | Freq: Every day | ORAL | Status: DC
  Administered 2024-03-06: 20 meq via ORAL
  Filled 2024-03-06: qty 1

## 2024-03-06 MED ORDER — ~~LOC~~ CARDIAC SURGERY, PATIENT & FAMILY EDUCATION
Freq: Once | Status: AC
Start: 2024-03-06 — End: 2024-03-06

## 2024-03-06 MED ORDER — SODIUM CHLORIDE 0.9% FLUSH: 3.0000 mL | INTRAVENOUS | Status: DC | PRN

## 2024-03-06 MED ORDER — POTASSIUM CHLORIDE CRYS ER 20 MEQ PO TBCR: 20.0000 meq | EXTENDED_RELEASE_TABLET | Freq: Every day | ORAL | Status: DC

## 2024-03-06 MED ORDER — SODIUM CHLORIDE 0.9% FLUSH
3.0000 mL | Freq: Two times a day (BID) | INTRAVENOUS | Status: DC
  Administered 2024-03-06 – 2024-03-13 (×13): 3 mL via INTRAVENOUS

## 2024-03-06 MED ORDER — SODIUM CHLORIDE 0.9 % IV SOLN: 250.0000 mL | INTRAVENOUS | Status: AC | PRN

## 2024-03-06 MED ORDER — FUROSEMIDE 40 MG PO TABS
40.0000 mg | ORAL_TABLET | Freq: Every day | ORAL | Status: DC
  Administered 2024-03-06 – 2024-03-07 (×2): 40 mg via ORAL
  Filled 2024-03-06 (×2): qty 1

## 2024-03-06 MED ORDER — CHLORHEXIDINE GLUCONATE CLOTH 2 % EX PADS
6.0000 | MEDICATED_PAD | Freq: Every day | CUTANEOUS | Status: DC
Start: 2024-03-06 — End: 2024-03-13
  Administered 2024-03-06 – 2024-03-12 (×7): 6 via TOPICAL

## 2024-03-06 NOTE — Progress Notes (Signed)
 NAME:  Dave Williams, MRN:  [id number], DOB:  10-18-1956, LOS: 2 ADMISSION DATE:  03/04/2024, CONSULTATION DATE:  3/24 REFERRING MD:  Cliffton Asters, CHIEF COMPLAINT:  s/p CABG   History of Present Illness:  68 year old male with past medical history of stroke, hypertension, CAD who presents to [facility] today for CABG.  Seen by Dr. Cliffton Asters after having 2 ER visits for worsening chest pain and shortness of breath.  He subsequently had an left heart cath on 11/28/2023 showing three-vessel obstructive CAD.  Additionally, he had an echocardiogram at that time showing LVEF 65 to 70%, no regional wall motion abnormalities and G1DD.  Presented 3/24 for CABG.  PCCM consulted for postoperative management and CVICU.  Pump time: 1h 30m Xclamp time: 78m EBL: 329cc UOP: 1180cc Cell saver: 230cc  Pertinent  Medical History  Stroke, hypertension, CAD  Significant Hospital Events: Including procedures, antibiotic start and stop dates in addition to other pertinent events   3/24 s/p CABG x 3 LIMA to LAD, RSVG to PDA, OM, extubated post-op in ICU.  Interim History / Subjective:  Pain controlled, no SOB, cough same as baseline.  Afebrile overnight.   Objective   Blood pressure 114/68, pulse 87, temperature 98.5 F (36.9 C), temperature source Oral, resp. rate (!) 22, height 6\' 3"  (1.905 m), weight 106 kg, SpO2 93%. CVP:  [0 mmHg-17 mmHg] 0 mmHg CO:  [6.5 L/min-9.1 L/min] 8.6 L/min CI:  [2.8 L/min/m2-4 L/min/m2] 3.7 L/min/m2      Intake/Output Summary (Last 24 hours) at 03/06/2024 0732 Last data filed at 03/06/2024 0600 Gross per 24 hour  Intake 778.14 ml  Output 1100 ml  Net -321.86 ml   Filed Weights   03/04/24 0550 03/05/24 0500 03/06/24 0500  Weight: 101.6 kg 105.5 kg 106 kg    Examination: General: chronically ill appearing man sitting in recliner in NAD HENT: [place]/AT,eyes anicteric Lungs: breathing comfortably on [place], improved breath sounds in left base. Chest tubes with minimal additional  output.  Cardiovascular: S1S2, RRR Abdomen: soft, NT Extremities: no significant edema, no cyanosis Neuro: awake, alert, normal speech, answering questions appropriately. Derm: warm, dry, no rashes  BUN 10 Cr 0.97 WBC 12.8 H/H  9.9/29.8 Platelets 144 CXR personally reviewed>  small left effusion, improved opacification of LLL  Resolved Hospital Problem list    Assessment & Plan:  Multivessel coronary artery disease s/p CABG x3 LIMA to LAD, RSVG to PDA, OM;  EF 55-60% Post bypass vasoplegia -post-op care per TCTS  -con't advancing mobility and diet -aspirin, statin, metoprolol  -pain control per protocol -tele monitoring -recommended tobacco cessation  Post operative vent management  Tobacco abuse  Concern for LLL pneumonia -holding antibiotics due to concern for mucus plugging rather than pneumonia -pulmonary hygiene -wean O2 -nicotine patch, needs cessation of tobacco use -duonebs PRN  Expected post-operative ABLA Expected post-operative consumptive thrombocytopenia  -monitor, no need for transfusion  Hypertension  -con't to hold PTA imdur, amlodipine  Hyponatremia -avoid hypotonic fluids  Hyperglycemia, prediabetes. A1c 5.8 -SSI PRN-- well controlled with this -goal BG <180  History of stroke  -aspirin, statin, tobacco cessation  L mid-lung nodule- has been present on multiple CXR over time -needs CT when more stable to further characterize  Dental carries -needs to see a dentist after discharge  Best Practice (right click and "Reselect all SmartList Selections" daily)   Diet/type: Regular consistency (see orders) DVT prophylaxis: LMWH Pressure ulcer(s): none  GI prophylaxis: PPI Lines: Central line and yes and it is still needed Foley:  Yes, and it is still needed  Code Status:  full code Last date of multidisciplinary goals of care discussion [pending]  Labs   CBC: Recent Labs  Lab 03/04/24 1253 03/04/24 1256 03/04/24 1632 03/04/24 1634  03/04/24 1748 03/04/24 1927 03/05/24 0413 03/05/24 1538 03/06/24 0430  WBC 19.7*  --  14.7*  --   --   --  16.0* 12.8* 12.8*  HGB 11.2*   < > 11.5*   < > 10.2* 9.9* 11.2* 9.9* 9.9*  HCT 33.9*   < > 33.6*   < > 30.0* 29.0* 33.4* 29.6* 29.8*  MCV 95.5  --  93.1  --   --   --  94.1 94.6 94.3  PLT 169  --  174  --   --   --  206 148* 144*   < > = values in this interval not displayed.    Basic Metabolic Panel: Recent Labs  Lab 02/29/24 1053 03/04/24 0822 03/04/24 1157 03/04/24 1256 03/04/24 1632 03/04/24 1634 03/04/24 1748 03/04/24 1927 03/05/24 0413 03/05/24 1538 03/06/24 0430  NA 136   < > 134*   < > 136   < > 140 139 137 134* 134*  K 5.1   < > 5.2*   < > 4.2   < > 3.5 3.4* 4.4 4.0 4.0  CL 102   < > 105  --  107  --   --   --  107 102 102  CO2 27  --   --   --  23  --   --   --  24 26 25   GLUCOSE 117*   < > 120*  --  128*  --   --   --  115* 123* 131*  BUN 16   < > 13  --  11  --   --   --  9 9 10   CREATININE 1.31*   < > 0.90  --  0.95  --   --   --  0.89 0.98 0.97  CALCIUM 9.6  --   --   --  8.1*  --   --   --  7.9* 8.2* 8.5*  MG  --   --   --   --  2.8*  --   --   --  2.3 2.3  --    < > = values in this interval not displayed.   GFR: Estimated Creatinine Clearance: 98.6 mL/min (by C-G formula based on SCr of 0.97 mg/dL). Recent Labs  Lab 03/04/24 1632 03/05/24 0413 03/05/24 1538 03/06/24 0430  WBC 14.7* 16.0* 12.8* 12.8*    Liver Function Tests: Recent Labs  Lab 02/29/24 1053  AST 36  ALT 37  ALKPHOS 40  BILITOT 0.9  PROT 7.4  ALBUMIN 4.3    Critical care time:        Steffanie Dunn, DO 03/06/24 7:32 AM [facility]  For contact information, see Amion. If no response to pager, please call PCCM consult pager. After hours, 7PM- 7AM, please call Elink.

## 2024-03-06 NOTE — Progress Notes (Addendum)
 TCTS DAILY ICU PROGRESS NOTE                   [address]            [place] [address]          [phone]   2 Days Post-Op Procedure(s) (LRB): CORONARY ARTERY BYPASS GRAFTING (CABG) TIMES THREE UTILIZING LEFT INTERNAL MAMMARY ARTERY AND ENDOSCOPIC VEIN HARVEST RIGHT GREATER SAPHENOUS VEIN (N/A) ECHOCARDIOGRAM, TRANSESOPHAGEAL (N/A)  Total Length of Stay:  LOS: 2 days   Subjective: Feels pretty well, up in chair  Objective: Vital signs in last 24 hours: Temp:  [98.2 F (36.8 C)-98.6 F (37 C)] 98.5 F (36.9 C) (03/26 0400) Pulse Rate:  [73-101] 87 (03/26 0500) Cardiac Rhythm: Normal sinus rhythm (03/26 0400) Resp:  [12-29] 22 (03/26 0500) BP: (98-124)/(57-74) 114/68 (03/26 0500) SpO2:  [86 %-95 %] 93 % (03/26 0500) Arterial Line BP: (93-174)/(37-65) 95/37 (03/26 0200) Weight:  [106 kg] 106 kg (03/26 0500)  Filed Weights   03/04/24 0550 03/05/24 0500 03/06/24 0500  Weight: 101.6 kg 105.5 kg 106 kg    Weight change: 0.48 kg   Hemodynamic parameters for last 24 hours: CVP:  [0 mmHg-17 mmHg] 0 mmHg CO:  [6.5 L/min-9.1 L/min] 8.6 L/min CI:  [2.8 L/min/m2-4 L/min/m2] 3.7 L/min/m2  Intake/Output from previous day: 03/25 0701 - 03/26 0700 In: 778.1 [I.V.:121.7; IV Piggyback:656.5] Out: 1100 [Urine:870; Chest Tube:230]  Intake/Output this shift: No intake/output data recorded.  Current Meds: Scheduled Meds:  acetaminophen  1,000 mg Oral Q6H   Or   acetaminophen (TYLENOL) oral liquid 160 mg/5 mL  1,000 mg Per Tube Q6H   aspirin EC  325 mg Oral Daily   Or   aspirin  324 mg Per Tube Daily   atorvastatin  80 mg Oral Daily   bisacodyl  10 mg Oral Daily   Or   bisacodyl  10 mg Rectal Daily   Chlorhexidine Gluconate Cloth  6 each Topical Daily   docusate sodium  200 mg Oral Daily   enoxaparin (LOVENOX) injection  40 mg Subcutaneous QHS   insulin aspart  0-24 Units Subcutaneous Q4H   metoprolol tartrate  12.5 mg Oral BID   Or   metoprolol  tartrate  12.5 mg Per Tube BID   nicotine  14 mg Transdermal Daily   pantoprazole  40 mg Oral Daily   sodium chloride flush  3 mL Intravenous Q12H   sodium chloride flush  3-10 mL Intravenous Q12H   tamsulosin  0.4 mg Oral Daily   Continuous Infusions:   ceFAZolin (ANCEF) IV Stopped (03/05/24 2248)   piperacillin-tazobactam (ZOSYN)  IV 3.375 g (03/06/24 0647)   PRN Meds:.ipratropium-albuterol, metoprolol tartrate, morphine injection, ondansetron (ZOFRAN) IV, mouth rinse, oxyCODONE, sodium chloride flush, sodium chloride flush, traMADol  General appearance: alert, cooperative, and no distress Heart: regular rate and rhythm Lungs: dim in bases Abdomen: + BS, non tender Extremities: no edema Wound: evh sit ok, some echymosis, sternal dressing in place  Lab Results: CBC: Recent Labs    03/05/24 1538 03/06/24 0430  WBC 12.8* 12.8*  HGB 9.9* 9.9*  HCT 29.6* 29.8*  PLT 148* 144*   BMET:  Recent Labs    03/05/24 1538 03/06/24 0430  NA 134* 134*  K 4.0 4.0  CL 102 102  CO2 26 25  GLUCOSE 123* 131*  BUN 9 10  CREATININE 0.98 0.97  CALCIUM 8.2* 8.5*    CMET: Lab Results  Component Value Date  WBC 12.8 (H) 03/06/2024   HGB 9.9 (L) 03/06/2024   HCT 29.8 (L) 03/06/2024   PLT 144 (L) 03/06/2024   GLUCOSE 131 (H) 03/06/2024   CHOL 94 03/06/2024   TRIG 112 03/06/2024   HDL 22 (L) 03/06/2024   LDLCALC 50 03/06/2024   ALT 37 02/29/2024   AST 36 02/29/2024   NA 134 (L) 03/06/2024   K 4.0 03/06/2024   CL 102 03/06/2024   CREATININE 0.97 03/06/2024   BUN 10 03/06/2024   CO2 25 03/06/2024   INR 1.3 (H) 03/04/2024   HGBA1C 5.8 (H) 02/29/2024      PT/INR:  Recent Labs    03/04/24 1253  LABPROT 16.2*  INR 1.3*   Radiology: No results found.   Assessment/Plan: S/P Procedure(s) (LRB): CORONARY ARTERY BYPASS GRAFTING (CABG) TIMES THREE UTILIZING LEFT INTERNAL MAMMARY ARTERY AND ENDOSCOPIC VEIN HARVEST RIGHT GREATER SAPHENOUS VEIN (N/A) ECHOCARDIOGRAM,  TRANSESOPHAGEAL (N/A) POD#2  1 afeb, VSS, good hemodynamics- no gtts , sinus rhythm 2 O2 sats ok on 5 liters- getting nebs, zosyn per PCCM- left lower lobe infilts/atx on  CXR  - fairly stable in appearance, no current cough or sputum production- stopping abx per Dr Cliffton Asters 3 weight 6 kg >preop, fair UOP, will need some diuresis, flomax started for retention- foley replaced- leave for now 4 normal renal fxn 5 leukocytos is stable w/ WBC 12.8 6 minor thrombocytopenia 7 expected ABLA stable HJ/H 8 BS good control, pre- diabetic - routine protocols 9 routine rehab modalities, poss tx  telemetry  today 10 lovenox for DVT ppx   Rowe Clack PA-C Pager [phone] 03/06/2024 7:18 AM  Agree with above Will transfer to floor Foley back in, on flomax Will stop abx  Anissa Abbs O Christoper Bushey

## 2024-03-06 NOTE — Progress Notes (Signed)
 Patient arrived at the unit from 2H,chg bath given,old drainage from chest removal site,vitals taken,rt internal jugular present,pt is in [place] 5l,pt oriented to the unit,call bell in reach

## 2024-03-07 ENCOUNTER — Inpatient Hospital Stay (HOSPITAL_COMMUNITY)

## 2024-03-07 LAB — GLUCOSE, CAPILLARY
Glucose-Capillary: 111 mg/dL — ABNORMAL HIGH (ref 70–99)
Glucose-Capillary: 112 mg/dL — ABNORMAL HIGH (ref 70–99)
Glucose-Capillary: 121 mg/dL — ABNORMAL HIGH (ref 70–99)

## 2024-03-07 LAB — CULTURE, RESPIRATORY W GRAM STAIN

## 2024-03-07 LAB — BASIC METABOLIC PANEL WITH GFR
Anion gap: 8 (ref 5–15)
BUN: 9 mg/dL (ref 8–23)
CO2: 26 mmol/L (ref 22–32)
Calcium: 8.5 mg/dL — ABNORMAL LOW (ref 8.9–10.3)
Chloride: 102 mmol/L (ref 98–111)
Creatinine, Ser: 1.02 mg/dL (ref 0.61–1.24)
GFR, Estimated: >60 mL/min (ref >60)
Glucose, Bld: 111 mg/dL — ABNORMAL HIGH (ref 70–99)
Potassium: 3.9 mmol/L (ref 3.5–5.1)
Sodium: 136 mmol/L (ref 135–145)

## 2024-03-07 LAB — CBC
HCT: 30.2 % — ABNORMAL LOW (ref 39.0–52.0)
Hemoglobin: 10.1 g/dL — ABNORMAL LOW (ref 13.0–17.0)
MCH: 31.2 pg (ref 26.0–34.0)
MCHC: 33.4 g/dL (ref 30.0–36.0)
MCV: 93.2 fL (ref 80.0–100.0)
Platelets: 162 10*3/uL (ref 150–400)
RBC: 3.24 MIL/uL — ABNORMAL LOW (ref 4.22–5.81)
RDW: 13.1 % (ref 11.5–15.5)
WBC: 11.8 10*3/uL — ABNORMAL HIGH (ref 4.0–10.5)
nRBC: 0 % (ref 0.0–0.2)

## 2024-03-07 MED ORDER — METOPROLOL TARTRATE 25 MG/10 ML ORAL SUSPENSION
25.0000 mg | Freq: Two times a day (BID) | ORAL | Status: DC
  Filled 2024-03-07 (×5): qty 10

## 2024-03-07 MED ORDER — AMIODARONE IV BOLUS ONLY 150 MG/100ML
150.0000 mg | Freq: Once | INTRAVENOUS | Status: AC
  Administered 2024-03-07: 150 mg via INTRAVENOUS
  Filled 2024-03-07: qty 100

## 2024-03-07 MED ORDER — METOPROLOL TARTRATE 5 MG/5ML IV SOLN
5.0000 mg | Freq: Once | INTRAVENOUS | Status: AC
  Administered 2024-03-07: 5 mg via INTRAVENOUS

## 2024-03-07 MED ORDER — METOPROLOL TARTRATE 5 MG/5ML IV SOLN
INTRAVENOUS | Status: AC
Start: 2024-03-07 — End: 2024-03-07
  Filled 2024-03-07: qty 5

## 2024-03-07 MED ORDER — METOPROLOL TARTRATE 5 MG/5ML IV SOLN
5.0000 mg | Freq: Once | INTRAVENOUS | Status: AC
  Administered 2024-03-07: 5 mg via INTRAVENOUS
  Filled 2024-03-07: qty 5

## 2024-03-07 MED ORDER — AMIODARONE LOAD VIA INFUSION
150.0000 mg | Freq: Once | INTRAVENOUS | Status: AC
  Administered 2024-03-07: 150 mg via INTRAVENOUS
  Filled 2024-03-07: qty 83.34

## 2024-03-07 MED ORDER — AMIODARONE HCL IN DEXTROSE 360-4.14 MG/200ML-% IV SOLN
30.0000 mg/h | INTRAVENOUS | Status: DC
  Administered 2024-03-07 (×2): 30 mg/h via INTRAVENOUS
  Filled 2024-03-07 (×2): qty 200

## 2024-03-07 MED ORDER — METOPROLOL TARTRATE 5 MG/5ML IV SOLN: 5.0000 mg | INTRAVENOUS | Status: DC

## 2024-03-07 MED ORDER — METOPROLOL TARTRATE 25 MG PO TABS
25.0000 mg | ORAL_TABLET | Freq: Two times a day (BID) | ORAL | Status: DC
  Administered 2024-03-07 – 2024-03-08 (×4): 25 mg via ORAL
  Filled 2024-03-07 (×4): qty 1

## 2024-03-07 MED ORDER — AMIODARONE HCL IN DEXTROSE 360-4.14 MG/200ML-% IV SOLN
INTRAVENOUS | Status: AC
  Filled 2024-03-07: qty 400

## 2024-03-07 MED ORDER — POTASSIUM CHLORIDE CRYS ER 20 MEQ PO TBCR
40.0000 meq | EXTENDED_RELEASE_TABLET | Freq: Every day | ORAL | Status: DC
  Administered 2024-03-07 – 2024-03-08 (×2): 40 meq via ORAL
  Filled 2024-03-07 (×2): qty 2

## 2024-03-07 MED ORDER — AMIODARONE HCL IN DEXTROSE 360-4.14 MG/200ML-% IV SOLN
60.0000 mg/h | INTRAVENOUS | Status: AC
  Administered 2024-03-07 (×2): 60 mg/h via INTRAVENOUS
  Filled 2024-03-07: qty 200

## 2024-03-07 NOTE — Progress Notes (Signed)
      [address]       [name] [address]             [phone]        Patient remains in SVT rates in the 140s.  He has not yet responded to Amiodarone bolus, drip, or Lopressor as ordered.  We will repeat Amiodarone bolus.  If patient does not respond will get Cardiology consult.   Patient remains stable and is tolerating without difficulty.  Laroy Mustard, PA-C 1:20 PM 03/07/24

## 2024-03-07 NOTE — Progress Notes (Signed)
 Nurse advised to come back after amio infusion to ambulate. Will f/u when able.   Faustino Congress MS, ACSM-CEP  03/07/2024 9:15 AM

## 2024-03-07 NOTE — Plan of Care (Signed)
 Alert and oriented. Amiodarone gtt started after no change in heart rate after IV push of metoprolol. Converted to NSR after additional bolus dose. Able to tolerate getting OOB to chair for dinner meal.   Problem: Education: Goal: Knowledge of General Education information will improve Description: Including pain rating scale, medication(s)/side effects and non-pharmacologic comfort measures Outcome: Progressing   Problem: Health Behavior/Discharge Planning: Goal: Ability to manage health-related needs will improve Outcome: Progressing   Problem: Clinical Measurements: Goal: Will remain free from infection Outcome: Progressing Goal: Diagnostic test results will improve Outcome: Progressing Goal: Respiratory complications will improve Outcome: Progressing Goal: Cardiovascular complication will be avoided Outcome: Progressing   Problem: Activity: Goal: Risk for activity intolerance will decrease Outcome: Progressing   Problem: Clinical Measurements: Goal: Will remain free from infection Outcome: Progressing

## 2024-03-07 NOTE — Progress Notes (Addendum)
      [address]       [place] [address]             [phone]      3 Days Post-Op Procedure(s) (LRB): CORONARY ARTERY BYPASS GRAFTING (CABG) TIMES THREE UTILIZING LEFT INTERNAL MAMMARY ARTERY AND ENDOSCOPIC VEIN HARVEST RIGHT GREATER SAPHENOUS VEIN (N/A) ECHOCARDIOGRAM, TRANSESOPHAGEAL (N/A)  Subjective:  Patient doing okay.  States he had a bad coughing spell and his heart rate has been elevated since this time.  He has not yet moved his bowels, but is passing gas.  Objective: Vital signs in last 24 hours: Temp:  [98 F (36.7 C)-99.2 F (37.3 C)] 98 F (36.7 C) (03/27 0607) Pulse Rate:  [86-154] 146 (03/27 0607) Cardiac Rhythm: Sinus tachycardia (03/27 0604) Resp:  [12-24] 18 (03/27 0607) BP: (108-131)/(58-90) 128/90 (03/27 0607) SpO2:  [89 %-97 %] 97 % (03/27 0607) Weight:  [103.3 kg] 103.3 kg (03/27 0442)  Intake/Output from previous day: 03/26 0701 - 03/27 0700 In: 407.6 [P.O.:360; IV Piggyback:47.6] Out: 4220 [Urine:4200; Chest Tube:20]  General appearance: alert, cooperative, and no distress Heart: regular rate and rhythm Lungs: tachy Abdomen: soft, non-tender; bowel sounds normal; no masses,  no organomegaly Extremities: edema trace Wound: clean and dry  Lab Results: Recent Labs    03/06/24 0430 03/07/24 0400  WBC 12.8* 11.8*  HGB 9.9* 10.1*  HCT 29.8* 30.2*  PLT 144* 162   BMET:  Recent Labs    03/06/24 0430 03/07/24 0400  NA 134* 136  K 4.0 3.9  CL 102 102  CO2 25 26  GLUCOSE 131* 111*  BUN 10 9  CREATININE 0.97 1.02  CALCIUM 8.5* 8.5*    PT/INR:  Recent Labs    03/04/24 1253  LABPROT 16.2*  INR 1.3*   ABG    Component Value Date/Time   PHART 7.324 (L) 03/04/2024 1927   HCO3 21.1 03/04/2024 1927   TCO2 22 03/04/2024 1927   ACIDBASEDEF 4.0 (H) 03/04/2024 1927   O2SAT 95 03/04/2024 1927   CBG (last 3)  Recent Labs    03/06/24 1619 03/06/24 1928 03/07/24 0554  GLUCAP 113* 136* 111*     Assessment/Plan: S/P Procedure(s) (LRB): CORONARY ARTERY BYPASS GRAFTING (CABG) TIMES THREE UTILIZING LEFT INTERNAL MAMMARY ARTERY AND ENDOSCOPIC VEIN HARVEST RIGHT GREATER SAPHENOUS VEIN (N/A) ECHOCARDIOGRAM, TRANSESOPHAGEAL (N/A)  CV- Sinus Tach/SVT- rates in the 150s- repeat IV lopressor, increase Lopressor to 25 mg BID.. ? Antiarrythmic? Will discuss with Nohelani Benning Pulm- underlying COPD, wean oxygen as tolerated currently at 3L via [place].. will get walk test Renal-creatinine stable, continue lasix, potassium ID- leukocytosis improving. No evidence of infection Deconditioning- H/H per adoration  Dispo- patients biggest issue is HR.Marland Kitchen increase Lopressor, will discuss need for antiarrhythmic, continue current care  Discussed with Dr. Cliffton Asters, will start oral Amiodarone drip   LOS: 3 days    Lowella Dandy, PA-C 03/07/2024   Agree with above SVT, starting amio Titrating BB  Tremar Wickens O Dauna Ziska

## 2024-03-08 LAB — BASIC METABOLIC PANEL WITH GFR
Anion gap: 9 (ref 5–15)
BUN: 13 mg/dL (ref 8–23)
CO2: 28 mmol/L (ref 22–32)
Calcium: 8.7 mg/dL — ABNORMAL LOW (ref 8.9–10.3)
Chloride: 98 mmol/L (ref 98–111)
Creatinine, Ser: 1.08 mg/dL (ref 0.61–1.24)
GFR, Estimated: >60 mL/min (ref >60)
Glucose, Bld: 123 mg/dL — ABNORMAL HIGH (ref 70–99)
Potassium: 4 mmol/L (ref 3.5–5.1)
Sodium: 135 mmol/L (ref 135–145)

## 2024-03-08 LAB — GLUCOSE, CAPILLARY: Glucose-Capillary: 113 mg/dL — ABNORMAL HIGH (ref 70–99)

## 2024-03-08 MED ORDER — FUROSEMIDE 10 MG/ML IJ SOLN
40.0000 mg | Freq: Once | INTRAMUSCULAR | Status: AC
  Administered 2024-03-08: 40 mg via INTRAVENOUS
  Filled 2024-03-08: qty 4

## 2024-03-08 MED ORDER — AMIODARONE HCL 200 MG PO TABS
200.0000 mg | ORAL_TABLET | Freq: Two times a day (BID) | ORAL | Status: DC
  Administered 2024-03-08 – 2024-03-09 (×3): 200 mg via ORAL
  Filled 2024-03-08 (×3): qty 1

## 2024-03-08 NOTE — Progress Notes (Signed)
 Right internal jugular removed as per order,vitals checked before and after pulling out the line,pressure applied for 15 min,advice patient to be in bed rest for 30 min,pt tolerated the procedure well

## 2024-03-08 NOTE — Progress Notes (Signed)
 Patient has excellent vasculature with tourniquet application. Please assess and attempt prior to placing IVT consult.   Hiromi Knodel Loyola Mast, RN

## 2024-03-08 NOTE — Evaluation (Signed)
 Physical Therapy Evaluation Patient Details Name: Dave Williams MRN: [id number] DOB: 27-Sep-1956 Today's Date: 03/08/2024  History of Present Illness  Pt is 68 year old presented to [facility] on  03/04/24 for CABG x 3.. PMH - back surgery, CAD  Clinical Impression  Pt moving well to EOB and to initiate ambulation. Sat EOB 5' prior to amb and stood ~ 30 seconds. Reported he was dizzy/lightheaded after 5'. Had him sit down on rollator and rolled him back to side of bed. Pt reported worsening symptoms and before I could assist him back to bed he became unresponsive. Called for help. Nursing in. Pt began coming around and able to transfer him back to bed with 3 person assist. Will not make a dc recommendation at this time due to medical event. Pt moving well prior to event and likely will do well if no significant medical issues. Will make recommendation after next visit.        If plan is discharge home, recommend the following:     Can travel by private vehicle        Equipment Recommendations Rolling walker (2 wheels);Rollator (4 wheels) (RW vs rollator)  Recommendations for Other Services       Functional Status Assessment       Precautions / Restrictions Precautions Precautions: Fall;Other (comment) Recall of Precautions/Restrictions: Intact Precaution/Restrictions Comments: Passed out on eval. Check orthostatics Restrictions Other Position/Activity Restrictions: sternal precautions      Mobility  Bed Mobility Overal bed mobility: Needs Assistance Bed Mobility: Rolling, Sidelying to Sit Rolling: Contact guard assist Sidelying to sit: Contact guard assist       General bed mobility comments: Verbal cues for technique    Transfers Overall transfer level: Needs assistance Equipment used: Rollator (4 wheels) Transfers: Sit to/from Stand Sit to Stand: Contact guard assist                Ambulation/Gait Ambulation/Gait assistance: Contact guard assist Gait Distance  (Feet): 5 Feet Assistive device: Rollator (4 wheels) Gait Pattern/deviations: Step-through pattern, Decreased stride length Gait velocity: decr Gait velocity interpretation: 1.31 - 2.62 ft/sec, indicative of limited community ambulator   General Gait Details: Assist for safety. Pt became lightheaded and had him sit on rollator to return to bed. Became unresponsive sitting on rollator.  Stairs            Wheelchair Mobility     Tilt Bed    Modified Rankin (Stroke Patients Only)       Balance Overall balance assessment: Mild deficits observed, not formally tested                                           Pertinent Vitals/Pain Pain Assessment Pain Assessment: No/denies pain    Home Living Family/patient expects to be discharged to:: Private residence Living Arrangements: Alone Available Help at Discharge: Friend(s) Type of Home: House Home Access: Ramped entrance       Home Layout: One level Home Equipment: None      Prior Function Prior Level of Function : Independent/Modified Independent;Driving;Working/employed             Mobility Comments: No assistive device. Volunteer firefighter/paramedic       Extremity/Trunk Assessment   Upper Extremity Assessment Upper Extremity Assessment: Defer to OT evaluation    Lower Extremity Assessment Lower Extremity Assessment: Generalized weakness       Communication  Communication Communication: No apparent difficulties    Cognition Arousal: Alert Behavior During Therapy: WFL for tasks assessed/performed   PT - Cognitive impairments: No apparent impairments                         Following commands: Intact       Cueing Cueing Techniques: Verbal cues     General Comments General comments (skin integrity, edema, etc.): Pt became lightheaded with amb, Sat on rollator, became unresponsive and +3 transfer back to bed as he became more responsive    Exercises      Assessment/Plan    PT Assessment Patient needs continued PT services  PT Problem List Decreased strength;Decreased activity tolerance;Decreased mobility;Decreased knowledge of precautions;Cardiopulmonary status limiting activity       PT Treatment Interventions DME instruction;Gait training;Functional mobility training;Therapeutic activities;Therapeutic exercise;Balance training;Patient/family education    PT Goals (Current goals can be found in the Care Plan section)  Acute Rehab PT Goals PT Goal Formulation: With patient Time For Goal Achievement: 03/22/24 Potential to Achieve Goals: Good    Frequency Min 2X/week     Co-evaluation               AM-PAC PT "6 Clicks" Mobility  Outcome Measure Help needed turning from your back to your side while in a flat bed without using bedrails?: A Little Help needed moving from lying on your back to sitting on the side of a flat bed without using bedrails?: A Little Help needed moving to and from a bed to a chair (including a wheelchair)?: A Little Help needed standing up from a chair using your arms (e.g., wheelchair or bedside chair)?: A Little Help needed to walk in hospital room?: Total (due to unresponsive episode) Help needed climbing 3-5 steps with a railing? : Total 6 Click Score: 14    End of Session   Activity Tolerance: Treatment limited secondary to medical complications (Comment) Patient left: in bed;with call bell/phone within reach;with nursing/sitter in room Nurse Communication: Mobility status;Other (comment) (Sequence of events to becoming unresponsive) PT Visit Diagnosis: Other abnormalities of gait and mobility (R26.89);Muscle weakness (generalized) (M62.81)    Time: 1610-9604 PT Time Calculation (min) (ACUTE ONLY): 26 min   Charges:   PT Evaluation $PT Eval Moderate Complexity: 1 Mod PT Treatments $Gait Training: 8-22 mins PT General Charges $$ ACUTE PT VISIT: 1 Visit         [facility]   PT Acute Rehabilitation Services Office [phone]   Angelina Ok [facility] 03/08/2024, 12:51 PM

## 2024-03-08 NOTE — Progress Notes (Addendum)
 CARDIAC REHAB PHASE I   PRE:  Rate/Rhythm: 89 SR    BP: sitting 115/75    SpO2: 96 2L, 92 RA  MODE:  Ambulation: 250 ft   POST:  Rate/Rhythm: 101 ST    BP: sitting 130/72     SpO2: 92 RA  Pt up in recliner. Able to move hips forward in recliner with verbal cues. Stood with rocking and standby assist. Ambulated with RW, slow and steady, tolerated well, no major c/o. Pt able to ambulate without O2, 95 RA in hall. To bed after walk, SPO2 89-92 RA lying down. Left pt with [place] in case monitor starts beeping while asleep.  Discussed with pt IS (currently 1200 ml), sternal precautions, smoking cessation, exercise, diet, and CRPII. Gave [facility] booklet. Pt very receptive. He sts he quit smoking day of surgery. Will refer to [facility] CRPII. He sts he will discuss with his friend today staying with him at d/c for 2 weeks. Encouraged x2 more walks today. He will need RW for d/c. [phone]  Ethelda Chick BS, ACSM-CEP 03/08/2024 9:43 AM

## 2024-03-08 NOTE — Progress Notes (Signed)
 PT called for help as pt had light headed and dizzy when he walked ,upon arrival pt was in a chair,sweating,not following commands,tried to put patient in bed but eventually pt regain his consciousness was able to stand and pivot to bed,vitals and blood sugar checked,pt state he is starting to fell better,PA Erin notified

## 2024-03-08 NOTE — Progress Notes (Addendum)
      [address]       [place] [address]             [phone]      4 Days Post-Op Procedure(s) (LRB): CORONARY ARTERY BYPASS GRAFTING (CABG) TIMES THREE UTILIZING LEFT INTERNAL MAMMARY ARTERY AND ENDOSCOPIC VEIN HARVEST RIGHT GREATER SAPHENOUS VEIN (N/A) ECHOCARDIOGRAM, TRANSESOPHAGEAL (N/A)  Subjective:  Patient sitting up in chair.  Overall states doing well.  Did not get to ambulate yesterday due to elevated HR.  He has not yet moved his bowels, but is passing a lot of gas  Objective: Vital signs in last 24 hours: Temp:  [98 F (36.7 C)-98.2 F (36.8 C)] 98 F (36.7 C) (03/28 0435) Pulse Rate:  [74-150] 74 (03/28 0435) Cardiac Rhythm: Normal sinus rhythm (03/27 2000) Resp:  [14-24] 15 (03/28 0435) BP: (87-144)/(62-96) 113/82 (03/28 0435) SpO2:  [91 %-97 %] 97 % (03/28 0435) Weight:  [106.1 kg] 106.1 kg (03/28 0435)  Intake/Output from previous day: 03/27 0701 - 03/28 0700 In: 507.8 [I.V.:507.8] Out: 2160 [Urine:2160]  General appearance: alert, cooperative, and no distress Heart: regular rate and rhythm Lungs: clear to auscultation bilaterally Abdomen: soft, non-tender; bowel sounds normal; no masses,  no organomegaly Extremities: edema trace Wound: clean and dry  Lab Results: Recent Labs    03/06/24 0430 03/07/24 0400  WBC 12.8* 11.8*  HGB 9.9* 10.1*  HCT 29.8* 30.2*  PLT 144* 162   BMET:  Recent Labs    03/07/24 0400 03/08/24 0328  NA 136 135  K 3.9 4.0  CL 102 98  CO2 26 28  GLUCOSE 111* 123*  BUN 9 13  CREATININE 1.02 1.08  CALCIUM 8.5* 8.7*    PT/INR: No results for input(s): "LABPROT", "INR" in the last 72 hours. ABG    Component Value Date/Time   PHART 7.324 (L) 03/04/2024 1927   HCO3 21.1 03/04/2024 1927   TCO2 22 03/04/2024 1927   ACIDBASEDEF 4.0 (H) 03/04/2024 1927   O2SAT 95 03/04/2024 1927   CBG (last 3)  Recent Labs    03/07/24 0554 03/07/24 1202 03/07/24 1631  GLUCAP 111* 112* 121*     Assessment/Plan: S/P Procedure(s) (LRB): CORONARY ARTERY BYPASS GRAFTING (CABG) TIMES THREE UTILIZING LEFT INTERNAL MAMMARY ARTERY AND ENDOSCOPIC VEIN HARVEST RIGHT GREATER SAPHENOUS VEIN (N/A) ECHOCARDIOGRAM, TRANSESOPHAGEAL (N/A)  CV- SVT.Marland Kitchen now maintaining NSR with rates in the 70s- continue Lopressor, will stop IV Amiodarone, start oral at 200 mg BID Pulm- wean oxygen as tolerated, severe underlying COPD Renal- creatinine remains stable, weight remains elevated today.. will give IV lasix, potassium Deconditioning- patient lives alone, has no help at discharge.. states he is suppose to go to SNF.... will get PT/OT consults.Marland Kitchen discuss with TOC   LOS: 4 days    Lowella Dandy, PA-C 03/08/2024  Agree with above Dispo planning  Jenaye Rickert O Alyha Marines

## 2024-03-08 NOTE — Plan of Care (Signed)

## 2024-03-08 NOTE — Care Management Important Message (Signed)
 Important Message  Patient Details  Name: Dave Williams MRN: [id number] Date of Birth: 1956/11/20   Important Message Given:  Yes - Medicare IM     Dorena Bodo 03/08/2024, 2:46 PM

## 2024-03-09 LAB — BASIC METABOLIC PANEL WITH GFR
Anion gap: 9 (ref 5–15)
BUN: 14 mg/dL (ref 8–23)
CO2: 28 mmol/L (ref 22–32)
Calcium: 9.1 mg/dL (ref 8.9–10.3)
Chloride: 102 mmol/L (ref 98–111)
Creatinine, Ser: 1.11 mg/dL (ref 0.61–1.24)
GFR, Estimated: >60 mL/min (ref >60)
Glucose, Bld: 114 mg/dL — ABNORMAL HIGH (ref 70–99)
Potassium: 4.1 mmol/L (ref 3.5–5.1)
Sodium: 139 mmol/L (ref 135–145)

## 2024-03-09 MED ORDER — MAGNESIUM OXIDE -MG SUPPLEMENT 400 (240 MG) MG PO TABS
400.0000 mg | ORAL_TABLET | Freq: Two times a day (BID) | ORAL | Status: DC
  Administered 2024-03-09: 400 mg via ORAL
  Filled 2024-03-09: qty 1

## 2024-03-09 MED ORDER — AMIODARONE HCL IN DEXTROSE 360-4.14 MG/200ML-% IV SOLN
30.0000 mg/h | INTRAVENOUS | Status: DC
  Administered 2024-03-10: 30 mg/h via INTRAVENOUS
  Filled 2024-03-09: qty 200

## 2024-03-09 MED ORDER — METOPROLOL TARTRATE 25 MG/10 ML ORAL SUSPENSION
12.5000 mg | Freq: Two times a day (BID) | ORAL | Status: DC
  Filled 2024-03-09: qty 5

## 2024-03-09 MED ORDER — AMIODARONE IV BOLUS ONLY 150 MG/100ML
150.0000 mg | Freq: Once | INTRAVENOUS | Status: AC
  Administered 2024-03-09: 150 mg via INTRAVENOUS

## 2024-03-09 MED ORDER — AMIODARONE IV BOLUS ONLY 150 MG/100ML: 150.0000 mg | Freq: Once | INTRAVENOUS | Status: DC

## 2024-03-09 MED ORDER — FUROSEMIDE 40 MG PO TABS
40.0000 mg | ORAL_TABLET | Freq: Every day | ORAL | Status: DC
  Administered 2024-03-09: 40 mg via ORAL
  Filled 2024-03-09: qty 1

## 2024-03-09 MED ORDER — AMIODARONE HCL IN DEXTROSE 360-4.14 MG/200ML-% IV SOLN
60.0000 mg/h | INTRAVENOUS | Status: AC
  Administered 2024-03-09: 60 mg/h via INTRAVENOUS
  Filled 2024-03-09: qty 200

## 2024-03-09 MED ORDER — POTASSIUM CHLORIDE CRYS ER 20 MEQ PO TBCR
20.0000 meq | EXTENDED_RELEASE_TABLET | Freq: Every day | ORAL | Status: DC
  Administered 2024-03-09: 20 meq via ORAL
  Filled 2024-03-09: qty 1

## 2024-03-09 MED ORDER — AMIODARONE HCL 200 MG PO TABS: 400.0000 mg | ORAL_TABLET | Freq: Two times a day (BID) | ORAL | Status: DC

## 2024-03-09 MED ORDER — AMIODARONE IV BOLUS ONLY 150 MG/100ML
INTRAVENOUS | Status: AC
  Filled 2024-03-09: qty 100

## 2024-03-09 MED ORDER — LACTULOSE 10 GM/15ML PO SOLN: 20.0000 g | Freq: Every day | ORAL | Status: DC | PRN

## 2024-03-09 MED ORDER — METOPROLOL TARTRATE 12.5 MG HALF TABLET
12.5000 mg | ORAL_TABLET | Freq: Two times a day (BID) | ORAL | Status: DC
  Administered 2024-03-09 (×2): 12.5 mg via ORAL
  Filled 2024-03-09 (×3): qty 1

## 2024-03-09 NOTE — Progress Notes (Addendum)
      [address]       [place] [address]             [phone]      5 Days Post-Op Procedure(s) (LRB): CORONARY ARTERY BYPASS GRAFTING (CABG) TIMES THREE UTILIZING LEFT INTERNAL MAMMARY ARTERY AND ENDOSCOPIC VEIN HARVEST RIGHT GREATER SAPHENOUS VEIN (N/A) ECHOCARDIOGRAM, TRANSESOPHAGEAL (N/A)  Subjective:  Patient doing okay.  He is very nervous about his syncopal episode.    Objective: Vital signs in last 24 hours: Temp:  [97.8 F (36.6 C)-98.6 F (37 C)] 97.8 F (36.6 C) (03/29 0827) Pulse Rate:  [73-97] 85 (03/29 0827) Cardiac Rhythm: Normal sinus rhythm (03/29 0700) Resp:  [10-22] 19 (03/29 0827) BP: (82-158)/(42-99) 117/76 (03/29 0827) SpO2:  [90 %-100 %] 94 % (03/29 0827) Weight:  [130 kg] 106 kg (03/29 0500)  Intake/Output from previous day: 03/28 0701 - 03/29 0700 In: 1200 [P.O.:1200] Out: 2025 [Urine:2025]  General appearance: alert, cooperative, and no distress Heart: regular rate and rhythm Lungs: clear to auscultation bilaterally Abdomen: soft, non-tender; bowel sounds normal; no masses,  no organomegaly Extremities: edema trace Wound: clean and dry  Lab Results: Recent Labs    03/07/24 0400  WBC 11.8*  HGB 10.1*  HCT 30.2*  PLT 162   BMET:  Recent Labs    03/08/24 0328 03/09/24 0351  NA 135 139  K 4.0 4.1  CL 98 102  CO2 28 28  GLUCOSE 123* 114*  BUN 13 14  CREATININE 1.08 1.11  CALCIUM 8.7* 9.1    PT/INR: No results for input(s): "LABPROT", "INR" in the last 72 hours. ABG    Component Value Date/Time   PHART 7.324 (L) 03/04/2024 1927   HCO3 21.1 03/04/2024 1927   TCO2 22 03/04/2024 1927   ACIDBASEDEF 4.0 (H) 03/04/2024 1927   O2SAT 95 03/04/2024 1927   CBG (last 3)  Recent Labs    03/07/24 1202 03/07/24 1631 03/08/24 1203  GLUCAP 112* 121* 113*    Assessment/Plan: S/P Procedure(s) (LRB): CORONARY ARTERY BYPASS GRAFTING (CABG) TIMES THREE UTILIZING LEFT INTERNAL MAMMARY ARTERY AND ENDOSCOPIC VEIN  HARVEST RIGHT GREATER SAPHENOUS VEIN (N/A) ECHOCARDIOGRAM, TRANSESOPHAGEAL (N/A)  CV- maintaining NSR, developing suspected orthostatic- will decrease Lopressor to 12.5 mg BID, Amiodarone Pulm- severe COPD, wean oxygen as tolerated, coughing up a lot of sputum.. continue Duoneb Renal- creatinine remains relatively stable...responded well to diuretics.Marland Kitchen will transition to oral Lopressor Deconditioning- awaiting PT/OT eval... patient lives alone.. hoping for CIR placement? Dispo- patient stable, suspect he has orthostatic hypotension... have ordered for this to be checked... however his has not yet been documented in the chart... decrease Lopressor to home dose of 12.5 mg BID.... await PT/OT eval...Marland Kitchen. continue diuretics, wean oxygen as tolerated   LOS: 5 days    Lowella Dandy, PA-C 03/09/2024  Agree with above Treating BP and orthostasis  Lea Baine O Neiva Maenza

## 2024-03-09 NOTE — Progress Notes (Signed)
 PT/OT were with patient, checked orthostatic vital's, HR increased to 150-160s, Blood pressure is WNL, patient is resting on the bed, patient is asymptomatic, spo2 maintained via [place] [facility], PA Erin notified and scheduled dose of metoprolol 12.5mg  given, patient is resting on the bed, continue to monitor.  Rapid response notified for second opinion.   03/09/24 1500  Vitals  BP 116/80  MAP (mmHg) 92  BP Location Right Arm  BP Method Automatic  Patient Position (if appropriate) Lying  Pulse Rate 92  Pulse Rate Source Monitor  ECG Heart Rate (!) 163  Resp 20  Level of Consciousness  Level of Consciousness Alert  MEWS COLOR  MEWS Score Color Yellow  Oxygen Therapy  SpO2 93 %  O2 Device Nasal Cannula  O2 Flow Rate (L/min) 2 L/min  MEWS Score  MEWS Temp 0  MEWS Systolic 0  MEWS Pulse 3  MEWS RR 0  MEWS LOC 0  MEWS Score 3

## 2024-03-09 NOTE — Evaluation (Signed)
 Occupational Therapy Evaluation Patient Details Name: Dave Williams MRN: [id number] DOB: [birth year] Today's Date: 03/09/2024   History of Present Illness   Pt is 68 year old presented to [facility] on  03/04/24 for CABG x 3.. PMH - back surgery, CAD     Clinical Impressions At baseline, pt is Independent with ADLs, IADLs, and functional mobility without an AD. 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 Pt now presents with impaired cardiopulmonary status, decreased activity tolerance, decreased knowledge of compensatory strategies/adaptive techniques to perform ADLs while adhering to sternal precautions, decreased balance, and decreased safety and independence with functional tasks. Pt currently demonstrates ability to complete UB ADLs Independent to Min assist, LB ADLs with Supervision to Max assist +2, bed mobility with Min assist, and functional STS transfers with a RW with Min assist +2, all with cues for techniques to adhere to sternal precautions during tasks. Pt limited by episode of orthostatic hypotension and tachycardia during session (see General Comments below for additional information). Pt O2 sat >/92% on RA throughout session. Pt will benefit from acute skilled OT services to address deficits outlined below and to increase safety and independence with functional tasks. Post acute discharge, pt will benefit from intensive inpatient skilled rehab services > 3 hours per day to maximize rehab potential.      If plan is discharge home, recommend the following:   Two people to help with walking and/or transfers;Two people to help with bathing/dressing/bathroom;Assistance with cooking/housework;Assist for transportation;Help with stairs or ramp for entrance     Functional Status Assessment   Patient has had a recent decline in their functional status and demonstrates the ability to make significant improvements in function in a reasonable and predictable amount  of time.     Equipment Recommendations   BSC/3in1 (Other TBD based on pt progress)     Recommendations for Other Services   Rehab consult     Precautions/Restrictions   Precautions Precautions: Fall;Sternal Precaution Booklet Issued: No Recall of Precautions/Restrictions: Intact Precaution/Restrictions Comments: watch orthostatics Restrictions Weight Bearing Restrictions Per Provider Order: Yes RUE Weight Bearing Per Provider Order: Non weight bearing LUE Weight Bearing Per Provider Order: Non weight bearing Other Position/Activity Restrictions: sternal precautions     Mobility Bed Mobility Overal bed mobility: Needs Assistance Bed Mobility: Rolling, Sidelying to Sit, Sit to Sidelying Rolling: Min assist Sidelying to sit: Min assist     Sit to sidelying: Min assist General bed mobility comments: pt min A for sit to sidelying for LE management and cueing for technique while adhering to sternal precautions    Transfers Overall transfer level: Needs assistance Equipment used: None Transfers: Sit to/from Stand Sit to Stand: Min assist, +2 safety/equipment, +2 physical assistance           General transfer comment: pt stood adhering to sternal precautions with hands on knees and grasped rollator handles once fully upright. However, within 30 secs of standing, pt reporting increased dizziness, cold sweats, and reporting need to sit back down.      Balance Overall balance assessment: Needs assistance Sitting-balance support: No upper extremity supported, Feet supported Sitting balance-Leahy Scale: Good     Standing balance support: Bilateral upper extremity supported, Single extremity supported, No upper extremity supported, During functional activity Standing balance-Leahy Scale: Fair Standing balance comment: pt initially stands without UE support before grasping handles of rollator. As his dizziness increases he began to sway slightly in the frontal plane  ADL either performed or assessed with clinical judgement   ADL Overall ADL's : Needs assistance/impaired Eating/Feeding: Independent;Sitting   Grooming: Supervision/safety;Cueing for compensatory techniques;Sitting (adhering to sternal precautions)   Upper Body Bathing: Contact guard assist;Minimal assistance;Cueing for compensatory techniques;Sitting (adhering to sternal precautions)   Lower Body Bathing: Supervison/ safety;Sitting/lateral leans;Maximal assistance;Sit to/from stand;+2 for physical assistance;+2 for safety/equipment;Cueing for compensatory techniques (adhering to sternal precautions)   Upper Body Dressing : Minimal assistance;Cueing for compensatory techniques;Sitting (adhering to sternal precautions)   Lower Body Dressing: Supervision/safety;Sitting/lateral leans;Maximal assistance;Sit to/from stand;+2 for physical assistance;+2 for safety/equipment;Cueing for compensatory techniques (adhering to sternal precautions)     Toilet Transfer Details (indicate cue type and reason): deferred for pt/therapist safety due to pt with episode of orthostatic hypotension Toileting- Clothing Manipulation and Hygiene: Supervision/safety;Sitting/lateral lean;Cueing for compensatory techniques       Functional mobility during ADLs:  (deferred for pt/therapist safety due to pt with episode of orthostatic hypotension) General ADL Comments: Pt with decreased activity tolerance and episode of orthostatic hypotension limiting functional level. Pt also with decreased knowledge of compensatory strategies and adaptive techniques to complete ADLs while adhering to sternal precautions.     Vision Baseline Vision/History: 1 Wears glasses Ability to See in Adequate Light: 0 Adequate (with glasses) Patient Visual Report: No change from baseline       Perception         Praxis         Pertinent Vitals/Pain Pain Assessment Pain Assessment: Faces Faces  Pain Scale: Hurts little more Pain Location: chest Pain Descriptors / Indicators: Discomfort, Grimacing, Operative site guarding Pain Intervention(s): Limited activity within patient's tolerance, Monitored during session     Extremity/Trunk Assessment Upper Extremity Assessment Upper Extremity Assessment: Right hand dominant;Overall WFL for tasks assessed (tested within sternal precautions)   Lower Extremity Assessment Lower Extremity Assessment: Defer to PT evaluation       Communication Communication Communication: No apparent difficulties   Cognition Arousal: Alert Behavior During Therapy: WFL for tasks assessed/performed Cognition: No apparent impairments             OT - Cognition Comments: AAOx4 and pleasant throughout session. Pt demonstrates good safety awareness and good insight into deficits. Pt also with good carryover of training in sternal precautions from prior session.                 Following commands: Intact       Cueing  General Comments   Cueing Techniques: Verbal cues  Pt with dizziness and lightheadedness upon standing with episode of orthostatic hypotension. PT BP in supine 130/77 (95), in sitting 104/77 (85), and after returning to supine 116/80 (92). Unable to get standing BP due to pt with symptoms and reporting need to sit. Pt HR largely in the upper-90s to low-100s throughout session but with noted sudden elevation up to 168 bpm upon returning to supine after sitting/standing. Pt O2 sat >/94% on 2L continuous O2 through nasal cannula. O2 Dr[place] removed during session with pt O2 sat remaining >/92% throughout session. Pt placed back on 2L O2 at end of session. Pt's friend present throughout session. RN present during a portion of session.   Exercises     Shoulder Instructions      Home Living Family/patient expects to be discharged to:: Private residence Living Arrangements: Alone Available Help at Discharge: Friend(s);Available  PRN/intermittently Type of Home: House Home Access: Ramped entrance     Home Layout: One level     Bathroom Shower/Tub: Tub/shower unit  Bathroom Toilet: Standard     Home Equipment: None          Prior Functioning/Environment Prior Level of Function : Independent/Modified Independent;Driving;Working/employed             Mobility Comments: Independent without an AD ADLs Comments: Independent with ADLs and IADLs, drives, and works as an Social worker    OT Problem List: Decreased activity tolerance;Impaired balance (sitting and/or standing);Decreased knowledge of use of DME or AE;Decreased knowledge of precautions;Cardiopulmonary status limiting activity   OT Treatment/Interventions: Self-care/ADL training;Energy conservation;DME and/or AE instruction;Therapeutic activities;Patient/family education;Balance training      OT Goals(Current goals can be found in the care plan section)   Acute Rehab OT Goals Patient Stated Goal: to return home and to be able to stand and walk with out an episode of orthostatic hypotension OT Goal Formulation: With patient Time For Goal Achievement: 03/23/24 Potential to Achieve Goals: Good ADL Goals Pt Will Perform Upper Body Bathing: with supervision;sitting (adhering to sternal precautions) Pt Will Perform Lower Body Bathing: with supervision;sitting/lateral leans;sit to/from stand (adhering to sternal precautions) Pt Will Perform Upper Body Dressing: with modified independence;sitting (adhering to sternal precautions) Pt Will Perform Lower Body Dressing: with supervision;sitting/lateral leans;sit to/from stand (adhering to sternal precautions) Pt Will Transfer to Toilet: with modified independence;ambulating;regular height toilet (adhering to sternal precautions; with least restrictive AD) Additional ADL Goal #1: Patient will demonstrate ability to Independently state 4 energy conservation strategies to increase safety  and independence with functional tasks.   OT Frequency:  Min 2X/week    Co-evaluation PT/OT/SLP Co-Evaluation/Treatment: Yes Reason for Co-Treatment: Complexity of the patient's impairments (multi-system involvement);For patient/therapist safety;To address functional/ADL transfers PT goals addressed during session: Mobility/safety with mobility;Balance;Proper use of DME OT goals addressed during session: ADL's and self-care      AM-PAC OT "6 Clicks" Daily Activity     Outcome Measure Help from another person eating meals?: None Help from another person taking care of personal grooming?: A Little Help from another person toileting, which includes using toliet, bedpan, or urinal?: A Lot Help from another person bathing (including washing, rinsing, drying)?: A Lot Help from another person to put on and taking off regular upper body clothing?: A Little Help from another person to put on and taking off regular lower body clothing?: A Lot 6 Click Score: 16   End of Session Equipment Utilized During Treatment: Gait belt;Rolling walker (2 wheels) Nurse Communication: Mobility status;Other (comment) (Vital signs; pt with episode of orthostatic hypotension)  Activity Tolerance: Patient tolerated treatment well;Treatment limited secondary to medical complications (Comment) (limited by episode of orthostatic hypotension) Patient left: in bed;with call bell/phone within reach;with nursing/sitter in room;with family/visitor present  OT Visit Diagnosis: Unsteadiness on feet (R26.81);Other abnormalities of gait and mobility (R26.89);Other (comment) (decreased activity tolerance)                Time: 6578-4696 OT Time Calculation (min): 32 min Charges:  OT General Charges $OT Visit: 1 Visit OT Evaluation $OT Eval Low Complexity: 1 Low  Zuhair Lariccia "Kyle" M., OTR/L, MA Acute Rehab [phone]   Lendon Colonel 03/09/2024, 6:10 PM

## 2024-03-09 NOTE — Progress Notes (Signed)
 Physical Therapy Treatment Patient Details Name: Dave Williams MRN: [id number] DOB: [birth year] Today's Date: 03/09/2024   History of Present Illness Pt is 68 year old presented to [facility] on  03/04/24 for CABG x 3.. PMH - back surgery, CAD    PT Comments  Session focused on theract to promote safe functional mobility and transfers within precautions. Pt displayed deficits with functional mobility and transfers s/t decreases in BP with positional changes. Pt was able to stand for around 30 secs before needing to sit back down s/t increased dizziness and cold sweats, which remained for several minutes and the pt was returned to supine. Additionally, the pt's HR varied once he returned to supine, maxing at 168 bpm. While the pt's BP is limiting his functional mobility, he remains motivated to improve and increase his functional mobility. Will continue to follow acutely.    Pt's BP readings.   Supine: 130/77 (95) HR: 98 EOB: 104/77 (85) HR: 101 Began standing, pt had to sit: 104/68 (79) HR 110 Supine EOS: 116/80 (92) HR: 151    If plan is discharge home, recommend the following: A lot of help with walking and/or transfers;A little help with bathing/dressing/bathroom;Assistance with cooking/housework;Assist for transportation;Help with stairs or ramp for entrance   Can travel by private vehicle        Equipment Recommendations   (RW vs rollator, pending progress)    Recommendations for Other Services Rehab consult     Precautions / Restrictions Precautions Precautions: Fall;Sternal Precaution Booklet Issued: No Recall of Precautions/Restrictions: Intact Precaution/Restrictions Comments: watch orthostatics Restrictions Weight Bearing Restrictions Per Provider Order: No Other Position/Activity Restrictions: sternal precautions     Mobility  Bed Mobility Overal bed mobility: Needs Assistance Bed Mobility: Rolling, Sidelying to Sit, Sit to Sidelying Rolling: Min assist Sidelying to  sit: Min assist     Sit to sidelying: Min assist General bed mobility comments: pt min A for sit to sidelying for LE management and proper sequencing cueing.    Transfers Overall transfer level: Needs assistance Equipment used: None Transfers: Sit to/from Stand Sit to Stand: Min assist, +2 physical assistance           General transfer comment: pt stands with hands on knees and grabs rollator handles once fully upright. Within 30 secs, pt reports increased dizziness and cold sweats    Ambulation/Gait               General Gait Details: deferred s/t pt presentation   Stairs             Wheelchair Mobility     Tilt Bed    Modified Rankin (Stroke Patients Only)       Balance Overall balance assessment: Needs assistance Sitting-balance support: No upper extremity supported, Feet supported Sitting balance-Leahy Scale: Good Sitting balance - Comments: pt sits with UEs across his chest, holding a heart pillow. No LOB or leaning   Standing balance support: During functional activity, No upper extremity supported, Bilateral upper extremity supported Standing balance-Leahy Scale: Fair Standing balance comment: pt initially stands without UE support before grabbing handles of rollator. As his dizziness increases he begins to sway slightly in the frontal plane                            Communication Communication Communication: No apparent difficulties  Cognition Arousal: Alert Behavior During Therapy: WFL for tasks assessed/performed   PT - Cognitive impairments: No apparent impairments  Following commands: Intact      Cueing Cueing Techniques: Verbal cues  Exercises      General Comments General comments (skin integrity, edema, etc.): See PT comments for pt's BP      Pertinent Vitals/Pain Pain Assessment Pain Assessment: Faces Faces Pain Scale: Hurts little more Pain Location: chest Pain  Descriptors / Indicators: Discomfort, Grimacing, Operative site guarding Pain Intervention(s): Monitored during session    Home Living                          Prior Function            PT Goals (current goals can now be found in the care plan section) Acute Rehab PT Goals Patient Stated Goal: return home PT Goal Formulation: With patient Time For Goal Achievement: 03/22/24 Potential to Achieve Goals: Good Progress towards PT goals: Progressing toward goals    Frequency    Min 2X/week      PT Plan      Co-evaluation PT/OT/SLP Co-Evaluation/Treatment: Yes Reason for Co-Treatment: Complexity of the patient's impairments (multi-system involvement);For patient/therapist safety;To address functional/ADL transfers PT goals addressed during session: Mobility/safety with mobility;Balance;Proper use of DME        AM-PAC PT "6 Clicks" Mobility   Outcome Measure  Help needed turning from your back to your side while in a flat bed without using bedrails?: A Little Help needed moving from lying on your back to sitting on the side of a flat bed without using bedrails?: A Little Help needed moving to and from a bed to a chair (including a wheelchair)?: Total Help needed standing up from a chair using your arms (e.g., wheelchair or bedside chair)?: Total Help needed to walk in hospital room?: Total Help needed climbing 3-5 steps with a railing? : Total 6 Click Score: 10    End of Session Equipment Utilized During Treatment: Gait belt Activity Tolerance: Treatment limited secondary to medical complications (Comment) (limited by [facility]) Patient left: in bed;with call bell/phone within reach;with bed alarm set;with nursing/sitter in room;with family/visitor present Nurse Communication: Mobility status;Other (comment) (pt's BP readings & HR) PT Visit Diagnosis: Other abnormalities of gait and mobility (R26.89);Muscle weakness (generalized) (M62.81);Dizziness and giddiness  (R42)     Time: 1610-9604 PT Time Calculation (min) (ACUTE ONLY): 32 min  Charges:    $Therapeutic Activity: 8-22 mins PT General Charges $$ ACUTE PT VISIT: 1 Visit                     [address], SPT    [place] 03/09/2024, 4:28 PM

## 2024-03-09 NOTE — Progress Notes (Addendum)
 Patient's HR remained unchanged with PO metoprolol and Amio bolus, around 1730, he converted to afib RVR, 12 lead EKG showed Aflutter, informed to the PA.  Amiodarone infusion started as per the order.  He complaint of hot flashes for few seconds otherwise,no any symptoms,  BP remained stable.

## 2024-03-10 DIAGNOSIS — I483 Typical atrial flutter: Secondary | ICD-10-CM

## 2024-03-10 LAB — BASIC METABOLIC PANEL WITH GFR
Anion gap: 10 (ref 5–15)
BUN: 19 mg/dL (ref 8–23)
CO2: 27 mmol/L (ref 22–32)
Calcium: 8.9 mg/dL (ref 8.9–10.3)
Chloride: 99 mmol/L (ref 98–111)
Creatinine, Ser: 1.12 mg/dL (ref 0.61–1.24)
GFR, Estimated: >60 mL/min (ref >60)
Glucose, Bld: 119 mg/dL — ABNORMAL HIGH (ref 70–99)
Potassium: 4 mmol/L (ref 3.5–5.1)
Sodium: 136 mmol/L (ref 135–145)

## 2024-03-10 LAB — T4, FREE: Free T4: 0.97 ng/dL (ref 0.61–1.12)

## 2024-03-10 LAB — TSH: TSH: 5.64 u[IU]/mL — ABNORMAL HIGH (ref 0.350–4.500)

## 2024-03-10 MED ORDER — APIXABAN 5 MG PO TABS
5.0000 mg | ORAL_TABLET | Freq: Two times a day (BID) | ORAL | Status: DC
  Administered 2024-03-10 – 2024-03-13 (×7): 5 mg via ORAL
  Filled 2024-03-10 (×7): qty 1

## 2024-03-10 MED ORDER — METOPROLOL TARTRATE 25 MG/10 ML ORAL SUSPENSION
12.5000 mg | Freq: Once | ORAL | Status: AC
  Filled 2024-03-10: qty 5

## 2024-03-10 MED ORDER — METOPROLOL SUCCINATE ER 25 MG PO TB24
25.0000 mg | ORAL_TABLET | Freq: Every day | ORAL | Status: DC
  Administered 2024-03-10 – 2024-03-12 (×3): 25 mg via ORAL
  Filled 2024-03-10 (×3): qty 1

## 2024-03-10 MED ORDER — LACTULOSE 10 GM/15ML PO SOLN
20.0000 g | Freq: Once | ORAL | Status: AC
  Administered 2024-03-10: 20 g via ORAL
  Filled 2024-03-10: qty 30

## 2024-03-10 MED ORDER — ASPIRIN 81 MG PO CHEW: 81.0000 mg | CHEWABLE_TABLET | Freq: Once | ORAL | Status: DC

## 2024-03-10 MED ORDER — ASPIRIN 81 MG PO CHEW
81.0000 mg | CHEWABLE_TABLET | Freq: Once | ORAL | Status: AC
  Administered 2024-03-11: 81 mg via ORAL
  Filled 2024-03-10: qty 1

## 2024-03-10 MED ORDER — METOPROLOL TARTRATE 12.5 MG HALF TABLET
12.5000 mg | ORAL_TABLET | Freq: Once | ORAL | Status: AC
  Administered 2024-03-10: 12.5 mg via ORAL
  Filled 2024-03-10: qty 1

## 2024-03-10 MED ORDER — AMIODARONE HCL 200 MG PO TABS
400.0000 mg | ORAL_TABLET | Freq: Two times a day (BID) | ORAL | Status: DC
  Administered 2024-03-10 – 2024-03-13 (×7): 400 mg via ORAL
  Filled 2024-03-10 (×7): qty 2

## 2024-03-10 NOTE — Progress Notes (Signed)
 Heart rate currently NSR rate 70-80's. Rate has been since around 1am will continue to monitor. Pt resting without distress.

## 2024-03-10 NOTE — Progress Notes (Signed)
 PHARMACY - ANTICOAGULATION CONSULT NOTE  Pharmacy Consult for Eliquis Indication: atrial fibrillation  No Known Allergies  Patient Measurements: Height: 6\' 3"  (190.5 cm) Weight: 98.2 kg (216 lb 7.9 oz) IBW/kg (Calculated) : 84.5 HEPARIN DW (KG): 101.6  Vital Signs: Temp: 98.2 F (36.8 C) (03/30 1100) Temp Source: Oral (03/30 1100) BP: 121/78 (03/30 1200) Pulse Rate: 75 (03/30 1200)  Labs: Recent Labs    03/08/24 0328 03/09/24 0351 03/10/24 0344  CREATININE 1.08 1.11 1.12    Estimated Creatinine Clearance: 77.5 mL/min (by C-G formula based on SCr of 1.12 mg/dL).   Medical History: Past Medical History:  Diagnosis Date   Chronic left hip pain    Coronary artery disease    Hypertension    Pneumonia    As a teenager   Stroke [facility]) 2022   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    Medications:  Scheduled:   amiodarone  400 mg Oral BID   apixaban  5 mg Oral BID   [START ON 03/11/2024] aspirin  81 mg Oral Once   atorvastatin  80 mg Oral Daily   bisacodyl  10 mg Oral Daily   Or   bisacodyl  10 mg Rectal Daily   Chlorhexidine Gluconate Cloth  6 each Topical Daily   metoprolol succinate  25 mg Oral QHS   nicotine  14 mg Transdermal Daily   pantoprazole  40 mg Oral Daily   sodium chloride flush  3 mL Intravenous Q12H   tamsulosin  0.4 mg Oral Daily   Infusions:   amiodarone Stopped (03/10/24 1015)   CHA2DS2-VASc Score = 5   This indicates a 7.2% annual risk of stroke. The patient's score is based upon: CHF History: 0 HTN History: 1 Diabetes History: 0 Stroke History: 2 Vascular Disease History: 1 Age Score: 1 Gender Score: 0  Assessment: Patient s/p CABG x3 with paroxysmal atrial tachy-arrhythmia. ECG consistent with atypical atrial flutter. Pharmacy consulted to initiate Eliquis. Patient was not on anticoagulation PTA. Last CBC stable, hgb 10.1 and plts 162.   Goal of Therapy:  Monitor  platelets by anticoagulation protocol: Yes   Plan:  Begin Eliquis 5 mg BID Discontinue enoxaparin  Monitor for signs/symptoms of bleeding and CBC Follow up price check and patient education on Monday (3/31)  Roslyn Smiling, PharmD PGY1 Pharmacy Resident 03/10/2024 1:33 PM

## 2024-03-10 NOTE — Progress Notes (Addendum)
      [address]       [place] [address]             [phone]      6 Days Post-Op Procedure(s) (LRB): CORONARY ARTERY BYPASS GRAFTING (CABG) TIMES THREE UTILIZING LEFT INTERNAL MAMMARY ARTERY AND ENDOSCOPIC VEIN HARVEST RIGHT GREATER SAPHENOUS VEIN (N/A) ECHOCARDIOGRAM, TRANSESOPHAGEAL (N/A)  Subjective:  Patient is stable.  Somewhat frustrated about HR.  He wants to be up and moving around.  He is passing gas but hasn't moved his bowels.  Objective: Vital signs in last 24 hours: Temp:  [98 F (36.7 C)-98.5 F (36.9 C)] 98.4 F (36.9 C) (03/30 0402) Pulse Rate:  [77-161] 79 (03/30 0402) Cardiac Rhythm: Atrial fibrillation (03/29 1900) Resp:  [16-20] 18 (03/30 0402) BP: (97-117)/(72-89) 114/79 (03/30 0402) SpO2:  [93 %-97 %] 95 % (03/30 0402) Weight:  [98.2 kg] 98.2 kg (03/30 0500)  Intake/Output from previous day: 03/29 0701 - 03/30 0700 In: 1153.8 [P.O.:720; I.V.:433.8] Out: 2425 [Urine:2425]  General appearance: alert, cooperative, and no distress Heart: regular rate and rhythm Lungs: clear to auscultation bilaterally Abdomen: soft, non-tender; bowel sounds normal; no masses,  no organomegaly Extremities: edema trace Wound: clean and dry  Lab Results: No results for input(s): "WBC", "HGB", "HCT", "PLT" in the last 72 hours. BMET:  Recent Labs    03/09/24 0351 03/10/24 0344  NA 139 136  K 4.1 4.0  CL 102 99  CO2 28 27  GLUCOSE 114* 119*  BUN 14 19  CREATININE 1.11 1.12  CALCIUM 9.1 8.9    PT/INR: No results for input(s): "LABPROT", "INR" in the last 72 hours. ABG    Component Value Date/Time   PHART 7.324 (L) 03/04/2024 1927   HCO3 21.1 03/04/2024 1927   TCO2 22 03/04/2024 1927   ACIDBASEDEF 4.0 (H) 03/04/2024 1927   O2SAT 95 03/04/2024 1927   CBG (last 3)  Recent Labs    03/07/24 1202 03/07/24 1631 03/08/24 1203  GLUCAP 112* 121* 113*    Assessment/Plan: S/P Procedure(s) (LRB): CORONARY ARTERY BYPASS GRAFTING  (CABG) TIMES THREE UTILIZING LEFT INTERNAL MAMMARY ARTERY AND ENDOSCOPIC VEIN HARVEST RIGHT GREATER SAPHENOUS VEIN (N/A) ECHOCARDIOGRAM, TRANSESOPHAGEAL (N/A)  SVT.Dave Williams patient suffered days ago...unfortunately post conversion he developed orthoastis... he again developed SVT/A. Fib/flutter yesterday requiring resumption of Amiodarone drip, he did experience some pausing yesterday.. he is NSR in the 70s this morning.. will stop Amiodarone drip... start oral 400 mg BID, continue Lopressor as BP allows.. .will give a single dose this morning and then transition to home XL dose at bedtime to try and counter balance hypotension.... have asked Cardiology to assist with management.... if indicated we are okay to start DOAC Pulm- weaning oxygen as tolerated, continue IS Renal- creatinine has been stable, weight is below admission, minimal edema on exam.. stop Lasix, potassuim GU- urinary retention. He has received several days of flomax.. will d/c foley at midnight to see if patient can void  GI- constipation, will give Lactulose today.. this can attribute to urinary rentention ? Hypothyroid- TSH is elevated slightly, free t4 has been ordered..? He will need follow up with PCP Deconditioning- mobility and activity is limited due to orthostasis, near syncope... PT/OT evals recommending CIR, consult placed   LOS: 6 days    Dave Dandy, PA-C 03/10/2024  Agree with above Cards on board for recurrent SVT Dispo planning  [name] [name]

## 2024-03-10 NOTE — Plan of Care (Signed)

## 2024-03-10 NOTE — Consult Note (Addendum)
 Cardiology Consultation   Dave Williams ID: Kaire Stary MRN: [id number]; DOB: [birth year]  Admit date: 03/04/2024 Date of Consult: 03/10/2024  PCP:  Oneal Grout, FNP   [facility] Providers Cardiologist:  None        Dave Williams Profile:   Dave Williams is a 68 y.o. male with a hx of coronary artery disease s/p CABG, hypertension, TIA, tobacco use, hyperlipidemia who is being seen 03/10/2024 for the evaluation of atrial flutter at the request of Dr. Cliffton Asters.  History of Present Illness:   Dave Williams was seen in office by Dr. Allyson Sabal on 11/27/23 for evaluation of chest pain. In November, Dave Williams had presented to the ED with EMS for evaluation of chest pain with radiation to jaw, shoulder, LUE. This pain was relieved by nitroglycerin. Dave Williams with significant family history of CAD and given this along with personal risk factors, LHC recommended. He underwent radial diagnostic cath by Dr. Swaziland 11/28/2023 revealing three-vessel disease with preserved LV function. He was seen in outpatient CT surgery clinic by Dr. Cliffton Asters on 12/22/23 and plans made for CABG. Dave Williams underwent surgery on 03/24 with bypass grafting x3. Postoperatively, Dave Williams has had intermittent episodes of tachycardia. He was initially treated with Amiodarone bolus, had positive response and was transitioned to PO 200mg  BID. Dave Williams also treated with Lopressor. On 03/28, Dave Williams with apparent syncopal episode while working with PT. Lopressor decreased to 12.5mg  BID on 03/29. Unfortunately, Dave Williams had recurrent tachy-arrhythmia and IV amiodarone was resumed. Today Dave Williams back in NSR. Given recurrent arrhythmia and orthostatic symptoms, cardiology consulted for assistance with medication management.   Today Dave Williams feeling well overall. Reports awareness of rapid HR when in atrial flutter over the past few days but denies any chest pain or shortness of breath associated with this. Orthostatic symptoms have improved  in the last 24 hours. Of note, Dave Williams receiving Flomax 0.4mg  while inpatient due to postoperative urinary retention. Appears that plan is to d/c urinary catheter tonight.    Past Medical History:  Diagnosis Date   Chronic left hip pain    Coronary artery disease    Hypertension    Pneumonia    As a teenager   Stroke [facility]) 2022   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    Past Surgical History:  Procedure Laterality Date   BACK SURGERY     Radiocartography T2-T12   CORONARY ARTERY BYPASS GRAFT N/A 03/04/2024   Procedure: CORONARY ARTERY BYPASS GRAFTING (CABG) TIMES THREE UTILIZING LEFT INTERNAL MAMMARY ARTERY AND ENDOSCOPIC VEIN HARVEST RIGHT GREATER SAPHENOUS VEIN;  Surgeon: Corliss Skains, MD;  Location: [facility] OR;  Service: Open Heart Surgery;  Laterality: N/A;   KNEE ARTHROSCOPY Bilateral    LEFT HEART CATH AND CORONARY ANGIOGRAPHY N/A 11/28/2023   Procedure: LEFT HEART CATH AND CORONARY ANGIOGRAPHY;  Surgeon: Swaziland, Peter M, MD;  Location: [facility] INVASIVE CV LAB;  Service: Cardiovascular;  Laterality: N/A;   PILONIDAL CYST EXCISION     In patients 20s   TEE WITHOUT CARDIOVERSION N/A 03/04/2024   Procedure: ECHOCARDIOGRAM, TRANSESOPHAGEAL;  Surgeon: Corliss Skains, MD;  Location: [facility] OR;  Service: Open Heart Surgery;  Laterality: N/A;     Home Medications:  Prior to Admission medications   Medication Sig Start Date End Date Taking? Authorizing Provider  amLODipine (NORVASC) 5 MG tablet Take 1 tablet (5 mg total) by mouth daily. 11/07/21  Yes Benjiman Core, MD  aspirin 81  MG chewable tablet Chew 81 mg by mouth daily.   Yes [provider]  atorvastatin (LIPITOR) 20 MG tablet Take 20 mg by mouth daily.   Yes [provider]  HYDROcodone-acetaminophen (NORCO) 7.5-325 MG tablet Take 1 tablet by mouth every 6 (six) hours as needed for moderate pain (pain score 4-6).   Yes [provider]   isosorbide mononitrate (IMDUR) 30 MG 24 hr tablet Take 1 tablet (30 mg total) by mouth daily. 11/28/23 11/27/24 Yes Swaziland, Peter M, MD  metoprolol succinate (TOPROL XL) 25 MG 24 hr tablet Take 1 tablet (25 mg total) by mouth daily. 11/28/23 11/27/24 Yes Swaziland, Peter M, MD  nitroGLYCERIN (NITROSTAT) 0.4 MG SL tablet DISSOLVE 1 TABLET UNDER THE TONGUE EVERY 5 MINUTES AS NEEDED FOR CHEST PAIN. DO NOT EXCEED A TOTAL OF 3 DOSES IN 15 MINUTES. 01/26/24  Yes Lightfoot, Eliezer Lofts, MD  Vitamin D, Ergocalciferol, (DRISDOL) 1.25 MG (50000 UNIT) CAPS capsule Take 50,000 Units by mouth once a week. 01/10/24  Yes [provider]  aspirin EC 81 MG tablet Take 1 tablet (81 mg total) by mouth daily. Swallow whole. Dave Williams not taking: Reported on 02/23/2024 11/27/23   Runell Gess, MD    Inpatient Medications: Scheduled Meds:  amiodarone  400 mg Oral BID   aspirin EC  325 mg Oral Daily   Or   aspirin  324 mg Per Tube Daily   atorvastatin  80 mg Oral Daily   bisacodyl  10 mg Oral Daily   Or   bisacodyl  10 mg Rectal Daily   Chlorhexidine Gluconate Cloth  6 each Topical Daily   enoxaparin (LOVENOX) injection  40 mg Subcutaneous QHS   metoprolol succinate  25 mg Oral QHS   nicotine  14 mg Transdermal Daily   pantoprazole  40 mg Oral Daily   sodium chloride flush  3 mL Intravenous Q12H   tamsulosin  0.4 mg Oral Daily   Continuous Infusions:  amiodarone Stopped (03/10/24 1015)   PRN Meds: ipratropium-albuterol, ondansetron (ZOFRAN) IV, oxyCODONE, sodium chloride flush  Allergies:   No Known Allergies  Social History:   Social History   Socioeconomic History   Marital status: Single    Spouse name: Not on file   Number of children: Not on file   Years of education: Not on file   Highest education level: Not on file  Occupational History   Not on file  Tobacco Use   Smoking status: Every Day    Current packs/day: 0.50    Types: Cigarettes   Smokeless tobacco: Never  Vaping Use    Vaping status: Never Used  Substance and Sexual Activity   Alcohol use: No    Comment: Sober for 20 yeras as of 02/2024   Drug use: Not Currently    Types: "Crack" cocaine, Marijuana    Comment: Used for 30+ years but quit in 1990s and has not picked up since   Sexual activity: Yes  Other Topics Concern   Not on file  Social History Narrative   Not on file   Social Drivers of Health   Financial Resource Strain: Not on file  Food Insecurity: No Food Insecurity (03/05/2024)   Hunger Vital Sign    Worried About Running Out of Food in the Last Year: Never true    Ran Out of Food in the Last Year: Never true  Transportation Needs: No Transportation Needs (03/05/2024)   PRAPARE - Administrator, Civil Service (Medical):  No    Lack of Transportation (Non-Medical): No  Physical Activity: Not on file  Stress: Not on file  Social Connections: Moderately Integrated (03/05/2024)   Social Connection and Isolation Panel [NHANES]    Frequency of Communication with Friends and Family: More than three times a week    Frequency of Social Gatherings with Friends and Family: More than three times a week    Attends Religious Services: More than 4 times per year    Active Member of [employer] or Organizations: Yes    Attends Banker Meetings: 1 to 4 times per year    Marital Status: Divorced  Catering manager Violence: Not At Risk (03/05/2024)   Humiliation, Afraid, Rape, and Kick questionnaire    Fear of Current or Ex-Partner: No    Emotionally Abused: No    Physically Abused: No    Sexually Abused: No    Family History:    Family History  Problem Relation Age of Onset   Cancer Mother    Hypertension Mother    Heart failure Mother    Cancer Father    Hypertension Father    Heart failure Father      ROS:  Please see the history of present illness.   All other ROS reviewed and negative.     Physical Exam/Data:   Vitals:   03/10/24 0900 03/10/24 0911 03/10/24  1100 03/10/24 1200  BP: 125/79 125/79 126/76 121/78  Pulse: 81 80 77 75  Resp: 19  17 16   Temp:   98.2 F (36.8 C)   TempSrc:   Oral   SpO2: 92%  93% 92%  Weight:      Height:        Intake/Output Summary (Last 24 hours) at 03/10/2024 1243 Last data filed at 03/10/2024 1155 Gross per 24 hour  Intake 1053.79 ml  Output 3450 ml  Net -2396.21 ml      03/10/2024    5:00 AM 03/09/2024    5:00 AM 03/08/2024    4:35 AM  Last 3 Weights  Weight (lbs) 216 lb 7.9 oz 233 lb 11 oz 233 lb 14.5 oz  Weight (kg) 98.2 kg 106 kg 106.1 kg     Body mass index is 27.06 kg/m.  General:  Well nourished, well developed, in no acute distress HEENT: normal Neck: no JVD Vascular: No carotid bruits; Distal pulses 2+ bilaterally Cardiac:  normal S1, S2; RRR; no murmur  Lungs:  clear to auscultation bilaterally, no wheezing, rhonchi or rales  Abd: soft, nontender, no hepatomegaly  Ext: no edema Musculoskeletal:  No deformities, BUE and BLE strength normal and equal Skin: warm and dry  Neuro:  CNs 2-12 intact, no focal abnormalities noted Psych:  Normal affect   EKG:  The EKG was personally reviewed and demonstrates:  03/09/24 ECG shows atypical atrial flutter with 2:1 conduction.  Telemetry:  Telemetry was personally reviewed and demonstrates:  Today Dave Williams with NSR since converting from atrial flutter at 1am today.   Relevant CV Studies:  11/30/23 TTE  IMPRESSIONS     1. Left ventricular ejection fraction, by estimation, is 65 to 70%. The  left ventricle has normal function. The left ventricle has no regional  wall motion abnormalities. Left ventricular diastolic parameters are  consistent with Grade I diastolic  dysfunction (impaired relaxation).   2. Right ventricular systolic function is normal. The right ventricular  size is normal.   3. The mitral valve is normal in structure. Trivial mitral valve  regurgitation.  No evidence of mitral stenosis.   4. The aortic valve is tricuspid.  There is mild calcification of the  aortic valve. Aortic valve regurgitation is not visualized. Aortic valve  sclerosis/calcification is present, without any evidence of aortic  stenosis.   5. The inferior vena cava is normal in size with greater than 50%  respiratory variability, suggesting right atrial pressure of 3 mmHg.   FINDINGS   Left Ventricle: Left ventricular ejection fraction, by estimation, is 65  to 70%. The left ventricle has normal function. The left ventricle has no  regional wall motion abnormalities. The left ventricular internal cavity  size was normal in size. There is   no left ventricular hypertrophy. Left ventricular diastolic parameters  are consistent with Grade I diastolic dysfunction (impaired relaxation).   Right Ventricle: The right ventricular size is normal. No increase in  right ventricular wall thickness. Right ventricular systolic function is  normal.   Left Atrium: Left atrial size was normal in size.   Right Atrium: Right atrial size was normal in size.   Pericardium: There is no evidence of pericardial effusion.   Mitral Valve: The mitral valve is normal in structure. Trivial mitral  valve regurgitation. No evidence of mitral valve stenosis.   Tricuspid Valve: The tricuspid valve is normal in structure. Tricuspid  valve regurgitation is trivial. No evidence of tricuspid stenosis.   Aortic Valve: The aortic valve is tricuspid. There is mild calcification  of the aortic valve. Aortic valve regurgitation is not visualized. Aortic  valve sclerosis/calcification is present, without any evidence of aortic  stenosis.   Pulmonic Valve: The pulmonic valve was normal in structure. Pulmonic valve  regurgitation is trivial. No evidence of pulmonic stenosis.   Aorta: The aortic root is normal in size and structure.   Venous: The inferior vena cava is normal in size with greater than 50%  respiratory variability, suggesting right atrial pressure of 3  mmHg.   IAS/Shunts: No atrial level shunt detected by color flow Doppler.   Laboratory Data:  High Sensitivity Troponin:  No results for input(s): "TROPONINIHS" in the last 720 hours.   Chemistry Recent Labs  Lab 03/04/24 1632 03/04/24 1634 03/05/24 0413 03/05/24 1538 03/06/24 0430 03/08/24 0328 03/09/24 0351 03/10/24 0344  NA 136   < > 137 134*   < > 135 139 136  K 4.2   < > 4.4 4.0   < > 4.0 4.1 4.0  CL 107  --  107 102   < > 98 102 99  CO2 23  --  24 26   < > 28 28 27   GLUCOSE 128*  --  115* 123*   < > 123* 114* 119*  BUN 11  --  9 9   < > 13 14 19   CREATININE 0.95  --  0.89 0.98   < > 1.08 1.11 1.12  CALCIUM 8.1*  --  7.9* 8.2*   < > 8.7* 9.1 8.9  MG 2.8*  --  2.3 2.3  --   --   --   --   GFRNONAA >60  --  >60 >60   < > >60 >60 >60  ANIONGAP 6  --  6 6   < > 9 9 10    < > = values in this interval not displayed.    No results for input(s): "PROT", "ALBUMIN", "AST", "ALT", "ALKPHOS", "BILITOT" in the last 168 hours. Lipids  Recent Labs  Lab 03/06/24 0430  CHOL 94  TRIG  112  HDL 22*  LDLCALC 50  CHOLHDL 4.3    Hematology Recent Labs  Lab 03/05/24 1538 03/06/24 0430 03/07/24 0400  WBC 12.8* 12.8* 11.8*  RBC 3.13* 3.16* 3.24*  HGB 9.9* 9.9* 10.1*  HCT 29.6* 29.8* 30.2*  MCV 94.6 94.3 93.2  MCH 31.6 31.3 31.2  MCHC 33.4 33.2 33.4  RDW 13.3 13.2 13.1  PLT 148* 144* 162   Thyroid  Recent Labs  Lab 03/10/24 0344 03/10/24 0918  TSH 5.640*  --   FREET4  --  0.97    BNPNo results for input(s): "BNP", "PROBNP" in the last 168 hours.  DDimer No results for input(s): "DDIMER" in the last 168 hours.   Radiology/Studies:  DG CHEST PORT 1 VIEW Result Date: 03/07/2024 CLINICAL DATA:  Right-sided pneumothorax. EXAM: PORTABLE CHEST 1 VIEW COMPARISON:  Chest CT dated 03/06/2024. FINDINGS: Right IJ central venous line in similar position. Similar appearance of small left pleural effusion and left lung base opacity. No detectable pneumothorax. Stable  cardiomediastinal silhouette. Median sternotomy wires. No acute osseous pathology. IMPRESSION: 1. No detectable pneumothorax. 2. Similar appearance of small left pleural effusion and left lung base opacity. Electronically Signed   By: Elgie Collard M.D.   On: 03/07/2024 12:14     Assessment and Plan:   Post-operative atypical atrial flutter CHA2DS2-VASc Score = 5  Dave Williams s/p CABG x3 with paroxysmal atrial tachy-arrhythmia. ECG consistent with atypical atrial flutter, 2:1 conduction. Dave Williams initially converted with IV Amiodarone but had recurrence after PO transition. Now back in NSR today after more IV amiodarone 03/29.  Dave Williams has now received 2.95g Amiodarone. Continue Amiodarone 400mg  BID x5 days to complete 5g load, then would transition to 200mg  daily. Given underlying lung disease, would recommend overall limited course postoperatively. Can re-evaluate in outpatient follow up.   Start Eliquis 5mg  BID, decrease ASA to 81mg .  Continue Toprol XL 25mg  daily  Orthostatic hypotension Dave Williams with orthostatic hypotension/syncope following surgery while working with PT. Suspect that this is secondary to a combination of deconditioning and daily Flomax.  Ideally hold Flomax as soon as able or consider alternative treatment for urinary retention.  Hyperlipidemia Continue home statin. Lab Results  Component Value Date   CHOL 94 03/06/2024   HDL 22 (L) 03/06/2024   LDLCALC 50 03/06/2024   TRIG 112 03/06/2024   CHOLHDL 4.3 03/06/2024   Hypertension BP stable following surgery. Continue Toprol XL 25mg .     Risk Assessment/Risk Scores:          CHA2DS2-VASc Score = 5   This indicates a 7.2% annual risk of stroke. The Dave Williams's score is based upon: CHF History: 0 HTN History: 1 Diabetes History: 0 Stroke History: 2 Vascular Disease History: 1 Age Score: 1 Gender Score: 0         For questions or updates, please contact [facility] Please consult  [email or web address] for contact info under    Signed, Matheu Ploeger, PA-C  03/10/2024 12:43 PM  I have seen and examined the Dave Williams along with Perlie Gold, PA-C .  I have reviewed the chart, notes and new data.  I agree with PA/NP's note.  Key new complaints: When the arrhythmia occurs he does have palpitations but he denies dizziness, chest pain or shortness of breath. Key examination changes: Sternotomy scar healing well.  Currently in regular rhythm.  Clear lungs.  Normal cardiovascular examination.  Less Key new findings / data: Review of the ECGs and telemetry shows recurrent episodes of regular supraventricular tachycardia that  subsequently develops varying degrees of AV block.  Most of the time it does look like typical counterclockwise right atrial flutter.  PLAN: Switch to oral amiodarone.  Continue this for minimum of 30 days, possibly as long as 3 months in the postoperative period.  If the arrhythmia recurs after we stop amiodarone he is best suited for cavotricuspid isthmus ablation as long-term management. Increased embolic risk due to previous history of TIA in the presence of vascular disease and hypertension.  Plan to continue on Eliquis, which should be extended even after we stopped the amiodarone for at least a few months. If the arrhythmia recurs while he is in inpatient rehab, we can follow-up with him there.  Thurmon Fair, MD, [facility] [phone] 03/10/2024, 1:35 PM

## 2024-03-10 NOTE — Plan of Care (Signed)
   Problem: Clinical Measurements: Goal: Will remain free from infection Outcome: Progressing   Problem: Clinical Measurements: Goal: Diagnostic test results will improve Outcome: Progressing   Problem: Clinical Measurements: Goal: Respiratory complications will improve Outcome: Progressing   Problem: Clinical Measurements: Goal: Cardiovascular complication will be avoided Outcome: Progressing

## 2024-03-11 ENCOUNTER — Other Ambulatory Visit (HOSPITAL_COMMUNITY): Payer: Self-pay

## 2024-03-11 ENCOUNTER — Telehealth (HOSPITAL_COMMUNITY): Payer: Self-pay | Admitting: Pharmacy Technician

## 2024-03-11 DIAGNOSIS — Z951 Presence of aortocoronary bypass graft: Secondary | ICD-10-CM | POA: Diagnosis not present

## 2024-03-11 DIAGNOSIS — R5381 Other malaise: Secondary | ICD-10-CM

## 2024-03-11 DIAGNOSIS — I951 Orthostatic hypotension: Secondary | ICD-10-CM

## 2024-03-11 LAB — BASIC METABOLIC PANEL WITH GFR
Anion gap: 11 (ref 5–15)
BUN: 16 mg/dL (ref 8–23)
CO2: 22 mmol/L (ref 22–32)
Calcium: 8.8 mg/dL — ABNORMAL LOW (ref 8.9–10.3)
Chloride: 101 mmol/L (ref 98–111)
Creatinine, Ser: 1.03 mg/dL (ref 0.61–1.24)
GFR, Estimated: >60 mL/min (ref >60)
Glucose, Bld: 115 mg/dL — ABNORMAL HIGH (ref 70–99)
Potassium: 4 mmol/L (ref 3.5–5.1)
Sodium: 134 mmol/L — ABNORMAL LOW (ref 135–145)

## 2024-03-11 MED ORDER — FUROSEMIDE 10 MG/ML IJ SOLN
20.0000 mg | Freq: Once | INTRAMUSCULAR | Status: AC
  Administered 2024-03-11: 20 mg via INTRAVENOUS
  Filled 2024-03-11: qty 2

## 2024-03-11 MED ORDER — SMOG ENEMA
300.0000 mL | Freq: Once | RECTAL | Status: DC
  Filled 2024-03-11: qty 960

## 2024-03-11 MED ORDER — LACTULOSE 10 GM/15ML PO SOLN
20.0000 g | Freq: Once | ORAL | Status: AC
  Administered 2024-03-11: 20 g via ORAL
  Filled 2024-03-11: qty 30

## 2024-03-11 NOTE — Progress Notes (Addendum)
 Inpatient Rehab Coordinator Note:    I met with patient at bedside to discuss CIR recommendations and goals/expectations of CIR stay.  We reviewed 3 hrs/day of therapy, physician follow up, and average length of stay 2 weeks (dependent upon progress) with goals of mod I. Patient is interested in pursuing CIR. He does live alone and would only have prn assist from neighbors at discharge. I do think he would reach mod I level. Will await MD consult and begin auth if MD agrees. Continue to follow.   Rehab Admissons Coordinator [place], [place], [place] [phone]

## 2024-03-11 NOTE — Progress Notes (Signed)
 CARDIAC REHAB PHASE I    Pt resting in bed, feeling well today. Reports sitting in chair most of the morning. Reports Mobility team to assist with walk shortly. Reviewed postop education provided 3-21. All questions and concerns addressed. Will continue to follow.   [phone] Woodroe Chen, RN BSN 03/11/2024 11:10 AM

## 2024-03-11 NOTE — Progress Notes (Addendum)
 7 Days Post-Op Procedure(s) (LRB): CORONARY ARTERY BYPASS GRAFTING (CABG) TIMES THREE UTILIZING LEFT INTERNAL MAMMARY ARTERY AND ENDOSCOPIC VEIN HARVEST RIGHT GREATER SAPHENOUS VEIN (N/A) ECHOCARDIOGRAM, TRANSESOPHAGEAL (N/A) Subjective: Feels ok, no BM, + flatus- nor uncomfortable  Objective: Vital signs in last 24 hours: Temp:  [97.5 F (36.4 C)-98.6 F (37 C)] 98 F (36.7 C) (03/31 0300) Pulse Rate:  [75-84] 75 (03/31 0300) Cardiac Rhythm: Normal sinus rhythm (03/30 1900) Resp:  [16-20] 20 (03/31 0520) BP: (117-126)/(74-86) 121/74 (03/31 0520) SpO2:  [90 %-95 %] 93 % (03/31 0300) Weight:  [90.9 kg] 90.9 kg (03/31 0527)  Hemodynamic parameters for last 24 hours:    Intake/Output from previous day: 03/30 0701 - 03/31 0700 In: 620 [P.O.:620] Out: 2025 [Urine:2025] Intake/Output this shift: No intake/output data recorded.  General appearance: alert, cooperative, and no distress Heart: regular rate and rhythm Lungs: mildly dim in left base Abdomen: benign Extremities: no edema Wound: incis healing well  Lab Results: No results for input(s): "WBC", "HGB", "HCT", "PLT" in the last 72 hours. BMET:  Recent Labs    03/10/24 0344 03/11/24 0145  NA 136 134*  K 4.0 4.0  CL 99 101  CO2 27 22  GLUCOSE 119* 115*  BUN 19 16  CREATININE 1.12 1.03  CALCIUM 8.9 8.8*    PT/INR: No results for input(s): "LABPROT", "INR" in the last 72 hours. ABG    Component Value Date/Time   PHART 7.324 (L) 03/04/2024 1927   HCO3 21.1 03/04/2024 1927   TCO2 22 03/04/2024 1927   ACIDBASEDEF 4.0 (H) 03/04/2024 1927   O2SAT 95 03/04/2024 1927   CBG (last 3)  Recent Labs    03/08/24 1203  GLUCAP 113*    Meds Scheduled Meds:  amiodarone  400 mg Oral BID   apixaban  5 mg Oral BID   aspirin  81 mg Oral Once   atorvastatin  80 mg Oral Daily   bisacodyl  10 mg Oral Daily   Or   bisacodyl  10 mg Rectal Daily   Chlorhexidine Gluconate Cloth  6 each Topical Daily   metoprolol  succinate  25 mg Oral QHS   nicotine  14 mg Transdermal Daily   pantoprazole  40 mg Oral Daily   sodium chloride flush  3 mL Intravenous Q12H   tamsulosin  0.4 mg Oral Daily   Continuous Infusions:  amiodarone Stopped (03/10/24 1015)   PRN Meds:.ipratropium-albuterol, ondansetron (ZOFRAN) IV, oxyCODONE, sodium chloride flush  Xrays No results found.  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

## 2024-03-11 NOTE — Progress Notes (Signed)
 Physical Therapy Treatment Patient Details Name: Dave Williams MRN: [id number] DOB: [birth year] Today's Date: 03/11/2024   History of Present Illness Pt is 68 year old presented to [facility] [place] on  03/04/24 for CABG x 3.. PMH - back surgery, CAD    PT Comments  The pt was agreeable to session, continues to be limited by soft BP with upright activity (and symptoms of dizziness, see values below). The pt is highly motivated, able to complete various LE exercises in sitting and repeated sit-stand transfers from EOB to improve LE power for transfers. Gait remains limited by dizziness with standing after 1-2 min, but was able to tolerate standing marches x20 and steps from chair to EOB. Recommendations remain appropriate.   VITALS:  - sitting in recliner - BP: 135/113 (120); HR: 90bpm - standing - BP: 98/63 (73); HR: 96bpm - standing 2 min - BP: 90/58 (68); HR: 91bpm - sitting EOB - BP: 118/76 (88); HR: 90bpm   HR: max 127bpm with activity, increased frequency of PVCs     If plan is discharge home, recommend the following: A lot of help with walking and/or transfers;A little help with bathing/dressing/bathroom;Assistance with cooking/housework;Assist for transportation;Help with stairs or ramp for entrance   Can travel by private Psychologist, clinical (4 wheels)    Recommendations for Other Services Rehab consult     Precautions / Restrictions Precautions Precautions: Fall;Sternal Precaution Booklet Issued: No Recall of Precautions/Restrictions: Intact Precaution/Restrictions Comments: watch orthostatics Restrictions Weight Bearing Restrictions Per Provider Order: No Other Position/Activity Restrictions: sternal precautions     Mobility  Bed Mobility Overal bed mobility: Needs Assistance Bed Mobility: Rolling, Sit to Sidelying Rolling: Min assist       Sit to sidelying: Min assist General bed mobility comments: pt min A for sit to sidelying for LE  management and proper sequencing cueing.    Transfers Overall transfer level: Needs assistance Equipment used: None Transfers: Sit to/from Stand, Bed to chair/wheelchair/BSC Sit to Stand: Min assist, Contact guard assist, From elevated surface   Step pivot transfers: Min assist       General transfer comment: pt stands with hands on knees and grabs rollator handles once fully upright. Within 30 secs, pt reports increased dizziness and cold sweats. needing minA to boost from low chair and CGA from elevated bed. completed x7 in session    Ambulation/Gait Ambulation/Gait assistance: Contact guard assist Gait Distance (Feet): 5 Feet Assistive device: Rollator (4 wheels) Gait Pattern/deviations: Step-to pattern, Decreased stride length Gait velocity: decreased Gait velocity interpretation: <1.31 ft/sec, indicative of household ambulator   General Gait Details: pt able to manage small lateral steps from chair to bed, reports dizziness after arriving at bed      Balance Overall balance assessment: Needs assistance Sitting-balance support: No upper extremity supported, Feet supported Sitting balance-Leahy Scale: Good Sitting balance - Comments: pt sits with UEs across his chest, holding a heart pillow. No LOB or leaning   Standing balance support: During functional activity, No upper extremity supported, Bilateral upper extremity supported Standing balance-Leahy Scale: Fair Standing balance comment: pt initially stands without UE support before grabbing handles of rollator. As his dizziness increases he begins to sway slightly in the frontal plane                            Communication Communication Communication: No apparent difficulties  Cognition Arousal: Alert Behavior During Therapy: [facility] for  tasks assessed/performed   PT - Cognitive impairments: No apparent impairments                         Following commands: Intact      Cueing Cueing  Techniques: Verbal cues  Exercises General Exercises - Lower Extremity Hip Flexion/Marching: AROM, Both, 10 reps, Seated Heel Raises: AROM, Both, 20 reps, Seated Other Exercises Other Exercises: repeated sit-stand from elevated bed    General Comments General comments (skin integrity, edema, etc.): BP consistently dropping with upright activity. HR to 127bpm, increased rate of PVC with standing      Pertinent Vitals/Pain Pain Assessment Pain Assessment: No/denies pain Pain Intervention(s): Monitored during session    Home Living   Living Arrangements: Alone   Type of Home: House Home Access: Ramped entrance       Home Layout: One level            PT Goals (current goals can now be found in the care plan section) Acute Rehab PT Goals Patient Stated Goal: return home PT Goal Formulation: With patient Time For Goal Achievement: 03/22/24 Potential to Achieve Goals: Good Progress towards PT goals: Progressing toward goals    Frequency    Min 2X/week           Co-evaluation PT/OT/SLP Co-Evaluation/Treatment: Yes Reason for Co-Treatment: Complexity of the patient's impairments (multi-system involvement);For patient/therapist safety;To address functional/ADL transfers PT goals addressed during session: Mobility/safety with mobility;Balance;Proper use of DME        AM-PAC PT "6 Clicks" Mobility   Outcome Measure  Help needed turning from your back to your side while in a flat bed without using bedrails?: A Little Help needed moving from lying on your back to sitting on the side of a flat bed without using bedrails?: A Little Help needed moving to and from a bed to a chair (including a wheelchair)?: A Little Help needed standing up from a chair using your arms (e.g., wheelchair or bedside chair)?: A Little Help needed to walk in hospital room?: Total Help needed climbing 3-5 steps with a railing? : Total 6 Click Score: 14    End of Session Equipment Utilized  During Treatment: Gait belt Activity Tolerance: Treatment limited secondary to medical complications (Comment) (limited by [facility]) Patient left: in bed;with call bell/phone within reach;with bed alarm set;with nursing/sitter in room;with family/visitor present Nurse Communication: Mobility status;Other (comment) (pt's BP readings & HR) PT Visit Diagnosis: Other abnormalities of gait and mobility (R26.89);Muscle weakness (generalized) (M62.81);Dizziness and giddiness (R42)     Time: 4098-1191 PT Time Calculation (min) (ACUTE ONLY): 35 min  Charges:    $Therapeutic Exercise: 8-22 mins $Therapeutic Activity: 8-22 mins PT General Charges $$ ACUTE PT VISIT: 1 Visit                     Vickki Muff, PT, DPT   Acute Rehabilitation Department Office [phone] Secure Chat Communication Preferred   Ronnie Derby 03/11/2024, 3:26 PM

## 2024-03-11 NOTE — Consult Note (Signed)
 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 Cliffton Asters. Pt with hx of severe COPD, post-operative with a lot of productive cough, concern for pneumonia, oxygen requirement, a-flutter. Orthostatic hypotension affecting mobility tolerance as well with syncope/LOC recorded.  +urine retention--flomax. Hypothyroid as well. Now on room air. Pt has been up with therapy and transferring at [facility] assist. He is walking with rollator 4-5' at [facility] assist. Pt lives alone in a one level home with ramped entrance. He was working and independent before.    Review of Systems  Constitutional: Negative.   HENT: Negative.    Eyes: Negative.   Respiratory:  Positive for cough and shortness of breath.   Cardiovascular:  Positive for chest pain and leg swelling.  Gastrointestinal:  Positive for nausea.  Genitourinary: Negative.   Musculoskeletal:  Positive for myalgias.  Skin: Negative.   Neurological:  Positive for dizziness.  Psychiatric/Behavioral: Negative.     Past Medical History:  Diagnosis Date   Chronic left hip pain    Coronary artery disease    Hypertension    Pneumonia    As a teenager   Stroke [facility]) 2022   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   Past Surgical History:  Procedure Laterality Date   BACK SURGERY     Radiocartography T2-T12   CORONARY ARTERY BYPASS GRAFT N/A 03/04/2024   Procedure: CORONARY ARTERY BYPASS GRAFTING (CABG) TIMES THREE UTILIZING LEFT INTERNAL MAMMARY ARTERY AND ENDOSCOPIC VEIN HARVEST RIGHT GREATER SAPHENOUS VEIN;  Surgeon: Corliss Skains, MD;  Location: [facility] OR;  Service: Open Heart Surgery;  Laterality: N/A;   KNEE ARTHROSCOPY Bilateral    LEFT HEART CATH AND  CORONARY ANGIOGRAPHY N/A 11/28/2023   Procedure: LEFT HEART CATH AND CORONARY ANGIOGRAPHY;  Surgeon: [place], Peter M, MD;  Location: [facility] INVASIVE CV LAB;  Service: Cardiovascular;  Laterality: N/A;   PILONIDAL CYST EXCISION     In patients 20s   TEE WITHOUT CARDIOVERSION N/A 03/04/2024   Procedure: ECHOCARDIOGRAM, TRANSESOPHAGEAL;  Surgeon: Corliss Skains, MD;  Location: [facility] OR;  Service: Open Heart Surgery;  Laterality: N/A;   Family History  Problem Relation Age of Onset   Cancer Mother    Hypertension Mother    Heart failure Mother    Cancer Father    Hypertension Father    Heart failure Father    Social History:  reports that he has been smoking cigarettes. He has never used smokeless tobacco. He reports that he does not currently use drugs after having used the following drugs: "Crack" cocaine and Marijuana. He reports that he does not drink alcohol. Allergies: No Known Allergies Medications Prior to Admission  Medication Sig Dispense Refill   amLODipine (NORVASC) 5 MG tablet Take 1 tablet (5 mg total) by mouth daily. 30 tablet 0   aspirin 81 MG chewable tablet Chew 81 mg by mouth daily.     atorvastatin (LIPITOR) 20 MG tablet Take 20 mg by mouth daily.     HYDROcodone-acetaminophen (NORCO) 7.5-325 MG tablet Take 1 tablet by mouth every 6 (six) hours as needed for moderate pain (pain score 4-6).     isosorbide mononitrate (IMDUR) 30 MG 24  hr tablet Take 1 tablet (30 mg total) by mouth daily. 30 tablet 11   metoprolol succinate (TOPROL XL) 25 MG 24 hr tablet Take 1 tablet (25 mg total) by mouth daily. 30 tablet 11   nitroGLYCERIN (NITROSTAT) 0.4 MG SL tablet DISSOLVE 1 TABLET UNDER THE TONGUE EVERY 5 MINUTES AS NEEDED FOR CHEST PAIN. DO NOT EXCEED A TOTAL OF 3 DOSES IN 15 MINUTES. 25 tablet 2   Vitamin D, Ergocalciferol, (DRISDOL) 1.25 MG (50000 UNIT) CAPS capsule Take 50,000 Units by mouth once a week.     aspirin EC 81 MG tablet Take 1 tablet (81 mg total) by mouth daily. Swallow  whole. (Patient not taking: Reported on 02/23/2024)      Home: Home Living Family/patient expects to be discharged to:: Private residence Living Arrangements: Alone Available Help at Discharge: Friend(s), Available PRN/intermittently Type of Home: House Home Access: Ramped entrance Home Layout: One level Bathroom Shower/Tub: Engineer, manufacturing systems: Standard Home Equipment: None  Functional History: Prior Function Prior Level of Function : Independent/Modified Independent, Driving, Working/employed Mobility Comments: Independent without an AD ADLs Comments: Independent with ADLs and IADLs, drives, and works as an Environmental education officer Status:  Mobility: Bed Mobility Overal bed mobility: Needs Assistance Bed Mobility: Rolling, Sidelying to Sit, Sit to Sidelying Rolling: Min assist Sidelying to sit: Min assist Sit to sidelying: Min assist General bed mobility comments: pt min A for sit to sidelying for LE management and cueing for technique while adhering to sternal precautions Transfers Overall transfer level: Needs assistance Equipment used: None Transfers: Sit to/from Stand Sit to Stand: Min assist, +2 safety/equipment, +2 physical assistance General transfer comment: pt stood adhering to sternal precautions with hands on knees and grasped rollator handles once fully upright. However, within 30 secs of standing, pt reporting increased dizziness, cold sweats, and reporting need to sit back down. Ambulation/Gait Ambulation/Gait assistance: Contact guard assist Gait Distance (Feet): 5 Feet Assistive device: Rollator (4 wheels) Gait Pattern/deviations: Step-through pattern, Decreased stride length General Gait Details: deferred s/t pt presentation Gait velocity: decr Gait velocity interpretation: 1.31 - 2.62 ft/sec, indicative of limited community ambulator    ADL: ADL Overall ADL's : Needs assistance/impaired Eating/Feeding: Independent,  Sitting Grooming: Supervision/safety, Cueing for compensatory techniques, Sitting (adhering to sternal precautions) Upper Body Bathing: Contact guard assist, Minimal assistance, Cueing for compensatory techniques, Sitting (adhering to sternal precautions) Lower Body Bathing: Supervison/ safety, Sitting/lateral leans, Maximal assistance, Sit to/from stand, +2 for physical assistance, +2 for safety/equipment, Cueing for compensatory techniques (adhering to sternal precautions) Upper Body Dressing : Minimal assistance, Cueing for compensatory techniques, Sitting (adhering to sternal precautions) Lower Body Dressing: Supervision/safety, Sitting/lateral leans, Maximal assistance, Sit to/from stand, Cueing for compensatory techniques, +2 for physical assistance, +2 for safety/equipment (adhering to sternal precautions) Toilet Transfer Details (indicate cue type and reason): deferred for pt/therapist safety due to pt with episode of orthostatic hypotension Toileting- Clothing Manipulation and Hygiene: Supervision/safety, Sitting/lateral lean, Cueing for compensatory techniques Functional mobility during ADLs:  (deferred for pt/therapist safety due to pt with episode of orthostatic hypotension) General ADL Comments: Pt with decreased activity tolerance and episode of orthostatic hypotension limiting functional level. Pt also with decreased knowledge of compensatory strategies and adaptive techniques to complete ADLs while adhering to sternal precautions.  Cognition: Cognition Orientation Level: Oriented X4 Cognition Arousal: Alert Behavior During Therapy: WFL for tasks assessed/performed  Blood pressure 134/81, pulse 82, temperature 98.1 F (36.7 C), temperature source Oral, resp. rate 16, height 6\' 3"  (1.905 m),  weight 90.9 kg, SpO2 96%. Physical Exam Constitutional:      Appearance: He is not ill-appearing.  HENT:     Head: Normocephalic.     Right Ear: External ear normal.     Left Ear: External  ear normal.     Nose: Nose normal.     Mouth/Throat:     Mouth: Mucous membranes are moist.  Eyes:     Extraocular Movements: Extraocular movements intact.     Pupils: Pupils are equal, round, and reactive to light.  Cardiovascular:     Rate and Rhythm: Normal rate.  Pulmonary:     Effort: Pulmonary effort is normal.  Abdominal:     General: Bowel sounds are normal.  Musculoskeletal:     Cervical back: Normal range of motion.     Right lower leg: Edema present.  Skin:    Comments: Chest incision CDI. Donor sites intact/dry  Neurological:     Mental Status: He is alert.     Comments: Alert and oriented x 3. Normal insight and awareness. Intact Memory. Normal language and speech. Cranial nerve exam unremarkable. MMT: UE 4/5 prox to distal. LE 4-/5 HF, KE and 4+/5 ADF/PF. Sensory exam normal for light touch and pain in all 4 limbs. No limb ataxia or cerebellar signs. No abnormal tone appreciated.  Marland Kitchen    Psychiatric:        Mood and Affect: Mood normal.        Behavior: Behavior normal.     Results for orders placed or performed during the hospital encounter of 03/04/24 (from the past 24 hours)  Basic metabolic panel     Status: Abnormal   Collection Time: 03/11/24  1:45 AM  Result Value Ref Range   Sodium 134 (L) 135 - 145 mmol/L   Potassium 4.0 3.5 - 5.1 mmol/L   Chloride 101 98 - 111 mmol/L   CO2 22 22 - 32 mmol/L   Glucose, Bld 115 (H) 70 - 99 mg/dL   BUN 16 8 - 23 mg/dL   Creatinine, Ser 4.09 0.61 - 1.24 mg/dL   Calcium 8.8 (L) 8.9 - 10.3 mg/dL   GFR, Estimated >81 >19 mL/min   Anion gap 11 5 - 15   No results found.  Assessment/Plan: Diagnosis: 68 yo male with debility after 3 vessel CABG.  Does the need for close, 24 hr/day medical supervision in concert with the patient's rehab needs make it unreasonable for this patient to be served in a less intensive setting? Yes Co-Morbidities requiring supervision/potential complications:  -COPD -symptomatic orthostatic  hypotension -post-op a-flutter -hypertension Due to bladder management, bowel management, safety, skin/wound care, disease management, medication administration, pain management, and patient education, does the patient require 24 hr/day rehab nursing? Yes Does the patient require coordinated care of a physician, rehab nurse, therapy disciplines of PT, OT to address physical and functional deficits in the context of the above medical diagnosis(es)? Yes Addressing deficits in the following areas: balance, endurance, locomotion, strength, transferring, bowel/bladder control, bathing, dressing, feeding, grooming, toileting, and psychosocial support Can the patient actively participate in an intensive therapy program of at least 3 hrs of therapy per day at least 5 days per week? Yes The potential for patient to make measurable gains while on inpatient rehab is excellent Anticipated functional outcomes upon discharge from inpatient rehab are modified independent  with PT, modified independent with OT, n/a with SLP. Estimated rehab length of stay to reach the above functional goals is: 7 days Anticipated discharge  destination: Home Overall Rehab/Functional Prognosis: excellent  POST ACUTE RECOMMENDATIONS: This patient's condition is appropriate for continued rehabilitative care in the following setting: CIR Patient has agreed to participate in recommended program. Yes Note that insurance prior authorization may be required for reimbursement for recommended care.  Comment: Pt lives alone. He has had  documented post-operative atrial flutter and episodes of orthostasis, some manifesting in syncope/loss of consciousness. Would benefit from close monitoring on inpatient rehab as we push him physically with mobility and self-care to reach modified independent goals    I have personally performed a face to face diagnostic evaluation of this patient. Additionally, I have examined the patient's medical record  including any pertinent labs and radiographic images.    Thanks,  [name], MD 03/11/2024

## 2024-03-11 NOTE — Progress Notes (Signed)
 Patient state he has the urge to have the bowel moment and pee,requested  to take him to the bathroom,taken to the bathroom without any event he void successfully but negative for bowel moment ,upon returning he was lightheaded and steady was used ,he refused to take laxative says he is weak and tired

## 2024-03-11 NOTE — Progress Notes (Signed)
 Patient did not void even after 6 hours of removing of foley catheter,RN attempted to bladder scan the patient but he asked for another one hour, went back after an hour but he refused again saying that he does not wants to be scanned, says he will pee when his body is ready,he is used to not peeing frequently as he was in [employer] where he used to hold his urine for days , [place] PA paged and the RN from OR call back ,explained the situation ,she state she will let PA know

## 2024-03-11 NOTE — Progress Notes (Signed)
 Mobility Specialist Progress Note:    03/11/24 1100  Mobility  Activity Transferred from bed to chair  Level of Assistance Minimal assist, patient does 75% or more  Assistive Device None  Distance Ambulated (ft) 4 ft  RUE Weight Bearing Per Provider Order NWB  LUE Weight Bearing Per Provider Order NWB  Activity Response Tolerated well  Mobility Referral Yes  Mobility visit 1 Mobility  Mobility Specialist Start Time (ACUTE ONLY) 1100  Mobility Specialist Stop Time (ACUTE ONLY) 1109  Mobility Specialist Time Calculation (min) (ACUTE ONLY) 9 min   Pt received in bed, agreeable to mobility session. Recorded BP d/t previous PT session where pt experienced LOC. Deferred intense ambulation/ mobility d/t upcoming PT session. Denies dizziness.  Transferred B>C via MinA. No AD required. Left pt in chair, nursing at chairside, all needs met.   BP Sitting EOB:126/61 (79) Post Mobility:  134/81 (98)  Feliciana Rossetti Mobility Specialist Please contact via Special educational needs teacher or  Rehab office at [phone]

## 2024-03-11 NOTE — PMR Pre-admission (Signed)
 PMR Admission Coordinator Pre-Admission Assessment  Patient: Dave Williams is an 68 y.o., male MRN: [id number] DOB: [birth year] Height: 6\' 3"  (190.5 cm) Weight: 97.2 kg              Insurance Information HMO: yes    PPO:      PCP:      IPA:      80/20:      OTHER:  PRIMARY: [employer] Medicare HMO/PPO    Policy#: [id number]      Subscriber: patient CM Name: Annice Pih      Phone#: [phone]     Fax#: [phone] Pre-Cert#: [id number] approved 4/2 until 4/9 with updates due 4/7 to Tiffany phone; unavailable fax [phone]      Employer:  Benefits:  Phone #: [phone]     Name: 3/31 Eff. Date: 12/13/2023-still active     Deduct: does not have      Out of Pocket Max: $5,500( $100 met)       CIR: $374/day co pay with max copay of $2,992/admission ( 8 days) SNF: $10/day co pay for days 1-20 Outpatient: $30/visit co pay      Home Health: $20/copay /visit       DME: 80% coverage     20% co insurance  The "Data Collection Information Summary" for patients in Inpatient Rehabilitation Facilities with attached "Privacy Act Statement-Health Care Records" was provided and verbally reviewed with: Patient  Emergency Contact Information Contact Information     Name Relation Home Work [place] Sister [phone]  [phone]      Other Contacts   None on File    Current Medical History  Patient Admitting Diagnosis: CABG x 3  History of Present Illness: Dave Williams 68 y.o. male with past medical history of COPD. Presents for surgical evaluation of three-vessel coronary artery disease. He works as an Museum/gallery exhibitions officer, and has had 2 emergency room visits for worsening chest pain and shortness of breath. He subsequently underwent a left heart cath 11/28/23 which identified the disease. Currently experiencing some anginal symptoms with exertion. Admitted 03/04/24.  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 Develpoed urinary retention, started flomax. SVT, rate in 140s received amiodarone bolus.  Patient with deconditioning, mobility limited by orthostasis, near syncope.   Patient's medical record from [facility] has been reviewed by the rehabilitation admission coordinator and physician.  Past Medical History  Past Medical History:  Diagnosis Date   Chronic left hip pain    Coronary artery disease    Hypertension    Pneumonia    As a teenager   Stroke [facility]) 2022   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   Has the patient had major surgery during 100 days prior to admission? Yes  Family History  family history includes Cancer in his father and mother; Heart failure in his father and mother; Hypertension in his father and mother.  Current Medications   Current Facility-Administered Medications:    amiodarone (NEXTERONE PREMIX) 360-4.14 MG/200ML-% (1.8 mg/mL) IV infusion, 30 mg/hr, Intravenous, Continuous, Barrett, Erin R, PA-C, Stopped at 03/10/24 1015   amiodarone (PACERONE) tablet 400 mg, 400 mg, Oral, BID, Barrett, Erin R, PA-C, 400 mg at 03/12/24 0857   apixaban (ELIQUIS) tablet 5 mg, 5 mg, Oral, BID, Roslyn Smiling, RPH, 5 mg at 03/12/24 0856   atorvastatin (LIPITOR) tablet 80 mg, 80 mg, Oral, Daily, Gold, Wayne E, PA-C, 80 mg at 03/12/24 [phone]  bisacodyl (DULCOLAX) EC tablet 10 mg, 10 mg, Oral, Daily, 10 mg at 03/11/24 0846 **OR** bisacodyl (DULCOLAX) suppository 10 mg, 10 mg, Rectal, Daily, Gold, Wayne E, PA-C   Chlorhexidine Gluconate Cloth 2 % PADS 6 each, 6 each, Topical, Daily, Lightfoot, Eliezer Lofts, MD, 6 each at 03/12/24 0857   ipratropium-albuterol (DUONEB) 0.5-2.5 (3) MG/3ML nebulizer solution 3 mL, 3 mL, Nebulization, Q4H PRN, Gold, Wayne E, PA-C   metoprolol succinate (TOPROL-XL) 24 hr tablet 25 mg, 25 mg, Oral, QHS, Barrett, Erin R, PA-C, 25 mg at 03/11/24 2056   nicotine (NICODERM CQ - dosed in mg/24 hours) patch 14 mg, 14 mg, Transdermal, Daily, Gold, Wayne E, PA-C, 14 mg at 03/12/24 0901   ondansetron  (ZOFRAN) injection 4 mg, 4 mg, Intravenous, Q6H PRN, Gold, Wayne E, PA-C, 4 mg at 03/04/24 1512   oxyCODONE (Oxy IR/ROXICODONE) immediate release tablet 5-10 mg, 5-10 mg, Oral, Q3H PRN, Doylene Canning, Wayne E, PA-C, 10 mg at 03/11/24 2045   pantoprazole (PROTONIX) EC tablet 40 mg, 40 mg, Oral, Daily, Gold, Wayne E, PA-C, 40 mg at 03/12/24 0856   sodium chloride flush (NS) 0.9 % injection 3 mL, 3 mL, Intravenous, Q12H, Gold, Wayne E, PA-C, 3 mL at 03/12/24 0857   sodium chloride flush (NS) 0.9 % injection 3 mL, 3 mL, Intravenous, PRN, Gold, Wayne E, PA-C   sorbitol, magnesium hydroxide, mineral oil, glycerin (SMOG) enema, 300 mL, Rectal, Once, Barrett, Erin R, PA-C   tamsulosin (FLOMAX) capsule 0.4 mg, 0.4 mg, Oral, Daily, Gold, Wayne E, PA-C, 0.4 mg at 03/12/24 1610  Patients Current Diet:  Diet Order             Diet heart healthy/carb modified Room service appropriate? Yes with Assist; Fluid consistency: Thin  Diet effective now                  Precautions / Restrictions Precautions Precautions: Fall, Sternal Precaution Booklet Issued: No Precaution/Restrictions Comments: watch orthostatics Restrictions Weight Bearing Restrictions Per Provider Order: Yes (sternal precaution) RUE Weight Bearing Per Provider Order: Non weight bearing LUE Weight Bearing Per Provider Order: Non weight bearing Other Position/Activity Restrictions: sternal precautions   Has the patient had 2 or more falls or a fall with injury in the past year?No  Prior Activity Level Community (5-7x/wk): independent, working and driving  Prior Functional Level Prior Function Prior Level of Function : Independent/Modified Independent, Driving, Working/employed Mobility Comments: Independent without an AD ADLs Comments: Independent with ADLs and IADLs, drives, and works as an Social worker  Self Care: Did the patient need help bathing, dressing, using the toilet or eating?  Independent  Indoor  Mobility: Did the patient need assistance with walking from room to room (with or without device)? Independent  Stairs: Did the patient need assistance with internal or external stairs (with or without device)? Independent  Functional Cognition: Did the patient need help planning regular tasks such as shopping or remembering to take medications? Independent  Patient Information Are you of Hispanic, Latino/a,or Spanish origin?: A. No, not of Hispanic, Latino/a, or Spanish origin What is your race?: A. White Do you need or want an interpreter to communicate with a doctor or health care staff?: 0. No  Patient's Response To:  Health Literacy and Transportation Is the patient able to respond to health literacy and transportation needs?: Yes Health Literacy - How often do you need to have someone help you when you read instructions, pamphlets, or other written material from your doctor  or pharmacy?: Never In the past 12 months, has lack of transportation kept you from medical appointments or from getting medications?: No In the past 12 months, has lack of transportation kept you from meetings, work, or from getting things needed for daily living?: No  Home Assistive Devices / Equipment Home Equipment: None  Prior Device Use: Indicate devices/aids used by the patient prior to current illness, exacerbation or injury? None of the above  Current Functional Level Cognition  Orientation Level: Oriented X4    Extremity Assessment (includes Sensation/Coordination)  Upper Extremity Assessment: Right hand dominant, Overall WFL for tasks assessed (tested within sternal precautions)  Lower Extremity Assessment: Defer to PT evaluation    ADLs  Overall ADL's : Needs assistance/impaired Eating/Feeding: Independent, Sitting Grooming: Supervision/safety, Cueing for compensatory techniques, Sitting (adhering to sternal precautions) Upper Body Bathing: Contact guard assist, Minimal assistance, Cueing for  compensatory techniques, Sitting (adhering to sternal precautions) Lower Body Bathing: Supervison/ safety, Sitting/lateral leans, Maximal assistance, Sit to/from stand, +2 for physical assistance, +2 for safety/equipment, Cueing for compensatory techniques (adhering to sternal precautions) Upper Body Dressing : Minimal assistance, Cueing for compensatory techniques, Sitting (adhering to sternal precautions) Lower Body Dressing: Supervision/safety, Sitting/lateral leans, Maximal assistance, Sit to/from stand, Cueing for compensatory techniques, +2 for physical assistance, +2 for safety/equipment (adhering to sternal precautions) Toilet Transfer Details (indicate cue type and reason): deferred for pt/therapist safety due to pt with episode of orthostatic hypotension Toileting- Clothing Manipulation and Hygiene: Supervision/safety, Sitting/lateral lean, Cueing for compensatory techniques Functional mobility during ADLs:  (deferred for pt/therapist safety due to pt with episode of orthostatic hypotension) General ADL Comments: Pt with decreased activity tolerance and episode of orthostatic hypotension limiting functional level. Pt also with decreased knowledge of compensatory strategies and adaptive techniques to complete ADLs while adhering to sternal precautions.    Mobility  Overal bed mobility: Needs Assistance Bed Mobility: Rolling, Sit to Sidelying Rolling: Min assist Sidelying to sit: Min assist Sit to sidelying: Min assist General bed mobility comments: pt min A for sit to sidelying for LE management and proper sequencing cueing.    Transfers  Overall transfer level: Needs assistance Equipment used: None Transfers: Sit to/from Stand, Bed to chair/wheelchair/BSC Sit to Stand: Min assist, Contact guard assist, From elevated surface Bed to/from chair/wheelchair/BSC transfer type:: Step pivot Step pivot transfers: Min assist General transfer comment: pt stands with hands on knees and grabs  rollator handles once fully upright. Within 30 secs, pt reports increased dizziness and cold sweats. needing minA to boost from low chair and CGA from elevated bed. completed x7 in session    Ambulation / Gait / Stairs / Wheelchair Mobility  Ambulation/Gait Ambulation/Gait assistance: Contact guard assist Gait Distance (Feet): 5 Feet Assistive device: Rollator (4 wheels) Gait Pattern/deviations: Step-to pattern, Decreased stride length General Gait Details: pt able to manage small lateral steps from chair to bed, reports dizziness after arriving at bed Gait velocity: decreased Gait velocity interpretation: <1.31 ft/sec, indicative of household ambulator    Posture / Balance Dynamic Sitting Balance Sitting balance - Comments: pt sits with UEs across his chest, holding a heart pillow. No LOB or leaning Balance Overall balance assessment: Needs assistance Sitting-balance support: No upper extremity supported, Feet supported Sitting balance-Leahy Scale: Good Sitting balance - Comments: pt sits with UEs across his chest, holding a heart pillow. No LOB or leaning Standing balance support: During functional activity, No upper extremity supported, Bilateral upper extremity supported Standing balance-Leahy Scale: Fair Standing balance comment: pt initially stands without  UE support before grabbing handles of rollator. As his dizziness increases he begins to sway slightly in the frontal plane    Special needs/care consideration    Previous Home Environment  Living Arrangements: Alone Available Help at Discharge: Friend(s), Available PRN/intermittently Type of Home: House Home Layout: One level Home Access: Ramped entrance Bathroom Shower/Tub: Engineer, manufacturing systems: Standard Bathroom Accessibility: Yes How Accessible: Accessible via walker Home Care Services: No  Discharge Living Setting Plans for Discharge Living Setting: Patient's home, Alone Type of Home at Discharge:  House Discharge Home Layout: One level Discharge Home Access: Ramped entrance Discharge Bathroom Shower/Tub: Tub/shower unit Discharge Bathroom Toilet: Standard Discharge Bathroom Accessibility: Yes How Accessible: Accessible via walker Does the patient have any problems obtaining your medications?: No  Social/Family/Support Systems Patient Roles: Other (Comment) (employee) Contact Information: sister, Robynn Pane Anticipated Caregiver: friends intermittently Anticipated Industrial/product designer Information: see sister's contact information Ability/Limitations of Caregiver: lives out of state Caregiver Availability: Intermittent Discharge Plan Discussed with Primary Caregiver: Yes Is Caregiver In Agreement with Plan?: Yes Does Caregiver/Family have Issues with Lodging/Transportation while Pt is in Rehab?: No  Goals Patient/Family Goal for Rehab: Mod I PT/OT Expected length of stay: 7 days Pt/Family Agrees to Admission and willing to participate: Yes Program Orientation Provided & Reviewed with Pt/Caregiver Including Roles  & Responsibilities: Yes  Barriers to Discharge: Insurance for SNF coverage  Decrease burden of Care through IP rehab admission: n/a  Possible need for SNF placement upon discharge:not anticipated   Patient Condition: This patient's medical and functional status has not changed since the consult dated 03/11/24 in which the Rehabilitation Physician determined and documented that the patient was potentially appropriate for intensive rehabilitative care in an inpatient rehabilitation facility. Will admit to inpatient rehab today.   Preadmission Screen Completed By:  Clois Dupes, RN MSN, 03/12/2024 4:37 PM ______________________________________________________________________   Discussed status with Dr. Marland Kitchenon***at *** and received approval for admission today.  Admission Coordinator:  Clois Dupes, RN MSN time***/Date***

## 2024-03-11 NOTE — Telephone Encounter (Signed)
 Patient Product/process development scientist completed.    The patient is insured through [employer]. Patient has Medicare and is not eligible for a copay card, but may be able to apply for patient assistance or Medicare RX Payment Plan (Patient Must reach out to their plan, if eligible for payment plan), if available.    Ran test claim for Eliquis 5 mg and the current 30 day co-pay is $0.00.   This test claim was processed through [facility]- copay amounts may vary at other pharmacies due to pharmacy/plan contracts, or as the patient moves through the different stages of their insurance plan.     Roland Earl, CPHT Pharmacy Technician III Certified Patient Advocate [facility] Patient Advocate Team Direct Number: [phone]  Fax: [phone]

## 2024-03-12 ENCOUNTER — Encounter (HOSPITAL_COMMUNITY): Payer: Self-pay | Admitting: Thoracic Surgery (Cardiothoracic Vascular Surgery)

## 2024-03-12 LAB — BASIC METABOLIC PANEL WITH GFR
Anion gap: 13 (ref 5–15)
BUN: 20 mg/dL (ref 8–23)
CO2: 23 mmol/L (ref 22–32)
Calcium: 9.2 mg/dL (ref 8.9–10.3)
Chloride: 100 mmol/L (ref 98–111)
Creatinine, Ser: 1.24 mg/dL (ref 0.61–1.24)
GFR, Estimated: >60 mL/min (ref >60)
Glucose, Bld: 126 mg/dL — ABNORMAL HIGH (ref 70–99)
Potassium: 3.9 mmol/L (ref 3.5–5.1)
Sodium: 136 mmol/L (ref 135–145)

## 2024-03-12 NOTE — Progress Notes (Signed)
 Pt due for a void, encouraged patient to try x2. Unsuccessful. RN attempted to bladder scan patient, but he refused and said he knows his body he will pee in the morning. Also stated he wouldn't get a catheter either. Denies pressure or pain.

## 2024-03-12 NOTE — H&P (Shared)
 Physical Medicine and Rehabilitation Admission H&P    CC: Functional deficits   HPI:  Ryzen Deady is a 68 year old male with history of HTN, chronic left hip pain, TIA, substance abuse in the past, tobacco use, unstable angina who was found to have CAD and admitted on 03/04/24 for CABG X # by Dr. Cliffton Asters. Post op weaned without difficulty and pulmonary hygiene recommended for mucous plugging. He was found to have hematuria on foley removal and noted to have retention of 400 cc urine on 03/25 therefore Flomax added and foley replaced.   He developed SVT with HR in 140's post op  treated with amiodarone bolus and BB and transitioned to po amiodarone. He had orthostatic episode w/ syncope  with PT on 03/28 and BB decreased but with recurrence of SVT.  Dr. Royann Shivers consulted for input and recommended Eliquis with holding of Flomax as soon as able. Has required IV diuresis intermittently for fluid overload.  Foley removed on 03/30 and reported to be voiding without difficulty. He has had issues with constipation    Therapy has been working with patient who is limited by weakness, dizziness, orthostatic hypotension with drop in BP to 90's on standing, anxiety as well as sternal precautions. He was independent PTA and CIR recommended due to functional decline.    ROS   Past Medical History:  Diagnosis Date   Chronic left hip pain    Coronary artery disease    Hypertension    Pneumonia    As a teenager   Stroke [facility]) 2022   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    Past Surgical History:  Procedure Laterality Date   BACK SURGERY     Radiocartography T2-T12   CORONARY ARTERY BYPASS GRAFT N/A 03/04/2024   Procedure: CORONARY ARTERY BYPASS GRAFTING (CABG) TIMES THREE UTILIZING LEFT INTERNAL MAMMARY ARTERY AND ENDOSCOPIC VEIN HARVEST RIGHT GREATER SAPHENOUS VEIN;  Surgeon: Corliss Skains, MD;  Location: [facility] OR;   Service: Open Heart Surgery;  Laterality: N/A;   KNEE ARTHROSCOPY Bilateral    LEFT HEART CATH AND CORONARY ANGIOGRAPHY N/A 11/28/2023   Procedure: LEFT HEART CATH AND CORONARY ANGIOGRAPHY;  Surgeon: Swaziland, Peter M, MD;  Location: [facility] INVASIVE CV LAB;  Service: Cardiovascular;  Laterality: N/A;   PILONIDAL CYST EXCISION     In patients 20s   TEE WITHOUT CARDIOVERSION N/A 03/04/2024   Procedure: ECHOCARDIOGRAM, TRANSESOPHAGEAL;  Surgeon: Corliss Skains, MD;  Location: [facility] OR;  Service: Open Heart Surgery;  Laterality: N/A;    Family History  Problem Relation Age of Onset   Cancer Mother    Hypertension Mother    Heart failure Mother    Cancer Father    Hypertension Father    Heart failure Father    Heart disease Father     Social History:  Lives alone. Disabled due to back issues.    Is a Engineer, water?   He reports that he has been smoking cigarettes over 50 pack years. He has never used smokeless tobacco. He reports that he does not currently use drugs after having used the following drugs: "Crack" cocaine and Marijuana. He reports that he does not drink alcohol.   Allergies: No Known Allergies   Medications Prior to Admission  Medication Sig Dispense Refill   amLODipine (NORVASC) 5 MG tablet Take 1 tablet (5 mg total) by mouth daily. 30 tablet 0  aspirin 81 MG chewable tablet Chew 81 mg by mouth daily.     atorvastatin (LIPITOR) 20 MG tablet Take 20 mg by mouth daily.     HYDROcodone-acetaminophen (NORCO) 7.5-325 MG tablet Take 1 tablet by mouth every 6 (six) hours as needed for moderate pain (pain score 4-6).     isosorbide mononitrate (IMDUR) 30 MG 24 hr tablet Take 1 tablet (30 mg total) by mouth daily. 30 tablet 11   metoprolol succinate (TOPROL XL) 25 MG 24 hr tablet Take 1 tablet (25 mg total) by mouth daily. 30 tablet 11   nitroGLYCERIN (NITROSTAT) 0.4 MG SL tablet DISSOLVE 1 TABLET UNDER THE TONGUE EVERY 5 MINUTES AS NEEDED FOR CHEST PAIN. DO NOT EXCEED A  TOTAL OF 3 DOSES IN 15 MINUTES. 25 tablet 2   Vitamin D, Ergocalciferol, (DRISDOL) 1.25 MG (50000 UNIT) CAPS capsule Take 50,000 Units by mouth once a week.     aspirin EC 81 MG tablet Take 1 tablet (81 mg total) by mouth daily. Swallow whole. (Patient not taking: Reported on 02/23/2024)        Home: Home Living Family/patient expects to be discharged to:: Private residence Living Arrangements: Alone Available Help at Discharge: Friend(s), Available PRN/intermittently Type of Home: House Home Access: Ramped entrance Home Layout: One level Bathroom Shower/Tub: Engineer, manufacturing systems: Standard Bathroom Accessibility: Yes Home Equipment: None   Functional History: Prior Function Prior Level of Function : Independent/Modified Independent, Driving, Working/employed Mobility Comments: Independent without an AD ADLs Comments: Independent with ADLs and IADLs, drives, and works as an Arts development officer Status:  Mobility: Bed Mobility Overal bed mobility: Needs Assistance Bed Mobility: Rolling, Sit to Sidelying Rolling: Min assist Sidelying to sit: Min assist Sit to sidelying: Min assist General bed mobility comments: pt min A for sit to sidelying for LE management and proper sequencing cueing. Transfers Overall transfer level: Needs assistance Equipment used: None Transfers: Sit to/from Stand, Bed to chair/wheelchair/BSC Sit to Stand: Min assist, Contact guard assist, From elevated surface Bed to/from chair/wheelchair/BSC transfer type:: Step pivot Step pivot transfers: Min assist General transfer comment: pt stands with hands on knees and grabs rollator handles once fully upright. Within 30 secs, pt reports increased dizziness and cold sweats. needing minA to boost from low chair and CGA from elevated bed. completed x7 in session Ambulation/Gait Ambulation/Gait assistance: Contact guard assist Gait Distance (Feet): 5 Feet Assistive device: Rollator  (4 wheels) Gait Pattern/deviations: Step-to pattern, Decreased stride length General Gait Details: pt able to manage small lateral steps from chair to bed, reports dizziness after arriving at bed Gait velocity: decreased Gait velocity interpretation: <1.31 ft/sec, indicative of household ambulator    ADL: ADL Overall ADL's : Needs assistance/impaired Eating/Feeding: Independent, Sitting Grooming: Supervision/safety, Cueing for compensatory techniques, Sitting (adhering to sternal precautions) Upper Body Bathing: Contact guard assist, Minimal assistance, Cueing for compensatory techniques, Sitting (adhering to sternal precautions) Lower Body Bathing: Supervison/ safety, Sitting/lateral leans, Maximal assistance, Sit to/from stand, +2 for physical assistance, +2 for safety/equipment, Cueing for compensatory techniques (adhering to sternal precautions) Upper Body Dressing : Minimal assistance, Cueing for compensatory techniques, Sitting (adhering to sternal precautions) Lower Body Dressing: Supervision/safety, Sitting/lateral leans, Maximal assistance, Sit to/from stand, Cueing for compensatory techniques, +2 for physical assistance, +2 for safety/equipment (adhering to sternal precautions) Toilet Transfer Details (indicate cue type and reason): deferred for pt/therapist safety due to pt with episode of orthostatic hypotension Toileting- Clothing Manipulation and Hygiene: Supervision/safety, Sitting/lateral lean, Cueing for compensatory techniques Functional  mobility during ADLs:  (deferred for pt/therapist safety due to pt with episode of orthostatic hypotension) General ADL Comments: Pt with decreased activity tolerance and episode of orthostatic hypotension limiting functional level. Pt also with decreased knowledge of compensatory strategies and adaptive techniques to complete ADLs while adhering to sternal precautions.  Cognition: Cognition Orientation Level: Oriented X4 Cognition Arousal:  Alert Behavior During Therapy: WFL for tasks assessed/performed   Blood pressure 111/67, pulse 79, temperature 98.2 F (36.8 C), temperature source Oral, resp. rate 17, height 6\' 3"  (1.905 m), weight 97.2 kg, SpO2 91%. Physical Exam  Results for orders placed or performed during the hospital encounter of 03/04/24 (from the past 48 hours)  Basic metabolic panel     Status: Abnormal   Collection Time: 03/11/24  1:45 AM  Result Value Ref Range   Sodium 134 (L) 135 - 145 mmol/L   Potassium 4.0 3.5 - 5.1 mmol/L   Chloride 101 98 - 111 mmol/L   CO2 22 22 - 32 mmol/L   Glucose, Bld 115 (H) 70 - 99 mg/dL    Comment: Glucose reference range applies only to samples taken after fasting for at least 8 hours.   BUN 16 8 - 23 mg/dL   Creatinine, Ser 1.61 0.61 - 1.24 mg/dL   Calcium 8.8 (L) 8.9 - 10.3 mg/dL   GFR, Estimated >09 >60 mL/min    Comment: (NOTE) Calculated using the CKD-EPI Creatinine Equation (2021)    Anion gap 11 5 - 15    Comment: Performed at [facility], [address], [place], [place] [address]  Basic metabolic panel     Status: Abnormal   Collection Time: 03/12/24  5:01 AM  Result Value Ref Range   Sodium 136 135 - 145 mmol/L   Potassium 3.9 3.5 - 5.1 mmol/L   Chloride 100 98 - 111 mmol/L   CO2 23 22 - 32 mmol/L   Glucose, Bld 126 (H) 70 - 99 mg/dL    Comment: Glucose reference range applies only to samples taken after fasting for at least 8 hours.   BUN 20 8 - 23 mg/dL   Creatinine, Ser 8.11 0.61 - 1.24 mg/dL   Calcium 9.2 8.9 - 91.4 mg/dL   GFR, Estimated >78 >29 mL/min    Comment: (NOTE) Calculated using the CKD-EPI Creatinine Equation (2021)    Anion gap 13 5 - 15    Comment: Performed at [facility], [address], [place], [place] [address]   No results found.    Blood pressure 111/67, pulse 79, temperature 98.2 F (36.8 C), temperature source Oral, resp. rate 17, height 6\' 3"  (1.905 m), weight 97.2 kg, SpO2 91%.  Medical Problem List  and Plan: 1. Functional deficits secondary to ***  -patient may *** shower  -ELOS/Goals: *** 2.  Antithrombotics: -DVT/anticoagulation:  Pharmaceutical: Eliquis  -antiplatelet therapy: ASA 3. Pain Management: Oxycodone prn for severe pain.  4. Mood/Behavior/Sleep: LCSW to follow for evaluation and support.   -antipsychotic agents: N/A 5. Neuropsych/cognition: This patient *** capable of making decisions on *** own behalf. 6. Skin/Wound Care: Monitor wound for healing.  7. Fluids/Electrolytes/Nutrition: ***  A fib: Monitor HR TID--continue Amiodarone 400 mg bid  Orthostatic hypotension: Encourage fluid intake.   Urinary retention: On Flomax.  --Foley discontinued on 03/30. Refused bladder scans   --has had rise in SCr from 0.9-->  Acute blood loss anemia: Recheck CBC in am. .   Constipation: On dulcolax tabs daily and SMOG enema ordered  04/01  Tobacco abuse: Continue nicotine patch.   Impaired fasting glucose: Hgb A1C- 5.8. Continue CM diet.     ***  Jacquelynn Cree, PA-C 03/12/2024

## 2024-03-12 NOTE — Progress Notes (Signed)
 Inpatient Rehabilitation Admissions Coordinator   I met with patient at bedside and reviewed estimated cost of care if [facility] approves CIR. I await their determination. Patient states he does not want SNF if CIR is denied.   Ottie Glazier, RN, MSN Rehab Admissions Coordinator [phone] 03/12/2024 10:56 AM

## 2024-03-12 NOTE — Progress Notes (Signed)
 Mobility Specialist Progress Note:   03/12/24 1140  Mobility  Activity Transferred from bed to chair  Level of Assistance Minimal assist, patient does 75% or more  Assistive Device Front wheel walker  Distance Ambulated (ft) 5 ft  RUE Weight Bearing Per Provider Order NWB  LUE Weight Bearing Per Provider Order NWB  Activity Response Tolerated well  Mobility Referral Yes  Mobility visit 1 Mobility  Mobility Specialist Start Time (ACUTE ONLY) 1140  Mobility Specialist Stop Time (ACUTE ONLY) 1145  Mobility Specialist Time Calculation (min) (ACUTE ONLY) 5 min   Pt received in bed, agreeable to mobility session. Performed orthostatics and transferred B>C via RW. MinA required to stand, MinG to pivot to chair. Unable to tolerate standing 2 minutes d/t dizziness/ anxiety about passing out. Tolerated well, c/o mild dizziness when changing positions. Left pt in chair, all needs met, see BP below.   Supine: 116/76 (89) EOB: 110/70 (79) Standing 1 min: 71/58 (64) Post Mobility (Sitting in Chair):  113/72 (85)  Feliciana Rossetti Mobility Specialist Please contact via Special educational needs teacher or  Rehab office at [phone]

## 2024-03-12 NOTE — Progress Notes (Signed)
 Inpatient Rehabilitation Admissions Coordinator   I have received insurance approval for CIR from [facility]. Patient is aware. I am hopeful for a bed Wednesday.Acute team and TOC made aware.  Ottie Glazier, RN, MSN Rehab Admissions Coordinator [phone] 03/12/2024 4:26 PM

## 2024-03-12 NOTE — Progress Notes (Addendum)
 8 Days Post-Op Procedure(s) (LRB): CORONARY ARTERY BYPASS GRAFTING (CABG) TIMES THREE UTILIZING LEFT INTERNAL MAMMARY ARTERY AND ENDOSCOPIC VEIN HARVEST RIGHT GREATER SAPHENOUS VEIN (N/A) ECHOCARDIOGRAM, TRANSESOPHAGEAL (N/A) Subjective: Now able to void Feels well Objective: Vital signs in last 24 hours: Temp:  [98 F (36.7 C)-98.5 F (36.9 C)] 98.5 F (36.9 C) (04/01 0730) Pulse Rate:  [76-90] 76 (04/01 0730) Cardiac Rhythm: Normal sinus rhythm (03/31 2100) Resp:  [15-20] 16 (04/01 0730) BP: (109-134)/(69-86) 119/79 (04/01 0730) SpO2:  [90 %-96 %] 94 % (04/01 0730) Weight:  [97.2 kg] 97.2 kg (04/01 0414)  Hemodynamic parameters for last 24 hours:    Intake/Output from previous day: 03/31 0701 - 04/01 0700 In: 720 [P.O.:720] Out: 300 [Urine:300] Intake/Output this shift: No intake/output data recorded.  General appearance: alert, cooperative, and no distress Heart: regular rate and rhythm Lungs: clear to auscultation bilaterally Abdomen: benign Extremities: no edema Wound: incis healing well  Lab Results: No results for input(s): "WBC", "HGB", "HCT", "PLT" in the last 72 hours. BMET:  Recent Labs    03/11/24 0145 03/12/24 0501  NA 134* 136  K 4.0 3.9  CL 101 100  CO2 22 23  GLUCOSE 115* 126*  BUN 16 20  CREATININE 1.03 1.24  CALCIUM 8.8* 9.2    PT/INR: No results for input(s): "LABPROT", "INR" in the last 72 hours. ABG    Component Value Date/Time   PHART 7.324 (L) 03/04/2024 1927   HCO3 21.1 03/04/2024 1927   TCO2 22 03/04/2024 1927   ACIDBASEDEF 4.0 (H) 03/04/2024 1927   O2SAT 95 03/04/2024 1927   CBG (last 3)  No results for input(s): "GLUCAP" in the last 72 hours.  Meds Scheduled Meds:  amiodarone  400 mg Oral BID   apixaban  5 mg Oral BID   atorvastatin  80 mg Oral Daily   bisacodyl  10 mg Oral Daily   Or   bisacodyl  10 mg Rectal Daily   Chlorhexidine Gluconate Cloth  6 each Topical Daily   metoprolol succinate  25 mg Oral QHS    nicotine  14 mg Transdermal Daily   pantoprazole  40 mg Oral Daily   sodium chloride flush  3 mL Intravenous Q12H   SMOG  300 mL Rectal Once   tamsulosin  0.4 mg Oral Daily   Continuous Infusions:  amiodarone Stopped (03/10/24 1015)   PRN Meds:.ipratropium-albuterol, ondansetron (ZOFRAN) IV, oxyCODONE, sodium chloride flush  Xrays No results found.  Assessment/Plan: S/P Procedure(s) (LRB): CORONARY ARTERY BYPASS GRAFTING (CABG) TIMES THREE UTILIZING LEFT INTERNAL MAMMARY ARTERY AND ENDOSCOPIC VEIN HARVEST RIGHT GREATER SAPHENOUS VEIN (N/A) ECHOCARDIOGRAM, TRANSESOPHAGEAL (N/A) POD#8  1 afeb, VSS, sinus rhythm 2 O2 sats ok on RA 3 voiding- unmeasured 4 creat bumped a little to 1.24, may be related to some of his voiding difficulties 5 cont therapies and pulm hygiene, can go to CIR when authorization approval complete          LOS: 8 days    Rowe Clack PA-C Pager [phone] 03/12/2024  Patient seen and examined, d/w [facility] CIR once insurance approved  Salvatore Decent. Dorris Fetch, MD [facility] [phone]

## 2024-03-12 NOTE — Progress Notes (Signed)
 Attempted to bladder scan patient again, pt refused.Stating again, he knows his body.  Educated on the benefits of a bladder scanner. Continues to deny any pain.

## 2024-03-13 ENCOUNTER — Encounter (HOSPITAL_COMMUNITY): Payer: Self-pay | Admitting: Physical Medicine and Rehabilitation

## 2024-03-13 ENCOUNTER — Inpatient Hospital Stay (HOSPITAL_COMMUNITY)
Admission: AD | Admit: 2024-03-13 | Discharge: 2024-03-21 | DRG: 945 | Disposition: A | Discharge status: Home / Self Care | Source: Intra-hospital | Attending: Physical Medicine and Rehabilitation | Admitting: Physical Medicine and Rehabilitation

## 2024-03-13 ENCOUNTER — Encounter (HOSPITAL_COMMUNITY): Payer: Self-pay | Admitting: Thoracic Surgery (Cardiothoracic Vascular Surgery)

## 2024-03-13 ENCOUNTER — Other Ambulatory Visit: Payer: Self-pay

## 2024-03-13 DIAGNOSIS — G8929 Other chronic pain: Secondary | ICD-10-CM | POA: Diagnosis present

## 2024-03-13 DIAGNOSIS — D62 Acute posthemorrhagic anemia: Secondary | ICD-10-CM | POA: Diagnosis present

## 2024-03-13 DIAGNOSIS — Z7901 Long term (current) use of anticoagulants: Secondary | ICD-10-CM

## 2024-03-13 DIAGNOSIS — Z8249 Family history of ischemic heart disease and other diseases of the circulatory system: Secondary | ICD-10-CM | POA: Diagnosis not present

## 2024-03-13 DIAGNOSIS — Z7982 Long term (current) use of aspirin: Secondary | ICD-10-CM

## 2024-03-13 DIAGNOSIS — I10 Essential (primary) hypertension: Secondary | ICD-10-CM | POA: Diagnosis present

## 2024-03-13 DIAGNOSIS — Z720 Tobacco use: Secondary | ICD-10-CM | POA: Diagnosis not present

## 2024-03-13 DIAGNOSIS — K5900 Constipation, unspecified: Secondary | ICD-10-CM | POA: Diagnosis present

## 2024-03-13 DIAGNOSIS — R5381 Other malaise: Principal | ICD-10-CM | POA: Diagnosis present

## 2024-03-13 DIAGNOSIS — Z8679 Personal history of other diseases of the circulatory system: Secondary | ICD-10-CM

## 2024-03-13 DIAGNOSIS — E7800 Pure hypercholesterolemia, unspecified: Secondary | ICD-10-CM | POA: Diagnosis present

## 2024-03-13 DIAGNOSIS — Z716 Tobacco abuse counseling: Secondary | ICD-10-CM | POA: Diagnosis not present

## 2024-03-13 DIAGNOSIS — R7309 Other abnormal glucose: Secondary | ICD-10-CM | POA: Diagnosis not present

## 2024-03-13 DIAGNOSIS — I471 Supraventricular tachycardia, unspecified: Secondary | ICD-10-CM | POA: Diagnosis present

## 2024-03-13 DIAGNOSIS — Z951 Presence of aortocoronary bypass graft: Secondary | ICD-10-CM

## 2024-03-13 DIAGNOSIS — I4891 Unspecified atrial fibrillation: Secondary | ICD-10-CM | POA: Diagnosis present

## 2024-03-13 DIAGNOSIS — R001 Bradycardia, unspecified: Secondary | ICD-10-CM | POA: Diagnosis not present

## 2024-03-13 DIAGNOSIS — M25552 Pain in left hip: Secondary | ICD-10-CM | POA: Diagnosis present

## 2024-03-13 DIAGNOSIS — F17210 Nicotine dependence, cigarettes, uncomplicated: Secondary | ICD-10-CM | POA: Diagnosis present

## 2024-03-13 DIAGNOSIS — M546 Pain in thoracic spine: Secondary | ICD-10-CM | POA: Diagnosis present

## 2024-03-13 DIAGNOSIS — I2510 Atherosclerotic heart disease of native coronary artery without angina pectoris: Secondary | ICD-10-CM | POA: Diagnosis present

## 2024-03-13 DIAGNOSIS — R339 Retention of urine, unspecified: Secondary | ICD-10-CM | POA: Insufficient documentation

## 2024-03-13 DIAGNOSIS — D72829 Elevated white blood cell count, unspecified: Secondary | ICD-10-CM | POA: Insufficient documentation

## 2024-03-13 DIAGNOSIS — M4854XS Collapsed vertebra, not elsewhere classified, thoracic region, sequela of fracture: Secondary | ICD-10-CM | POA: Diagnosis present

## 2024-03-13 DIAGNOSIS — Z8673 Personal history of transient ischemic attack (TIA), and cerebral infarction without residual deficits: Secondary | ICD-10-CM

## 2024-03-13 DIAGNOSIS — Z79899 Other long term (current) drug therapy: Secondary | ICD-10-CM

## 2024-03-13 DIAGNOSIS — R7301 Impaired fasting glucose: Secondary | ICD-10-CM | POA: Diagnosis present

## 2024-03-13 DIAGNOSIS — I951 Orthostatic hypotension: Secondary | ICD-10-CM | POA: Diagnosis present

## 2024-03-13 LAB — BASIC METABOLIC PANEL WITH GFR
Anion gap: 9 (ref 5–15)
BUN: 17 mg/dL (ref 8–23)
CO2: 24 mmol/L (ref 22–32)
Calcium: 8.8 mg/dL — ABNORMAL LOW (ref 8.9–10.3)
Chloride: 100 mmol/L (ref 98–111)
Creatinine, Ser: 1.25 mg/dL — ABNORMAL HIGH (ref 0.61–1.24)
GFR, Estimated: >60 mL/min (ref >60)
Glucose, Bld: 106 mg/dL — ABNORMAL HIGH (ref 70–99)
Potassium: 4 mmol/L (ref 3.5–5.1)
Sodium: 133 mmol/L — ABNORMAL LOW (ref 135–145)

## 2024-03-13 MED ORDER — IPRATROPIUM-ALBUTEROL 0.5-2.5 (3) MG/3ML IN SOLN: 3.0000 mL | RESPIRATORY_TRACT | Status: DC | PRN

## 2024-03-13 MED ORDER — ALUM & MAG HYDROXIDE-SIMETH 200-200-20 MG/5ML PO SUSP: 30.0000 mL | ORAL | Status: DC | PRN

## 2024-03-13 MED ORDER — APIXABAN 5 MG PO TABS: 5.0000 mg | ORAL_TABLET | Freq: Two times a day (BID) | ORAL | Status: DC

## 2024-03-13 MED ORDER — MIDODRINE HCL 5 MG PO TABS: 5.0000 mg | ORAL_TABLET | Freq: Three times a day (TID) | ORAL | Status: DC

## 2024-03-13 MED ORDER — METOPROLOL SUCCINATE ER 25 MG PO TB24
25.0000 mg | ORAL_TABLET | Freq: Every day | ORAL | Status: DC
  Administered 2024-03-13 – 2024-03-14 (×2): 25 mg via ORAL
  Filled 2024-03-13 (×3): qty 1

## 2024-03-13 MED ORDER — DIPHENHYDRAMINE HCL 25 MG PO CAPS
25.0000 mg | ORAL_CAPSULE | Freq: Four times a day (QID) | ORAL | Status: DC | PRN
  Administered 2024-03-20: 25 mg via ORAL
  Filled 2024-03-13: qty 1

## 2024-03-13 MED ORDER — NICOTINE 14 MG/24HR TD PT24: 14.0000 mg | MEDICATED_PATCH | Freq: Every day | TRANSDERMAL | Status: DC

## 2024-03-13 MED ORDER — MIDODRINE HCL 5 MG PO TABS
5.0000 mg | ORAL_TABLET | Freq: Three times a day (TID) | ORAL | Status: DC
  Administered 2024-03-13: 5 mg via ORAL
  Filled 2024-03-13: qty 1

## 2024-03-13 MED ORDER — MIDODRINE HCL 5 MG PO TABS
5.0000 mg | ORAL_TABLET | Freq: Three times a day (TID) | ORAL | Status: DC
  Administered 2024-03-13 – 2024-03-19 (×17): 5 mg via ORAL
  Filled 2024-03-13 (×17): qty 1

## 2024-03-13 MED ORDER — NICOTINE 14 MG/24HR TD PT24
14.0000 mg | MEDICATED_PATCH | Freq: Every day | TRANSDERMAL | Status: DC
  Administered 2024-03-14 – 2024-03-21 (×8): 14 mg via TRANSDERMAL
  Filled 2024-03-13 (×8): qty 1

## 2024-03-13 MED ORDER — PANTOPRAZOLE SODIUM 40 MG PO TBEC
40.0000 mg | DELAYED_RELEASE_TABLET | Freq: Every day | ORAL | Status: DC
Start: 2024-03-14 — End: 2024-03-21
  Administered 2024-03-14 – 2024-03-21 (×8): 40 mg via ORAL
  Filled 2024-03-13 (×8): qty 1

## 2024-03-13 MED ORDER — LIDOCAINE HCL URETHRAL/MUCOSAL 2 % EX GEL: CUTANEOUS | Status: DC | PRN

## 2024-03-13 MED ORDER — AMIODARONE HCL 200 MG PO TABS
400.0000 mg | ORAL_TABLET | Freq: Two times a day (BID) | ORAL | Status: DC
  Administered 2024-03-13 – 2024-03-16 (×6): 400 mg via ORAL
  Filled 2024-03-13 (×6): qty 2

## 2024-03-13 MED ORDER — ASPIRIN 81 MG PO TBEC: 81.0000 mg | DELAYED_RELEASE_TABLET | Freq: Every day | ORAL | Status: DC

## 2024-03-13 MED ORDER — GUAIFENESIN-DM 100-10 MG/5ML PO SYRP: 5.0000 mL | ORAL_SOLUTION | Freq: Four times a day (QID) | ORAL | Status: DC | PRN

## 2024-03-13 MED ORDER — PROCHLORPERAZINE EDISYLATE 10 MG/2ML IJ SOLN: 5.0000 mg | Freq: Four times a day (QID) | INTRAMUSCULAR | Status: DC | PRN

## 2024-03-13 MED ORDER — ATORVASTATIN CALCIUM 80 MG PO TABS
80.0000 mg | ORAL_TABLET | Freq: Every day | ORAL | Status: DC
  Administered 2024-03-14 – 2024-03-21 (×8): 80 mg via ORAL
  Filled 2024-03-13 (×8): qty 1

## 2024-03-13 MED ORDER — TAMSULOSIN HCL 0.4 MG PO CAPS
0.4000 mg | ORAL_CAPSULE | Freq: Every day | ORAL | Status: DC
  Administered 2024-03-14 – 2024-03-17 (×4): 0.4 mg via ORAL
  Filled 2024-03-13 (×4): qty 1

## 2024-03-13 MED ORDER — ASPIRIN 81 MG PO TBEC
81.0000 mg | DELAYED_RELEASE_TABLET | Freq: Every day | ORAL | Status: DC
  Administered 2024-03-14 – 2024-03-21 (×8): 81 mg via ORAL
  Filled 2024-03-13 (×8): qty 1

## 2024-03-13 MED ORDER — APIXABAN 5 MG PO TABS
5.0000 mg | ORAL_TABLET | Freq: Two times a day (BID) | ORAL | Status: DC
  Administered 2024-03-13 – 2024-03-21 (×16): 5 mg via ORAL
  Filled 2024-03-13 (×16): qty 1

## 2024-03-13 MED ORDER — FLEET ENEMA RE ENEM: 1.0000 | ENEMA | Freq: Once | RECTAL | Status: DC | PRN

## 2024-03-13 MED ORDER — OXYCODONE HCL 5 MG PO TABS
5.0000 mg | ORAL_TABLET | ORAL | Status: DC | PRN
  Administered 2024-03-13 – 2024-03-16 (×6): 10 mg via ORAL
  Administered 2024-03-17 – 2024-03-18 (×2): 5 mg via ORAL
  Administered 2024-03-18: 10 mg via ORAL
  Administered 2024-03-19: 5 mg via ORAL
  Administered 2024-03-21: 10 mg via ORAL
  Filled 2024-03-13 (×3): qty 2
  Filled 2024-03-13: qty 1
  Filled 2024-03-13: qty 2
  Filled 2024-03-13: qty 1
  Filled 2024-03-13: qty 2
  Filled 2024-03-13: qty 1
  Filled 2024-03-13 (×3): qty 2

## 2024-03-13 MED ORDER — PROCHLORPERAZINE MALEATE 5 MG PO TABS: 5.0000 mg | ORAL_TABLET | Freq: Four times a day (QID) | ORAL | Status: DC | PRN

## 2024-03-13 MED ORDER — TAMSULOSIN HCL 0.4 MG PO CAPS: 0.4000 mg | ORAL_CAPSULE | Freq: Every day | ORAL | Status: DC

## 2024-03-13 MED ORDER — ASPIRIN 81 MG PO TBEC
81.0000 mg | DELAYED_RELEASE_TABLET | Freq: Every day | ORAL | Status: DC
  Administered 2024-03-13: 81 mg via ORAL
  Filled 2024-03-13: qty 1

## 2024-03-13 MED ORDER — BISACODYL 5 MG PO TBEC
10.0000 mg | DELAYED_RELEASE_TABLET | Freq: Every day | ORAL | Status: DC
Start: 2024-03-14 — End: 2024-03-21
  Administered 2024-03-14 – 2024-03-20 (×7): 10 mg via ORAL
  Filled 2024-03-13 (×8): qty 2

## 2024-03-13 MED ORDER — BISACODYL 10 MG RE SUPP
10.0000 mg | Freq: Every day | RECTAL | Status: DC
  Filled 2024-03-13: qty 1

## 2024-03-13 MED ORDER — PROCHLORPERAZINE 25 MG RE SUPP: 12.5000 mg | Freq: Four times a day (QID) | RECTAL | Status: DC | PRN

## 2024-03-13 MED ORDER — AMIODARONE HCL 200 MG PO TABS: 400.0000 mg | ORAL_TABLET | Freq: Two times a day (BID) | ORAL | Status: DC

## 2024-03-13 MED ORDER — BISACODYL 10 MG RE SUPP: 10.0000 mg | Freq: Every day | RECTAL | Status: DC | PRN

## 2024-03-13 MED ORDER — ACETAMINOPHEN 325 MG PO TABS: 325.0000 mg | ORAL_TABLET | ORAL | Status: DC | PRN

## 2024-03-13 MED ORDER — MELATONIN 5 MG PO TABS
5.0000 mg | ORAL_TABLET | Freq: Every evening | ORAL | Status: DC | PRN
  Administered 2024-03-16 – 2024-03-20 (×5): 5 mg via ORAL
  Filled 2024-03-13 (×5): qty 1

## 2024-03-13 MED ORDER — ATORVASTATIN CALCIUM 80 MG PO TABS: 80.0000 mg | ORAL_TABLET | Freq: Every day | ORAL | Status: DC

## 2024-03-13 NOTE — TOC Transition Note (Signed)
 Transition of Care (TOC) - Discharge Note Donn Pierini RN, BSN Transitions of Care Unit 4E- RN Case Manager See Treatment Team for direct phone #   Patient Details  Name: Dave Williams MRN: [id number] Date of Birth: 1956-02-26  Transition of Care [facility]) CM/SW Contact:  Darrold Span, RN Phone Number: 03/13/2024, 10:01 AM   Clinical Narrative:    Pt has received insurance auth for [facility] INPT rehab and liaison has notified team that bed is available for admit today.   Pt stable for transition to [facility] INPT rehab, and will transition today to bed when available.  CM was notified by [facility] liaison that TCTS office had made referral to them for [facility] needs- liaison notified that pt going to CIR and liaison to follow for any further [facility] needs post rehab stay.   No further TOC needs or interventions noted.    Final next level of care: IP Rehab Facility Barriers to Discharge: Barriers Resolved   Patient Goals and CMS Choice Patient states their goals for this hospitalization and ongoing recovery are:: plan to retrun home CMS Medicare.gov Compare Post Acute Care list provided to:: Patient Choice offered to / list presented to : Patient      Discharge Placement               [facility] INPT rehab        Discharge Plan and Services Additional resources added to the After Visit Summary for   In-house Referral: NA Discharge Planning Services: CM Consult Post Acute Care Choice: IP Rehab          DME Arranged: N/A DME Agency: NA         HH Agency: [facility]     Representative spoke with at [facility] Agency: Aggie Cosier  Social Drivers of Health (SDOH) Interventions SDOH Screenings   Food Insecurity: No Food Insecurity (03/05/2024)  Housing: Low Risk  (03/05/2024)  Transportation Needs: No Transportation Needs (03/05/2024)  Utilities: Not At Risk (03/05/2024)  Social Connections: Moderately Integrated (03/05/2024)  Tobacco Use: High Risk (03/12/2024)      Readmission Risk Interventions    03/13/2024   10:01 AM  Readmission Risk Prevention Plan  Post Dischage Appt Complete  Medication Screening Complete  Transportation Screening Complete

## 2024-03-13 NOTE — Progress Notes (Signed)
 Mobility Specialist Progress Note:   03/13/24 0940  Mobility  Activity Stood at bedside;Dangled on edge of bed  Level of Assistance Minimal assist, patient does 75% or more  Assistive Device Front wheel walker  RUE Weight Bearing Per Provider Order NWB  LUE Weight Bearing Per Provider Order NWB  Activity Response Tolerated well  Mobility Referral Yes  Mobility visit 1 Mobility  Mobility Specialist Start Time (ACUTE ONLY) 0940  Mobility Specialist Stop Time (ACUTE ONLY) 0949  Mobility Specialist Time Calculation (min) (ACUTE ONLY) 9 min   Pt received in bed, agreeable to mobility session. Performed orthostatics. Deferred ambulation/ transfer to chair d/t CIR admission. See BP below. Tolerated well, denies dizziness. Returned pt supine in bed with all needs met, CIR RN in room.   Supine:110/61 (76) EOB: 93/63 (74) Stand 1 min: 111/62 (78) Stand 2 min: 105/67 (77)   Feliciana Rossetti Mobility Specialist Please contact via Special educational needs teacher or  Rehab office at [phone]

## 2024-03-13 NOTE — Progress Notes (Signed)
 Inpatient Rehabilitation Admission Medication Review by a Pharmacist  A complete drug regimen review was completed for this patient to identify any potential clinically significant medication issues.  High Risk Drug Classes Is patient taking? Indication by Medication  Antipsychotic Yes Compazine prn N/V  Anticoagulant Yes Apixaban - Afib  Antibiotic No   Opioid Yes Oxycodone prn pain  Antiplatelet No   Hypoglycemics/insulin No   Vasoactive Medication Yes Amiodarone - Afib  Metoprolol -  Afib Midodrine - low BP Tamsulosin - BPH  Chemotherapy No   Other Yes Pantoprazole - Reflux  Atorvastatin - HLD Duo-neb prn SOB Lidocaine prn cath care     Type of Medication Issue Identified Description of Issue Recommendation(s)  Drug Interaction(s) (clinically significant)     Duplicate Therapy     Allergy     No Medication Administration End Date     Incorrect Dose     Additional Drug Therapy Needed     Significant med changes from prior encounter (inform family/care partners about these prior to discharge).    Other       Clinically significant medication issues were identified that warrant physician communication and completion of prescribed/recommended actions by midnight of the next day:  No  Name of provider notified for urgent issues identified:   Provider Method of Notification:     Pharmacist comments: None  Time spent performing this drug regimen review (minutes):  20 minutes  Thank you Okey Regal, PharmD

## 2024-03-13 NOTE — Care Management Important Message (Signed)
 Important Message  Patient Details  Name: Dave Williams MRN: [id number] Date of Birth: 25-Jun-1956   Important Message Given:  Yes - Medicare IM     Renie Ora 03/13/2024, 10:30 AM

## 2024-03-13 NOTE — Progress Notes (Addendum)
 9 Days Post-Op Procedure(s) (LRB): CORONARY ARTERY BYPASS GRAFTING (CABG) TIMES THREE UTILIZING LEFT INTERNAL MAMMARY ARTERY AND ENDOSCOPIC VEIN HARVEST RIGHT GREATER SAPHENOUS VEIN (N/A) ECHOCARDIOGRAM, TRANSESOPHAGEAL (N/A) Subjective: Still some orthostasis and intermit dizziness when getting out of bed  Objective: Vital signs in last 24 hours: Temp:  [97.7 F (36.5 C)-98.5 F (36.9 C)] 98.1 F (36.7 C) (04/02 0445) Pulse Rate:  [76-79] 78 (04/01 1937) Cardiac Rhythm: Normal sinus rhythm (04/02 0325) Resp:  [16-21] 16 (04/02 0445) BP: (108-119)/(67-79) 110/70 (04/02 0445) SpO2:  [91 %-96 %] 91 % (04/02 0445) Weight:  [101.6 kg] 101.6 kg (04/02 0445)  Hemodynamic parameters for last 24 hours:    Intake/Output from previous day: 04/01 0701 - 04/02 0700 In: 1110 [P.O.:1110] Out: 2000 [Urine:2000] Intake/Output this shift: No intake/output data recorded.  General appearance: alert, cooperative, and no distress Heart: regular rate and rhythm Lungs: clear to auscultation bilaterally Abdomen: benign Extremities: no edema Wound: incis healing well  Lab Results: No results for input(s): "WBC", "HGB", "HCT", "PLT" in the last 72 hours. BMET:  Recent Labs    03/12/24 0501 03/13/24 0344  NA 136 133*  K 3.9 4.0  CL 100 100  CO2 23 24  GLUCOSE 126* 106*  BUN 20 17  CREATININE 1.24 1.25*  CALCIUM 9.2 8.8*    PT/INR: No results for input(s): "LABPROT", "INR" in the last 72 hours. ABG    Component Value Date/Time   PHART 7.324 (L) 03/04/2024 1927   HCO3 21.1 03/04/2024 1927   TCO2 22 03/04/2024 1927   ACIDBASEDEF 4.0 (H) 03/04/2024 1927   O2SAT 95 03/04/2024 1927   CBG (last 3)  No results for input(s): "GLUCAP" in the last 72 hours.  Meds Scheduled Meds:  amiodarone  400 mg Oral BID   apixaban  5 mg Oral BID   atorvastatin  80 mg Oral Daily   bisacodyl  10 mg Oral Daily   Or   bisacodyl  10 mg Rectal Daily   Chlorhexidine Gluconate Cloth  6 each Topical  Daily   metoprolol succinate  25 mg Oral QHS   nicotine  14 mg Transdermal Daily   pantoprazole  40 mg Oral Daily   sodium chloride flush  3 mL Intravenous Q12H   SMOG  300 mL Rectal Once   tamsulosin  0.4 mg Oral Daily   Continuous Infusions:  amiodarone Stopped (03/10/24 1015)   PRN Meds:.ipratropium-albuterol, ondansetron (ZOFRAN) IV, oxyCODONE, sodium chloride flush  Xrays No results found.  Assessment/Plan: S/P Procedure(s) (LRB): CORONARY ARTERY BYPASS GRAFTING (CABG) TIMES THREE UTILIZING LEFT INTERNAL MAMMARY ARTERY AND ENDOSCOPIC VEIN HARVEST RIGHT GREATER SAPHENOUS VEIN (N/A) ECHOCARDIOGRAM, TRANSESOPHAGEAL (N/A) POD#9  1 afeb, VSS, normal sinus rhythm, on amio, metoprolol and apixiban for SVT/afib, will add baby asa, amio to be tapered 2 O2 sats ok on RA 3 voiding well, good UOP- on flomax, foley is out 4 creat stable at 1.25- should normalize as urinary retention issues and orthostasis  resolves completely- will add midodrine 5 insurance has authorized bed in CIR- waiting on availability of bed     LOS: 9 days    Rowe Clack PA-C Pager [phone] 03/13/2024   Agree with above Dispo planning  Aleeha Boline O Levin Dagostino

## 2024-03-13 NOTE — H&P (Signed)
 Physical Medicine and Rehabilitation Admission H&P    CC: Functional deficits due to debility   HPI:  Dave Williams is a 68 year old male with history of HTN, chronic left hip pain, TIA, substance abuse in the past, tobacco use, unstable angina who was found to have CAD and admitted on 03/04/24 for CABG X # by Dr. Cliffton Asters. Post op weaned without difficulty and pulmonary hygiene recommended for mucous plugging. He was found to have hematuria on foley removal and noted to have retention of 400 cc urine on 03/25 therefore Flomax added and foley replaced.   He developed SVT with HR in 140's post op  treated with amiodarone bolus and BB and transitioned to po amiodarone. He had orthostatic episode w/ syncope  with PT on 03/28 and BB decreased but with recurrence of SVT.  Dr. Royann Shivers consulted for input and recommended Eliquis with holding of Flomax as soon as able. Has required IV diuresis intermittently for fluid overload.  Foley removed on 03/30 and reported to be voiding without difficulty. He has had issues with constipation which have resolved with lactulose and enema. He is showing improvement with activity and therapy has been working with him. He is  limited by weakness, dizziness, orthostatic hypotension with drop in BP to 90's on standing, anxiety as well as sternal precautions. 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 Family in [place] and ex-girlfriend in SN[place].  Reports chronic thoracic back pain due to compression fractures controlled with yearly procedural treatments.  CIR recommended due to functional decline.    Review of Systems  Constitutional:  Negative for chills and fever.  HENT:  Negative for congestion, hearing loss and tinnitus.   Eyes:  Negative for blurred vision and double vision.  Respiratory:  Negative for cough and shortness of breath.   Cardiovascular:  Positive for chest pain. Negative for palpitations.  Gastrointestinal:  Negative for  abdominal pain, constipation and heartburn.  Genitourinary:  Negative for dysuria and urgency.  Musculoskeletal:  Positive for back pain.  Skin:  Negative for rash.  Neurological:  Positive for dizziness (with standing), sensory change (Along left side of sternal incision) and weakness. Negative for headaches.  Psychiatric/Behavioral:  Negative for depression. The patient does not have insomnia.     Past Medical History:  Diagnosis Date   Chronic left hip pain    Compression fracture of spine (HCC)    multiple compression Fx with disc bulge   Coronary artery disease    Hypertension    Pneumonia    As a teenager   Stroke [facility]) 2022   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    Past Surgical History:  Procedure Laterality Date   BACK SURGERY     Radiocartography T2-T12   CORONARY ARTERY BYPASS GRAFT N/A 03/04/2024   Procedure: CORONARY ARTERY BYPASS GRAFTING (CABG) TIMES THREE UTILIZING LEFT INTERNAL MAMMARY ARTERY AND ENDOSCOPIC VEIN HARVEST RIGHT GREATER SAPHENOUS VEIN;  Surgeon: Corliss Skains, MD;  Location: [facility] OR;  Service: Open Heart Surgery;  Laterality: N/A;   KNEE ARTHROSCOPY Bilateral    LEFT HEART CATH AND CORONARY ANGIOGRAPHY N/A 11/28/2023   Procedure: LEFT HEART CATH AND CORONARY ANGIOGRAPHY;  Surgeon: Swaziland, Peter M, MD;  Location: [facility] INVASIVE CV LAB;  Service: Cardiovascular;  Laterality: N/A;   PILONIDAL CYST EXCISION     In patients 20s   TEE WITHOUT  CARDIOVERSION N/A 03/04/2024   Procedure: ECHOCARDIOGRAM, TRANSESOPHAGEAL;  Surgeon: Corliss Skains, MD;  Location: [facility] OR;  Service: Open Heart Surgery;  Laterality: N/A;    Family History  Problem Relation Age of Onset   Cancer Mother    Hypertension Mother    Heart failure Mother    Cancer Father    Hypertension Father    Heart failure Father    Heart disease Father     Social History:  Lives alone. 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 He reports that he has been smoking cigarettes over 50 pack years and has started tapering to 1/2 PPD at admission.  He has never used smokeless tobacco. He reports that he does not currently use drugs after having used the following drugs: "Crack" cocaine and Marijuana. He reports that he does not drink alcohol--quit 13 years ago.   Allergies: No Known Allergies   Medications Prior to Admission  Medication Sig Dispense Refill   amiodarone (PACERONE) 200 MG tablet Take 2 tablets (400 mg total) by mouth 2 (two) times daily. Until 4/3.Marland Kitchen then beginning 4/4 decrease to 200 mg daily     apixaban (ELIQUIS) 5 MG TABS tablet Take 1 tablet (5 mg total) by mouth 2 (two) times daily.     aspirin EC 81 MG tablet Take 1 tablet (81 mg total) by mouth daily. Swallow whole.     atorvastatin (LIPITOR) 80 MG tablet Take 1 tablet (80 mg total) by mouth daily.     HYDROcodone-acetaminophen (NORCO) 7.5-325 MG tablet Take 1 tablet by mouth every 6 (six) hours as needed for moderate pain (pain score 4-6).     metoprolol succinate (TOPROL XL) 25 MG 24 hr tablet Take 1 tablet (25 mg total) by mouth daily. 30 tablet 11   midodrine (PROAMATINE) 5 MG tablet Take 1 tablet (5 mg total) by mouth 3 (three) times daily with meals.     nicotine (NICODERM CQ - DOSED IN MG/24 HOURS) 14 mg/24hr patch Place 1 patch (14 mg total) onto the skin daily.     nitroGLYCERIN (NITROSTAT) 0.4 MG SL tablet DISSOLVE 1 TABLET UNDER THE TONGUE EVERY 5 MINUTES AS NEEDED FOR CHEST PAIN. DO NOT EXCEED A TOTAL OF 3 DOSES IN 15 MINUTES. 25 tablet 2   tamsulosin (FLOMAX) 0.4 MG CAPS capsule Take 1 capsule (0.4 mg total) by mouth daily.     Vitamin D, Ergocalciferol, (DRISDOL) 1.25 MG (50000 UNIT) CAPS capsule Take 50,000 Units by mouth once a week.        Home: Home Living Family/patient expects to be discharged to:: Private residence Living Arrangements: Alone Available Help at Discharge: Friend(s),  Available PRN/intermittently Type of Home: House Home Access: Ramped entrance Home Layout: One level Bathroom Shower/Tub: Engineer, manufacturing systems: Standard Bathroom Accessibility: Yes Home Equipment: None   Functional History: Prior Function Prior Level of Function : Independent/Modified Independent, Driving, Working/employed Mobility Comments: Independent without an AD ADLs Comments: Independent with ADLs and IADLs, drives, and works as an Market researcher Status:  Mobility: Bed Mobility Overal bed mobility: Needs Assistance Bed Mobility: Rolling, Sit to Sidelying Rolling: Min assist Sidelying to sit: Min assist Sit to sidelying: Min assist General bed mobility comments: pt min A for sit to sidelying for LE management and proper sequencing cueing. Transfers Overall transfer level: Needs assistance Equipment used: None Transfers: Sit to/from Stand, Bed to chair/wheelchair/BSC Sit to Stand: Min assist, Contact guard assist,  From elevated surface Bed to/from chair/wheelchair/BSC transfer type:: Step pivot Step pivot transfers: Min assist General transfer comment: pt stands with hands on knees and grabs rollator handles once fully upright. Within 30 secs, pt reports increased dizziness and cold sweats. needing minA to boost from low chair and CGA from elevated bed. completed x7 in session Ambulation/Gait Ambulation/Gait assistance: Contact guard assist Gait Distance (Feet): 5 Feet Assistive device: Rollator (4 wheels) Gait Pattern/deviations: Step-to pattern, Decreased stride length General Gait Details: pt able to manage small lateral steps from chair to bed, reports dizziness after arriving at bed Gait velocity: decreased Gait velocity interpretation: <1.31 ft/sec, indicative of household ambulator   ADL: ADL Overall ADL's : Needs assistance/impaired Eating/Feeding: Independent, Sitting Grooming: Supervision/safety, Cueing for compensatory  techniques, Sitting (adhering to sternal precautions) Upper Body Bathing: Contact guard assist, Minimal assistance, Cueing for compensatory techniques, Sitting (adhering to sternal precautions) Lower Body Bathing: Supervison/ safety, Sitting/lateral leans, Maximal assistance, Sit to/from stand, +2 for physical assistance, +2 for safety/equipment, Cueing for compensatory techniques (adhering to sternal precautions) Upper Body Dressing : Minimal assistance, Cueing for compensatory techniques, Sitting (adhering to sternal precautions) Lower Body Dressing: Supervision/safety, Sitting/lateral leans, Maximal assistance, Sit to/from stand, Cueing for compensatory techniques, +2 for physical assistance, +2 for safety/equipment (adhering to sternal precautions) Toilet Transfer Details (indicate cue type and reason): deferred for pt/therapist safety due to pt with episode of orthostatic hypotension Toileting- Clothing Manipulation and Hygiene: Supervision/safety, Sitting/lateral lean, Cueing for compensatory techniques Functional mobility during ADLs:  (deferred for pt/therapist safety due to pt with episode of orthostatic hypotension) General ADL Comments: Pt with decreased activity tolerance and episode of orthostatic hypotension limiting functional level. Pt also with decreased knowledge of compensatory strategies and adaptive techniques to complete ADLs while adhering to sternal precautions.   Cognition: Cognition Orientation Level: Oriented X4 Cognition Arousal: Alert Behavior During Therapy: WFL for tasks assessed/performed       Blood pressure 122/70, pulse 80, temperature 98.5 F (36.9 C), temperature source Oral, resp. rate 17, SpO2 94%.   General: No apparent distress HEENT: Head is normocephalic, atraumatic, sclera anicteric, oral mucosa pink and moist, decreased dentition particularly in upper teeth Neck: Supple without JVD or lymphadenopathy Heart: Reg rate and rhythm. No murmurs rubs  or gallops Chest: CTA bilaterally without wheezes, rales, or rhonchi; no distress Abdomen: Soft, non-tender, non-distended, bowel sounds positive. Extremities: No clubbing, cyanosis, or edema. Pulses are 2+ Psych: Pt's affect is appropriate. Pt is cooperative Skin: Sternal incision CDI, right leg graft site appears to be healing well  Neuro:    Mental Status: AAOx4, commands, no apparent cognitive deficits noted, cranial nerves II through XII intact   MOTOR: RUE: At least 4 out of 5 limited by sternal precautions  LUE: At least 4 out of 5 limited by sternal precautions RLE: HF 5/5, KE 5/5, ADF 5/5, APF 5/5 LLE: HF 5/5, KE 5/5, ADF 5/5, APF 5/5 REFLEXES: No ankle clonus  SENSORY: Normal to touch all 4 extremities, altered on the left side of his surgical incision  Coordination: No ataxia or dysmetria noted     Results for orders placed or performed during the hospital encounter of 03/04/24 (from the past 48 hours)  Basic metabolic panel     Status: Abnormal   Collection Time: 03/12/24  5:01 AM  Result Value Ref Range   Sodium 136 135 - 145 mmol/L   Potassium 3.9 3.5 - 5.1 mmol/L   Chloride 100 98 - 111 mmol/L   CO2 23 22 -  32 mmol/L   Glucose, Bld 126 (H) 70 - 99 mg/dL    Comment: Glucose reference range applies only to samples taken after fasting for at least 8 hours.   BUN 20 8 - 23 mg/dL   Creatinine, Ser 8.11 0.61 - 1.24 mg/dL   Calcium 9.2 8.9 - 91.4 mg/dL   GFR, Estimated >78 >29 mL/min    Comment: (NOTE) Calculated using the CKD-EPI Creatinine Equation (2021)    Anion gap 13 5 - 15    Comment: Performed at [facility], [address] [place], [place] [address]  Basic metabolic panel     Status: Abnormal   Collection Time: 03/13/24  3:44 AM  Result Value Ref Range   Sodium 133 (L) 135 - 145 mmol/L   Potassium 4.0 3.5 - 5.1 mmol/L   Chloride 100 98 - 111 mmol/L   CO2 24 22 - 32 mmol/L   Glucose, Bld 106 (H) 70 - 99 mg/dL    Comment: Glucose reference  range applies only to samples taken after fasting for at least 8 hours.   BUN 17 8 - 23 mg/dL   Creatinine, Ser 0.86 (H) 0.61 - 1.24 mg/dL   Calcium 8.8 (L) 8.9 - 10.3 mg/dL   GFR, Estimated >57 >84 mL/min    Comment: (NOTE) Calculated using the CKD-EPI Creatinine Equation (2021)    Anion gap 9 5 - 15    Comment: Performed at [facility], [address] [place], [place] [address]   No results found.    There were no vitals taken for this visit.  Medical Problem List and Plan: 1. Functional deficits secondary to debility after three-vessel CABG  -patient may shower, cover incisions   -ELOS/Goals: 7 days, Mod I PT/OT  -Admit to CIR 2.  Antithrombotics: -DVT/anticoagulation:  Pharmaceutical: Eliquis  -antiplatelet therapy: ASA 3. Pain Management: Oxycodone prn for severe pain.   -4/2 history of substance abuse years ago 4. Mood/Behavior/Sleep: LCSW to follow for evaluation and support.   -antipsychotic agents: N/A 5. Neuropsych/cognition: This patient is capable of making decisions on his own behalf. 6. Skin/Wound Care: Monitor wound for healing.  7. Fluids/Electrolytes/Nutrition:  Monitor I/O. Check CMET in am 8. A fib: Monitor HR TID--continue Amiodarone 400 mg bid  -Heart rate control 9. Orthostatic hypotension: Encourage fluid intake. Midodrine added on 04/02.   10. Urinary retention: On Flomax and reports voiding without difficulty.  voiding --Foley discontinued on 03/30. Educated on need for PVRs 1-2 days to monitor function.    --has had rise in SCr from 0.9-->1.26, recheck labs tomorrow 11. Acute blood loss anemia: Recheck CBC in am. .  12. Constipation: On dulcolax tabs daily--had BM on 03/31.   -4/2 consider additional medication tomorrow if no BMs 13.  Tobacco abuse: "Smoked his last cigarette on way to surgery"  --Continue nicotine patch.  -Tobacco cessation counseling 14. Impaired fasting glucose: Hgb A1C- 5.8. Continue CM diet.    Jacquelynn Cree,  PA-C 03/13/2024  I have personally performed a face to face diagnostic evaluation of this patient and formulated the key components of the plan.  Additionally, I have personally reviewed laboratory data, imaging studies, as well as relevant notes and concur with the physician assistant's documentation above.  The patient's status has not changed from the original H&P.  Any changes in documentation from the acute care chart have been noted above.  Fanny Dance, MD

## 2024-03-13 NOTE — Progress Notes (Signed)
 Inpatient Rehabilitation Admissions Coordinator   I have CIR bed to admit him to today. Acute team and TOC made aware. I will make the arrangements.  Ottie Glazier, RN, MSN Rehab Admissions Coordinator [phone] 03/13/2024 9:20 AM

## 2024-03-13 NOTE — Progress Notes (Signed)
 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 Cliffton Asters. Pt with hx of severe COPD, post-operative with a lot of productive cough, concern for pneumonia, oxygen requirement, a-flutter. Orthostatic hypotension affecting mobility tolerance as well with syncope/LOC recorded.  +urine retention--flomax. Hypothyroid as well. Now on room air. Pt has been up with therapy and transferring at [facility] assist. He is walking with rollator 4-5' at [facility] assist. Pt lives alone in a one level home with ramped entrance. He was working and independent before.      Review of Systems  Constitutional: Negative.   HENT: Negative.    Eyes: Negative.   Respiratory:  Positive for cough and shortness of breath.   Cardiovascular:  Positive for chest pain and leg swelling.  Gastrointestinal:  Positive for nausea.  Genitourinary: Negative.   Musculoskeletal:  Positive for myalgias.  Skin: Negative.   Neurological:  Positive for dizziness.  Psychiatric/Behavioral: Negative.          Past Medical History:  Diagnosis Date   Chronic left hip pain     Coronary artery disease     Hypertension     Pneumonia      As a teenager   Stroke [facility]) 2022    Old Infarct noted on CT Scan   Substance abuse (HCC)      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             Past Surgical History:   Procedure Laterality Date   BACK SURGERY        Radiocartography T2-T12   CORONARY ARTERY BYPASS GRAFT N/A 03/04/2024    Procedure: CORONARY ARTERY BYPASS GRAFTING (CABG) TIMES THREE UTILIZING LEFT INTERNAL MAMMARY ARTERY AND ENDOSCOPIC VEIN HARVEST RIGHT GREATER SAPHENOUS VEIN;  Surgeon: Corliss Skains, MD;  Location: [facility] OR;  Service: Open Heart Surgery;  Laterality: N/A;   KNEE ARTHROSCOPY Bilateral     LEFT HEART CATH AND CORONARY ANGIOGRAPHY N/A 11/28/2023    Procedure: LEFT HEART CATH AND CORONARY ANGIOGRAPHY;  Surgeon: [place], Peter M, MD;  Location: [facility] INVASIVE CV LAB;  Service: Cardiovascular;  Laterality: N/A;   PILONIDAL CYST EXCISION        In patients 20s   TEE WITHOUT CARDIOVERSION N/A 03/04/2024    Procedure: ECHOCARDIOGRAM, TRANSESOPHAGEAL;  Surgeon: Corliss Skains, MD;  Location: [facility] OR;  Service: Open Heart Surgery;  Laterality: N/A;             Family History  Problem Relation Age of Onset   Cancer Mother     Hypertension Mother     Heart failure Mother     Cancer Father     Hypertension Father     Heart failure Father          Social History:  reports that he has been  smoking cigarettes. He has never used smokeless tobacco. He reports that he does not currently use drugs after having used the following drugs: "Crack" cocaine and Marijuana. He reports that he does not drink alcohol. Allergies:  Allergies  No Known Allergies         Medications Prior to Admission  Medication Sig Dispense Refill   amLODipine (NORVASC) 5 MG tablet Take 1 tablet (5 mg total) by mouth daily. 30 tablet 0   aspirin 81 MG chewable tablet Chew 81 mg by mouth daily.       atorvastatin (LIPITOR) 20 MG tablet Take 20 mg by mouth daily.       HYDROcodone-acetaminophen (NORCO) 7.5-325 MG tablet Take 1 tablet by mouth every 6 (six) hours as needed for moderate pain (pain score 4-6).       isosorbide mononitrate (IMDUR) 30 MG 24 hr tablet Take 1 tablet (30 mg total) by mouth daily.  30 tablet 11   metoprolol succinate (TOPROL XL) 25 MG 24 hr tablet Take 1 tablet (25 mg total) by mouth daily. 30 tablet 11   nitroGLYCERIN (NITROSTAT) 0.4 MG SL tablet DISSOLVE 1 TABLET UNDER THE TONGUE EVERY 5 MINUTES AS NEEDED FOR CHEST PAIN. DO NOT EXCEED A TOTAL OF 3 DOSES IN 15 MINUTES. 25 tablet 2   Vitamin D, Ergocalciferol, (DRISDOL) 1.25 MG (50000 UNIT) CAPS capsule Take 50,000 Units by mouth once a week.       aspirin EC 81 MG tablet Take 1 tablet (81 mg total) by mouth daily. Swallow whole. (Patient not taking: Reported on 02/23/2024)              Home: Home Living Family/patient expects to be discharged to:: Private residence Living Arrangements: Alone Available Help at Discharge: Friend(s), Available PRN/intermittently Type of Home: House Home Access: Ramped entrance Home Layout: One level Bathroom Shower/Tub: Engineer, manufacturing systems: Standard Home Equipment: None  Functional History: Prior Function Prior Level of Function : Independent/Modified Independent, Driving, Working/employed Mobility Comments: Independent without an AD ADLs Comments: Independent with ADLs and IADLs, drives, and works as an Environmental education officer Status:  Mobility: Bed Mobility Overal bed mobility: Needs Assistance Bed Mobility: Rolling, Sidelying to Sit, Sit to Sidelying Rolling: Min assist Sidelying to sit: Min assist Sit to sidelying: Min assist General bed mobility comments: pt min A for sit to sidelying for LE management and cueing for technique while adhering to sternal precautions Transfers Overall transfer level: Needs assistance Equipment used: None Transfers: Sit to/from Stand Sit to Stand: Min assist, +2 safety/equipment, +2 physical assistance General transfer comment: pt stood adhering to sternal precautions with hands on knees and grasped rollator handles once fully upright. However, within 30 secs of standing, pt reporting increased dizziness, cold  sweats, and reporting need to sit back down. Ambulation/Gait Ambulation/Gait assistance: Contact guard assist Gait Distance (Feet): 5 Feet Assistive device: Rollator (4 wheels) Gait Pattern/deviations: Step-through pattern, Decreased stride length General Gait Details: deferred s/t pt presentation Gait velocity: decr Gait velocity interpretation: 1.31 - 2.62 ft/sec, indicative of limited community ambulator   ADL: ADL Overall ADL's : Needs assistance/impaired Eating/Feeding: Independent, Sitting Grooming: Supervision/safety, Cueing for compensatory techniques, Sitting (adhering to sternal precautions) Upper Body Bathing: Contact guard assist, Minimal assistance, Cueing for compensatory techniques, Sitting (adhering to sternal precautions) Lower Body Bathing: Supervison/ safety, Sitting/lateral leans, Maximal assistance, Sit to/from stand, +2 for physical assistance, +2 for safety/equipment, Cueing for compensatory techniques (adhering to sternal precautions) Upper Body Dressing : Minimal assistance, Cueing  for compensatory techniques, Sitting (adhering to sternal precautions) Lower Body Dressing: Supervision/safety, Sitting/lateral leans, Maximal assistance, Sit to/from stand, Cueing for compensatory techniques, +2 for physical assistance, +2 for safety/equipment (adhering to sternal precautions) Toilet Transfer Details (indicate cue type and reason): deferred for pt/therapist safety due to pt with episode of orthostatic hypotension Toileting- Clothing Manipulation and Hygiene: Supervision/safety, Sitting/lateral lean, Cueing for compensatory techniques Functional mobility during ADLs:  (deferred for pt/therapist safety due to pt with episode of orthostatic hypotension) General ADL Comments: Pt with decreased activity tolerance and episode of orthostatic hypotension limiting functional level. Pt also with decreased knowledge of compensatory strategies and adaptive techniques to complete ADLs  while adhering to sternal precautions.   Cognition: Cognition Orientation Level: Oriented X4 Cognition Arousal: Alert Behavior During Therapy: WFL for tasks assessed/performed   Blood pressure 134/81, pulse 82, temperature 98.1 F (36.7 C), temperature source Oral, resp. rate 16, height 6\' 3"  (1.905 m), weight 90.9 kg, SpO2 96%. Physical Exam Constitutional:      Appearance: He is not ill-appearing.  HENT:     Head: Normocephalic.     Right Ear: External ear normal.     Left Ear: External ear normal.     Nose: Nose normal.     Mouth/Throat:     Mouth: Mucous membranes are moist.  Eyes:     Extraocular Movements: Extraocular movements intact.     Pupils: Pupils are equal, round, and reactive to light.  Cardiovascular:     Rate and Rhythm: Normal rate.  Pulmonary:     Effort: Pulmonary effort is normal.  Abdominal:     General: Bowel sounds are normal.  Musculoskeletal:     Cervical back: Normal range of motion.     Right lower leg: Edema present.  Skin:    Comments: Chest incision CDI. Donor sites intact/dry  Neurological:     Mental Status: He is alert.     Comments: Alert and oriented x 3. Normal insight and awareness. Intact Memory. Normal language and speech. Cranial nerve exam unremarkable. MMT: UE 4/5 prox to distal. LE 4-/5 HF, KE and 4+/5 ADF/PF. Sensory exam normal for light touch and pain in all 4 limbs. No limb ataxia or cerebellar signs. No abnormal tone appreciated.  Marland Kitchen    Psychiatric:        Mood and Affect: Mood normal.        Behavior: Behavior normal.        Lab Results Last 24 Hours       Results for orders placed or performed during the hospital encounter of 03/04/24 (from the past 24 hours)  Basic metabolic panel     Status: Abnormal    Collection Time: 03/11/24  1:45 AM  Result Value Ref Range    Sodium 134 (L) 135 - 145 mmol/L    Potassium 4.0 3.5 - 5.1 mmol/L    Chloride 101 98 - 111 mmol/L    CO2 22 22 - 32 mmol/L    Glucose, Bld 115 (H) 70  - 99 mg/dL    BUN 16 8 - 23 mg/dL    Creatinine, Ser 4.09 0.61 - 1.24 mg/dL    Calcium 8.8 (L) 8.9 - 10.3 mg/dL    GFR, Estimated >81 >19 mL/min    Anion gap 11 5 - 15      Imaging Results (Last 48 hours)  No results found.     Assessment/Plan: Diagnosis: 68 yo male with debility after 3 vessel CABG.  Does the need  for close, 24 hr/day medical supervision in concert with the patient's rehab needs make it unreasonable for this patient to be served in a less intensive setting? Yes Co-Morbidities requiring supervision/potential complications:  -COPD -symptomatic orthostatic hypotension -post-op a-flutter -hypertension Due to bladder management, bowel management, safety, skin/wound care, disease management, medication administration, pain management, and patient education, does the patient require 24 hr/day rehab nursing? Yes Does the patient require coordinated care of a physician, rehab nurse, therapy disciplines of PT, OT to address physical and functional deficits in the context of the above medical diagnosis(es)? Yes Addressing deficits in the following areas: balance, endurance, locomotion, strength, transferring, bowel/bladder control, bathing, dressing, feeding, grooming, toileting, and psychosocial support Can the patient actively participate in an intensive therapy program of at least 3 hrs of therapy per day at least 5 days per week? Yes The potential for patient to make measurable gains while on inpatient rehab is excellent Anticipated functional outcomes upon discharge from inpatient rehab are modified independent  with PT, modified independent with OT, n/a with SLP. Estimated rehab length of stay to reach the above functional goals is: 7 days Anticipated discharge destination: Home Overall Rehab/Functional Prognosis: excellent   POST ACUTE RECOMMENDATIONS: This patient's condition is appropriate for continued rehabilitative care in the following setting: CIR Patient has  agreed to participate in recommended program. Yes Note that insurance prior authorization may be required for reimbursement for recommended care.   Comment: Pt lives alone. He has had  documented post-operative atrial flutter and episodes of orthostasis, some manifesting in syncope/loss of consciousness. Would benefit from close monitoring on inpatient rehab as we push him physically with mobility and self-care to reach modified independent goals      I have personally performed a face to face diagnostic evaluation of this patient. Additionally, I have examined the patient's medical record including any pertinent labs and radiographic images.     Thanks,   Dave Oyster, MD 03/11/2024          Routing History

## 2024-03-13 NOTE — Progress Notes (Signed)
 Dave Dance, MD  Physician Physical Medicine and Rehabilitation   PMR Pre-admission    Signed   Date of Service: 03/11/2024  3:08 PM  Related encounter: Admission (Discharged) from 03/04/2024 in [facility] 4E CV SURGICAL PROGRESSIVE CARE   Signed     Expand All Collapse All  Show:Clear all [x] Written[x] Templated[x] Copied  Added by: [x] Standley Brooking, RN[x] Beryle Beams  [] Hover for details PMR Admission Coordinator Pre-Admission Assessment   Patient: Dave Williams is an 67 y.o., male MRN: [id number] DOB: Jul 11, 1956 Height: 6\' 3"  (190.5 cm) Weight: 101.6 kg                                                                                                                                                  Insurance Information HMO: yes    PPO:      PCP:      IPA:      80/20:      OTHER:  PRIMARY: [employer] Medicare HMO/PPO    Policy#: [id number]      Subscriber: patient CM Name: Dave Williams      Phone#: [phone]     Fax#: [phone] Pre-Cert#: [id number] approved 4/2 until 4/9 with updates due 4/7 to Tiffany phone; unavailable fax [phone]      Employer:  Benefits:  Phone #: [phone]     Name: 3/31 Eff. Date: 12/13/2023-still active     Deduct: does not have      Out of Pocket Max: $5,500( $100 met)       CIR: $374/day co pay with max copay of $2,992/admission ( 8 days) SNF: $10/day co pay for days 1-20 Outpatient: $30/visit co pay      Home Health: $20/copay /visit       DME: 80% coverage     20% co insurance   The "Data Collection Information Summary" for patients in Inpatient Rehabilitation Facilities with attached "Privacy Act Statement-Health Care Records" was provided and verbally reviewed with: Patient   Emergency Contact Information Contact Information       Name Relation Home Work [place] Sister [phone]   [phone]         Other Contacts   None on File      Current Medical History  Patient Admitting Diagnosis:  CABG x 3   History of Present Illness: Dave Williams 68 y.o. male with past medical history of COPD. Presents for surgical evaluation of three-vessel coronary artery disease. He works as an Museum/gallery exhibitions officer, and has had 2 emergency room visits for worsening chest pain and shortness of breath. He subsequently underwent a left heart cath 11/28/23 which identified the disease. Currently experiencing some anginal symptoms with exertion. Admitted 03/04/24.   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 Develpoed urinary retention, started flomax. Had some hematuria on foley removal. SVT, rate in 140s received amiodarone bolus and transitioned to  po. Near syncope episode with PT on 3/28. Dr Royann Shivers consulted for input and recommended ELiquis with holding of Flomax as soon as able. Required IV diuresis  intermittently for fluid overload. Patient with deconditioning, mobility limited by orthostasis, near syncope.    Patient's medical record from [facility] has been reviewed by the rehabilitation admission coordinator and physician.   Past Medical History      Past Medical History:  Diagnosis Date   Chronic left hip pain     Coronary artery disease     Hypertension     Pneumonia      As a teenager   Stroke [facility]) 2022    Old Infarct noted on CT Scan   Substance abuse (HCC)      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        Has the patient had major surgery during 100 days prior to admission? Yes   Family History  family history includes Cancer in his father and mother; Heart disease in his father; Heart failure in his father and mother; Hypertension in his father and mother.   Current Medications   Current Medications    Current Facility-Administered Medications:    amiodarone (NEXTERONE PREMIX) 360-4.14 MG/200ML-% (1.8 mg/mL) IV infusion, 30 mg/hr, Intravenous, Continuous, Barrett, Erin R, PA-C, Stopped at 03/10/24 1015   amiodarone (PACERONE) tablet 400 mg, 400 mg, Oral, BID, Barrett, Erin R, PA-C, 400 mg at  03/13/24 0825   apixaban (ELIQUIS) tablet 5 mg, 5 mg, Oral, BID, Roslyn Smiling, RPH, 5 mg at 03/13/24 0825   aspirin EC tablet 81 mg, 81 mg, Oral, Daily, Gold, Wayne E, PA-C, 81 mg at 03/13/24 9629   atorvastatin (LIPITOR) tablet 80 mg, 80 mg, Oral, Daily, Gold, Wayne E, PA-C, 80 mg at 03/13/24 0947   bisacodyl (DULCOLAX) EC tablet 10 mg, 10 mg, Oral, Daily, 10 mg at 03/11/24 0846 **OR** bisacodyl (DULCOLAX) suppository 10 mg, 10 mg, Rectal, Daily, Gold, Wayne E, PA-C   ipratropium-albuterol (DUONEB) 0.5-2.5 (3) MG/3ML nebulizer solution 3 mL, 3 mL, Nebulization, Q4H PRN, Gold, Wayne E, PA-C   metoprolol succinate (TOPROL-XL) 24 hr tablet 25 mg, 25 mg, Oral, QHS, Barrett, Erin R, PA-C, 25 mg at 03/12/24 2145   midodrine (PROAMATINE) tablet 5 mg, 5 mg, Oral, TID WC, Gold, Wayne E, PA-C   nicotine (NICODERM CQ - dosed in mg/24 hours) patch 14 mg, 14 mg, Transdermal, Daily, Gold, Wayne E, PA-C, 14 mg at 03/13/24 0827   ondansetron (ZOFRAN) injection 4 mg, 4 mg, Intravenous, Q6H PRN, Gold, Wayne E, PA-C, 4 mg at 03/04/24 1512   oxyCODONE (Oxy IR/ROXICODONE) immediate release tablet 5-10 mg, 5-10 mg, Oral, Q3H PRN, Gold, Wayne E, PA-C, 5 mg at 03/13/24 0456   pantoprazole (PROTONIX) EC tablet 40 mg, 40 mg, Oral, Daily, Gold, Wayne E, PA-C, 40 mg at 03/13/24 0825   sodium chloride flush (NS) 0.9 % injection 3 mL, 3 mL, Intravenous, Q12H, Gold, Wayne E, PA-C, 3 mL at 03/13/24 5284   sodium chloride flush (NS) 0.9 % injection 3 mL, 3 mL, Intravenous, PRN, Gold, Wayne E, PA-C   sorbitol, magnesium hydroxide, mineral oil, glycerin (SMOG) enema, 300 mL, Rectal, Once, Barrett, Erin R, PA-C   tamsulosin (FLOMAX) capsule 0.4 mg, 0.4 mg, Oral, Daily, Gold, Wayne E, PA-C, 0.4 mg at 03/13/24 1324     Patients Current Diet:  Diet Order                  Diet Heart  Room service appropriate? Yes with Assist; Fluid consistency: Thin  Diet effective now                       Precautions /  Restrictions Precautions Precautions: Fall, Sternal Precaution Booklet Issued: No Precaution/Restrictions Comments: watch orthostatics Restrictions Weight Bearing Restrictions Per Provider Order: Yes (sternal precautions) RUE Weight Bearing Per Provider Order: (P) Non weight bearing LUE Weight Bearing Per Provider Order: (P) Non weight bearing Other Position/Activity Restrictions: sternal precautions    Has the patient had 2 or more falls or a fall with injury in the past year?No   Prior Activity Level Community (5-7x/wk): independent, working and driving   Prior Functional Level Prior Function Prior Level of Function : Independent/Modified Independent, Driving, Working/employed Mobility Comments: Independent without an AD ADLs Comments: Independent with ADLs and IADLs, drives, and works as an Social worker   Self Care: Did the patient need help bathing, dressing, using the toilet or eating?  Independent   Indoor Mobility: Did the patient need assistance with walking from room to room (with or without device)? Independent   Stairs: Did the patient need assistance with internal or external stairs (with or without device)? Independent   Functional Cognition: Did the patient need help planning regular tasks such as shopping or remembering to take medications? Independent   Patient Information Are you of Hispanic, Latino/a,or Spanish origin?: A. No, not of Hispanic, Latino/a, or Spanish origin What is your race?: A. White Do you need or want an interpreter to communicate with a doctor or health care staff?: 0. No   Patient's Response To:  Health Literacy and Transportation Is the patient able to respond to health literacy and transportation needs?: Yes Health Literacy - How often do you need to have someone help you when you read instructions, pamphlets, or other written material from your doctor or pharmacy?: Never In the past 12 months, has lack of transportation  kept you from medical appointments or from getting medications?: No In the past 12 months, has lack of transportation kept you from meetings, work, or from getting things needed for daily living?: No   Home Assistive Devices / Equipment Home Equipment: None   Prior Device Use: Indicate devices/aids used by the patient prior to current illness, exacerbation or injury? None of the above   Current Functional Level Cognition   Orientation Level: Oriented X4    Extremity Assessment (includes Sensation/Coordination)   Upper Extremity Assessment: Right hand dominant, Overall WFL for tasks assessed (tested within sternal precautions)  Lower Extremity Assessment: Defer to PT evaluation     ADLs   Overall ADL's : Needs assistance/impaired Eating/Feeding: Independent, Sitting Grooming: Supervision/safety, Cueing for compensatory techniques, Sitting (adhering to sternal precautions) Upper Body Bathing: Contact guard assist, Minimal assistance, Cueing for compensatory techniques, Sitting (adhering to sternal precautions) Lower Body Bathing: Supervison/ safety, Sitting/lateral leans, Maximal assistance, Sit to/from stand, +2 for physical assistance, +2 for safety/equipment, Cueing for compensatory techniques (adhering to sternal precautions) Upper Body Dressing : Minimal assistance, Cueing for compensatory techniques, Sitting (adhering to sternal precautions) Lower Body Dressing: Supervision/safety, Sitting/lateral leans, Maximal assistance, Sit to/from stand, Cueing for compensatory techniques, +2 for physical assistance, +2 for safety/equipment (adhering to sternal precautions) Toilet Transfer Details (indicate cue type and reason): deferred for pt/therapist safety due to pt with episode of orthostatic hypotension Toileting- Clothing Manipulation and Hygiene: Supervision/safety, Sitting/lateral lean, Cueing for compensatory techniques Functional mobility during ADLs:  (deferred for pt/therapist safety  due  to pt with episode of orthostatic hypotension) General ADL Comments: Pt with decreased activity tolerance and episode of orthostatic hypotension limiting functional level. Pt also with decreased knowledge of compensatory strategies and adaptive techniques to complete ADLs while adhering to sternal precautions.     Mobility   Overal bed mobility: Needs Assistance Bed Mobility: Rolling, Sit to Sidelying Rolling: Min assist Sidelying to sit: Min assist Sit to sidelying: Min assist General bed mobility comments: pt min A for sit to sidelying for LE management and proper sequencing cueing.     Transfers   Overall transfer level: Needs assistance Equipment used: None Transfers: Sit to/from Stand, Bed to chair/wheelchair/BSC Sit to Stand: Min assist, Contact guard assist, From elevated surface Bed to/from chair/wheelchair/BSC transfer type:: Step pivot Step pivot transfers: Min assist General transfer comment: pt stands with hands on knees and grabs rollator handles once fully upright. Within 30 secs, pt reports increased dizziness and cold sweats. needing minA to boost from low chair and CGA from elevated bed. completed x7 in session     Ambulation / Gait / Stairs / Wheelchair Mobility   Ambulation/Gait Ambulation/Gait assistance: Contact guard assist Gait Distance (Feet): 5 Feet Assistive device: Rollator (4 wheels) Gait Pattern/deviations: Step-to pattern, Decreased stride length General Gait Details: pt able to manage small lateral steps from chair to bed, reports dizziness after arriving at bed Gait velocity: decreased Gait velocity interpretation: <1.31 ft/sec, indicative of household ambulator     Posture / Balance Dynamic Sitting Balance Sitting balance - Comments: pt sits with UEs across his chest, holding a heart pillow. No LOB or leaning Balance Overall balance assessment: Needs assistance Sitting-balance support: No upper extremity supported, Feet supported Sitting  balance-Leahy Scale: Good Sitting balance - Comments: pt sits with UEs across his chest, holding a heart pillow. No LOB or leaning Standing balance support: During functional activity, No upper extremity supported, Bilateral upper extremity supported Standing balance-Leahy Scale: Fair Standing balance comment: pt initially stands without UE support before grabbing handles of rollator. As his dizziness increases he begins to sway slightly in the frontal plane     Special needs/care consideration      Previous Home Environment  Living Arrangements: Alone Available Help at Discharge: Friend(s), Available PRN/intermittently Type of Home: House Home Layout: One level Home Access: Ramped entrance Bathroom Shower/Tub: Engineer, manufacturing systems: Standard Bathroom Accessibility: Yes How Accessible: Accessible via walker Home Care Services: No   Discharge Living Setting Plans for Discharge Living Setting: Patient's home, Alone Type of Home at Discharge: House Discharge Home Layout: One level Discharge Home Access: Ramped entrance Discharge Bathroom Shower/Tub: Tub/shower unit Discharge Bathroom Toilet: Standard Discharge Bathroom Accessibility: Yes How Accessible: Accessible via walker Does the patient have any problems obtaining your medications?: No   Social/Family/Support Systems Patient Roles: Other (Comment) (employee) Contact Information: sister, Robynn Pane Anticipated Caregiver: friends intermittently Anticipated Industrial/product designer Information: see sister's contact information Ability/Limitations of Caregiver: lives out of state Caregiver Availability: Intermittent Discharge Plan Discussed with Primary Caregiver: Yes Is Caregiver In Agreement with Plan?: Yes Does Caregiver/Family have Issues with Lodging/Transportation while Pt is in Rehab?: No   Goals Patient/Family Goal for Rehab: Mod I PT/OT Expected length of stay: 7 days Pt/Family Agrees to Admission and willing to  participate: Yes Program Orientation Provided & Reviewed with Pt/Caregiver Including Roles  & Responsibilities: Yes  Barriers to Discharge: Insurance for SNF coverage   Decrease burden of Care through IP rehab admission: n/a   Possible need for SNF placement upon  discharge:not anticipated     Patient Condition: This patient's medical and functional status has not changed since the consult dated 03/11/24 in which the Rehabilitation Physician determined and documented that the patient was potentially appropriate for intensive rehabilitative care in an inpatient rehabilitation facility. Will admit to inpatient rehab today.    Preadmission Screen Completed By:  Clois Dupes, RN MSN, 03/13/2024 9:59 AM ______________________________________________________________________   Discussed status with Dr. Natale Lay on 03/13/24 at 1000 and received approval for admission today.   Admission Coordinator:  Clois Dupes, RN MSN time 1000 Date 03/13/24            Revision History

## 2024-03-14 DIAGNOSIS — R5381 Other malaise: Secondary | ICD-10-CM | POA: Diagnosis not present

## 2024-03-14 DIAGNOSIS — R7309 Other abnormal glucose: Secondary | ICD-10-CM

## 2024-03-14 DIAGNOSIS — K5900 Constipation, unspecified: Secondary | ICD-10-CM | POA: Diagnosis not present

## 2024-03-14 DIAGNOSIS — D72829 Elevated white blood cell count, unspecified: Secondary | ICD-10-CM

## 2024-03-14 LAB — CBC WITH DIFFERENTIAL/PLATELET
Abs Immature Granulocytes: 0.25 10*3/uL — ABNORMAL HIGH (ref 0.00–0.07)
Basophils Absolute: 0.1 10*3/uL (ref 0.0–0.1)
Basophils Relative: 1 %
Eosinophils Absolute: 0.4 10*3/uL (ref 0.0–0.5)
Eosinophils Relative: 3 %
HCT: 34.8 % — ABNORMAL LOW (ref 39.0–52.0)
Hemoglobin: 11.8 g/dL — ABNORMAL LOW (ref 13.0–17.0)
Immature Granulocytes: 2 %
Lymphocytes Relative: 20 %
Lymphs Abs: 3.1 10*3/uL (ref 0.7–4.0)
MCH: 30.9 pg (ref 26.0–34.0)
MCHC: 33.9 g/dL (ref 30.0–36.0)
MCV: 91.1 fL (ref 80.0–100.0)
Monocytes Absolute: 1.9 10*3/uL — ABNORMAL HIGH (ref 0.1–1.0)
Monocytes Relative: 12 %
Neutro Abs: 9.7 10*3/uL — ABNORMAL HIGH (ref 1.7–7.7)
Neutrophils Relative %: 62 %
Platelets: 521 10*3/uL — ABNORMAL HIGH (ref 150–400)
RBC: 3.82 MIL/uL — ABNORMAL LOW (ref 4.22–5.81)
RDW: 12.8 % (ref 11.5–15.5)
WBC: 15.3 10*3/uL — ABNORMAL HIGH (ref 4.0–10.5)
nRBC: 0 % (ref 0.0–0.2)

## 2024-03-14 LAB — COMPREHENSIVE METABOLIC PANEL WITH GFR
ALT: 33 U/L (ref 0–44)
AST: 28 U/L (ref 15–41)
Albumin: 3 g/dL — ABNORMAL LOW (ref 3.5–5.0)
Alkaline Phosphatase: 49 U/L (ref 38–126)
Anion gap: 9 (ref 5–15)
BUN: 17 mg/dL (ref 8–23)
CO2: 22 mmol/L (ref 22–32)
Calcium: 8.9 mg/dL (ref 8.9–10.3)
Chloride: 102 mmol/L (ref 98–111)
Creatinine, Ser: 1.14 mg/dL (ref 0.61–1.24)
GFR, Estimated: >60 mL/min (ref >60)
Glucose, Bld: 112 mg/dL — ABNORMAL HIGH (ref 70–99)
Potassium: 4.3 mmol/L (ref 3.5–5.1)
Sodium: 133 mmol/L — ABNORMAL LOW (ref 135–145)
Total Bilirubin: 1.1 mg/dL (ref 0.0–1.2)
Total Protein: 6.9 g/dL (ref 6.5–8.1)

## 2024-03-14 MED ORDER — POLYETHYLENE GLYCOL 3350 17 G PO PACK: 17.0000 g | PACK | Freq: Every day | ORAL | Status: DC | PRN

## 2024-03-14 MED ORDER — SORBITOL 70 % SOLN: 30.0000 mL | Freq: Every day | Status: DC | PRN

## 2024-03-14 NOTE — Plan of Care (Signed)
  Problem: RH Balance Goal: LTG Patient will maintain dynamic standing with ADLs (OT) Description: LTG:  Patient will maintain dynamic standing balance with assist during activities of daily living (OT)  Flowsheets (Taken 03/14/2024 1241) LTG: Pt will maintain dynamic standing balance during ADLs with: Independent with assistive device   Problem: Sit to Stand Goal: LTG:  Patient will perform sit to stand in prep for activites of daily living with assistance level (OT) Description: LTG:  Patient will perform sit to stand in prep for activites of daily living with assistance level (OT) Flowsheets (Taken 03/14/2024 1241) LTG: PT will perform sit to stand in prep for activites of daily living with assistance level: Independent with assistive device   Problem: RH Bathing Goal: LTG Patient will bathe all body parts with assist levels (OT) Description: LTG: Patient will bathe all body parts with assist levels (OT) Flowsheets (Taken 03/14/2024 1241) LTG: Pt will perform bathing with assistance level/cueing: Independent with assistive device  LTG: Position pt will perform bathing:  Shower  At sink   Problem: RH Dressing Goal: LTG Patient will perform upper body dressing (OT) Description: LTG Patient will perform upper body dressing with assist, with/without cues (OT). Flowsheets (Taken 03/14/2024 1241) LTG: Pt will perform upper body dressing with assistance level of: Independent with assistive device Goal: LTG Patient will perform lower body dressing w/assist (OT) Description: LTG: Patient will perform lower body dressing with assist, with/without cues in positioning using equipment (OT) Flowsheets (Taken 03/14/2024 1241) LTG: Pt will perform lower body dressing with assistance level of: Independent with assistive device   Problem: RH Toileting Goal: LTG Patient will perform toileting task (3/3 steps) with assistance level (OT) Description: LTG: Patient will perform toileting task (3/3 steps) with  assistance level (OT)  Flowsheets (Taken 03/14/2024 1241) LTG: Pt will perform toileting task (3/3 steps) with assistance level: Independent with assistive device   Problem: RH Simple Meal Prep Goal: LTG Patient will perform simple meal prep w/assist (OT) Description: LTG: Patient will perform simple meal prep with assistance, with/without cues (OT). Flowsheets (Taken 03/14/2024 1241) LTG: Pt will perform simple meal prep with assistance level of: Independent with assistive device   Problem: RH Laundry Goal: LTG Patient will perform laundry w/assist, cues (OT) Description: LTG: Patient will perform laundry with assistance, with/without cues (OT). Flowsheets (Taken 03/14/2024 1241) LTG: Pt will perform laundry with assistance level of: Independent with assistive device   Problem: RH Light Housekeeping Goal: LTG Patient will perform light housekeeping w/assist (OT) Description: LTG: Patient will perform light housekeeping with assistance, with/without cues (OT). Flowsheets (Taken 03/14/2024 1241) LTG: Pt will perform light housekeeping with assistance level of: Independent with assistive device LTG: Pt will perform light housekeeping w/level of: Ambulate with device   Problem: RH Toilet Transfers Goal: LTG Patient will perform toilet transfers w/assist (OT) Description: LTG: Patient will perform toilet transfers with assist, with/without cues using equipment (OT) Flowsheets (Taken 03/14/2024 1241) LTG: Pt will perform toilet transfers with assistance level of: Independent with assistive device   Problem: RH Tub/Shower Transfers Goal: LTG Patient will perform tub/shower transfers w/assist (OT) Description: LTG: Patient will perform tub/shower transfers with assist, with/without cues using equipment (OT) Flowsheets (Taken 03/14/2024 1241) LTG: Pt will perform tub/shower stall transfers with assistance level of: Independent with assistive device LTG: Pt will perform tub/shower transfers from:  Tub/shower combination

## 2024-03-14 NOTE — Progress Notes (Signed)
 Inpatient Rehabilitation  Patient information reviewed and entered into eRehab system by Feliberto Gottron, M.A., CCC-SLP, Rehab Quality Coordinator.  Information including medical coding, functional ability and quality indicators will be reviewed and updated through discharge.

## 2024-03-14 NOTE — Progress Notes (Deleted)
 Cardiology Office Note:  .   Date:  03/14/2024  ID:  Dave Williams, DOB 08/20/1956, MRN [id number] PCP: Oneal Grout, FNP  [facility] Providers Cardiologist:  Nanetta Batty, MD   History of Present Illness: .   Dave Williams is a 68 y.o. male with a past medical history of CAD s/p recent CABG, HTN, history of TIA, tobacco use, HLD, atrial flutter. Patient is followed by Dr. Allyson Sabal, presents today for a hospital follow up appointment  Patient saw Dr. Allyson Sabal in 11/2023 for evaluation of chest pain. He underwent LHC on 11/28/23 that showed three vessel obstructive CAD, normal LVEDP, normal LV function. Recommended outpatient evaluation for CABG. Echocardiogram on 11/30/23 showed EF 65-70%, no regional wall motion abnormalities, grade I DD, normal RV systolic function, no significant valvular abnormalities. He underwent CABG x3 on 03/04/24. After his surgery, he had intermittent episodes of tachycardia. Initially treated with amiodarone bolus, later transitioned to PO amiodarone and metoprolol. On 03/08/24, patient had a syncopal episode while working with PT. His metoprolol dose was decreased, but then he had recurrent tachycardia. EKG showed atrial flutter with 2:1 conduction. IV amiodarone was resumed. He was ultimately discharged on eliquis 5 mg BID, PO amiodarone, metoprolol succinate 25 mg daily. [facility]   CAD s/p CABG - Patient underwent CABG x3 in 02/2024  -  - Continue ASA 81 mg daily (on reduced dose after CABG after starting eliquis use)  - Continue lipitor 80 mg daily  - Continue metoprolol succinate 25 mg daily   Post operative atrial flutter  - After CABG, patient had episodes of tachycardia and was found to be in atrial flutter. Treated with IV amiodarone, discharged on PO amiodarone 400 mg x5 days, followed by 200 mg daily  - Continue amiodarone 200 mg daily - recommended that this be continued for at least 30 days, up to 3 months after  CABG  - Continue eliquis 5 mg BID. Plan to continue for a few months after stopping amiodarone, may be able to discontinue in the future  - Continue metoprolol succinate 25 mg daily   HLD  - LDL 50, HDL 22, triglycerides 112, total cholesterol 94 in 02/2024  - Continue lipitor 80 mg daily    HTN  Orthostatic hypotension  - After his CABG, patient had orthostatic hypotension while working with PT. Suspected this was do to deconditioning and daily flomax use. Flomax was held  -   ROS: ***  Studies Reviewed: .        *** Risk Assessment/Calculations:   {Does this patient have ATRIAL FIBRILLATION?:[phone]}         Physical Exam:   VS:  There were no vitals taken for this visit.   Wt Readings from Last 3 Encounters:  03/14/24 213 lb 13.5 oz (97 kg)  03/13/24 224 lb (101.6 kg)  02/29/24 224 lb 12.8 oz (102 kg)    GEN: Well nourished, well developed in no acute distress NECK: No JVD; No carotid bruits CARDIAC: ***RRR, no murmurs, rubs, gallops RESPIRATORY:  Clear to auscultation without rales, wheezing or rhonchi  ABDOMEN: Soft, non-tender, non-distended EXTREMITIES:  No edema; No deformity   ASSESSMENT AND PLAN: .   *** {The patient has an active order for outpatient cardiac rehabilitation.   Please indicate if the patient is ready to start. Do NOT delete this.  It will auto delete.  Refresh note, then sign.  Click here to document readiness and see contraindications.  :1}  Cardiac Rehabilitation Eligibility Assessment      {Are you ordering a CV Procedure (e.g. stress test, cath, DCCV, TEE, etc)?   Press F2        :[id number]}  Dispo: ***  Signed, Jonita Albee, PA-C

## 2024-03-14 NOTE — Progress Notes (Signed)
 Physical Therapy Session Note  Patient Details  Name: Dave Williams MRN: [id number] Date of Birth: 03/31/1956  Today's Date: 03/14/2024 PT Individual Time: 5284-1324 PT Individual Time Calculation (min): 56 min   Short Term Goals: Week 1:  PT Short Term Goal 1 (Week 1): STG=LTG due to LOS  Skilled Therapeutic Interventions/Progress Updates:   Received pt semi-reclined in bed, pt agreeable to PT treatment, and reported mild pain in chest (unrated and decliend pain medication). Session with emphasis on functional mobility/transfers, generalized strengthening and endurance, dynamic standing balance/coordination, simulated car transfers, and gait training. Pt transferred semi-reclined<>sitting R EOB with HOB elevated with supervision hugging heart pillow to adhere to sternal precautions.   Pt performed all transfers without AD and CGA throughout session. Pt transported to/from room in [facility] dependently for time management purposes. Pt performed simulated car transfer without AD and CGA. Pt then ambulated 61ft on uneven surfaces (ramp) without AD and min A. Pt performed seated BLE strengthening on Nustep at level 3 for 8 minutes for a total of 325 steps with emphasis on cardiovascular endurance and BLE strengthening. Pt then ambulated 141ft without AD and light min A with close WC follow. Pt required intermittent rest breaks due to dizziness but only reporting 2/10 fatigue at end of session. Returned to room, ambulated 34ft without AD and CGA to bed, and transferred into supine with supervision. Concluded session with pt semi-reclined in bed, needs within reach, and bed alarm on.   Orthostatic Vitals (thigh high teds donned): BP semi-reclined in bed: 118/65 BP sitting EOB: 107/63 BP standing EOB: 99/55 BP sitting after car transfer: 100/64 BP sitting after ambulating: 132/68   Therapy Documentation Precautions:  Precautions Precautions: Fall, Sternal Precaution/Restrictions Comments: watch  orthostatics Restrictions Weight Bearing Restrictions Per Provider Order: No Other Position/Activity Restrictions: sternal precautions  Therapy/Group: Individual Therapy Marlana Salvage Zaunegger Blima Rich PT, DPT 03/14/2024, 7:18 AM

## 2024-03-14 NOTE — Evaluation (Signed)
 Physical Therapy Assessment and Plan  Patient Details  Name: Dave Williams MRN: [id number] Date of Birth: [birth year]  PT Diagnosis: Abnormal posture, Abnormality of gait, Difficulty walking, Dizziness and giddiness, and Muscle weakness Rehab Potential: Good ELOS: 7-10 days   Today's Date: 03/14/2024 PT Individual Time: 0815-0925 PT Individual Time Calculation (min): 70 min    Hospital Problem: Principal Problem:   Debility   Past Medical History:  Past Medical History:  Diagnosis Date   Chronic left hip pain    Compression fracture of spine (HCC)    multiple compression Fx with disc bulge   Coronary artery disease    Hypertension    Pneumonia    As a teenager   Stroke [facility]) 2022   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   Past Surgical History:  Past Surgical History:  Procedure Laterality Date   BACK SURGERY     Radiocartography T2-T12   CORONARY ARTERY BYPASS GRAFT N/A 03/04/2024   Procedure: CORONARY ARTERY BYPASS GRAFTING (CABG) TIMES THREE UTILIZING LEFT INTERNAL MAMMARY ARTERY AND ENDOSCOPIC VEIN HARVEST RIGHT GREATER SAPHENOUS VEIN;  Surgeon: Corliss Skains, MD;  Location: [facility] OR;  Service: Open Heart Surgery;  Laterality: N/A;   KNEE ARTHROSCOPY Bilateral    LEFT HEART CATH AND CORONARY ANGIOGRAPHY N/A 11/28/2023   Procedure: LEFT HEART CATH AND CORONARY ANGIOGRAPHY;  Surgeon: Swaz[place], Peter M, MD;  Location: [facility] INVASIVE CV LAB;  Service: Cardiovascular;  Laterality: N/A;   PILONIDAL CYST EXCISION     In patients 20s   TEE WITHOUT CARDIOVERSION N/A 03/04/2024   Procedure: ECHOCARDIOGRAM, TRANSESOPHAGEAL;  Surgeon: Corliss Skains, MD;  Location: [facility] OR;  Service: Open Heart Surgery;  Laterality: N/A;    Assessment & Plan Clinical Impression: Patient is a 68 y.o. year old male with  history of HTN, chronic left hip pain, TIA, substance abuse in the past, tobacco use, unstable angina who was found  to have CAD and admitted on 03/04/24 for CABG X # by Dr. Cliffton Asters. Post op weaned without difficulty and pulmonary hygiene recommended for mucous plugging. He was found to have hematuria on foley removal and noted to have retention of 400 cc urine on 03/25 therefore Flomax added and foley replaced.   He developed SVT with HR in 140's post op  treated with amiodarone bolus and BB and transitioned to po amiodarone. He had orthostatic episode w/ syncope  with PT on 03/28 and BB decreased but with recurrence of SVT.  Dr. Royann Shivers consulted for input and recommended Eliquis with holding of Flomax as soon as able. Has required IV diuresis intermittently for fluid overload.   Foley removed on 03/30 and reported to be voiding without difficulty. He has had issues with constipation which have resolved with lactulose and enema. He is showing improvement with activity and therapy has been working with him. He is  limited by weakness, dizziness, orthostatic hypotension with drop in BP to 90's on standing, anxiety as well as sternal precautions. 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 Family in [place] and ex-girlfriend in SNF.  Reports chronic thoracic back pain due to compression fractures controlled with yearly procedural treatments.  CIR recommended due to functional decline.   Patient currently requires min with mobility secondary to muscle weakness, decreased cardiorespiratoy endurance, and decreased standing balance, decreased postural control, decreased balance strategies, and difficulty maintaining precautions.  Prior to hospitalization, patient  was independent  with mobility and lived with Alone in a House home.  Home access is  Ramped entrance.  Patient will benefit from skilled PT intervention to maximize safe functional mobility, minimize fall risk, and decrease caregiver burden for planned discharge home with intermittent assist.  Anticipate patient will benefit from follow up OP  at discharge.  PT - End of Session Activity Tolerance: Tolerates 30+ min activity with multiple rests Endurance Deficit: Yes Endurance Deficit Description: easily fatigued with minimal activity PT Assessment Rehab Potential (ACUTE/IP ONLY): Good PT Barriers to Discharge: Decreased caregiver support;Wound Care;Weight bearing restrictions;Other (comments) PT Barriers to Discharge Comments: sternal precautions, decreased balance/coordination, global weakness/deconditioning PT Patient demonstrates impairments in the following area(s): Balance;Edema;Endurance;Sensory;Skin Integrity PT Transfers Functional Problem(s): Bed Mobility;Bed to Chair;Car;Furniture PT Locomotion Functional Problem(s): Ambulation;Wheelchair Mobility;Stairs PT Plan PT Intensity: Minimum of 1-2 x/day ,45 to 90 minutes PT Frequency: 5 out of 7 days PT Duration Estimated Length of Stay: 7-10 days PT Treatment/Interventions: Ambulation/gait training;Discharge planning;Functional mobility training;Psychosocial support;Therapeutic Activities;Balance/vestibular training;Disease management/prevention;Neuromuscular re-education;Skin care/wound management;Therapeutic Exercise;Wheelchair propulsion/positioning;DME/adaptive equipment instruction;Pain management;Splinting/orthotics;UE/LE Strength taining/ROM;Community reintegration;Patient/family education;Stair training;UE/LE Coordination activities PT Transfers Anticipated Outcome(s): Mod I with LRAD PT Locomotion Anticipated Outcome(s): Mod I with LRAD PT Recommendation Follow Up Recommendations: Outpatient PT Patient destination: Home Equipment Recommended: To be determined Equipment Details: has none   PT Evaluation Precautions/Restrictions Precautions Precautions: Fall;Sternal Precaution Booklet Issued: No Precaution/Restrictions Comments: watch orthostatics Restrictions Weight Bearing Restrictions Per Provider Order: No RUE Weight Bearing Per Provider Order: Non weight  bearing LUE Weight Bearing Per Provider Order: Non weight bearing Other Position/Activity Restrictions: sternal precautions Pain Interference   Home Living/Prior Functioning Home Living Available Help at Discharge: Friend(s);Available PRN/intermittently;Neighbor Type of Home: House Home Access: Ramped entrance Home Layout: One level Bathroom Shower/Tub: Armed forces operational officer Accessibility: Yes Additional Comments: has no equipment  Lives With: Alone Prior Function Level of Independence: Independent with gait;Independent with basic ADLs;Independent with transfers;Independent with homemaking with ambulation  Able to Take Stairs?: No Driving: Yes 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 Vision/Perception  Vision - History Ability to See in Adequate Light: 0 Adequate Perception Perception: Within Systems developer Praxis Praxis: WFL  Cognition Overall Cognitive Status: Within Functional Limits for tasks assessed Arousal/Alertness: Awake/alert Orientation Level: Oriented X4 Attention: Alternating Memory: Appears intact Awareness: Appears intact Problem Solving: Appears intact Safety/Judgment: Appears intact Sensation Sensation Light Touch: Appears Intact Hot/Cold: Appears Intact Proprioception: Appears Intact Stereognosis: Not tested Additional Comments: decreased sensation along L medial malleoli due to graft Coordination Gross Motor Movements are Fluid and Coordinated: No Fine Motor Movements are Fluid and Coordinated: Yes Coordination and Movement Description: global weakness and deconditioning with new sternal precautions Finger Nose Finger Test: [facility] bilaterally Heel Shin Test: slow and decreased ROM Motor  Motor Motor: Within Functional Limits Motor - Skilled Clinical Observations: global weakness and deconditioning with new sternal precautions  Trunk/Postural Assessment  Cervical  Assessment Cervical Assessment: Within Functional Limits Thoracic Assessment Thoracic Assessment: Exceptions to [facility] (old thoracic injury) Lumbar Assessment Lumbar Assessment: Within Functional Limits Postural Control Postural Control: Within Functional Limits  Balance Balance Balance Assessed: Yes Static Sitting Balance Static Sitting - Balance Support: No upper extremity supported;Feet supported Static Sitting - Level of Assistance: 7: Independent Dynamic Sitting Balance Dynamic Sitting - Balance Support: Right upper extremity supported;Left upper extremity supported;Feet supported Dynamic Sitting - Level of Assistance: 5: Stand by assistance (supervision) Static Standing Balance Static Standing - Balance Support: During functional activity;No upper extremity supported Static  Standing - Level of Assistance: 5: Stand by assistance (CGA) Dynamic Standing Balance Dynamic Standing - Balance Support: During functional activity;No upper extremity supported Dynamic Standing - Level of Assistance: 5: Stand by assistance (CGA) Dynamic Standing - Comments: with transfers Extremity Assessment  RLE Assessment RLE Assessment: Exceptions to [facility] General Strength Comments: tested sitting EOB RLE Strength Right Hip Flexion: 3+/5 Right Hip ABduction: 4-/5 Right Hip ADduction: 4-/5 Right Knee Flexion: 4-/5 Right Knee Extension: 4-/5 Right Ankle Dorsiflexion: 4/5 Right Ankle Plantar Flexion: 4/5 LLE Assessment LLE Assessment: Exceptions to [facility] General Strength Comments: tested sitting EOB LLE Strength Left Hip Flexion: 3+/5 Left Hip ABduction: 4-/5 Left Hip ADduction: 4-/5 Left Knee Flexion: 4-/5 Left Knee Extension: 4/5 Left Ankle Dorsiflexion: 4/5  Care Tool Care Tool Bed Mobility Roll left and right activity   Roll left and right assist level: Supervision/Verbal cueing    Sit to lying activity        Lying to sitting on side of bed activity   Lying to sitting on side of bed  assist level: the ability to move from lying on the back to sitting on the side of the bed with no back support.: Minimal Assistance - Patient > 75% (HOB elevated)     Care Tool Transfers Sit to stand transfer   Sit to stand assist level: Contact Guard/Touching assist    Chair/bed transfer   Chair/bed transfer assist level: Contact Guard/Touching assist    Licensed conveyancer transfer activity did not occur: Safety/medical concerns (dizziness, lightheadedness)        Care Tool Locomotion Ambulation Ambulation activity did not occur: Safety/medical concerns (dizziness, lightheadedness)        Walk 10 feet activity Walk 10 feet activity did not occur: Safety/medical concerns (dizziness, lightheadedness)       Walk 50 feet with 2 turns activity Walk 50 feet with 2 turns activity did not occur: Safety/medical concerns (dizziness, lightheadedness)      Walk 150 feet activity Walk 150 feet activity did not occur: Safety/medical concerns (dizziness, lightheadedness)      Walk 10 feet on uneven surfaces activity Walk 10 feet on uneven surfaces activity did not occur: Safety/medical concerns (dizziness, lightheadedness)      Stairs Stair activity did not occur: Safety/medical concerns (dizziness, lightheadedness)        Walk up/down 1 step activity Walk up/down 1 step or curb (drop down) activity did not occur: Safety/medical concerns (dizziness, lightheadedness)      Walk up/down 4 steps activity Walk up/down 4 steps activity did not occur: Safety/medical concerns (dizziness, lightheadedness)      Walk up/down 12 steps activity Walk up/down 12 steps activity did not occur: Safety/medical concerns (dizziness, lightheadedness)      Pick up small objects from floor Pick up small object from the floor (from standing position) activity did not occur: Safety/medical concerns (dizziness, lightheadedness)      Wheelchair Is the patient using a wheelchair?: Yes Type of Wheelchair:  Manual Wheelchair activity did not occur: Safety/medical concerns (dizziness, lightheadedness)      Wheel 50 feet with 2 turns activity Wheelchair 50 feet with 2 turns activity did not occur: Safety/medical concerns (dizziness, lightheadedness)    Wheel 150 feet activity Wheelchair 150 feet activity did not occur: Safety/medical concerns (dizziness, lightheadedness)      Refer to Care Plan for Long Term Goals  SHORT TERM GOAL WEEK 1 PT Short Term Goal 1 (Week 1): STG=LTG due to LOS  Recommendations for other services: None  Skilled Therapeutic Intervention Evaluation completed (see details above and below) with education on PT POC and goals and individual treatment initiated with focus on functional mobility/transfers, generalized strengthening and endurance, dynamic standing balance/coordination, and BP management. Received pt semi-reclined in bed, pt educated on PT evaluation, CIR policies, and therapy schedule and agreeable. Pt denied any pain during session.   Provided pt with 18x18 manual WC and RW. Pt transferred semi-reclined<>sitting R EOB with HOB elevated and min A for trunk control with good adherance to sternal precautions. Pt lightheaded upon sitting up. Pt performed transfers without AD and CGA throughout session. Donned thigh high teds dependently and MD arrived for morning rounds. Concluded session with pt sitting in WC, needs within reach, and chair pad alarm on. Safety plan updated.   Vitals: BP supine at rest: 109/67 - pt asymptomatic BP sitting EOB: 110/65 - pt reporting mild lightheadedness that subsided with rest BP standing: 105/51  BP seated with thigh high teds: 130/70  Mobility Bed Mobility Bed Mobility: Rolling Right;Supine to Sit Rolling Right: Supervision/verbal cueing Supine to Sit: Minimal Assistance - Patient > 75% Transfers Transfers: Sit to Stand;Stand to Sit;Stand Pivot Transfers Sit to Stand: Contact Guard/Touching assist Stand to Sit: Contact  Guard/Touching assist Stand Pivot Transfers: Contact Guard/Touching assist Transfer (Assistive device): None Locomotion  Gait Ambulation: No Gait Gait: No Stairs / Additional Locomotion Stairs: No Wheelchair Mobility Wheelchair Mobility: No   Discharge Criteria: Patient will be discharged from PT if patient refuses treatment 3 consecutive times without medical reason, if treatment goals not met, if there is a change in medical status, if patient makes no progress towards goals or if patient is discharged from hospital.  The above assessment, treatment plan, treatment alternatives and goals were discussed and mutually agreed upon: by patient  Huntley Dec PT, DPT 03/14/2024, 12:20 PM

## 2024-03-14 NOTE — Evaluation (Signed)
 Occupational Therapy Assessment and Plan  Patient Details  Name: Dave Williams MRN: [id number] Date of Birth: [birth year]  OT Diagnosis: acute pain, muscle weakness (generalized), pain in joint, and pain in thoracic spine Rehab Potential: Rehab Potential (ACUTE ONLY): Good ELOS: 7-9 days   Today's Date: 03/14/2024 OT Individual Time: 1050-1200 OT Individual Time Calculation (min): 70 min     Hospital Problem: Principal Problem:   Debility   Past Medical History:  Past Medical History:  Diagnosis Date   Chronic left hip pain    Compression fracture of spine (HCC)    multiple compression Fx with disc bulge   Coronary artery disease    Hypertension    Pneumonia    As a teenager   Stroke (HCC) 2022   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   Past Surgical History:  Past Surgical History:  Procedure Laterality Date   BACK SURGERY     Radiocartography T2-T12   CORONARY ARTERY BYPASS GRAFT N/A 03/04/2024   Procedure: CORONARY ARTERY BYPASS GRAFTING (CABG) TIMES THREE UTILIZING LEFT INTERNAL MAMMARY ARTERY AND ENDOSCOPIC VEIN HARVEST RIGHT GREATER SAPHENOUS VEIN;  Surgeon: Corliss Skains, MD;  Location: [facility] OR;  Service: Open Heart Surgery;  Laterality: N/A;   KNEE ARTHROSCOPY Bilateral    LEFT HEART CATH AND CORONARY ANGIOGRAPHY N/A 11/28/2023   Procedure: LEFT HEART CATH AND CORONARY ANGIOGRAPHY;  Surgeon: [place], Peter M, MD;  Location[facility] INVASIVE CV LAB;  Service: Cardiovascular;  Laterality: N/A;   PILONIDAL CYST EXCISION     In patients 20s   TEE WITHOUT CARDIOVERSION N/A 03/04/2024   Procedure: ECHOCARDIOGRAM, TRANSESOPHAGEAL;  Surgeon: Corliss Skains, MD;  Location: [facility] OR;  Service: Open Heart Surgery;  Laterality: N/A;    Assessment & Plan Clinical Impression: Dave Williams is a 68 year old male with history of HTN, chronic left hip pain, TIA, substance abuse in the past, tobacco use, unstable  angina who was found to have CAD and admitted on 03/04/24 for CABG X # by Dr. Cliffton Asters. Post op weaned without difficulty and pulmonary hygiene recommended for mucous plugging. He was found to have hematuria on foley removal and noted to have retention of 400 cc urine on 03/25 therefore Flomax added and foley replaced.   He developed SVT with HR in 140's post op  treated with amiodarone bolus and BB and transitioned to po amiodarone. He had orthostatic episode w/ syncope  with PT on 03/28 and BB decreased but with recurrence of SVT.  Dr. Royann Shivers consulted for input and recommended Eliquis with holding of Flomax as soon as able. Has required IV diuresis intermittently for fluid overload.Patient transferred to CIR on 03/13/2024 .    Patient currently requires min with basic self-care skills secondary to muscle weakness and decreased cardiorespiratoy endurance.  Prior to hospitalization, patient could complete BADL/IADL with independent .  Patient will benefit from skilled intervention to decrease level of assist with basic self-care skills, increase independence with basic self-care skills, and increase level of independence with iADL prior to discharge home independently.  Anticipate patient will require  occasional assistance for transportation  and follow up home health.  OT - End of Session Activity Tolerance: Decreased this session Endurance Deficit: Yes OT Assessment Rehab Potential (ACUTE ONLY): Good OT Barriers to Discharge: Decreased caregiver support OT Barriers to Discharge Comments: Lives alone - has intermittent support from neighbors/ friends OT Patient demonstrates impairments in  the following area(s): Balance;Endurance;Safety;Pain;Sensory OT Basic ADL's Functional Problem(s): Bathing;Dressing;Toileting OT Advanced ADL's Functional Problem(s): Simple Meal Preparation;Laundry OT Transfers Functional Problem(s): Toilet;Tub/Shower OT Plan OT Intensity: Minimum of 1-2 x/day, 45 to 90  minutes OT Frequency: 5 out of 7 days OT Duration/Estimated Length of Stay: 7-9 days OT Treatment/Interventions: Balance/vestibular training;Disease mangement/prevention;Patient/family education;Self Care/advanced ADL retraining;Therapeutic Exercise;Discharge planning;DME/adaptive equipment instruction;Functional mobility training;Pain management;Skin care/wound managment;Therapeutic Activities;UE/LE Strength taining/ROM OT Self Feeding Anticipated Outcome(s): Independent OT Basic Self-Care Anticipated Outcome(s): Mod I OT Toileting Anticipated Outcome(s): Mod I OT Bathroom Transfers Anticipated Outcome(s): Mod I OT Recommendation Patient destination: Home Follow Up Recommendations: Home health OT Equipment Recommended: 3 in 1 bedside comode;To be determined Equipment Details: Has a shower seat with back he bought for ex-girlfriend.  Hasn't been used - has tub/shower.  May be able to use or may need to purchase a tub trasnfer bench.   OT Evaluation Precautions/Restrictions  Precautions Precautions: Fall;Sternal Precaution Booklet Issued: No Precaution/Restrictions Comments: watch orthostatics Restrictions Weight Bearing Restrictions Per Provider Order: No RUE Weight Bearing Per Provider Order: Non weight bearing LUE Weight Bearing Per Provider Order: Non weight bearing Other Position/Activity Restrictions: sternal precautions   Pain Pain Assessment Pain Score: 0-No pain Home Living/Prior Functioning Home Living Family/patient expects to be discharged to:: Private residence Living Arrangements: Alone Available Help at Discharge: Friend(s), Available PRN/intermittently, Neighbor Type of Home: House Home Access: Ramped entrance Home Layout: One level Bathroom Shower/Tub: Engineer, manufacturing systems: Standard Bathroom Accessibility: Yes Additional Comments: has no equipment  Lives With: Alone IADL History Homemaking Responsibilities: Yes Meal Prep Responsibility:  Primary Laundry Responsibility: Primary Cleaning Responsibility: Primary Bill Paying/Finance Responsibility: Primary Shopping Responsibility: Primary Child Care Responsibility: No Current License: Yes Mode of Transportation: Car Occupation: On disability 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, has 2 cats Prior Function Level of Independence: Independent with gait, Independent with basic ADLs, Independent with transfers, Independent with homemaking with ambulation  Able to Take Stairs?: No Driving: Yes 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 Baseline Vision/History: 1 Wears glasses Ability to See in Adequate Light: 0 Adequate Patient Visual Report: No change from baseline;Blurring of vision (mild increase in blurriness since surgery) Vision Assessment?: No apparent visual deficits Perception  Perception: Within Functional Limits Praxis Praxis: WFL Cognition Cognition Overall Cognitive Status: Within Functional Limits for tasks assessed Arousal/Alertness: Awake/alert Orientation Level: Person;Place;Situation Person: Oriented Place: Oriented Situation: Oriented Memory: Appears intact Attention: Alternating Awareness: Appears intact Problem Solving: Appears intact Safety/Judgment: Appears intact Brief Interview for Mental Status (BIMS) Repetition of Three Words (First Attempt): 3 Temporal Orientation: Year: Correct Temporal Orientation: Month: Accurate within 5 days Temporal Orientation: Day: Correct Recall: "Sock": Yes, no cue required Recall: "Blue": Yes, no cue required Recall: "Bed": Yes, no cue required BIMS Summary Score: 15 Sensation Sensation Light Touch: Appears Intact Hot/Cold: Appears Intact Proprioception: Appears Intact Stereognosis: Not tested Additional Comments: decreased sensation along L medial malleoli due to graft Coordination Gross Motor Movements are Fluid and  Coordinated: No Fine Motor Movements are Fluid and Coordinated: Yes Coordination and Movement Description: global weakness and deconditioning with new sternal precautions Finger Nose Finger Test: [facility] bilaterally Heel Shin Test: slow and decreased ROM Motor  Motor Motor: Within Functional Limits Motor - Skilled Clinical Observations: global weakness and deconditioning with new sternal precautions  Trunk/Postural Assessment  Cervical Assessment Cervical Assessment: Within Functional Limits Thoracic Assessment Thoracic Assessment: Exceptions to [facility] (old thoracic injury) Lumbar Assessment Lumbar Assessment: Within Functional Limits Postural Control  Postural Control: Within Functional Limits  Balance Balance Balance Assessed: Yes Static Sitting Balance Static Sitting - Balance Support: No upper extremity supported;Feet supported Static Sitting - Level of Assistance: 7: Independent Dynamic Sitting Balance Dynamic Sitting - Balance Support: Right upper extremity supported;Left upper extremity supported;Feet supported Dynamic Sitting - Level of Assistance: 5: Stand by assistance (supervision) Static Standing Balance Static Standing - Balance Support: During functional activity;No upper extremity supported Static Standing - Level of Assistance: 5: Stand by assistance (CGA) Dynamic Standing Balance Dynamic Standing - Balance Support: During functional activity;No upper extremity supported Dynamic Standing - Level of Assistance: 5: Stand by assistance (CGA) Dynamic Standing - Comments: with transfers Extremity/Trunk Assessment RUE Assessment RUE Assessment: Within Functional Limits General Strength Comments: Not formally assessed due to sternal precautions LUE Assessment LUE Assessment: Within Functional Limits General Strength Comments: Not formally assessed due to sternal precautions  Care Tool Care Tool Self Care Eating   Eating Assist Level: Independent    Oral Care    Oral  Care Assist Level: Set up assist    Bathing   Body parts bathed by patient: Right arm;Left arm;Chest;Abdomen;Front perineal area;Right upper leg;Left upper leg;Face Body parts bathed by helper: Buttocks;Left lower leg;Right lower leg   Assist Level: Minimal Assistance - Patient > 75%    Upper Body Dressing(including orthotics)   What is the patient wearing?: Pull over shirt   Assist Level: Supervision/Verbal cueing    Lower Body Dressing (excluding footwear)   What is the patient wearing?: Pants Assist for lower body dressing: Minimal Assistance - Patient > 75%    Putting on/Taking off footwear   What is the patient wearing?: Socks;Ted hose Assist for footwear: Maximal Assistance - Patient 25 - 49%       Care Tool Toileting Toileting activity Toileting Activity did not occur (Clothing management and hygiene only): N/A (no void or bm)       Care Tool Bed Mobility Roll left and right activity   Roll left and right assist level: Supervision/Verbal cueing    Sit to lying activity        Lying to sitting on side of bed activity   Lying to sitting on side of bed assist level: the ability to move from lying on the back to sitting on the side of the bed with no back support.: Minimal Assistance - Patient > 75% (HOB elevated)     Care Tool Transfers Sit to stand transfer   Sit to stand assist level: Contact Guard/Touching assist    Chair/bed transfer   Chair/bed transfer assist level: Contact Guard/Touching assist     Toilet transfer    UTA     Care Tool Cognition  Expression of Ideas and Wants Expression of Ideas and Wants: 4. Without difficulty (complex and basic) - expresses complex messages without difficulty and with speech that is clear and easy to understand  Understanding Verbal and Non-Verbal Content Understanding Verbal and Non-Verbal Content: 4. Understands (complex and basic) - clear comprehension without cues or repetitions   Memory/Recall Ability  Memory/Recall Ability : Current season;That he or she is in a hospital/hospital unit   Refer to Care Plan for Long Term Goals  SHORT TERM GOAL WEEK 1 OT Short Term Goal 1 (Week 1): Patient will transfer to toilet with close supervision following set up OT Short Term Goal 2 (Week 1): Patient will don socks with min assist OT Short Term Goal 3 (Week 1): STG=LTG due to short LOS  Recommendations for other  services: None    Skilled Therapeutic Intervention: Patient received seated in recliner eager to wash hair and shave.  Patient declined shower despite explanation of safety - covering incisions, seated shower - as he was hesitant to remove compression hose.  Able to assist patient wash hair at sink, then shave and sponge bathe.  Patient very focused on the smell of his clothing (patient was a smoker prior to surgery.)  Patient very pleasant throughout session, eager for improvement but reasonably cautious with performance.  Patient left up in bed at end of session with bed alarm engaged and call bell/ personal items in reach.   ADL ADL Equipment Provided: Reacher Eating: Independent Where Assessed-Eating: Chair Grooming: Setup Where Assessed-Grooming: Sitting at sink Upper Body Bathing: Setup Where Assessed-Upper Body Bathing: Sitting at sink Lower Body Bathing: Minimal assistance Where Assessed-Lower Body Bathing: Sitting at sink;Standing at sink Upper Body Dressing: Setup;Minimal cueing Where Assessed-Upper Body Dressing: Sitting at sink Lower Body Dressing: Maximal assistance Where Assessed-Lower Body Dressing: Sitting at sink;Standing at sink Toileting: Unable to assess Toilet Transfer: Unable to assess Toilet Transfer Method: Unable to assess Tub/Shower Transfer: Unable to assess Tub/Shower Transfer Method: Unable to assess Film/video editor: Unable to assess Film/video editor Method: Unable to assess Mobility  Bed Mobility Bed Mobility: Rolling Right;Supine to  Sit Rolling Right: Supervision/verbal cueing Supine to Sit: Minimal Assistance - Patient > 75% Transfers Sit to Stand: Contact Guard/Touching assist Stand to Sit: Contact Guard/Touching assist   Discharge Criteria: Patient will be discharged from OT if patient refuses treatment 3 consecutive times without medical reason, if treatment goals not met, if there is a change in medical status, if patient makes no progress towards goals or if patient is discharged from hospital.  The above assessment, treatment plan, treatment alternatives and goals were discussed and mutually agreed upon: by patient  Collier Salina 03/14/2024, 12:18 PM

## 2024-03-14 NOTE — Discharge Instructions (Addendum)
 Inpatient Rehab Discharge Instructions  Dave Williams Discharge date and time:  03/21/24  Activities/Precautions/ Functional Status: Activity: no lifting, driving, or strenuous exercise till cleared by MD. Sternal precautions till cleared by Dr. Cliffton Asters. Diet: cardiac diet Wound Care: keep wound clean and dry   Functional status:  ___ No restrictions     ___ Walk up steps independently ___ 24/7 supervision/assistance   ___ Walk up steps with assistance _X__ Intermittent supervision/assistance  __X_ Bathe/dress independently _X__ Walk with walker    ___ Bathe/dress with assistance ___ Walk Independently    ___ Shower independently ___ Walk with assistance    _X__ Shower with assistance _X__ No alcohol     ___ Return to work/school ________  Special Instructions: Need to drink plenty of fluids. Toilet yourself every 4 hours.  2.  Recommend repeat check BMET/CBC in a couple of weeks on post hospital follow up. 3. Sternal precautions and No driving till cleared by Dr. Cliffton Asters.    COMMUNITY REFERRALS UPON DISCHARGE:    RECOMMENDATION IS FOR CARDIAC REHAB FOLLOW UP    Medical Equipment/Items Ordered:ordered rollator, 3 in 1 and tub bench                                                 Agency/Supplier:[employer]  [phone] will need to pay co-pay to receive the equipment    My questions have been answered and I understand these instructions. I will adhere to these goals and the provided educational materials after my discharge from the hospital.  Patient/Caregiver Signature _______________________________ Date __________  Clinician Signature _______________________________________ Date __________  Please bring this form and your medication list with you to all your follow-up doctor's appointments.   ________________________________________________________________________________________________________________________________________   Information on my medicine -  ELIQUIS (apixaban)  This medication education was reviewed with me or my healthcare representative as part of my discharge preparation.  Why was Eliquis prescribed for you? Eliquis was prescribed for you to reduce the risk of a blood clot forming that can cause a stroke if you have a medical condition called atrial fibrillation (a type of irregular heartbeat).  What do You need to know about Eliquis ? Take your Eliquis TWICE DAILY - one tablet in the morning and one tablet in the evening with or without food. If you have difficulty swallowing the tablet whole please discuss with your pharmacist how to take the medication safely.  Take Eliquis exactly as prescribed by your doctor and DO NOT stop taking Eliquis without talking to the doctor who prescribed the medication.  Stopping may increase your risk of developing a stroke.  Refill your prescription before you run out.  After discharge, you should have regular check-up appointments with your healthcare provider that is prescribing your Eliquis.  In the future your dose may need to be changed if your kidney function or weight changes by a significant amount or as you get older.  What do you do if you miss a dose? If you miss a dose, take it as soon as you remember on the same day and resume taking twice daily.  Do not take more than one dose of ELIQUIS at the same time to make up a missed dose.  Important Safety Information A possible side effect of Eliquis is bleeding. You should call your healthcare provider right away if you experience any of the  following: Bleeding from an injury or your nose that does not stop. Unusual colored urine (red or dark brown) or unusual colored stools (red or black). Unusual bruising for unknown reasons. A serious fall or if you hit your head (even if there is no bleeding).  Some medicines may interact with Eliquis and might increase your risk of bleeding or clotting while on Eliquis. To help avoid  this, consult your healthcare provider or pharmacist prior to using any new prescription or non-prescription medications, including herbals, vitamins, non-steroidal anti-inflammatory drugs (NSAIDs) and supplements.  This website has more information on Eliquis (apixaban): http://[email or web address].com/eliquis/home

## 2024-03-14 NOTE — Progress Notes (Addendum)
 PROGRESS NOTE   Subjective/Complaints: Patient is frustrated with bladder scans.  Chest wall soreness overall controlled.  Patient has not trouble sleeping, no specific reason.  He would like a sleep aid.  Therapy using thigh-high teds for orthostatic blood pressure.  ROS: Patient denies fever, new vision changes, dizziness, nausea, vomiting, diarrhea,  shortness of breath or chest pain, headache, or mood change.    Objective:   No results found. Recent Labs    03/14/24 0624  WBC 15.3*  HGB 11.8*  HCT 34.8*  PLT 521*   Recent Labs    03/13/24 0344 03/14/24 0624  NA 133* 133*  K 4.0 4.3  CL 100 102  CO2 24 22  GLUCOSE 106* 112*  BUN 17 17  CREATININE 1.25* 1.14  CALCIUM 8.8* 8.9    Intake/Output Summary (Last 24 hours) at 03/14/2024 1455 Last data filed at 03/14/2024 1610 Gross per 24 hour  Intake --  Output 1209 ml  Net -1209 ml        Physical Exam: Vital Signs Blood pressure 110/74, pulse 71, temperature 98.8 F (37.1 C), temperature source Oral, resp. rate 16, height 6\' 3"  (1.905 m), weight 97 kg, SpO2 94%.  General: No apparent distress HEENT: Head is normocephalic, atraumatic,  oral mucosa pink and moist, decreased dentition  Neck: Supple without JVD or lymphadenopathy Heart: Reg rate and rhythm. No murmurs rubs or gallops Chest: CTA bilaterally without wheezes, rales, or rhonchi; no distress Abdomen: Soft, non-tender, non-distended, bowel sounds positive. Extremities: No clubbing, cyanosis, or edema. Pulses are 2+ Psych: Pt's affect is appropriate. Pt is cooperative Skin: Sternal incision CDI, right leg graft site appears to be healing well   Neuro:     Mental Status: AAOx4, no apparent cognitive deficits, cranial nerves II through XII grossly intact   MOTOR: RUE: At least 4 out of 5 limited by sternal precautions  LUE: At least 4 out of 5 limited by sternal precautions RLE: HF 5/5, KE 5/5,  ADF 5/5, APF 5/5 LLE: HF 5/5, KE 5/5, ADF 5/5, APF 5/5   SENSORY: Normal to touch all 4 extremities, altered on the left side of his surgical incision      Assessment/Plan: 1. Functional deficits which require 3+ hours per day of interdisciplinary therapy in a comprehensive inpatient rehab setting. Physiatrist is providing close team supervision and 24 hour management of active medical problems listed below. Physiatrist and rehab team continue to assess barriers to discharge/monitor patient progress toward functional and medical goals  Care Tool:  Bathing    Body parts bathed by patient: Right arm, Left arm, Chest, Abdomen, Front perineal area, Right upper leg, Left upper leg, Face   Body parts bathed by helper: Buttocks, Left lower leg, Right lower leg     Bathing assist Assist Level: Minimal Assistance - Patient > 75%     Upper Body Dressing/Undressing Upper body dressing   What is the patient wearing?: Pull over shirt    Upper body assist Assist Level: Supervision/Verbal cueing    Lower Body Dressing/Undressing Lower body dressing      What is the patient wearing?: Pants     Lower body assist Assist for lower  body dressing: Minimal Assistance - Patient > 75%     Toileting Toileting Toileting Activity did not occur Press photographer and hygiene only): N/A (no void or bm)  Toileting assist       Transfers Chair/bed transfer  Transfers assist     Chair/bed transfer assist level: Contact Guard/Touching assist     Locomotion Ambulation   Ambulation assist   Ambulation activity did not occur: Safety/medical concerns (dizziness, lightheadedness)          Walk 10 feet activity   Assist  Walk 10 feet activity did not occur: Safety/medical concerns (dizziness, lightheadedness)        Walk 50 feet activity   Assist Walk 50 feet with 2 turns activity did not occur: Safety/medical concerns (dizziness, lightheadedness)         Walk 150  feet activity   Assist Walk 150 feet activity did not occur: Safety/medical concerns (dizziness, lightheadedness)         Walk 10 feet on uneven surface  activity   Assist Walk 10 feet on uneven surfaces activity did not occur: Safety/medical concerns (dizziness, lightheadedness)         Wheelchair     Assist Is the patient using a wheelchair?: Yes Type of Wheelchair: Manual Wheelchair activity did not occur: Safety/medical concerns (dizziness, lightheadedness)         Wheelchair 50 feet with 2 turns activity    Assist    Wheelchair 50 feet with 2 turns activity did not occur: Safety/medical concerns (dizziness, lightheadedness)       Wheelchair 150 feet activity     Assist  Wheelchair 150 feet activity did not occur: Safety/medical concerns (dizziness, lightheadedness)       Blood pressure 110/74, pulse 71, temperature 98.8 F (37.1 C), temperature source Oral, resp. rate 16, height 6\' 3"  (1.905 m), weight 97 kg, SpO2 94%.  Medical Problem List and Plan: 1. 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  Antithrombotics: -DVT/anticoagulation:  Pharmaceutical: Eliquis             -antiplatelet therapy: ASA 3. Pain Management: Oxycodone prn for severe pain.              -4/2 history of substance abuse years ago 4. Mood/Behavior/Sleep: LCSW to follow for evaluation and support.              -antipsychotic agents: N/A 5. Neuropsych/cognition: This patient is capable of making decisions on his own behalf. 6. Skin/Wound Care: Monitor wound for healing.  7. Fluids/Electrolytes/Nutrition:  Monitor I/O. Check CMET in am 8. A fib: Monitor HR TID--continue Amiodarone 400 mg bid             -4/3 HR stable, continue to monitor     03/14/2024    1:36 PM 03/14/2024    8:13 AM 03/14/2024    6:00 AM   Vitals with BMI  Height  6\' 3"    Systolic 110  112  Diastolic 74  62  Pulse 71  67    9. Orthostatic hypotension: Encourage fluid intake. Midodrine added on 04/02.  TED hose started 10. Urinary retention: On Flomax and reports voiding without difficulty.  voiding --Foley discontinued  on 03/30. Educated on need for PVRs 1-2 days to monitor function.    --has had rise in SCr from 0.9-->1.26, recheck labs tomorrow -4/3 PVR 0 and 87, patient refusing bladder scans.  Appears overall stable will discontinue.  Creatinine improved to 1.14 11. Acute blood loss anemia/Leukocytosis:   -Recheck tomorrow, WBC a little higher but has not been checked in a few days 12. Constipation: On dulcolax tabs daily--had BM on 03/31.              -4/2 consider additional medication tomorrow if no Bms  4/3 MiraLAX and sorbitol as needed for mild to moderate constipation 13.  Tobacco abuse: "Smoked his last cigarette on way to surgery"  --Continue nicotine patch.  -Tobacco cessation counseling 14. Impaired fasting glucose: Hgb A1C- 5.8. Continue CM diet.    -CBG stable continue discontinued  LOS: 1 days A FACE TO FACE EVALUATION WAS PERFORMED  Fanny Dance 03/14/2024, 2:55 PM

## 2024-03-14 NOTE — Plan of Care (Signed)
  Problem: RH BOWEL ELIMINATION Goal: RH STG MANAGE BOWEL WITH ASSISTANCE Description: STG Manage Bowel with  Mod I Assistance. Outcome: Progressing   Problem: RH BLADDER ELIMINATION Goal: RH STG MANAGE BLADDER WITH ASSISTANCE Description: STG Manage Bladder With  Mod I Assistance Outcome: Progressing   Problem: RH SKIN INTEGRITY Goal: RH STG SKIN FREE OF INFECTION/BREAKDOWN Description: Manage skin free of infection/breakdown with mod I assistance Outcome: Progressing   Problem: RH SAFETY Goal: RH STG ADHERE TO SAFETY PRECAUTIONS W/ASSISTANCE/DEVICE Description:  Adhere to Safety Precautions With  mod I Assistance/Device. Outcome: Progressing   Problem: RH PAIN MANAGEMENT Goal: RH STG PAIN MANAGED AT OR BELOW PT'S PAIN GOAL Description: <4 w/ prns Outcome: Progressing   Problem: RH KNOWLEDGE DEFICIT GENERAL Goal: RH STG INCREASE KNOWLEDGE OF SELF CARE AFTER HOSPITALIZATION Description: Manage increase knowledge of self care after hospitalization deficit  with mod I assistance using educational materials provided Outcome: Progressing

## 2024-03-14 NOTE — Progress Notes (Signed)
 Inpatient Rehabilitation Care Coordinator Assessment and Plan Patient Details  Name: Dave Williams MRN: [id number] Date of Birth: [birth year]  Today's Date: 03/14/2024  Hospital Problems: Principal Problem:   Debility  Past Medical History:  Past Medical History:  Diagnosis Date   Chronic left hip pain    Compression fracture of spine (HCC)    multiple compression Fx with disc bulge   Coronary artery disease    Hypertension    Pneumonia    As a teenager   Stroke [facility]) 2022   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   Past Surgical History:  Past Surgical History:  Procedure Laterality Date   BACK SURGERY     Radiocartography T2-T12   CORONARY ARTERY BYPASS GRAFT N/A 03/04/2024   Procedure: CORONARY ARTERY BYPASS GRAFTING (CABG) TIMES THREE UTILIZING LEFT INTERNAL MAMMARY ARTERY AND ENDOSCOPIC VEIN HARVEST RIGHT GREATER SAPHENOUS VEIN;  Surgeon: Corliss Skains, MD;  Location: [facility] OR;  Service: Open Heart Surgery;  Laterality: N/A;   KNEE ARTHROSCOPY Bilateral    LEFT HEART CATH AND CORONARY ANGIOGRAPHY N/A 11/28/2023   Procedure: LEFT HEART CATH AND CORONARY ANGIOGRAPHY;  Surgeon: Swaz[place], Peter M, MD;  Location: [facility] INVASIVE CV LAB;  Service: Cardiovascular;  Laterality: N/A;   PILONIDAL CYST EXCISION     In patients 20s   TEE WITHOUT CARDIOVERSION N/A 03/04/2024   Procedure: ECHOCARDIOGRAM, TRANSESOPHAGEAL;  Surgeon: Corliss Skains, MD;  Location: [facility] OR;  Service: Open Heart Surgery;  Laterality: N/A;   Social History:  reports that he has been smoking cigarettes. He has never used smokeless tobacco. He reports that he does not currently use drugs after having used the following drugs: "Crack" cocaine and Marijuana. He reports that he does not drink alcohol.  Family / Support Systems Marital Status: Single Patient Roles: Other (Comment) (sibling/employee) Other Supports: Elsie-sister [phone] lives in  [place] Anticipated Caregiver: Friends and neighbors Ability/Limitations of Caregiver: Intermittent Caregiver Availability: Intermittent Family Dynamics: Close with sister but in [place] and friends and neighbors who will make sure he has what he needs. 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 He will need to be mod/i due to will be home alone wiht intermittent assist from neighbors. He is confident he will reach these goals Recent Psychosocial Issues: other health issues Psychiatric History: No hx seems to be coping appropriately and doing well with all of this. He has quit smoking and feels good about this Substance Abuse History: Quit tobacco 03/04/2024  Patient / Family Perceptions, Expectations & Goals Pt/Family understanding of illness & functional limitations: Pt can explain his  heart surgery and does talk with the MD's involved and feels has a good understanding of his plan moving forward. 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,  Fireman, neighbor, friend, brother Anticipated changes in roles/activities/participation: resume Pt/family expectations/goals: Pt states: " I hope to do well and I feel I will here, I am not concerned about going home alone."  [employer]: None Premorbid Home Care/DME Agencies: None Transportation available at discharge: self Is the patient able to respond to transportation needs?: Yes In the past 12 months, has lack of transportation kept you from medical appointments or from getting medications?: No In the past 12 months, has lack of transportation kept you from meetings, work, or from getting things needed for daily living?: No  Discharge Planning Living Arrangements: Alone Support Systems: Friends/neighbors Type of Residence: Private residence Insurance Resources: Media planner (specify) Radiographer, therapeutic) Financial Resources: Employment, Restaurant manager, fast food Screen Referred: No Living Expenses: Own Money Management: Patient Does the patient have any problems obtaining your medications?: No Home Management: self Patient/Family Preliminary Plans: Return home with neighbors and friends checking in on him. He will need to be mod/i to be alone safely. Will await team's evaluations and work on discharge needs. Care Coordinator Barriers to Discharge: Decreased caregiver support, Lack of/limited family support, Insurance for SNF coverage Care Coordinator Anticipated Follow Up Needs: HH/OP  Clinical Impression Pleasant gentleman who is motivated to do well and recover from his heart surgery. He has given up smoking and feels good about this. Will await therapy evaluations and work on discharge needs.   Lucy Chris 03/14/2024, 12:35 PM

## 2024-03-14 NOTE — Progress Notes (Signed)
 Inpatient Rehabilitation Center Individual Statement of Services  Patient Name:  Dave Williams  Date:  03/14/2024  Welcome to the Inpatient Rehabilitation Center.  Our goal is to provide you with an individualized program based on your diagnosis and situation, designed to meet your specific needs.  With this comprehensive rehabilitation program, you will be expected to participate in at least 3 hours of rehabilitation therapies Monday-Friday, with modified therapy programming on the weekends.  Your rehabilitation program will include the following services:  Physical Therapy (PT), Occupational Therapy (OT), 24 hour per day rehabilitation nursing, Therapeutic Recreaction (TR), Care Coordinator, Rehabilitation Medicine, Nutrition Services, and Pharmacy Services  Weekly team conferences will be held on Wednesday to discuss your progress.  Your Inpatient Rehabilitation Care Coordinator will talk with you frequently to get your input and to update you on team discussions.  Team conferences with you and your family in attendance may also be held.  Expected length of stay: 7-9 days  Overall anticipated outcome:independent with device   Depending on your progress and recovery, your program may change. Your Inpatient Rehabilitation Care Coordinator will coordinate services and will keep you informed of any changes. Your Inpatient Rehabilitation Care Coordinator's name and contact numbers are listed  below.  The following services may also be recommended but are not provided by the Inpatient Rehabilitation Center:  Driving Evaluations Home Health Rehabiltiation Services Outpatient Rehabilitation Services Vocational Rehabilitation   Arrangements will be made to provide these services after discharge if needed.  Arrangements include referral to agencies that provide these services.  Your insurance has been verified to be:  [employer] Your primary doctor is:  Erskine Speed  Pertinent information  will be shared with your doctor and your insurance company.  Inpatient Rehabilitation Care Coordinator:  Dossie Der, [place] [phone] or Luna Glasgow  Information discussed with and copy given to patient by: Lucy Chris, 03/14/2024, 12:36 PM

## 2024-03-15 DIAGNOSIS — I4891 Unspecified atrial fibrillation: Secondary | ICD-10-CM | POA: Diagnosis not present

## 2024-03-15 DIAGNOSIS — R339 Retention of urine, unspecified: Secondary | ICD-10-CM | POA: Diagnosis not present

## 2024-03-15 DIAGNOSIS — K5900 Constipation, unspecified: Secondary | ICD-10-CM | POA: Diagnosis not present

## 2024-03-15 DIAGNOSIS — R5381 Other malaise: Secondary | ICD-10-CM | POA: Diagnosis not present

## 2024-03-15 LAB — CBC WITH DIFFERENTIAL/PLATELET
Abs Immature Granulocytes: 0.33 10*3/uL — ABNORMAL HIGH (ref 0.00–0.07)
Basophils Absolute: 0.1 10*3/uL (ref 0.0–0.1)
Basophils Relative: 1 %
Eosinophils Absolute: 0.5 10*3/uL (ref 0.0–0.5)
Eosinophils Relative: 3 %
HCT: 34.6 % — ABNORMAL LOW (ref 39.0–52.0)
Hemoglobin: 11.7 g/dL — ABNORMAL LOW (ref 13.0–17.0)
Immature Granulocytes: 2 %
Lymphocytes Relative: 21 %
Lymphs Abs: 3.5 10*3/uL (ref 0.7–4.0)
MCH: 30.5 pg (ref 26.0–34.0)
MCHC: 33.8 g/dL (ref 30.0–36.0)
MCV: 90.3 fL (ref 80.0–100.0)
Monocytes Absolute: 1.9 10*3/uL — ABNORMAL HIGH (ref 0.1–1.0)
Monocytes Relative: 12 %
Neutro Abs: 10.4 10*3/uL — ABNORMAL HIGH (ref 1.7–7.7)
Neutrophils Relative %: 61 %
Platelets: 543 10*3/uL — ABNORMAL HIGH (ref 150–400)
RBC: 3.83 MIL/uL — ABNORMAL LOW (ref 4.22–5.81)
RDW: 13 % (ref 11.5–15.5)
WBC: 16.7 10*3/uL — ABNORMAL HIGH (ref 4.0–10.5)
nRBC: 0 % (ref 0.0–0.2)

## 2024-03-15 MED ORDER — SORBITOL 70 % SOLN
45.0000 mL | Freq: Once | Status: DC
  Filled 2024-03-15: qty 60

## 2024-03-15 NOTE — Progress Notes (Signed)
 Occupational Therapy Session Note  Patient Details  Name: Nagi Furio MRN: [id number] Date of Birth: 10-Nov-1956  Today's Date: 03/15/2024 OT Individual Time: 1012-1052 OT Individual Time Calculation (min): 40 min    Short Term Goals: Week 1:  OT Short Term Goal 1 (Week 1): Patient will transfer to toilet with close supervision following set up OT Short Term Goal 2 (Week 1): Patient will don socks with min assist OT Short Term Goal 3 (Week 1): STG=LTG due to short LOS  Skilled Therapeutic Interventions/Progress Updates:  Skilled OT intervention completed with focus on DC planning, functional mobility, IADL education. Pt received seated in w/c, agreeable to session. No pain reported.  Pt declined self-care needs. Transported dependently in w/c <> ADL apartment. Pt reports bathroom set up as tub/shower with curtain. Discussed use of TTB for increasing safety with transferring in/out of shower and for conserving energy during bathing. Advised use of hand held shower head for ease of bathing. Demonstrated method of tucking shower curtain under buttocks to prevent water spillage in floor. Discussed using lateral leans for peri-washing vs standing due to orthostatic hypotension. Pt was able to return demo CGA sit > stand from w/c without AD, CGA ambulatory transfer > TTB and supervision pivot on TTB <> and back to w/c. Cues needed for avoidance of BUE pushing up to stand.  Pt reported bed in apartment is similar height to his at home. Practiced sit <> supine in the standard bed, and pt was able to do so with mod I with good adherence to sternal precautions.   Discussed his plans for meals at DC. Pt reports he prepped about 2 months worth of meals and are in freezer, therefore he will typically only use microwave over the stove to heat up. Discussed safety with mobility in relation to meal prep including staying within sternal precautions.  Back in room, pt stood with CGA and ambulated with CGA >  EOB. Mod I for bed mobility. Pt remained upright in bed, with bed alarm on/activated, and with all needs in reach at end of session.  Vitals -95% O2 sat on RA, 47 bpm HR following activity and at rest (team notified of status as pt reports general weakness)   Therapy Documentation Precautions:  Precautions Precautions: Fall, Sternal Precaution Booklet Issued: No Precaution/Restrictions Comments: watch orthostatics Restrictions Weight Bearing Restrictions Per Provider Order: No Other Position/Activity Restrictions: sternal precautions    Therapy/Group: Individual Therapy  Melvyn Novas, MS, OTR/L  03/15/2024, 12:22 PM

## 2024-03-15 NOTE — Progress Notes (Signed)
 Physical Therapy Session Note  Patient Details  Name: Dave Williams MRN: [id number] Date of Birth: [birth year]  Today's Date: 03/15/2024 PT Individual Time: 0906-1000 PT Individual Time Calculation (min): 54 min   Short Term Goals: Week 1:  PT Short Term Goal 1 (Week 1): STG=LTG due to LOS  Skilled Therapeutic Interventions/Progress Updates: Pt presents semi-reclined in bed and agreeable to therapy.  Pt w/ MD and so missed 6' of therapy time.   Thigh high TEDS donned w/ total A w/ BP then measured at 113/62.  Pt transfers up to sit w/ supervision, although pt starts to pull on siderail before PT can intervene.  Re-educated on sternal precautions.  BP at 98/69 and no symptoms laimed.  Pt transfers to standing w/ CGA and stood for BP measurement w/o symptoms at 87/61.  Pt amb to dayroom w/ CGA and no c/o and BP 119/62.  Pt w/ c/o slight "winded" and O2 at 99% and HR 95, although decreased to 47 bpm (manually checked at 60).  Pt performed multiple gait trials up to 180 w/o c/o symptoms, noted ER of feet which patient states pre-morbid.  Pt negotiated through cone obstacle course and then including tap to cone top w/ occasional LOB when weight shift to LLE.  Pt returned to room and at conclusion of gait states slight dizziness but resolves w/ sitting.  Pt remained sitting in w/c visiting w/ friend.  Nursing notified of all findings.     Therapy Documentation Precautions:  Precautions Precautions: Fall, Sternal Precaution Booklet Issued: No Precaution/Restrictions Comments: watch orthostatics Restrictions Weight Bearing Restrictions Per Provider Order: No RUE Weight Bearing Per Provider Order: Non weight bearing LUE Weight Bearing Per Provider Order: Non weight bearing Other Position/Activity Restrictions: sternal precautions General: PT Amount of Missed Time (min): 6 Minutes PT Missed Treatment Reason: Unavailable (Comment) (w/ MD) Vital Signs: Therapy Vitals Pulse Rate: 62 Pain: soreness  at vein harvest site.      Therapy/Group: Individual Therapy  Lucio Edward 03/15/2024, 12:27 PM

## 2024-03-15 NOTE — Progress Notes (Signed)
 Physical Therapy Session Note  Patient Details  Name: Dave Williams MRN: [id number] Date of Birth: [birth year]  Today's Date: 03/15/2024 PT Individual Time: 4098-1191 PT Individual Time Calculation (min): 27 min   Short Term Goals: Week 1:  PT Short Term Goal 1 (Week 1): STG=LTG due to LOS  Skilled Therapeutic Interventions/Progress Updates:    Session focused on functional mobility including transfer (bed <> w/c <> Nustep and toilet transfer) and generalized strengthening and cardiovascular endurance on Nustep on level 3 initially x 7 min and level 4 x 1 min and back down to level 3 for a total of 10 min. Pt performed transfers with overall CGA without device with cues for technique and occasional light min to facilitate anterior weightshift. CGA once up in standing for steps to transfer. End of session pt reporting urge for BM - transferred with CGA to [facility] over toilet in bathroom and left in care of nurse. Also notified nurse of HR during this session.   Therapy Documentation Precautions:  Precautions Precautions: Fall, Sternal Precaution Booklet Issued: No Precaution/Restrictions Comments: watch orthostatics Restrictions Weight Bearing Restrictions Per Provider Order: No RUE Weight Bearing Per Provider Order: Non weight bearing LUE Weight Bearing Per Provider Order: Non weight bearing Other Position/Activity Restrictions: sternal precautions  Vital Signs: HR = 95 bpm; O2 = 95-110% while on Nustep HR = 46-49 bpm; O2= 98% after ~ 7 min on Nustep after increasing work load to level 4 (from 3) - mild symptoms reported of "feeling a little off" but "not as bad as this morning"  Pain: Denies pain.    Therapy/Group: Individual Therapy  Karolee Stamps Darrol Poke, PT, DPT, CBIS  03/15/2024, 1:37 PM

## 2024-03-15 NOTE — Progress Notes (Signed)
 Occupational Therapy Note  Patient Details  Name: Dave Williams MRN: [id number] Date of Birth: [birth year]  Today's Date: 03/15/2024 OT Missed Time: 60 Minutes Missed Time Reason: Patient unwilling/refused to participate without medical reason  Pt missed 60 mins of group session d/t refusal to attend coping/stress mgmt group.    Pollyann Glen [facility] 03/15/2024, 3:21 PM

## 2024-03-15 NOTE — Progress Notes (Signed)
 Per PT patient's HR went below 50 during session. Patient was alert and asymptomatic.   Patient seen in wheelchair, A&O, talking to family member. Nurse checked patient's radial pulse: 62  Patient encouraged to alert nurse if feeling dizzy, sob, and assistance when needed.

## 2024-03-15 NOTE — Progress Notes (Signed)
 PROGRESS NOTE   Subjective/Complaints: Has not had a bowel movement in few days.  Reports blood pressures doing better with TED hose, reports not having symptoms of orthostatic hypotension since this was started.  ROS: Patient denies fever, new vision changes, dizziness, nausea, vomiting, diarrhea,  shortness of breath or chest pain, headache, or mood change. + Constipation    Objective:   No results found. Recent Labs    03/14/24 0624 03/15/24 0625  WBC 15.3* 16.7*  HGB 11.8* 11.7*  HCT 34.8* 34.6*  PLT 521* 543*   Recent Labs    03/13/24 0344 03/14/24 0624  NA 133* 133*  K 4.0 4.3  CL 100 102  CO2 24 22  GLUCOSE 106* 112*  BUN 17 17  CREATININE 1.25* 1.14  CALCIUM 8.8* 8.9    Intake/Output Summary (Last 24 hours) at 03/15/2024 1815 Last data filed at 03/15/2024 1348 Gross per 24 hour  Intake 480 ml  Output 1600 ml  Net -1120 ml        Physical Exam: Vital Signs Blood pressure 108/60, pulse 73, temperature (!) 97.5 F (36.4 C), resp. rate 16, height 6\' 3"  (1.905 m), weight 97.5 kg, SpO2 93%.  General: No apparent distress HEENT: Head is normocephalic, atraumatic,  oral mucosa pink and moist, decreased dentition  Neck: Supple without JVD or lymphadenopathy Heart: Reg rate and rhythm. No murmurs rubs or gallops Chest: CTA bilaterally without wheezes, rales, or rhonchi; no distress Abdomen: Soft, non-tender, mildly-distended, bowel sounds positive. Extremities: No clubbing, cyanosis, or edema. Pulses are 2+ Psych: Pt's affect is appropriate. Pt is cooperative Skin: Sternal incision CDI, right leg graft site appears to be healing well   Neuro:     Mental Status: AAOx4, no apparent cognitive deficits, cranial nerves II through XII grossly intact   MOTOR: RUE: At least 4 out of 5 limited by sternal precautions  LUE: At least 4 out of 5 limited by sternal precautions RLE: HF 5/5, KE 5/5, ADF 5/5, APF  5/5 LLE: HF 5/5, KE 5/5, ADF 5/5, APF 5/5   SENSORY: Normal to touch all 4 extremities, altered on the left side of his surgical incision      Assessment/Plan: 1. Functional deficits which require 3+ hours per day of interdisciplinary therapy in a comprehensive inpatient rehab setting. Physiatrist is providing close team supervision and 24 hour management of active medical problems listed below. Physiatrist and rehab team continue to assess barriers to discharge/monitor patient progress toward functional and medical goals  Care Tool:  Bathing    Body parts bathed by patient: Right arm, Left arm, Chest, Abdomen, Front perineal area, Right upper leg, Left upper leg, Face   Body parts bathed by helper: Buttocks, Left lower leg, Right lower leg     Bathing assist Assist Level: Minimal Assistance - Patient > 75%     Upper Body Dressing/Undressing Upper body dressing   What is the patient wearing?: Pull over shirt    Upper body assist Assist Level: Supervision/Verbal cueing    Lower Body Dressing/Undressing Lower body dressing      What is the patient wearing?: Pants     Lower body assist Assist for lower body  dressing: Minimal Assistance - Patient > 75%     Toileting Toileting Toileting Activity did not occur Press photographer and hygiene only): N/A (no void or bm)  Toileting assist       Transfers Chair/bed transfer  Transfers assist     Chair/bed transfer assist level: Contact Guard/Touching assist     Locomotion Ambulation   Ambulation assist   Ambulation activity did not occur:  (dizziness, lightheadedness)  Assist level: Contact Guard/Touching assist Assistive device: No Device Max distance: 180   Walk 10 feet activity   Assist  Walk 10 feet activity did not occur:  (dizziness, lightheadedness)  Assist level: Contact Guard/Touching assist     Walk 50 feet activity   Assist Walk 50 feet with 2 turns activity did not occur:   (dizziness, lightheadedness)  Assist level: Contact Guard/Touching assist      Walk 150 feet activity   Assist Walk 150 feet activity did not occur:  (dizziness, lightheadedness)  Assist level: Contact Guard/Touching assist      Walk 10 feet on uneven surface  activity   Assist Walk 10 feet on uneven surfaces activity did not occur:  (dizziness, lightheadedness)   Assist level: Minimal Assistance - Patient > 75% Assistive device: Other (comment) (no AD)   Wheelchair     Assist Is the patient using a wheelchair?: Yes Type of Wheelchair: Manual Wheelchair activity did not occur: Safety/medical concerns (dizziness, lightheadedness)         Wheelchair 50 feet with 2 turns activity    Assist    Wheelchair 50 feet with 2 turns activity did not occur: Safety/medical concerns (dizziness, lightheadedness)       Wheelchair 150 feet activity     Assist  Wheelchair 150 feet activity did not occur: Safety/medical concerns (dizziness, lightheadedness)       Blood pressure 108/60, pulse 73, temperature (!) 97.5 F (36.4 C), resp. rate 16, height 6\' 3"  (1.905 m), weight 97.5 kg, SpO2 93%.  Medical Problem List and Plan: 1. 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  Antithrombotics: -DVT/anticoagulation:  Pharmaceutical: Eliquis             -antiplatelet therapy: ASA 3. Pain Management: Oxycodone prn for severe pain.              -4/2 history of substance abuse years ago 4. Mood/Behavior/Sleep: LCSW to follow for evaluation and support.              -antipsychotic agents: N/A 5. Neuropsych/cognition: This patient is capable of making decisions on his own behalf. 6. Skin/Wound Care: Monitor wound for healing.  7. Fluids/Electrolytes/Nutrition:  Monitor I/O. Check CMET in am 8. A fib: Monitor HR TID--continue  Amiodarone 400 mg bid             -4/4 heart rate overall stable, continue to monitor     03/15/2024    3:05 PM 03/15/2024   10:04 AM 03/15/2024    5:00 AM  Vitals with BMI  Weight   214 lbs 15 oz  BMI   26.87  Systolic 108    Diastolic 60    Pulse 73 62     9. Orthostatic hypotension: Encourage fluid  intake. Midodrine added on 04/02.  TED hose started  - 4/4 reports improved, denies recent symptoms 10. Urinary retention: On Flomax and reports voiding without difficulty.  voiding --Foley discontinued on 03/30. Educated on need for PVRs 1-2 days to monitor function.    --has had rise in SCr from 0.9-->1.26, recheck labs tomorrow -4/3 PVR 0 and 87, patient refusing bladder scans.  Appears overall stable will discontinue.  Creatinine improved to 1.14 -4/4 remains continent, continue to monitor 11. Acute blood loss anemia/Leukocytosis:   -Recheck tomorrow, WBC a little higher but has not been checked in a few days 12. Constipation: On dulcolax tabs daily--had BM on 03/31.              -4/2 consider additional medication tomorrow if no Bms  4/3 MiraLAX and sorbitol as needed for mild to moderate constipation  -4/4 ordered sorbitol due to constipation however nursing reports patient was able to have bowel movement without it 13.  Tobacco abuse: "Smoked his last cigarette on way to surgery"  --Continue nicotine patch.  -Tobacco cessation counseling 14. Impaired fasting glucose: Hgb A1C- 5.8. Continue CM diet.    -CBG stable  LOS: 2 days A FACE TO FACE EVALUATION WAS PERFORMED  Fanny Dance 03/15/2024, 6:15 PM

## 2024-03-16 DIAGNOSIS — I4891 Unspecified atrial fibrillation: Secondary | ICD-10-CM | POA: Diagnosis not present

## 2024-03-16 DIAGNOSIS — R5381 Other malaise: Secondary | ICD-10-CM | POA: Diagnosis not present

## 2024-03-16 DIAGNOSIS — R339 Retention of urine, unspecified: Secondary | ICD-10-CM | POA: Diagnosis not present

## 2024-03-16 DIAGNOSIS — D62 Acute posthemorrhagic anemia: Secondary | ICD-10-CM

## 2024-03-16 DIAGNOSIS — K5900 Constipation, unspecified: Secondary | ICD-10-CM | POA: Diagnosis not present

## 2024-03-16 MED ORDER — AMIODARONE HCL 200 MG PO TABS
200.0000 mg | ORAL_TABLET | Freq: Every day | ORAL | Status: DC
  Administered 2024-03-17 – 2024-03-21 (×5): 200 mg via ORAL
  Filled 2024-03-16 (×6): qty 1

## 2024-03-16 MED ORDER — METOPROLOL SUCCINATE ER 25 MG PO TB24
12.5000 mg | ORAL_TABLET | Freq: Every day | ORAL | Status: DC
  Administered 2024-03-17: 12.5 mg via ORAL
  Filled 2024-03-16 (×3): qty 1

## 2024-03-16 NOTE — Progress Notes (Signed)
 Notified provided of patient BP and HR this morning, due to current BP/HR this nurse requested to do Q4 VS to and per provided Hold Pacerone for today.   Tilden Dome, LPN

## 2024-03-16 NOTE — Progress Notes (Signed)
 Pt P 44 at hs. Pt refused metoprolol at bedtime due to pulse of 44 at this time and during some episodes at therapy yesterday morning. Pulse went up to 78 with nurse in room then back down to 44. This morning pt P 47, went up to 56 then back down to 46. Vitals taking with patient laying in bed. Denies dizziniess, feeling faint. Call light within reach.

## 2024-03-16 NOTE — Progress Notes (Signed)
 Occupational Therapy Session Note  Patient Details  Name: Dave Williams MRN: [id number] Date of Birth: 1956-10-10  Today's Date: 03/16/2024 OT Individual Time: 1400-1440 OT Individual Time Calculation (min): 40 min    Short Term Goals: Week 1:  OT Short Term Goal 1 (Week 1): Patient will transfer to toilet with close supervision following set up OT Short Term Goal 2 (Week 1): Patient will don socks with min assist OT Short Term Goal 3 (Week 1): STG=LTG due to short LOS  Skilled Therapeutic Interventions/Progress Updates:    Skilled OT intervention with focus on functional amb in community setting, ongoing discharge planning, safety awareness, and activity tolerance to increase independence with BADLs. All bed mobility, standing balance, and amb with RW at close supervision. No OH s/s noted. Amb with RW in courtyard at atrium entrance, navigating between seating, and up/down incline in pavement. Pt demo appropriate safety awareness in community environment. Pt returned to room and remained in bed with all needs within reach. Bed alarm activated.   Therapy Documentation Precautions:  Precautions Precautions: Fall, Sternal Precaution Booklet Issued: No Precaution/Restrictions Comments: watch orthostatics Restrictions Weight Bearing Restrictions Per Provider Order: No RUE Weight Bearing Per Provider Order: Non weight bearing LUE Weight Bearing Per Provider Order: Non weight bearing Other Position/Activity Restrictions: sternal precautions Pain:  Pt denies pain this afternoon    Therapy/Group: Individual Therapy  Rich Brave 03/16/2024, 2:50 PM

## 2024-03-16 NOTE — Progress Notes (Signed)
 Occupational Therapy Session Note  Patient Details  Name: Dave Williams MRN: [id number] Date of Birth: 12-25-1955  Today's Date: 03/16/2024 OT Individual Time: 0805-0900 OT Individual Time Calculation (min): 55 min    Short Term Goals: Week 1:  OT Short Term Goal 1 (Week 1): Patient will transfer to toilet with close supervision following set up OT Short Term Goal 2 (Week 1): Patient will don socks with min assist OT Short Term Goal 3 (Week 1): STG=LTG due to short LOS  Skilled Therapeutic Interventions/Progress Updates:    Patient in bed at the time of arrival, Patient in agreement with completing BADL related task in washing up at sink LOF. The pt presented with a BP of 99/60, 02 saturation of a 100%, and Pulse of 48   The pt reported a pain response of 4 on 0-10 for his shld's and sternal.region.  The pt went on to indicate that he rested well on last night. Patient able to come from supine in bed to EOB with SBA.  The pt was able to transfer from EOB to w/c LOF using a stand pivot transfer at [facility]. The pt was transported to the sink area and was able to doff his over head shirt using a modified technique with full  adherence to sternal precaution with SBA  The pt was able to bathe his UB and ULE with s/uA.  The pt was able to donn his over head shirt with s/uA and he was ModI with LB dressing incorporating the reacher. The pt was MaxA for his ted hose and non-skid socks.    The pt went on to complete UB exercise using a 4lb dumb bell 2 sets of 10 for bicep curls and horizontal abduction incorporating relaxation breathing to improve compliance. The pt was instructed in relaxation breathing and being aware of his internal environment to prevent falls secondary to dizziness.  The pt was transported back to bed LOF from the w/c using a stand pivot transfer at [facility] and SBA for transferring his legs onto the bed. At the end of the session, the call light and bedside table were placed within reach with  all additional needs addressed.   Therapy Documentation Precautions:  Precautions Precautions: Fall, Sternal Precaution Booklet Issued: No Precaution/Restrictions Comments: watch orthostatics Restrictions Weight Bearing Restrictions Per Provider Order: No RUE Weight Bearing Per Provider Order: Non weight bearing LUE Weight Bearing Per Provider Order: Non weight bearing Other Position/Activity Restrictions: sternal precautions   Therapy/Group: Individual Therapy  Lavona Mound 03/16/2024, 4:32 PM

## 2024-03-16 NOTE — Progress Notes (Signed)
 PROGRESS NOTE   Subjective/Complaints: Reports constipation is improved.  Patient had some bradycardia and soft BP this morning  ROS: Patient denies fever, new vision changes, dizziness, nausea, vomiting, diarrhea,  shortness of breath or chest pain, headache, or mood change. + Constipation    Objective:   No results found. Recent Labs    03/14/24 0624 03/15/24 0625  WBC 15.3* 16.7*  HGB 11.8* 11.7*  HCT 34.8* 34.6*  PLT 521* 543*   Recent Labs    03/14/24 0624  NA 133*  K 4.3  CL 102  CO2 22  GLUCOSE 112*  BUN 17  CREATININE 1.14  CALCIUM 8.9    Intake/Output Summary (Last 24 hours) at 03/16/2024 1504 Last data filed at 03/16/2024 1302 Gross per 24 hour  Intake 1216 ml  Output 1648 ml  Net -432 ml        Physical Exam: Vital Signs Blood pressure 113/70, pulse 77, temperature 98.6 F (37 C), resp. rate 16, height 6\' 3"  (1.905 m), weight 97.6 kg, SpO2 92%.  General: No apparent distress HEENT: Head is normocephalic, atraumatic,  oral mucosa pink and moist, decreased dentition  Neck: Supple without JVD or lymphadenopathy Heart: Reg rate and rhythm. No murmurs rubs or gallops Chest: CTA bilaterally without wheezes, rales, or rhonchi; no distress Abdomen: Soft, non-tender, mildly-distended, bowel sounds positive. Extremities: No clubbing, cyanosis, or edema. Pulses are 2+ Psych: Pt's affect is appropriate. Pt is cooperative Skin: Sternal incision CDI, right leg graft site appears to be healing well   Neuro:     Mental Status: AAOx4, no apparent cognitive deficits, cranial nerves II through XII grossly intact   MOTOR: RUE: At least 4 out of 5 limited by sternal precautions  LUE: At least 4 out of 5 limited by sternal precautions RLE: HF 5/5, KE 5/5, ADF 5/5, APF 5/5 LLE: HF 5/5, KE 5/5, ADF 5/5, APF 5/5   SENSORY: Normal to touch all 4 extremities, altered on the left side of his surgical  incision      Assessment/Plan: 1. Functional deficits which require 3+ hours per day of interdisciplinary therapy in a comprehensive inpatient rehab setting. Physiatrist is providing close team supervision and 24 hour management of active medical problems listed below. Physiatrist and rehab team continue to assess barriers to discharge/monitor patient progress toward functional and medical goals  Care Tool:  Bathing    Body parts bathed by patient: Right arm, Left arm, Chest, Abdomen, Front perineal area, Right upper leg, Left upper leg, Face   Body parts bathed by helper: Buttocks, Left lower leg, Right lower leg     Bathing assist Assist Level: Minimal Assistance - Patient > 75%     Upper Body Dressing/Undressing Upper body dressing   What is the patient wearing?: Pull over shirt    Upper body assist Assist Level: Supervision/Verbal cueing    Lower Body Dressing/Undressing Lower body dressing      What is the patient wearing?: Pants     Lower body assist Assist for lower body dressing: Minimal Assistance - Patient > 75%     Toileting Toileting Toileting Activity did not occur (Clothing management and hygiene only): N/A (no void  or bm)  Toileting assist Assist for toileting: Moderate Assistance - Patient 50 - 74%     Transfers Chair/bed transfer  Transfers assist     Chair/bed transfer assist level: Contact Guard/Touching assist     Locomotion Ambulation   Ambulation assist   Ambulation activity did not occur:  (dizziness, lightheadedness)  Assist level: Contact Guard/Touching assist Assistive device: No Device Max distance: 353ft   Walk 10 feet activity   Assist  Walk 10 feet activity did not occur:  (dizziness, lightheadedness)  Assist level: Contact Guard/Touching assist Assistive device: No Device   Walk 50 feet activity   Assist Walk 50 feet with 2 turns activity did not occur:  (dizziness, lightheadedness)  Assist level: Contact  Guard/Touching assist Assistive device: No Device    Walk 150 feet activity   Assist Walk 150 feet activity did not occur:  (dizziness, lightheadedness)  Assist level: Contact Guard/Touching assist Assistive device: No Device    Walk 10 feet on uneven surface  activity   Assist Walk 10 feet on uneven surfaces activity did not occur:  (dizziness, lightheadedness)   Assist level: Minimal Assistance - Patient > 75% Assistive device: Other (comment) (no AD)   Wheelchair     Assist Is the patient using a wheelchair?: Yes Type of Wheelchair: Manual Wheelchair activity did not occur: Safety/medical concerns (dizziness, lightheadedness)  Wheelchair assist level: Supervision/Verbal cueing Max wheelchair distance: 156ft    Wheelchair 50 feet with 2 turns activity    Assist    Wheelchair 50 feet with 2 turns activity did not occur: Safety/medical concerns (dizziness, lightheadedness)   Assist Level: Supervision/Verbal cueing   Wheelchair 150 feet activity     Assist  Wheelchair 150 feet activity did not occur: Safety/medical concerns (dizziness, lightheadedness)   Assist Level: Supervision/Verbal cueing   Blood pressure 113/70, pulse 77, temperature 98.6 F (37 C), resp. rate 16, height 6\' 3"  (1.905 m), weight 97.6 kg, SpO2 92%.  Medical Problem List and Plan: 1. 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  Antithrombotics: -DVT/anticoagulation:  Pharmaceutical: Eliquis             -antiplatelet therapy: ASA 3. Pain Management: Oxycodone prn for severe pain.              -4/2 history of substance abuse years ago 4. Mood/Behavior/Sleep: LCSW to follow for evaluation and support.              -antipsychotic agents: N/A 5. Neuropsych/cognition: This patient is capable of making decisions on his own behalf. 6.  Skin/Wound Care: Monitor wound for healing.  7. Fluids/Electrolytes/Nutrition:  Monitor I/O. Check CMET in am 8. A fib: Monitor HR TID--continue Amiodarone 400 mg bid             -4/5 heart rate a little low this morning but now in the 80s, decrease amiodarone to 200 mg daily as per cardiothoracic recommendations, decrease metoprolol dose     03/16/2024    1:24 PM 03/16/2024    9:03 AM 03/16/2024    9:02 AM  Vitals with BMI  Systolic 113 131   Diastolic 70 66   Pulse 77 49 48    9. Orthostatic hypotension:  Encourage fluid intake. Midodrine added on 04/02.  TED hose started  - 4/4 reports improved, denies recent symptoms  - 4/5 decrease amiodarone and metoprolol as above 10. Urinary retention: On Flomax and reports voiding without difficulty.  voiding --Foley discontinued on 03/30. Educated on need for PVRs 1-2 days to monitor function.    --has had rise in SCr from 0.9-->1.26, recheck labs tomorrow -4/3 PVR 0 and 87, patient refusing bladder scans.  Appears overall stable will discontinue.  Creatinine improved to 1.14 -4/5 remains continent PVR 98 11. Acute blood loss anemia/Leukocytosis:   - 4/5 WBC a little higher at 16.7, hemoglobin stable 11.7.  No signs of infection noted continue to monitor 12. Constipation: On dulcolax tabs daily--had BM on 03/31.              -4/2 consider additional medication tomorrow if no Bms  4/3 MiraLAX and sorbitol as needed for mild to moderate constipation  -4/4 ordered sorbitol due to constipation however nursing reports patient was able to have bowel movement without it  -4/5 constipation improved after bowel movements yesterday 13.  Tobacco abuse: "Smoked his last cigarette on way to surgery"  --Continue nicotine patch.  -Tobacco cessation counseling 14. Impaired fasting glucose: Hgb A1C- 5.8. Continue CM diet.    -CBG stable  LOS: 3 days A FACE TO FACE EVALUATION WAS PERFORMED  Fanny Dance 03/16/2024, 3:04 PM

## 2024-03-16 NOTE — Progress Notes (Signed)
 Occupational Therapy Session Note  Patient Details  Name: Jacobi Nile MRN: [id number] Date of Birth: 07-Jul-1956  Today's Date: 03/16/2024 OT Individual Time: 1115-1200 OT Individual Time Calculation (min): 45 min    Short Term Goals: Week 1:  OT Short Term Goal 1 (Week 1): Patient will transfer to toilet with close supervision following set up OT Short Term Goal 2 (Week 1): Patient will don socks with min assist OT Short Term Goal 3 (Week 1): STG=LTG due to short LOS  Skilled Therapeutic Interventions/Progress Updates:    Pt resting in bed upon arrival and ready for therapy. Skilled OT intervention with focus on ed mobility, sit<>stand, standing balance, functional amb with RW, and safety awareness to increase independence with BADLs. BP supine 104/59 HR 46.Supine<>sit EOB with supervision without use of bed funcitons. Sit<>stand with supervision. Pt amb with RW to day room for standing activities. Pt stood for cornhole X 2 on compliant and noncompliant surface. Min verbal cues for CABG precautions x1 with stand>sit. Pt standing on floor and AirEx (CGA when on AirEx.) Pt used reacher to retrieve bean bags from floor while standing with RW. No LOB noted. Pt returned to room and remained in bed with all needs within reach. Bed alarm activated. No s/s OH throughout session.  Therapy Documentation Precautions:  Precautions Precautions: Fall, Sternal Precaution Booklet Issued: No Precaution/Restrictions Comments: watch orthostatics Restrictions Weight Bearing Restrictions Per Provider Order: No RUE Weight Bearing Per Provider Order: Non weight bearing LUE Weight Bearing Per Provider Order: Non weight bearing Other Position/Activity Restrictions: sternal precautions Pain: Pt reports 2/10 generalized pain; no meds requested, activity, and repositininig  Therapy/Group: Individual Therapy  Rich Brave 03/16/2024, 12:15 PM

## 2024-03-16 NOTE — Progress Notes (Signed)
 Physical Therapy Session Note  Patient Details  Name: Dave Williams MRN: [id number] Date of Birth: 14-Jan-1956  Today's Date: 03/16/2024 PT Individual Time: 0900-1010 PT Individual Time Calculation (min): 70 min   Short Term Goals: Week 1:  PT Short Term Goal 1 (Week 1): STG=LTG due to LOS  Skilled Therapeutic Interventions/Progress Updates:   Received pt semi-reclined in bed - handoff from OT. Per pt and OT, HR low (46bpm) and remained low throughout session. Checked with RN and pt cleared for therapy as tolerated. Breakfast tray arrived and while pt ate, RN administered medication and therapist took vitals. Pt agreeable to PT treatment and reported minor pain in chest and shoulder blades (unrated and declined pain medication). Session with emphasis on functional mobility/transfers, generalized strengthening and endurance, dynamic standing balance/coordination, stair navigation, and gait training. Pt transferred semi-reclined<>sitting R EOB with HOB elevated and supervision with good adherence to sternal precautions. Pt reporting mild lightheadedness upon sitting up that improved with rest.   Pt performed all transfers with CGA throughout session. Pt transported to main therapy gym in [facility] dependently. Pt ambulated 359ft without AD and CGA - pt demonstrating improvements in stability and improving with reciprocal arm swing. Ambulated ~32ft without AD and CGA to staircase and navigated 12 6in steps with bilateral handrails with CGA fading to close supervision ascending and descending with a step through pattern. Pt then performed WC mobility 30ft x 1 and 166ft x 1 using BUE and supervision with emphasis on hamstring strength. Returned to bed and transferred into supine with supervision. Concluded session with pt semi-reclined in bed, needs within reach, and bed alarm on.   Vitals (thigh high teds on): Semi-reclined in bed - BP: 131/66, HR 48bpm, and SPO2 96% Sitting EOB - BP: 107/55, HR 50bpm, and  SPO2 97% increasing to 123/70 with HR 52bpm after sitting up for 5 minutes Standing EOB - BP: 101/52, HR 55bpm, and SPO2 99%  Sitting in main gym: 120/68 Sitting after ambulating: 124/80, HR 89bpm, and SPO2 97% Sitting after stair navigation: 124/71, HR 91bpm, and SPO2 98% Supine in bed: 135/74, HR 85bpm, SPO2 99%  Therapy Documentation Precautions:  Precautions Precautions: Fall, Sternal Precaution Booklet Issued: No Precaution/Restrictions Comments: watch orthostatics Restrictions Weight Bearing Restrictions Per Provider Order: No RUE Weight Bearing Per Provider Order: Non weight bearing LUE Weight Bearing Per Provider Order: Non weight bearing Other Position/Activity Restrictions: sternal precautions  Therapy/Group: Individual Therapy Marlana Salvage Zaunegger Blima Rich PT, DPT 03/16/2024, 7:02 AM

## 2024-03-16 NOTE — Plan of Care (Signed)
  Problem: Consults Goal: RH GENERAL PATIENT EDUCATION Description: See Patient Education module for education specifics. Outcome: Progressing   Problem: RH BOWEL ELIMINATION Goal: RH STG MANAGE BOWEL WITH ASSISTANCE Description: STG Manage Bowel with  Mod I Assistance. Outcome: Progressing   Problem: RH BLADDER ELIMINATION Goal: RH STG MANAGE BLADDER WITH ASSISTANCE Description: STG Manage Bladder With  Mod I Assistance Outcome: Progressing   Problem: RH SKIN INTEGRITY Goal: RH STG SKIN FREE OF INFECTION/BREAKDOWN Description: Manage skin free of infection/breakdown with mod I assistance Outcome: Progressing   Problem: RH SAFETY Goal: RH STG ADHERE TO SAFETY PRECAUTIONS W/ASSISTANCE/DEVICE Description:  Adhere to Safety Precautions With  mod I Assistance/Device. Outcome: Progressing   Problem: RH PAIN MANAGEMENT Goal: RH STG PAIN MANAGED AT OR BELOW PT'S PAIN GOAL Description: <4 w/ prns Outcome: Progressing   Problem: RH KNOWLEDGE DEFICIT GENERAL Goal: RH STG INCREASE KNOWLEDGE OF SELF CARE AFTER HOSPITALIZATION Description: Manage increase knowledge of self care after hospitalization deficit  with mod I assistance using educational materials provided Outcome: Progressing

## 2024-03-16 NOTE — IPOC Note (Signed)
 Overall Plan of Care [facility]) Patient Details Name: Dave Williams MRN: [id number] DOB: May 15, 1956  Admitting Diagnosis: Debility  Hospital Problems: Principal Problem:   Debility     Functional Problem List: Nursing Bladder, Bowel, Endurance, Medication Management, Pain, Skin Integrity  PT Balance, Edema, Endurance, Sensory, Skin Integrity  OT Balance, Endurance, Safety, Pain, Sensory  SLP    TR         Basic ADL's: OT Bathing, Dressing, Toileting     Advanced  ADL's: OT Simple Meal Preparation, Laundry     Transfers: PT Bed Mobility, Bed to Chair, Set designer, Occupational psychologist, Research scientist (life sciences): PT Ambulation, Psychologist, prison and probation services, Stairs     Additional Impairments: OT    SLP        TR      Anticipated Outcomes Item Anticipated Outcome  Self Feeding Independent  Swallowing      Basic self-care  Mod I  Toileting  Mod I   Bathroom Transfers Mod I  Bowel/Bladder  managebladder retention with medicatyions/ manage bowels with medications  Transfers  Mod I with LRAD  Locomotion  Mod I with LRAD  Communication     Cognition     Pain  <4 w/ prns  Safety/Judgment  manage safety with mod I assistance   Therapy Plan: PT Intensity: Minimum of 1-2 x/day ,45 to 90 minutes PT Frequency: 5 out of 7 days PT Duration Estimated Length of Stay: 7-10 days OT Intensity: Minimum of 1-2 x/day, 45 to 90 minutes OT Frequency: 5 out of 7 days OT Duration/Estimated Length of Stay: 7-9 days     Team Interventions: Nursing Interventions Patient/Family Education, Medication Management, Bladder Management, Bowel Management, Disease Management/Prevention, Pain Management, Discharge Planning  PT interventions Ambulation/gait training, Discharge planning, Functional mobility training, Psychosocial support, Therapeutic Activities, Balance/vestibular training, Disease management/prevention, Neuromuscular re-education, Skin care/wound management, Therapeutic Exercise,  Wheelchair propulsion/positioning, DME/adaptive equipment instruction, Pain management, Splinting/orthotics, UE/LE Strength taining/ROM, Firefighter, Equities trader education, Museum/gallery curator, UE/LE Coordination activities  OT Interventions Warden/ranger, Disease mangement/prevention, Patient/family education, Self Care/advanced ADL retraining, Therapeutic Exercise, Discharge planning, DME/adaptive equipment instruction, Functional mobility training, Pain management, Skin care/wound managment, Therapeutic Activities, UE/LE Strength taining/ROM  SLP Interventions    TR Interventions    SW/CM Interventions Discharge Planning, Psychosocial Support, Patient/Family Education   Barriers to Discharge MD  Medical stability  Nursing Decreased caregiver support, Home environment access/layout Discharge: House  Discharge Home Layout: One level  Discharge Home Access: Ramped entrance  PT Decreased caregiver support, Wound Care, Weight bearing restrictions, Other (comments) sternal precautions, decreased balance/coordination, global weakness/deconditioning  OT Decreased caregiver support Lives alone - has intermittent support from neighbors/ friends  SLP      SW Decreased caregiver support, Lack of/limited family support, Community education officer for SNF coverage     Team Discharge Planning: Destination: PT-Home ,OT- Home , SLP-  Projected Follow-up: PT-Outpatient PT, OT-  Home health OT, SLP-  Projected Equipment Needs: PT-To be determined, OT- 3 in 1 bedside comode, To be determined, SLP-  Equipment Details: PT-has none, OT-Has a shower seat with back he bought for ex-girlfriend.  Hasn't been used - has tub/shower.  May be able to use or may need to purchase a tub trasnfer bench. 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 The team will be addressing functional  mobility, strength, stamina, balance, safety, adaptive techniques and equipment, self-care, bowel and bladder mgt, patient and caregiver education. Goals have been set at 7-10. Anticipated discharge destination is mod I.        See Team Conference Notes for weekly updates to the plan of care

## 2024-03-17 DIAGNOSIS — K5900 Constipation, unspecified: Secondary | ICD-10-CM | POA: Diagnosis not present

## 2024-03-17 DIAGNOSIS — R339 Retention of urine, unspecified: Secondary | ICD-10-CM | POA: Diagnosis not present

## 2024-03-17 DIAGNOSIS — I4891 Unspecified atrial fibrillation: Secondary | ICD-10-CM | POA: Diagnosis not present

## 2024-03-17 DIAGNOSIS — R5381 Other malaise: Secondary | ICD-10-CM | POA: Diagnosis not present

## 2024-03-17 NOTE — Progress Notes (Signed)
 PROGRESS NOTE   Subjective/Complaints: No new concerns or complaints this morning.  Denies any dizziness.  Pain is controlled.  He says that for the last 2 days he feels like himself again.  ROS: Patient denies fever, new vision changes, dizziness, nausea, vomiting, diarrhea,  shortness of breath or chest pain, headache, or mood change. + Constipation-improved Chest wall soreness improving   Objective:   No results found. Recent Labs    03/15/24 0625  WBC 16.7*  HGB 11.7*  HCT 34.6*  PLT 543*   No results for input(s): "NA", "K", "CL", "CO2", "GLUCOSE", "BUN", "CREATININE", "CALCIUM" in the last 72 hours.   Intake/Output Summary (Last 24 hours) at 03/17/2024 1510 Last data filed at 03/17/2024 1459 Gross per 24 hour  Intake 1174 ml  Output 2372 ml  Net -1198 ml        Physical Exam: Vital Signs Blood pressure 117/66, pulse 76, temperature 98.3 F (36.8 C), temperature source Oral, resp. rate 18, height 6\' 3"  (1.905 m), weight 97.4 kg, SpO2 95%.  General: No apparent distress, appears comfortable laying in bed HEENT: Head is normocephalic, atraumatic,  oral mucosa pink and moist, decreased dentition  Neck: Supple without JVD or lymphadenopathy Heart: Reg rate and rhythm. No murmurs rubs or gallops Chest: CTA bilaterally without wheezes, rales, or rhonchi; no distress Abdomen: Soft, non-tender, mildly-distended, bowel sounds normoactive Extremities: No clubbing, cyanosis, or edema. Pulses are 2+ Psych: Pt's affect is appropriate. Pt is cooperative Skin: Sternal incision CDI, right leg graft site appears to be healing well   Neuro:     Mental Status: AAOx4, no apparent cognitive deficits, cranial nerves II through XII grossly intact   MOTOR: RUE: At least 4 out of 5 limited by sternal precautions  LUE: At least 4 out of 5 limited by sternal precautions RLE: HF 5/5, KE 5/5, ADF 5/5, APF 5/5 LLE: HF 5/5, KE 5/5,  ADF 5/5, APF 5/5   SENSORY: Normal to touch all 4 extremities, altered on the left side of his surgical incision      Assessment/Plan: 1. Functional deficits which require 3+ hours per day of interdisciplinary therapy in a comprehensive inpatient rehab setting. Physiatrist is providing close team supervision and 24 hour management of active medical problems listed below. Physiatrist and rehab team continue to assess barriers to discharge/monitor patient progress toward functional and medical goals  Care Tool:  Bathing    Body parts bathed by patient: Right arm, Left arm, Chest, Abdomen, Front perineal area, Right upper leg, Left upper leg, Face   Body parts bathed by helper: Buttocks, Left lower leg, Right lower leg     Bathing assist Assist Level: Minimal Assistance - Patient > 75%     Upper Body Dressing/Undressing Upper body dressing   What is the patient wearing?: Pull over shirt    Upper body assist Assist Level: Supervision/Verbal cueing    Lower Body Dressing/Undressing Lower body dressing      What is the patient wearing?: Pants     Lower body assist Assist for lower body dressing: Minimal Assistance - Patient > 75%     Toileting Toileting Toileting Activity did not occur Press photographer and  hygiene only): N/A (no void or bm)  Toileting assist Assist for toileting: Moderate Assistance - Patient 50 - 74%     Transfers Chair/bed transfer  Transfers assist     Chair/bed transfer assist level: Contact Guard/Touching assist     Locomotion Ambulation   Ambulation assist   Ambulation activity did not occur:  (dizziness, lightheadedness)  Assist level: Contact Guard/Touching assist Assistive device: No Device Max distance: 327ft   Walk 10 feet activity   Assist  Walk 10 feet activity did not occur:  (dizziness, lightheadedness)  Assist level: Contact Guard/Touching assist Assistive device: No Device   Walk 50 feet activity   Assist  Walk 50 feet with 2 turns activity did not occur:  (dizziness, lightheadedness)  Assist level: Contact Guard/Touching assist Assistive device: No Device    Walk 150 feet activity   Assist Walk 150 feet activity did not occur:  (dizziness, lightheadedness)  Assist level: Contact Guard/Touching assist Assistive device: No Device    Walk 10 feet on uneven surface  activity   Assist Walk 10 feet on uneven surfaces activity did not occur:  (dizziness, lightheadedness)   Assist level: Minimal Assistance - Patient > 75% Assistive device: Other (comment) (no AD)   Wheelchair     Assist Is the patient using a wheelchair?: Yes Type of Wheelchair: Manual Wheelchair activity did not occur: Safety/medical concerns (dizziness, lightheadedness)  Wheelchair assist level: Supervision/Verbal cueing Max wheelchair distance: 118ft    Wheelchair 50 feet with 2 turns activity    Assist    Wheelchair 50 feet with 2 turns activity did not occur: Safety/medical concerns (dizziness, lightheadedness)   Assist Level: Supervision/Verbal cueing   Wheelchair 150 feet activity     Assist  Wheelchair 150 feet activity did not occur: Safety/medical concerns (dizziness, lightheadedness)   Assist Level: Supervision/Verbal cueing   Blood pressure 117/66, pulse 76, temperature 98.3 F (36.8 C), temperature source Oral, resp. rate 18, height 6\' 3"  (1.905 m), weight 97.4 kg, SpO2 95%.  Medical Problem List and Plan: 1. 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  Antithrombotics: -DVT/anticoagulation:  Pharmaceutical: Eliquis             -antiplatelet therapy: ASA 3. Pain Management: Oxycodone prn for severe pain.              -4/2 history of substance abuse years ago 4. Mood/Behavior/Sleep: LCSW to follow for evaluation and support.               -antipsychotic agents: N/A 5. Neuropsych/cognition: This patient is capable of making decisions on his own behalf. 6. Skin/Wound Care: Monitor wound for healing.  7. Fluids/Electrolytes/Nutrition:  Monitor I/O. Check CMET in am 8. A fib: Monitor HR TID--continue Amiodarone 400 mg bid             -4/5 heart rate a little low this morning but now in the 80s, decrease amiodarone to 200 mg daily as per cardiothoracic recommendations, decrease metoprolol dose  -4/6 had a couple episodes of bradycardia but overall doing better on lower dose of amiodarone and metoprolol.  Currently heart rate in the 70s and BP stable     03/17/2024    1:30 PM  03/17/2024    8:00 AM 03/17/2024    7:41 AM  Vitals with BMI  Systolic 117  117  Diastolic 66  53  Pulse 76 72 52    9. Orthostatic hypotension: Encourage fluid intake. Midodrine added on 04/02.  TED hose started  - 4/4 reports improved, denies recent symptoms  - 4/5 decrease amiodarone and metoprolol as above  - 4/6 patient denies any further symptoms of orthostatic hypotension 10. Urinary retention: On Flomax and reports voiding without difficulty.  voiding --Foley discontinued on 03/30. Educated on need for PVRs 1-2 days to monitor function.    --has had rise in SCr from 0.9-->1.26, recheck labs tomorrow -4/3 PVR 0 and 87, patient refusing bladder scans.  Appears overall stable will discontinue.  Creatinine improved to 1.14 -4/5 remains continent PVR 98 and 72 11. Acute blood loss anemia/Leukocytosis:   - 4/5 WBC a little higher at 16.7, hemoglobin stable 11.7.  No signs of infection noted continue to monitor 12. Constipation: On dulcolax tabs daily--had BM on 03/31.              -4/2 consider additional medication tomorrow if no Bms  4/3 MiraLAX and sorbitol as needed for mild to moderate constipation  -4/4 ordered sorbitol due to constipation however nursing reports patient was able to have bowel movement without it  -4/5 constipation improved  after bowel movements yesterday  -4/6 consider additional med if no bm today 13.  Tobacco abuse: "Smoked his last cigarette on way to surgery"  --Continue nicotine patch.  -Tobacco cessation counseling 14. Impaired fasting glucose: Hgb A1C- 5.8. Continue CM diet.    -CBG stable  LOS: 4 days A FACE TO FACE EVALUATION WAS PERFORMED  Fanny Dance 03/17/2024, 3:10 PM

## 2024-03-17 NOTE — Plan of Care (Signed)
  Problem: Consults Goal: RH GENERAL PATIENT EDUCATION Description: See Patient Education module for education specifics. Outcome: Progressing   Problem: RH BOWEL ELIMINATION Goal: RH STG MANAGE BOWEL WITH ASSISTANCE Description: STG Manage Bowel with  Mod I Assistance. Outcome: Progressing   Problem: RH BLADDER ELIMINATION Goal: RH STG MANAGE BLADDER WITH ASSISTANCE Description: STG Manage Bladder With  Mod I Assistance Outcome: Progressing   Problem: RH SKIN INTEGRITY Goal: RH STG SKIN FREE OF INFECTION/BREAKDOWN Description: Manage skin free of infection/breakdown with mod I assistance Outcome: Progressing   Problem: RH SAFETY Goal: RH STG ADHERE TO SAFETY PRECAUTIONS W/ASSISTANCE/DEVICE Description:  Adhere to Safety Precautions With  mod I Assistance/Device. Outcome: Progressing   Problem: RH PAIN MANAGEMENT Goal: RH STG PAIN MANAGED AT OR BELOW PT'S PAIN GOAL Description: <4 w/ prns Outcome: Progressing

## 2024-03-17 NOTE — Plan of Care (Signed)
  Problem: Consults Goal: RH GENERAL PATIENT EDUCATION Description: See Patient Education module for education specifics. Outcome: Progressing   Problem: RH BOWEL ELIMINATION Goal: RH STG MANAGE BOWEL WITH ASSISTANCE Description: STG Manage Bowel with  Mod I Assistance. Outcome: Progressing   Problem: RH BLADDER ELIMINATION Goal: RH STG MANAGE BLADDER WITH ASSISTANCE Description: STG Manage Bladder With  Mod I Assistance Outcome: Progressing   Problem: RH SKIN INTEGRITY Goal: RH STG SKIN FREE OF INFECTION/BREAKDOWN Description: Manage skin free of infection/breakdown with mod I assistance Outcome: Progressing   Problem: RH SAFETY Goal: RH STG ADHERE TO SAFETY PRECAUTIONS W/ASSISTANCE/DEVICE Description:  Adhere to Safety Precautions With  mod I Assistance/Device. Outcome: Progressing

## 2024-03-18 DIAGNOSIS — R5381 Other malaise: Secondary | ICD-10-CM | POA: Diagnosis not present

## 2024-03-18 LAB — CBC WITH DIFFERENTIAL/PLATELET
Abs Immature Granulocytes: 0.16 10*3/uL — ABNORMAL HIGH (ref 0.00–0.07)
Basophils Absolute: 0.1 10*3/uL (ref 0.0–0.1)
Basophils Relative: 1 %
Eosinophils Absolute: 0.3 10*3/uL (ref 0.0–0.5)
Eosinophils Relative: 3 %
HCT: 34.3 % — ABNORMAL LOW (ref 39.0–52.0)
Hemoglobin: 11.5 g/dL — ABNORMAL LOW (ref 13.0–17.0)
Immature Granulocytes: 1 %
Lymphocytes Relative: 24 %
Lymphs Abs: 3.2 10*3/uL (ref 0.7–4.0)
MCH: 30.7 pg (ref 26.0–34.0)
MCHC: 33.5 g/dL (ref 30.0–36.0)
MCV: 91.5 fL (ref 80.0–100.0)
Monocytes Absolute: 1.3 10*3/uL — ABNORMAL HIGH (ref 0.1–1.0)
Monocytes Relative: 10 %
Neutro Abs: 8 10*3/uL — ABNORMAL HIGH (ref 1.7–7.7)
Neutrophils Relative %: 61 %
Platelets: 600 10*3/uL — ABNORMAL HIGH (ref 150–400)
RBC: 3.75 MIL/uL — ABNORMAL LOW (ref 4.22–5.81)
RDW: 13 % (ref 11.5–15.5)
WBC: 13.2 10*3/uL — ABNORMAL HIGH (ref 4.0–10.5)
nRBC: 0 % (ref 0.0–0.2)

## 2024-03-18 LAB — BASIC METABOLIC PANEL WITH GFR
Anion gap: 8 (ref 5–15)
BUN: 13 mg/dL (ref 8–23)
CO2: 23 mmol/L (ref 22–32)
Calcium: 9 mg/dL (ref 8.9–10.3)
Chloride: 104 mmol/L (ref 98–111)
Creatinine, Ser: 1.25 mg/dL — ABNORMAL HIGH (ref 0.61–1.24)
GFR, Estimated: >60 mL/min (ref >60)
Glucose, Bld: 111 mg/dL — ABNORMAL HIGH (ref 70–99)
Potassium: 4.3 mmol/L (ref 3.5–5.1)
Sodium: 135 mmol/L (ref 135–145)

## 2024-03-18 MED ORDER — MAGNESIUM HYDROXIDE 400 MG/5ML PO SUSP: 15.0000 mL | Freq: Once | ORAL | Status: DC

## 2024-03-18 MED ORDER — MAGNESIUM HYDROXIDE 400 MG/5ML PO SUSP
15.0000 mL | Freq: Once | ORAL | Status: AC
  Administered 2024-03-18: 15 mL via ORAL
  Filled 2024-03-18: qty 30

## 2024-03-18 NOTE — Progress Notes (Signed)
 Patient ID: Dave Williams, male   DOB: Apr 16, 1956, 68 y.o.   MRN: [id number] Met with the patient to review current situation, rehab process, team conference and plan of care. Reviewed secondary risks including smoking cessation with HH diet. Patient noted patch is effective addressing smoking sensation.  Having trouble with orthostasis, MD adjusting medications and dc'd Flomax.  Encouraged po fluids. Reviewed dietary modifications and medications for discharge. Continue to follow along to address educational needs to facilitate preparation for discharge. Pamelia Hoit

## 2024-03-18 NOTE — Progress Notes (Signed)
 Physical Therapy Session Note  Patient Details  Name: Dave Williams MRN: [id number] Date of Birth: 1956/09/17  Today's Date: 03/18/2024 PT Individual Time: 4132-4401 and 1415-1446 PT Individual Time Calculation (min): 71 min and 31 min  Short Term Goals: Week 1:  PT Short Term Goal 1 (Week 1): STG=LTG due to LOS  Skilled Therapeutic Interventions/Progress Updates:   Treatment Session 1 Received pt semi-reclined in bed, pt agreeable to PT treatment, and denied any pain during session. Session with emphasis on functional mobility/transfers, generalized strengthening and endurance, dynamic standing balance/coordination, BP management, and gait training.  Pt transferred semi-reclined<>sitting R EOB with HOB slightly elevated and supervision/mod I.   Pt performed all transfers without AD and CGA and with rollator and supervision throughout session. Pt performed WC mobility 173ft using BLE and supervision to main therapy gym with emphasis on BLE strength. Discussed rollator vs SPC for DME at home with pt preferring rollator. Located rollator (unable to find standard rollator, only bariatric). Pt already familiar with brake safety.   Pt ambulated 243ft x 1 and 242ft x 1 with rollator and supervision - limited by increased dizziness. Took seated rest break on rollator to assess vitals. Tried no teds, then knee high teds, but ultimately needed thigh high teds. Transported back to room, ambulated back to bed, and transitioned into supine. Removed knee high teds and donned thigh high teds dependently. Pt performed the following exercises with emphasis on LE strength/ROM: -SLR 2x12 bilaterally -hip adduction pillow squeezes 2x15 with 5 second isometric hold -hip abduction 2x10 bilaterally Concluded session with pt semi-reclined in bed, needs within reach, and bed alarm on.   Vitals: Sitting EOB: BP 103/68, HR 74bpm, SPO2 97% (no teds) Standing EOB: 97/61 (no teds) Sitting in WC after WC mobility (no  teds): 115/68, HR 119bpm, SPO2 98% Sitting after ambulating trial 1 (no teds): BP 87/68, HR 114bpm, and SPO2 98% Sitting after donning knee high teds: BP: 106/55 Sitting after ambulating trial 2 with knee high teds: BP 97/76 Semi-reclined in bed with thigh high teds: 113/71, HR 71bpm, SPO2 98%  Treatment Session 2 Received pt semi-reclined in bed, pt agreeable to PT treatment and reported soreness in chest. Session with emphasis on functional mobility/transfers, generalized strengthening and endurance, and gait training. Pt transferred supine<>sitting R EOB from flat bed without bedrails and supervision with good adherence to sternal precautions.  Pt performed all transfers with rollator and supervision throughout session. Pt ambulated in/out of bathroom with rollator and close supervision. Pt able to stand and void with distant supervision. RN called to bedside to manually check BP - 102/62. Pt then ambulated 548ft with rollator and supervision. Returned to room and transferred into supine with supervision. Concluded session with pt semi-reclined in bed, needs within reach, and bed alarm on.   Vitals (thigh high teds on and pt asymptomatic): Semi-reclined in bed: 115/64 Sitting EOB: 91/54 Standing EOB:86/61 Standing after ambulating from bathroom: 84/66  Therapy Documentation Precautions:  Precautions Precautions: Fall, Sternal Precaution Booklet Issued: No Precaution/Restrictions Comments: watch orthostatics Restrictions Weight Bearing Restrictions Per Provider Order: No RUE Weight Bearing Per Provider Order: Non weight bearing LUE Weight Bearing Per Provider Order: Non weight bearing Other Position/Activity Restrictions: sternal precautions  Therapy/Group: Individual Therapy Marlana Salvage Zaunegger Blima Rich PT, DPT 03/18/2024, 6:51 AM

## 2024-03-18 NOTE — Progress Notes (Signed)
 Occupational Therapy Session Note  Patient Details  Name: Dave Williams MRN: [id number] Date of Birth: 1956/11/03  Today's Date: 03/18/2024 OT Individual Time: 1116-1200 OT Individual Time Calculation (min): 44 min    Short Term Goals: Week 1:  OT Short Term Goal 1 (Week 1): Patient will transfer to toilet with close supervision following set up OT Short Term Goal 2 (Week 1): Patient will don socks with min assist OT Short Term Goal 3 (Week 1): STG=LTG due to short LOS  Skilled Therapeutic Interventions/Progress Updates:  Pt greeted seated in recliner, pt agreeable to OT intervention.      Transfers/bed mobility/functional mobility: pt completed functional ambulation with rollator and CGA.   Therapeutic activity:  Pt completed circuit workout with an emphasis on improving functional endurance:  Sit>stands for 1 min with no UB support 30 sec rest Sit>stands on airex for 1 min  30 sec rest Sit>stands for 1 min while pushing 2lb weighted ball OH 30 sec rest break Standing Oh presses with 2 lb weighted ball  Pt reports not feeling well after this task:  BP- 144/127 ( 133)  HR 146-114 SpO2 97%   Pt completed functional ambulation task in gym with pt instructed to collect washcloths around gym from various surface heights to simulate IADL task of collecting items around house. Pt completed task with supervision and no LOB, education provided on using reacher as needed. Pt reports he plans to get one for home.   Pt completed Standing tolerance task with pt able to stand for 2 mins to fold wash cloths and simulate iADL task.   Ended session with pt supine in bed with all needs within reach and bed alarm activated.                    Therapy Documentation Precautions:  Precautions Precautions: Fall, Sternal Precaution Booklet Issued: No Precaution/Restrictions Comments: watch orthostatics Restrictions Weight Bearing Restrictions Per Provider Order: No RUE Weight Bearing Per  Provider Order: Non weight bearing LUE Weight Bearing Per Provider Order: Non weight bearing Other Position/Activity Restrictions: sternal precautions  Pain: no pain reported during session     Therapy/Group: Individual Therapy  Barron Schmid 03/18/2024, 12:10 PM

## 2024-03-18 NOTE — Progress Notes (Signed)
 Occupational Therapy Session Note  Patient Details  Name: Dave Williams MRN: [id number] Date of Birth: 03/15/1956  Today's Date: 03/18/2024 OT Individual Time: 1005-1100 OT Individual Time Calculation (min): 55 min    Short Term Goals: Week 1:  OT Short Term Goal 1 (Week 1): Patient will transfer to toilet with close supervision following set up OT Short Term Goal 2 (Week 1): Patient will don socks with min assist OT Short Term Goal 3 (Week 1): STG=LTG due to short LOS  Skilled Therapeutic Interventions/Progress Updates:  Skilled OT intervention completed with focus on BP management, activity tolerance, functional mobility. Pt received supine in bed, agreeable to session. 5/10 pain reported in sternum when coughing; pre-medicated. OT offered rest breaks and repositioning throughout for pain reduction.  Pt reported his BP and lightheadedness has been all over the place and felt low energy. Pt agreeable to check BP status with mobility. See below for vitals. Pt without symptoms all of session however did need rest breaks between transitions.  Transitioned to EOB with mod I, good recall of sternal precautions. Supervision sit > stand and short ambulatory transfer without AD to recliner. Then stood and ambulated with rollator to gym about 150 ft.   Pt participated in the following dynamic standing balance and endurance tasks to promote independence and safety during BADLs and functional mobility: -alternating toe taps to colored/numbered dots as called out by therapist to incorporate dual tasking component. Supervision sit > stand without AD, and close supervision needed for balance; no difficulty or LOB therefore upgraded task to having pt tap numbered dots with BLE while multi tasking transfer of cone from top of long mirror using BUE for greater balance challenge with tandem stance holds. Required CGA for task however no formal LOB. Required intermittent seated rest break for  fatigue  Ambulated back to room with rollator, then remained seated in recliner with direct care handoff to nursing coordinator at end of session.  Vitals BP 114/63 (supine with thigh TEDS) BP 92/59 (sitting EOB), HR 72 bpm BP 91/66 (sitting for ~5 mins), HR 67 bpm BP 98/67 (after ambulating to gym), HR 67 bpm BP 120/79 (after activity and ambulating back to room), HR 72 bpm   Therapy Documentation Precautions:  Precautions Precautions: Fall, Sternal Precaution Booklet Issued: No Precaution/Restrictions Comments: watch orthostatics Restrictions Weight Bearing Restrictions Per Provider Order: No Other Position/Activity Restrictions: sternal precautions    Therapy/Group: Individual Therapy  Melvyn Novas, MS, OTR/L  03/18/2024, 12:13 PM

## 2024-03-18 NOTE — Plan of Care (Signed)
  Problem: RH BOWEL ELIMINATION Goal: RH STG MANAGE BOWEL WITH ASSISTANCE Description: STG Manage Bowel with  Mod I Assistance. Outcome: Progressing   Problem: RH BLADDER ELIMINATION Goal: RH STG MANAGE BLADDER WITH ASSISTANCE Description: STG Manage Bladder With  Mod I Assistance Outcome: Progressing   Problem: RH SKIN INTEGRITY Goal: RH STG SKIN FREE OF INFECTION/BREAKDOWN Description: Manage skin free of infection/breakdown with mod I assistance Outcome: Progressing   Problem: RH PAIN MANAGEMENT Goal: RH STG PAIN MANAGED AT OR BELOW PT'S PAIN GOAL Description: <4 w/ prns Outcome: Progressing   Problem: RH KNOWLEDGE DEFICIT GENERAL Goal: RH STG INCREASE KNOWLEDGE OF SELF CARE AFTER HOSPITALIZATION Description: Manage increase knowledge of self care after hospitalization deficit  with mod I assistance using educational materials provided Outcome: Progressing

## 2024-03-18 NOTE — Plan of Care (Signed)
  Problem: RH BOWEL ELIMINATION Goal: RH STG MANAGE BOWEL WITH ASSISTANCE Description: STG Manage Bowel with  Mod I Assistance. Outcome: Progressing   Problem: RH BLADDER ELIMINATION Goal: RH STG MANAGE BLADDER WITH ASSISTANCE Description: STG Manage Bladder With  Mod I Assistance Outcome: Progressing   Problem: RH SKIN INTEGRITY Goal: RH STG SKIN FREE OF INFECTION/BREAKDOWN Description: Manage skin free of infection/breakdown with mod I assistance Outcome: Progressing   Problem: RH SAFETY Goal: RH STG ADHERE TO SAFETY PRECAUTIONS W/ASSISTANCE/DEVICE Description:  Adhere to Safety Precautions With  mod I Assistance/Device. Outcome: Progressing   Problem: RH PAIN MANAGEMENT Goal: RH STG PAIN MANAGED AT OR BELOW PT'S PAIN GOAL Description: <4 w/ prns Outcome: Progressing   Problem: RH KNOWLEDGE DEFICIT GENERAL Goal: RH STG INCREASE KNOWLEDGE OF SELF CARE AFTER HOSPITALIZATION Description: Manage increase knowledge of self care after hospitalization deficit  with mod I assistance using educational materials provided Outcome: Progressing

## 2024-03-18 NOTE — Progress Notes (Addendum)
 PROGRESS NOTE   Subjective/Complaints: Orthostasis has been biggest barrier, ok with d/cing flomax. Discussed with team that hopefully with this change BP will be much better tomorrow  ROS: Patient denies fever, new vision changes, dizziness, nausea, vomiting, diarrhea,  shortness of breath or chest pain, headache, or mood change. + Constipation-improved Chest wall soreness improving +orthostasis   Objective:   No results found. Recent Labs    03/18/24 0541  WBC 13.2*  HGB 11.5*  HCT 34.3*  PLT 600*   Recent Labs    03/18/24 0541  NA 135  K 4.3  CL 104  CO2 23  GLUCOSE 111*  BUN 13  CREATININE 1.25*  CALCIUM 9.0     Intake/Output Summary (Last 24 hours) at 03/18/2024 1026 Last data filed at 03/18/2024 0900 Gross per 24 hour  Intake 574 ml  Output 2025 ml  Net -1451 ml        Physical Exam: Vital Signs Blood pressure 113/71, pulse 65, temperature 97.9 F (36.6 C), resp. rate 18, height 6\' 3"  (1.905 m), weight 96.9 kg, SpO2 93%.  General: No apparent distress, appears comfortable laying in bed HEENT: Head is normocephalic, atraumatic,  oral mucosa pink and moist, decreased dentition  Neck: Supple without JVD or lymphadenopathy Heart: Reg rate and rhythm. No murmurs rubs or gallops Chest: CTA bilaterally without wheezes, rales, or rhonchi; no distress Abdomen: Soft, non-tender, mildly-distended, bowel sounds normoactive Extremities: No clubbing, cyanosis, or edema. Pulses are 2+ Psych: Pt's affect is appropriate. Pt is cooperative Skin: Sternal incision CDI, right leg graft site appears to be healing well   Neuro:     Mental Status: AAOx4, no apparent cognitive deficits, cranial nerves II through XII grossly intact   MOTOR: RUE: At least 4 out of 5 limited by sternal precautions  LUE: At least 4 out of 5 limited by sternal precautions RLE: HF 5/5, KE 5/5, ADF 5/5, APF 5/5 LLE: HF 5/5, KE 5/5, ADF  5/5, APF 5/5   SENSORY: Normal to touch all 4 extremities, altered on the left side of his surgical incision, stable 4/7      Assessment/Plan: 1. Functional deficits which require 3+ hours per day of interdisciplinary therapy in a comprehensive inpatient rehab setting. Physiatrist is providing close team supervision and 24 hour management of active medical problems listed below. Physiatrist and rehab team continue to assess barriers to discharge/monitor patient progress toward functional and medical goals  Care Tool:  Bathing    Body parts bathed by patient: Right arm, Left arm, Chest, Abdomen, Front perineal area, Right upper leg, Left upper leg, Face   Body parts bathed by helper: Buttocks, Left lower leg, Right lower leg     Bathing assist Assist Level: Minimal Assistance - Patient > 75%     Upper Body Dressing/Undressing Upper body dressing   What is the patient wearing?: Pull over shirt    Upper body assist Assist Level: Supervision/Verbal cueing    Lower Body Dressing/Undressing Lower body dressing      What is the patient wearing?: Pants     Lower body assist Assist for lower body dressing: Minimal Assistance - Patient > 75%  Toileting Toileting Toileting Activity did not occur Press photographer and hygiene only): N/A (no void or bm)  Toileting assist Assist for toileting: Moderate Assistance - Patient 50 - 74%     Transfers Chair/bed transfer  Transfers assist     Chair/bed transfer assist level: Supervision/Verbal cueing     Locomotion Ambulation   Ambulation assist   Ambulation activity did not occur:  (dizziness, lightheadedness)  Assist level: Supervision/Verbal cueing Assistive device: Rollator Max distance: 366ft   Walk 10 feet activity   Assist  Walk 10 feet activity did not occur:  (dizziness, lightheadedness)  Assist level: Supervision/Verbal cueing Assistive device: Rollator   Walk 50 feet activity   Assist Walk  50 feet with 2 turns activity did not occur:  (dizziness, lightheadedness)  Assist level: Supervision/Verbal cueing Assistive device: Rollator    Walk 150 feet activity   Assist Walk 150 feet activity did not occur:  (dizziness, lightheadedness)  Assist level: Supervision/Verbal cueing Assistive device: Rollator    Walk 10 feet on uneven surface  activity   Assist Walk 10 feet on uneven surfaces activity did not occur:  (dizziness, lightheadedness)   Assist level: Minimal Assistance - Patient > 75% Assistive device: Other (comment) (no AD)   Wheelchair     Assist Is the patient using a wheelchair?: Yes Type of Wheelchair: Manual Wheelchair activity did not occur: Safety/medical concerns (dizziness, lightheadedness)  Wheelchair assist level: Supervision/Verbal cueing Max wheelchair distance: 156ft    Wheelchair 50 feet with 2 turns activity    Assist    Wheelchair 50 feet with 2 turns activity did not occur: Safety/medical concerns (dizziness, lightheadedness)   Assist Level: Supervision/Verbal cueing   Wheelchair 150 feet activity     Assist  Wheelchair 150 feet activity did not occur: Safety/medical concerns (dizziness, lightheadedness)   Assist Level: Supervision/Verbal cueing   Blood pressure 113/71, pulse 65, temperature 97.9 F (36.6 C), resp. rate 18, height 6\' 3"  (1.905 m), weight 96.9 kg, SpO2 93%.  Medical Problem List and Plan: 1. 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  Antithrombotics: -DVT/anticoagulation:  Pharmaceutical: Eliquis             -antiplatelet therapy: ASA 3. Pain Management: Oxycodone prn for severe pain.              -4/2 history of substance abuse years ago 4. Mood/Behavior/Sleep: LCSW to follow for evaluation and support.              -antipsychotic agents: N/A 5.  Neuropsych/cognition: This patient is capable of making decisions on his own behalf. 6. Skin/Wound Care: Monitor wound for healing.  7. Fluids/Electrolytes/Nutrition:  Monitor I/O. Check CMET in am 8. A fib: Monitor HR TID--continue Amiodarone 400 mg bid             -4/5 heart rate a little low this morning but now in the 80s, decrease amiodarone to 200 mg daily as per cardiothoracic recommendations, decrease metoprolol dose  -4/6 had a couple episodes of bradycardia but overall doing better on lower dose of amiodarone and metoprolol.  Currently heart rate in the 70s and BP stable     03/18/2024    8:00 AM 03/18/2024  5:00 AM 03/18/2024    4:57 AM  Vitals with BMI  Weight  213 lbs 10 oz   BMI  26.7   Systolic 113  106  Diastolic 71  64  Pulse 65  70    9. Orthostatic hypotension: Encourage fluid intake. Midodrine added on 04/02.  TED hose started  - 4/4 reports improved, denies recent symptoms  - 4/5 decrease amiodarone and metoprolol as above  - 4/6 patient denies any further symptoms of orthostatic hypotension 10. Urinary retention: On Flomax and reports voiding without difficulty.  voiding --Foley discontinued on 03/30. Educated on need for PVRs 1-2 days to monitor function.    --has had rise in SCr from 0.9-->1.26, recheck labs tomorrow -4/3 PVR 0 and 87, patient refusing bladder scans.  Appears overall stable will discontinue.  Creatinine improved to 1.14 -4/5 remains continent PVR 98 and 72 11. Acute blood loss anemia/Leukocytosis:   - 4/5 WBC a little higher at 16.7, hemoglobin stable 11.7.  No signs of infection noted continue to monitor 12. Constipation: On dulcolax tabs daily--had BM on 03/31.              -4/2 consider additional medication tomorrow if no Bms  Milk of mag ordered 4/7  13.  Tobacco abuse: "Smoked his last cigarette on way to surgery"  --continue nicotine patch.  -Tobacco cessation counseling  14. Impaired fasting glucose: Hgb A1C- 5.8. continue CM diet.     -CBG stable  15. Hypotension: d/c flomax  LOS: 5 days A FACE TO FACE EVALUATION WAS PERFORMED  Roemello Speyer P Satcha Storlie 03/18/2024, 10:26 AM

## 2024-03-19 LAB — VITAMIN D 25 HYDROXY (VIT D DEFICIENCY, FRACTURES): Vit D, 25-Hydroxy: 66.82 ng/mL (ref 30–100)

## 2024-03-19 MED ORDER — VITAMIN D 25 MCG (1000 UNIT) PO TABS
1000.0000 [IU] | ORAL_TABLET | Freq: Every day | ORAL | Status: DC
  Administered 2024-03-19 – 2024-03-21 (×3): 1000 [IU] via ORAL
  Filled 2024-03-19 (×5): qty 1

## 2024-03-19 MED ORDER — MIDODRINE HCL 5 MG PO TABS
5.0000 mg | ORAL_TABLET | Freq: Three times a day (TID) | ORAL | Status: DC
  Administered 2024-03-19 – 2024-03-21 (×6): 5 mg via ORAL
  Filled 2024-03-19 (×7): qty 1

## 2024-03-19 NOTE — Progress Notes (Signed)
 Patient ID: Dave Williams, male   DOB: May 28, 1956, 68 y.o.   MRN: [id number]  Met with pt to let know can not get a rolloator at his height, discussed [employer] getting one but would need to private pay. He wants the one this worker can get aware it will be short for him. Reached out to other DME companies and hear the same thing. Have made referral to [employer] for the rollator and tub bench. He is feeling better today and hopeful to go home on Thursday

## 2024-03-19 NOTE — Plan of Care (Signed)
  Problem: RH BOWEL ELIMINATION Goal: RH STG MANAGE BOWEL WITH ASSISTANCE Description: STG Manage Bowel with  Mod I Assistance. Outcome: Progressing   Problem: RH BLADDER ELIMINATION Goal: RH STG MANAGE BLADDER WITH ASSISTANCE Description: STG Manage Bladder With  Mod I Assistance Outcome: Progressing   Problem: RH SKIN INTEGRITY Goal: RH STG SKIN FREE OF INFECTION/BREAKDOWN Description: Manage skin free of infection/breakdown with mod I assistance Outcome: Progressing   Problem: RH SAFETY Goal: RH STG ADHERE TO SAFETY PRECAUTIONS W/ASSISTANCE/DEVICE Description:  Adhere to Safety Precautions With  mod I Assistance/Device. Outcome: Progressing   Problem: RH PAIN MANAGEMENT Goal: RH STG PAIN MANAGED AT OR BELOW PT'S PAIN GOAL Description: <4 w/ prns Outcome: Progressing   Problem: RH KNOWLEDGE DEFICIT GENERAL Goal: RH STG INCREASE KNOWLEDGE OF SELF CARE AFTER HOSPITALIZATION Description: Manage increase knowledge of self care after hospitalization deficit  with mod I assistance using educational materials provided Outcome: Progressing

## 2024-03-19 NOTE — Progress Notes (Addendum)
 Orthostatic Vital Signs Lying- BP 125/73 (90) manual HR 60   Sitting- BP 107/76 (86) manual HR 67    Standing for 3 minutes- BP   88/68 (75) manual HR 67   Per MD give amiodarone at the time. Tilden Dome, LPN

## 2024-03-19 NOTE — Progress Notes (Signed)
 Occupational Therapy Discharge Summary  Patient Details  Name: Dave Williams MRN: [id number] Date of Birth: 1955-12-19  Date of Discharge from OT service:April {NUMBERS 1-31 (DATE):31396}, 2025   Patient has met 11 of 11 long term goals due to improved activity tolerance, improved balance, ability to compensate for deficits, and improved coordination.  Patient to discharge at overall Modified Independent level.  Patient to discharge independently without assist.  All goals met  Recommendation:  No follow up at this time  Equipment: TTB  Reasons for discharge: treatment goals met  Patient/family agrees with progress made and goals achieved: Yes  OT Discharge Precautions/Restrictions  Precautions Precautions: Fall;Sternal Precaution/Restrictions Comments: monitor HR and BP Restrictions Weight Bearing Restrictions Per Provider Order: No ADL ADL Equipment Provided: Reacher Eating: Independent Where Assessed-Eating: Chair Grooming: Independent Where Assessed-Grooming: Standing at sink Upper Body Bathing: Modified independent Where Assessed-Upper Body Bathing: Shower Lower Body Bathing: Modified independent Where Assessed-Lower Body Bathing: Shower Upper Body Dressing: Modified independent (Device) Where Assessed-Upper Body Dressing: Edge of bed Lower Body Dressing: Modified independent Where Assessed-Lower Body Dressing: Sitting at sink, Standing at sink Toileting: Modified independent Where Assessed-Toileting: Neurosurgeon Method: Proofreader: Raised toilet seat, Other (comment) (rollator) Tub/Shower Transfer: Modified independent Web designer Method: Ship broker: Insurance underwriter: Modified independent Film/video editor Method: Designer, industrial/product: Emergency planning/management officer, Grab bars Vision Baseline Vision/History: 1 Wears  glasses Patient Visual Report: No change from baseline;Blurring of vision Vision Assessment?: No apparent visual deficits Perception  Perception: Within Functional Limits Praxis Praxis: WFL Cognition   Sensation   Motor    Mobility     Trunk/Postural Assessment     Balance   Extremity/Trunk Assessment RUE Assessment RUE Assessment: Within Functional Limits General Strength Comments: Not formally assessed due to sternal precautions LUE Assessment LUE Assessment: Within Functional Limits General Strength Comments: Not formally assessed due to sternal precautions   Melvyn Novas, MS, OTR/L  03/19/2024, 2:21 PM

## 2024-03-19 NOTE — Progress Notes (Signed)
 HR 41 this am (manual HR). Hold amiodarone per MD.    Tilden Dome, LPN

## 2024-03-19 NOTE — Plan of Care (Signed)
  Problem: Consults Goal: RH GENERAL PATIENT EDUCATION Description: See Patient Education module for education specifics. Outcome: Progressing   Problem: RH BOWEL ELIMINATION Goal: RH STG MANAGE BOWEL WITH ASSISTANCE Description: STG Manage Bowel with  Mod I Assistance. Outcome: Progressing   Problem: RH BLADDER ELIMINATION Goal: RH STG MANAGE BLADDER WITH ASSISTANCE Description: STG Manage Bladder With  Mod I Assistance Outcome: Progressing   Problem: RH SKIN INTEGRITY Goal: RH STG SKIN FREE OF INFECTION/BREAKDOWN Description: Manage skin free of infection/breakdown with mod I assistance Outcome: Progressing   Problem: RH SAFETY Goal: RH STG ADHERE TO SAFETY PRECAUTIONS W/ASSISTANCE/DEVICE Description:  Adhere to Safety Precautions With  mod I Assistance/Device. Outcome: Progressing   Problem: RH PAIN MANAGEMENT Goal: RH STG PAIN MANAGED AT OR BELOW PT'S PAIN GOAL Description: <4 w/ prns Outcome: Progressing   Problem: RH KNOWLEDGE DEFICIT GENERAL Goal: RH STG INCREASE KNOWLEDGE OF SELF CARE AFTER HOSPITALIZATION Description: Manage increase knowledge of self care after hospitalization deficit  with mod I assistance using educational materials provided Outcome: Progressing

## 2024-03-19 NOTE — Progress Notes (Signed)
 PROGRESS NOTE   Subjective/Complaints: HR 41 this morning, advised nursing to hold amio, recheck HR during PT with Tobi Bastos, and administer amiodarone if HR is >60, d/ced metoprolol HS  ROS: Patient denies fever, new vision changes, dizziness, nausea, vomiting, diarrhea,  shortness of breath or chest pain, headache, or mood change. + Constipation-improved Chest wall soreness improving +orthostasis- improved   Objective:   No results found. Recent Labs    03/18/24 0541  WBC 13.2*  HGB 11.5*  HCT 34.3*  PLT 600*   Recent Labs    03/18/24 0541  NA 135  K 4.3  CL 104  CO2 23  GLUCOSE 111*  BUN 13  CREATININE 1.25*  CALCIUM 9.0     Intake/Output Summary (Last 24 hours) at 03/19/2024 0953 Last data filed at 03/19/2024 0804 Gross per 24 hour  Intake 640 ml  Output 2300 ml  Net -1660 ml        Physical Exam: Vital Signs Blood pressure 105/65, pulse (!) 41, temperature 97.6 F (36.4 C), resp. rate 16, height 6\' 3"  (1.905 m), weight 96.7 kg, SpO2 95%.  General: No apparent distress, appears comfortable laying in bed HEENT: Head is normocephalic, atraumatic,  oral mucosa pink and moist, decreased dentition  Neck: Supple without JVD or lymphadenopathy Heart: Bradycardia Chest: CTA bilaterally without wheezes, rales, or rhonchi; no distress Abdomen: Soft, non-tender, mildly-distended, bowel sounds normoactive Extremities: No clubbing, cyanosis, or edema. Pulses are 2+ Psych: Pt's affect is appropriate. Pt is cooperative Skin: Sternal incision CDI, right leg graft site appears to be healing well   Neuro:     Mental Status: AAOx4, no apparent cognitive deficits, cranial nerves II through XII grossly intact   MOTOR: RUE: At least 4 out of 5 limited by sternal precautions  LUE: At least 4 out of 5 limited by sternal precautions RLE: HF 5/5, KE 5/5, ADF 5/5, APF 5/5 LLE: HF 5/5, KE 5/5, ADF 5/5, APF  5/5   SENSORY: Normal to touch all 4 extremities, altered on the left side of his surgical incision, stable 4/7      Assessment/Plan: 1. Functional deficits which require 3+ hours per day of interdisciplinary therapy in a comprehensive inpatient rehab setting. Physiatrist is providing close team supervision and 24 hour management of active medical problems listed below. Physiatrist and rehab team continue to assess barriers to discharge/monitor patient progress toward functional and medical goals  Care Tool:  Bathing    Body parts bathed by patient: Right arm, Left arm, Chest, Abdomen, Front perineal area, Right upper leg, Left upper leg, Face   Body parts bathed by helper: Buttocks, Left lower leg, Right lower leg     Bathing assist Assist Level: Minimal Assistance - Patient > 75%     Upper Body Dressing/Undressing Upper body dressing   What is the patient wearing?: Pull over shirt    Upper body assist Assist Level: Supervision/Verbal cueing    Lower Body Dressing/Undressing Lower body dressing      What is the patient wearing?: Pants     Lower body assist Assist for lower body dressing: Minimal Assistance - Patient > 75%     Toileting Toileting Toileting Activity  did not occur Press photographer and hygiene only): N/A (no void or bm)  Toileting assist Assist for toileting: Moderate Assistance - Patient 50 - 74%     Transfers Chair/bed transfer  Transfers assist     Chair/bed transfer assist level: Supervision/Verbal cueing     Locomotion Ambulation   Ambulation assist   Ambulation activity did not occur:  (dizziness, lightheadedness)  Assist level: Supervision/Verbal cueing Assistive device: Rollator Max distance: 37ft   Walk 10 feet activity   Assist  Walk 10 feet activity did not occur:  (dizziness, lightheadedness)  Assist level: Supervision/Verbal cueing Assistive device: Rollator   Walk 50 feet activity   Assist Walk 50 feet  with 2 turns activity did not occur:  (dizziness, lightheadedness)  Assist level: Supervision/Verbal cueing Assistive device: Rollator    Walk 150 feet activity   Assist Walk 150 feet activity did not occur:  (dizziness, lightheadedness)  Assist level: Supervision/Verbal cueing Assistive device: Rollator    Walk 10 feet on uneven surface  activity   Assist Walk 10 feet on uneven surfaces activity did not occur:  (dizziness, lightheadedness)   Assist level: Minimal Assistance - Patient > 75% Assistive device: Other (comment) (no AD)   Wheelchair     Assist Is the patient using a wheelchair?: Yes Type of Wheelchair: Manual Wheelchair activity did not occur: Safety/medical concerns (dizziness, lightheadedness)  Wheelchair assist level: Supervision/Verbal cueing Max wheelchair distance: 139ft    Wheelchair 50 feet with 2 turns activity    Assist    Wheelchair 50 feet with 2 turns activity did not occur: Safety/medical concerns (dizziness, lightheadedness)   Assist Level: Supervision/Verbal cueing   Wheelchair 150 feet activity     Assist  Wheelchair 150 feet activity did not occur: Safety/medical concerns (dizziness, lightheadedness)   Assist Level: Supervision/Verbal cueing   Blood pressure 105/65, pulse (!) 41, temperature 97.6 F (36.4 C), resp. rate 16, height 6\' 3"  (1.905 m), weight 96.7 kg, SpO2 95%.  Medical Problem List and Plan: 1. 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  Antithrombotics: -DVT/anticoagulation:  Pharmaceutical: Eliquis             -antiplatelet therapy: ASA 3. Pain Management: Oxycodone prn for severe pain.              -4/2 history of substance abuse years ago 4. Mood/Behavior/Sleep: LCSW to follow for evaluation and support.              -antipsychotic agents: N/A 5.  Neuropsych/cognition: This patient is capable of making decisions on his own behalf. 6. Skin/Wound Care: Monitor wound for healing.  7. Fluids/Electrolytes/Nutrition:  Monitor I/O. Check CMET in am 8. A fib: Monitor HR TID--continue Amiodarone 400 mg bid             -4/5 heart rate a little low this morning but now in the 80s, decrease amiodarone to 200 mg daily as per cardiothoracic recommendations, decrease metoprolol dose  -4/6 had a couple episodes of bradycardia but overall doing better on lower dose of amiodarone and metoprolol.  Currently heart rate in the 70s and BP stable     03/19/2024    8:01 AM 03/19/2024    4:13  AM 03/19/2024    4:11 AM  Vitals with BMI  Weight  213 lbs 3 oz   BMI  26.65   Systolic 105  97  Diastolic 65  63  Pulse 41  62    9. Orthostatic hypotension: Encourage fluid intake. Midodrine added on 04/02.  TED hose started  - 4/4 reports improved, denies recent symptoms  - 4/5 decrease amiodarone and metoprolol as above  - 4/6 patient denies any further symptoms of orthostatic hypotension 10. Urinary retention: On Flomax and reports voiding without difficulty.  voiding --Foley discontinued on 03/30. Educated on need for PVRs 1-2 days to monitor function.    --has had rise in SCr from 0.9-->1.26, recheck labs tomorrow -4/3 PVR 0 and 87, patient refusing bladder scans.  Appears overall stable will discontinue.  Creatinine improved to 1.14 -4/5 remains continent PVR 98 and 72 11. Acute blood loss anemia/Leukocytosis:   - 4/5 WBC a little higher at 16.7, hemoglobin stable 11.7.  No signs of infection noted continue to monitor 12. Constipation: On dulcolax tabs daily--had BM on 03/31.              -4/2 consider additional medication tomorrow if no Bms  Milk of mag ordered 4/7  13.  Tobacco abuse: "Smoked his last cigarette on way to surgery"  --continue nicotine patch.  -Tobacco cessation counseling  14. Impaired fasting glucose: Hgb A1C- 5.8. continue CM diet.     -CBG stable  15. Hypotension: d/c flomax, d/c metoprolol  16. Bradycardia: d/c metoprolol  17. Screening for vitamin D deficiency: check vitamin D level  LOS: 6 days A FACE TO FACE EVALUATION WAS PERFORMED  Drema Pry Jeffifer Rabold 03/19/2024, 9:53 AM

## 2024-03-19 NOTE — Progress Notes (Signed)
 Occupational Therapy Session Note  Patient Details  Name: Dave Williams MRN: [id number] Date of Birth: 07/08/1956  Today's Date: 03/19/2024 OT Individual Time: 1120-1200 & 4098-1191 OT Individual Time Calculation (min): 40 min & 40 min   Short Term Goals: Week 1:  OT Short Term Goal 1 (Week 1): Patient will transfer to toilet with close supervision following set up OT Short Term Goal 2 (Week 1): Patient will don socks with min assist OT Short Term Goal 3 (Week 1): STG=LTG due to short LOS  Skilled Therapeutic Interventions/Progress Updates:  Session 1 Skilled OT intervention completed with focus on ambulatory/cardiovascular endurance, BUE strengthening. Pt received supine in bed, agreeable to session. Intermittent sternal pain reported with coughing; pre-medicated. OT offered rest breaks, repositioning throughout for pain reduction.  Pt verbalized fatigue with preference to sleep, but with light encouragement was agreeable to get OOB. Transitioned to EOB with mod I, with good adherence to sternal precautions. Denied light headedness with mobility during session.  Completed all sit > stands and ambulatory transfers with supervision using rollator during session. Ambulated about 500 ft around rehab dept without LOB, requiring 2 seated rest breaks intermittently.   Seated EOM, pt completed the following BUE exercises to promote BUE strength/endurance needed for independence with functional transfers and BADLs: (With 4 lb dumbbell) -2x20 bicep curls (With 3 lb dumbbell) -2x10 chest press -2x10 front shoulder raise below 90 deg  Ambulated back to room, transitioned sit > supine with mod I, then remained semi upright in bed, with bed alarm on/activated, and with all needs in reach at end of session.  Session 2 Skilled OT intervention completed with focus on cardiovascular and ambulatory endurance. Pt received semi upright in bed, agreeable to session. No pain reported.  Pt transitioned  to EOB mod I, completed all sit > stands and ambulatory transfers > household distance with rollator and distant supervision.   Ambulated > ortho gym. Pt completed the following intervals on nustep to promote global/cardiovascular endurance using BLE only needed for independence with BADLs and functional mobility: -10 mins, level 3  Retrieved bean bags from around gym at varying levels including waist and over head height without LOB. Mild inattention to overhead items but overall no difficulty with task. Discussed retrieval of items at floor level using reacher with pt plans to purchase.   Ambulated back to room, sit > supine mod I. Pt remained semi upright in bed, with bed alarm on/activated, and with all needs in reach at end of session.   Therapy Documentation Precautions:  Precautions Precautions: Fall, Sternal Precaution Booklet Issued: No Precaution/Restrictions Comments: watch orthostatics Restrictions Weight Bearing Restrictions Per Provider Order: No Other Position/Activity Restrictions: sternal precautions    Therapy/Group: Individual Therapy  Ulonda Klosowski E Toluwani Ruder, MS, OTR/L  03/19/2024, 2:10 PM

## 2024-03-19 NOTE — Progress Notes (Signed)
 Physical Therapy Session Note  Patient Details  Name: Dave Williams MRN: [id number] Date of Birth: 17-Jun-1956  Today's Date: 03/19/2024 PT Individual Time: 6962-9528 and 4132-4401 PT Individual Time Calculation (min): 71 min and 33 min  Short Term Goals: Week 1:  PT Short Term Goal 1 (Week 1): STG=LTG due to LOS  Skilled Therapeutic Interventions/Progress Updates:   Treatment Session 1 Received pt sitting in recliner with MD at bedside for morning rounds. Pt agreeable to PT treatment and denied any pain during session. Session with emphasis on functional mobility/transfers, generalized strengthening and endurance, dynamic standing balance/coordination, simulated car transfers, and gait training. Donned knee high teds and non-skid socks dependently for BP management.   Pt performed all transfers with rollator and supervision throughout session. Pt ambulated >320ft with rollator and supervision/mod I to ortho gym. Pt performed simulated car transfer with rollator and supervision, then ambulated 46ft on uneven surfaces (ramp) with rollator and supervision/mod I. Pt ambulated 158ft with rollator and supervision/mod I to main therapy gym. Pt able to stand and pick up object from floor without AD and reacher with supervision - dicussed recommendation to purchase reacher for home use and looked as various reachers on [employer].   Pt ambulated 1,433ft with grocery cart and supervision/mod I with no standing or seated rest breaks. Pt with improvements in cadence and sequencing with stepping pattern and reported feeling "himself again". Pt with excellent ability to recognize fatigue onset and pace himself throughout. Pt performed standing alternating toe taps to 3 cones 2x3 trials without UE support and CGA for balance. Ambulated 163ft with rollator and supervision back to room and transitioned into supine mod I. RN arrived to assess orthostatics and pt left in care of RN.   Vitals: Sitting in recliner (no  teds): 103/66 Standing (teds on): 94/64 Sitting after ambulating to ortho gym (teds on): 12876 Sitting after ambulating to main gym (teds on): 103/61 Sitting after ambulating 1,427ft (teds on): 114/88   Treatment Session 2 Received pt semi-reclined in bed, pt agreeable to PT treatment, and denied any pain during session. Session with emphasis on functional mobility/transfers, generalized strengthening and endurance, and gait training. Pt transferred supine<>sitting R EOB from flat bed without bedrails and mod I. Pt performed all transfers with rollator and supervision/mod I throughout session. Pt ambulated 134ft x 2 trials with rollator and supervision/mod I to/from dayroom. Pt performed seated BLE strengthening on Kinetron at 20 cm/sec for 1 minute decreasing to 30 cm/sec for 1 minute x 3 trials with emphasis on glute/quad strength. Pt performed TUG with rollator and supervision with average of 18.3 seconds - pt educated on test results and significance indicating high fall risk and importance of using AD upon discharge. Trail 1: 18 seconds Trial 2: 18 seconds Trial 3: 19 seconds Returned to room and assessed BP: 102/70 and HR 76bpm. Pt ambulated into bathroom with rollator and mod I and left in bathroom with all needs within reach. Pt made mod I in room.  Therapy Documentation Precautions:  Precautions Precautions: Fall, Sternal Precaution Booklet Issued: No Precaution/Restrictions Comments: watch orthostatics Restrictions Weight Bearing Restrictions Per Provider Order: No RUE Weight Bearing Per Provider Order: Non weight bearing LUE Weight Bearing Per Provider Order: Non weight bearing Other Position/Activity Restrictions: sternal precautions  Therapy/Group: Individual Therapy Marlana Salvage Zaunegger Blima Rich PT, DPT 03/19/2024, 6:54 AM

## 2024-03-20 ENCOUNTER — Other Ambulatory Visit (HOSPITAL_COMMUNITY): Payer: Self-pay

## 2024-03-20 MED ORDER — APIXABAN 5 MG PO TABS
5.0000 mg | ORAL_TABLET | Freq: Two times a day (BID) | ORAL | 0 refills | Status: DC
  Filled 2024-03-20: qty 60, 30d supply, fill #0

## 2024-03-20 MED ORDER — METOPROLOL TARTRATE 12.5 MG HALF TABLET: 12.5000 mg | ORAL_TABLET | Freq: Once | ORAL | Status: DC

## 2024-03-20 MED ORDER — POLYETHYLENE GLYCOL 3350 17 GM/SCOOP PO POWD
17.0000 g | Freq: Every day | ORAL | 0 refills | Status: DC | PRN
  Filled 2024-03-20: qty 238, 14d supply, fill #0

## 2024-03-20 MED ORDER — PANTOPRAZOLE SODIUM 40 MG PO TBEC
40.0000 mg | DELAYED_RELEASE_TABLET | Freq: Every day | ORAL | 0 refills | Status: AC
  Filled 2024-03-20: qty 30, 30d supply, fill #0

## 2024-03-20 MED ORDER — ASPIRIN 81 MG PO TBEC: 81.0000 mg | DELAYED_RELEASE_TABLET | Freq: Every day | ORAL | 0 refills | Status: DC

## 2024-03-20 MED ORDER — MELATONIN 5 MG PO TABS
5.0000 mg | ORAL_TABLET | Freq: Every evening | ORAL | 0 refills | Status: DC | PRN
  Filled 2024-03-20: qty 30, 30d supply, fill #0

## 2024-03-20 MED ORDER — ATORVASTATIN CALCIUM 80 MG PO TABS: 80.0000 mg | ORAL_TABLET | Freq: Every day | ORAL | 0 refills | Status: DC

## 2024-03-20 MED ORDER — MIDODRINE HCL 5 MG PO TABS
5.0000 mg | ORAL_TABLET | Freq: Three times a day (TID) | ORAL | 0 refills | Status: DC
  Filled 2024-03-20: qty 90, 30d supply, fill #0

## 2024-03-20 MED ORDER — AMIODARONE HCL 200 MG PO TABS
200.0000 mg | ORAL_TABLET | Freq: Every day | ORAL | 0 refills | Status: DC
  Filled 2024-03-20: qty 30, 30d supply, fill #0

## 2024-03-20 MED ORDER — VITAMIN D3 25 MCG PO TABS
1000.0000 [IU] | ORAL_TABLET | Freq: Every day | ORAL | 0 refills | Status: AC
  Filled 2024-03-20: qty 30, 30d supply, fill #0

## 2024-03-20 NOTE — Plan of Care (Signed)
  Problem: RH Balance Goal: LTG Patient will maintain dynamic standing with ADLs (OT) Description: LTG:  Patient will maintain dynamic standing balance with assist during activities of daily living (OT)  Outcome: Completed/Met   Problem: Sit to Stand Goal: LTG:  Patient will perform sit to stand in prep for activites of daily living with assistance level (OT) Description: LTG:  Patient will perform sit to stand in prep for activites of daily living with assistance level (OT) Outcome: Completed/Met   Problem: RH Bathing Goal: LTG Patient will bathe all body parts with assist levels (OT) Description: LTG: Patient will bathe all body parts with assist levels (OT) Outcome: Completed/Met   Problem: RH Dressing Goal: LTG Patient will perform upper body dressing (OT) Description: LTG Patient will perform upper body dressing with assist, with/without cues (OT). Outcome: Completed/Met Goal: LTG Patient will perform lower body dressing w/assist (OT) Description: LTG: Patient will perform lower body dressing with assist, with/without cues in positioning using equipment (OT) Outcome: Completed/Met   Problem: RH Toileting Goal: LTG Patient will perform toileting task (3/3 steps) with assistance level (OT) Description: LTG: Patient will perform toileting task (3/3 steps) with assistance level (OT)  Outcome: Completed/Met   Problem: RH Simple Meal Prep Goal: LTG Patient will perform simple meal prep w/assist (OT) Description: LTG: Patient will perform simple meal prep with assistance, with/without cues (OT). Outcome: Completed/Met   Problem: RH Laundry Goal: LTG Patient will perform laundry w/assist, cues (OT) Description: LTG: Patient will perform laundry with assistance, with/without cues (OT). Outcome: Completed/Met   Problem: RH Light Housekeeping Goal: LTG Patient will perform light housekeeping w/assist (OT) Description: LTG: Patient will perform light housekeeping with assistance,  with/without cues (OT). Outcome: Completed/Met   Problem: RH Toilet Transfers Goal: LTG Patient will perform toilet transfers w/assist (OT) Description: LTG: Patient will perform toilet transfers with assist, with/without cues using equipment (OT) Outcome: Completed/Met   Problem: RH Tub/Shower Transfers Goal: LTG Patient will perform tub/shower transfers w/assist (OT) Description: LTG: Patient will perform tub/shower transfers with assist, with/without cues using equipment (OT) Outcome: Completed/Met

## 2024-03-20 NOTE — Progress Notes (Signed)
 Discussed EKG with Dr. Cristy Friedlander felt the ST changes likely present before but hard to view due to SVT/AFib. Patient asymptomatic--he recommended repeat EKG in am to monitor for stability. Ordered.

## 2024-03-20 NOTE — Progress Notes (Signed)
 Patient ID: Dave Williams, male   DOB: Dec 07, 1956, 68 y.o.   MRN: [id number]  Met with pt to give team conference update met goals of mod/I and made mod/I in room today. Discharge set for tomorrow. Pt has ordered rollator and 3 in 1 from [facility]. Cardiac rehab referral made while on acute. No other needs.

## 2024-03-20 NOTE — Discharge Summary (Signed)
 Physician Discharge Summary  Patient ID: Dave Williams MRN: [id number] DOB/AGE: January 10, 1956 68 y.o.  Admit date: 03/13/2024 Discharge date: 03/21/2024  Discharge Diagnoses:  Principal Problem:   Debility Active Problems:   History of hypertension   Hypercholesterolemia   S/P CABG x 3   Acute blood loss anemia   Leucocytosis   Urinary retention with incomplete bladder emptying   Discharged Condition: stable  Significant Diagnostic Studies: N/A   Labs:  Basic Metabolic Panel:    Latest Ref Rng & Units 03/21/2024    5:30 AM 03/18/2024    5:41 AM 03/14/2024    6:24 AM  BMP  Glucose 70 - 99 mg/dL 782  956  213   BUN 8 - 23 mg/dL 14  13  17    Creatinine 0.61 - 1.24 mg/dL 0.86  5.78  4.69   Sodium 135 - 145 mmol/L 135  135  133   Potassium 3.5 - 5.1 mmol/L 4.2  4.3  4.3   Chloride 98 - 111 mmol/L 102  104  102   CO2 22 - 32 mmol/L 23  23  22    Calcium  8.9 - 10.3 mg/dL 9.2  9.0  8.9      CBC: No results for input(s): "WBC", "NEUTROABS", "HGB", "HCT", "MCV", "PLT" in the last 168 hours.   CBG: No results for input(s): "GLUCAP" in the last 168 hours.  Brief HPI:   Vidur Knust is a 68 y.o. male with history of HTN, chronic left hip pain, TIA, substance abuse in the past, tobacco use, unstable angina was found to have CAD and admitted on 03/02/2021 for CABG x 3 by Dr. Deloise Ferries.  Postop weaned.  Has been show significant for hematuria as well as issues with urinary retention.  He did develop SVT with heart rates up in 140s.  With amiodarone  bolus and beta-blocker.  He has also had issues with orthostatic symptoms as well as recurrent SVT.  Dr. Alvis Ba recommended Eliquis  as well as holding of Flomax  as soon as able.  Fluid overload has resolved with intermittent diuresis.  Midodrine  was added due to recurrent issues with dizziness and orthostatic hypotension.  Therapy is working with patient was requiring min assist with ADLs and mobility.  He was in the [employer]  CIR was  recommended due to functional decline.   Hospital Course: Westlee Devita was admitted to rehab 03/13/2024 for inpatient therapies to consist of PT and OT at least three hours five days a week. Past admission physiatrist, therapy team and rehab RN have worked together to provide customized collaborative inpatient rehab.  He blood pressures were monitored on TID basis and improving.  He has chest soreness has improved and incision is healing well without s/s of infection. Blood sugars were monitored briefly with ac/hs CBG checks and was noted to be well controlled therefore BS checks discontinued.  Amiodarone  was continued down to daily and heart rate has been stable with increase in activity.  He was willing to have PVR checks for 24 hours which were reasonable and was reporting to be voiding without difficulty.  Flomax  was discontinued due to orthostatic symptoms and as he is voiding without difficulty.  He has been medically cleared use of oxycodone .  He is showing decrease use of narcotics and was weaned down to home hydrocodone  by discharge.  Follow-up CBC shows H&H to be stable and reactive leukocytosis is resolving.  Bowel program has been augmented to help manage constipation.  He has had issues with orthostatic symptoms  which is resolving with pacing.  He was advised to continue using teds when symptomatic.  EKG was checked due to reports of sensation and questionable new ST changes.  EKG was evaluated by cardiology who recommended repeat EKG which was stable and he is to follow-up in office after his rehab stay and is modified independent in supervised setting.  He was advised to continue sternal precautions until follow-up with surgery.  Referral was made for cardiac rehab after discharge.   Rehab course:  During patient's stay in rehab weekly team conferences were held to monitor patient's progress, set goals and discuss barriers to discharge. At admission, patient required min assist with mobility  and with ADL tasks.He  has had improvement in activity tolerance, balance, postural control as well as ability to compensate for deficits.  He is able to complete ADL tasks at modified independent level.  He is modified independent for transfers and to ambulate 150 feet with use of rollator.    Discharge disposition: 01-Home or Self Care  Diet: Heart healthy.   Special Instructions: Continue sternal precautions. NO driving till cleared by  MD 2.  Need to increase fluid intake. Toilet every 4 hours while awake 3. Recommend repeat BMET and CBC in 1-2 weeks to monitor renal status, ABLA and WBC.   Allergies as of 03/21/2024   No Known Allergies      Medication List     STOP taking these medications    diphenhydramine -acetaminophen  25-500 MG Tabs tablet Commonly known as: TYLENOL  PM   metoprolol  succinate 25 MG 24 hr tablet Commonly known as: Toprol  XL   tamsulosin  0.4 MG Caps capsule Commonly known as: FLOMAX    Vitamin D  (Ergocalciferol ) 1.25 MG (50000 UNIT) Caps capsule Commonly known as: DRISDOL       TAKE these medications    amiodarone  200 MG tablet Commonly known as: PACERONE  Take 1 tablet (200 mg total) by mouth daily. What changed:  how much to take when to take this additional instructions   aspirin  EC 81 MG tablet Take 1 tablet (81 mg total) by mouth daily. Swallow whole.   atorvastatin  80 MG tablet Commonly known as: LIPITOR  Take 1 tablet (80 mg total) by mouth daily with supper. What changed: when to take this   Eliquis  5 MG Tabs tablet Generic drug: apixaban  Take 1 tablet (5 mg total) by mouth 2 (two) times daily.   HYDROcodone -acetaminophen  7.5-325 MG tablet Commonly known as: NORCO Take 1 tablet by mouth every 6 (six) hours as needed for moderate pain (pain score 4-6). Notes to patient: Resume at home--be judicious.   melatonin 5 MG Tabs Take 1 tablet (5 mg total) by mouth at bedtime as needed.   midodrine  5 MG tablet Commonly known as:  PROAMATINE  Take 1 tablet (5 mg total) by mouth 3 (three) times daily with meals.   nicotine  14 mg/24hr patch Commonly known as: NICODERM CQ  - dosed in mg/24 hours Place 1 patch (14 mg total) onto the skin daily.   nitroGLYCERIN  0.4 MG SL tablet Commonly known as: NITROSTAT  DISSOLVE 1 TABLET UNDER THE TONGUE EVERY 5 MINUTES AS NEEDED FOR CHEST PAIN. DO NOT EXCEED A TOTAL OF 3 DOSES IN 15 MINUTES. What changed: See the new instructions.   pantoprazole  40 MG tablet Commonly known as: PROTONIX  Take 1 tablet (40 mg total) by mouth daily.   polyethylene glycol powder 17 GM/SCOOP powder Commonly known as: GLYCOLAX /MIRALAX  Take 1 capful (17 g) with water by mouth daily as needed for mild  constipation. Notes to patient: Make sure you have a BM daily and do not strain.    vitamin D3 25 MCG tablet Commonly known as: CHOLECALCIFEROL  Take 1 tablet (1,000 Units total) by mouth daily.         Follow-up Information     Caresse Chant, FNP Follow up.   Specialty: Family Medicine Why: Call in 1-2 days for post hospital follow up Contact information: [address] [place] [address] [phone]         Liam Redhead, MD Follow up.   Specialty: Physical Medicine and Rehabilitation Contact information: [address] [place] [address] [phone]                 Signed: Zelda Hickman 03/31/2024, 9:16 PM

## 2024-03-20 NOTE — Patient Care Conference (Signed)
 Inpatient RehabilitationTeam Conference and Plan of Care Update Date: 03/20/2024   Time: 11:44 AM    Patient Name: Dave Williams      Medical Record Number: [id number]  Date of Birth: 1956/04/15 Sex: Male         Room/Bed: 4W16C/4W16C-01 Payor Info: Payor: [employer] MEDICARE / Plan: [employer] MEDICARE HMO/PPO / Product Type: *No Product type* /    Admit Date/Time:  03/13/2024  3:44 PM  Primary Diagnosis:  Debility  Hospital Problems: Principal Problem:   Debility    Expected Discharge Date: Expected Discharge Date: 03/21/24  Team Members Present: Physician leading conference: Dr. Sula Soda Social Worker Present: Dossie Der, LCSW Nurse Present: Chana Bode, RN PT Present: Blima Rich, PT OT Present: Candee Furbish, OT     Current Status/Progress Goal Weekly Team Focus  Bowel/Bladder   Patient is continent of bladder and bowel, LBM 03/19/24   Maintain continence   Independent    Swallow/Nutrition/ Hydration               ADL's   Set up A UB, Supervision LB and toileting   Mod I   Barriers- fluctuating BP, sternal precautions, functional endurance deficits, no assist at DC    Mobility   bed mobility supervision/mod I, transfers with rollator supervision, gait >560ft with rollator supervision, 12 6in steps with 2 handrails CGA   Mod I  barriers: low BP, lightheadedness/dizziness, sternal precautions, global weakness/deconditioning    Communication                Safety/Cognition/ Behavioral Observations               Pain   Stats occassional flare up of soreness to surgical chest site,   < 2-3 on pain scale   Medication prn as needed for generalize pain/discomfort    Skin   Surgical,incision healing well, site intact and unremarkable   Incision sites free of infection  [place] amf follow up  for discomfort      Discharge Planning:  Home alone with neighbors and friends checking in on him. [employer] co-pays for equipment and may wait until gets  home to get due to no money/cards here   Team Discussion: Patient post CABG with debility. Limited by tachycardia and syncopal episodes.  Patient on target to meet rehab goals: yes, currently mod I overall, and able to use a rollator during ambulation or manage steps.   *See Care Plan and progress notes for long and short-term goals.   Revisions to Treatment Plan:  EKG- WNL MD adjusting medications   Teaching Needs: Safety, medications, dietary modification, transfers, toileting, sternal precautions, etc.   Current Barriers to Discharge: Decreased caregiver support  Possible Resolutions to Barriers: OP cardiac rehab when cleared of sternal precautions     Medical Summary Current Status: alternative bradycardia and tachycardia, atrial fibrillation, overweight, constipation, hypotension, s/p CABG  Barriers to Discharge: Medical stability  Barriers to Discharge Comments: alternative bradycardia and tachycardia, atrial fibrillation, overweight, constipation, hypotension, s/p CABG Possible Resolutions to Barriers/Weekly Focus: continue to monitor HR, repeat EKG today, continue amiodarone, continue midodrine TID, continue bisacodyl   Continued Need for Acute Rehabilitation Level of Care: The patient requires daily medical management by a physician with specialized training in physical medicine and rehabilitation for the following reasons: Direction of a multidisciplinary physical rehabilitation program to maximize functional independence : Yes Medical management of patient stability for increased activity during participation in an intensive rehabilitation regime.: Yes Analysis of laboratory values and/or  radiology reports with any subsequent need for medication adjustment and/or medical intervention. : Yes   I attest that I was present, lead the team conference, and concur with the assessment and plan of the team.   Chana Bode B 03/20/2024, 2:30 PM

## 2024-03-20 NOTE — Progress Notes (Signed)
 Physical Therapy Discharge Summary  Patient Details  Name: Dave Williams MRN: [id number] Date of Birth: February 02, 1956  Date of Discharge from PT service:March 20, 2024  Today's Date: 03/20/2024 PT Individual Time: 0731-0843 PT Individual Time Calculation (min): 72 min   Patient has met 7 of 7 long term goals due to improved activity tolerance, improved balance, improved postural control, increased strength, decreased pain, ability to compensate for deficits, improved awareness, and improved coordination.  Patient to discharge at an ambulatory level Modified Independent using rollator.   All goals met  Recommendation:  Patient will benefit from ongoing skilled PT services in  outpatient cardiac rehab  to continue to advance safe functional mobility, address ongoing impairments in transfers, generalized strengthening and endurance, dynamic standing balance/coordination, gait training, and to minimize fall risk.  Equipment: Rollator  Reasons for discharge: treatment goals met  Patient/family agrees with progress made and goals achieved: Yes  Today's interventions: Received pt sitting in recliner, pt agreeable to PT treatment, and denied any pain during session. Session with emphasis on discharge planning, functional mobility/transfers, generalized strengthening and endurance, dynamic standing balance/coordination, and gait training. BP sitting in recliner at start of session: 107/69 HR 77bpm. Of note, pt able to complete entire session without compression socks on.   Went through sensation, MMT, and pain interference questionnaire in preparation for discharge. Pt performed all transfers with rollator and mod I throughout session. Pt ambulated 111ft with rollator and mod I to main therapy gym - BP: 112/59. Ambulated to staircase and navigated 12 6in steps with bilateral handrails and mod I ascending and descending with a step through pattern - 106/69. Ambulated to [employer] and worked on dynamic  standing balance playing catch baseball game on medium difficulty for 2 minutes with BUE support and supervision - limited by reports of feeling "sweaty" - BP: 104/86  Pt then ambulated additional 121ft with rollator and mod I to dayroom - BP: 115/76. Pt stepped on/off scale with supervision (weight 206lb). Pt then ambulated 112ft with rollator and mod I back to room. Assisted pt with looking for rollators on [employer] due to copay per CSW. Concluded session with pt sitting in recliner with all needs within reach. Pt mod I in room.   PT Discharge Precautions/Restrictions Precautions Precautions: Fall;Sternal Precaution/Restrictions Comments: monitor HR and BP Restrictions Weight Bearing Restrictions Per Provider Order: No Pain Interference Pain Interference Pain Effect on Sleep: 2. Occasionally Pain Interference with Therapy Activities: 1. Rarely or not at all Pain Interference with Day-to-Day Activities: 1. Rarely or not at all Cognition Overall Cognitive Status: Within Functional Limits for tasks assessed Arousal/Alertness: Awake/alert Orientation Level: Oriented X4 Memory: Appears intact Awareness: Appears intact Problem Solving: Appears intact Safety/Judgment: Appears intact Sensation Sensation Light Touch: Appears Intact Hot/Cold: Appears Intact Proprioception: Appears Intact Stereognosis: Not tested Additional Comments: decreased sensation along R medial malleoli due to graft - improved since eval Coordination Gross Motor Movements are Fluid and Coordinated: Yes Fine Motor Movements are Fluid and Coordinated: Yes Finger Nose Finger Test: [facility] bilaterally Heel Shin Test: slow and decreased ROM Motor  Motor Motor: Within Functional Limits  Mobility Bed Mobility Bed Mobility: Rolling Right;Rolling Left;Sit to Supine;Supine to Sit Rolling Right: Independent with assistive device Rolling Left: Independent with assistive device Supine to Sit: Independent with assistive  device Sit to Supine: Independent with assistive device Transfers Transfers: Sit to Stand;Stand to Sit;Stand Pivot Transfers Sit to Stand: Independent with assistive device Stand to Sit: Independent with assistive device Stand Pivot Transfers: Independent with assistive  device Transfer (Assistive device): Rollator Locomotion  Gait Ambulation: Yes Gait Assistance: Independent with assistive device Gait Distance (Feet): 500 Feet Assistive device: Rollator Gait Gait: Yes Gait Pattern: Impaired Gait Pattern: Poor foot clearance - left;Poor foot clearance - right Gait velocity: slightly decreased Stairs / Additional Locomotion Stairs: Yes Stairs Assistance: Independent with assistive device Stair Management Technique: Two rails;Alternating pattern;Forwards Number of Stairs: 12 Height of Stairs: 6 Ramp: Independent with assistive device (rollator) Wheelchair Mobility Wheelchair Mobility: No  Trunk/Postural Assessment  Cervical Assessment Cervical Assessment: Within Functional Limits Thoracic Assessment Thoracic Assessment: Exceptions to [facility] (old thoracic injury) Lumbar Assessment Lumbar Assessment: Within Functional Limits Postural Control Postural Control: Within Functional Limits  Balance Balance Balance Assessed: Yes Standardized Balance Assessment Standardized Balance Assessment: Timed Up and Go Test Timed Up and Go Test TUG: Normal TUG Normal TUG (seconds): 18.3 Static Sitting Balance Static Sitting - Balance Support: No upper extremity supported;Feet supported Static Sitting - Level of Assistance: 7: Independent Dynamic Sitting Balance Dynamic Sitting - Balance Support: Feet supported;No upper extremity supported Dynamic Sitting - Level of Assistance: 7: Independent Static Standing Balance Static Standing - Balance Support: Bilateral upper extremity supported;During functional activity (rollator) Static Standing - Level of Assistance: 6: Modified independent  (Device/Increase time) Dynamic Standing Balance Dynamic Standing - Balance Support: During functional activity;Bilateral upper extremity supported (rollator) Dynamic Standing - Level of Assistance: 6: Modified independent (Device/Increase time) Dynamic Standing - Comments: with transfers and gait Extremity Assessment  RLE Assessment RLE Assessment: Exceptions to [facility] General Strength Comments: tested sitting in recliner RLE Strength Right Hip Flexion: 4-/5 Right Hip ABduction: 4/5 Right Hip ADduction: 4-/5 Right Knee Flexion: 4-/5 Right Knee Extension: 4-/5 Right Ankle Dorsiflexion: 4+/5 Right Ankle Plantar Flexion: 4+/5 LLE Assessment LLE Assessment: Exceptions to [facility] General Strength Comments: tested sitting in recliner LLE Strength Left Hip Flexion: 4-/5 Left Hip ABduction: 4/5 Left Hip ADduction: 4/5 Left Knee Flexion: 4/5 Left Knee Extension: 4/5 Left Ankle Dorsiflexion: 4+/5 Left Ankle Plantar Flexion: 4+/5   Marlana Salvage Zaunegger Blima Rich PT, DPT 03/20/2024, 7:00 AM

## 2024-03-20 NOTE — Progress Notes (Addendum)
 PROGRESS NOTE   Subjective/Complaints: BP improved Stable for d/c tomorrow. Friend will be picking him up at 10am, discussed with PA No issues overnight  ROS: Patient denies fever, new vision changes, dizziness, nausea, vomiting, diarrhea,  shortness of breath or chest pain, headache, or mood change. + Constipation-improved Chest wall soreness improving +orthostasis- improved   Objective:   No results found. Recent Labs    03/18/24 0541  WBC 13.2*  HGB 11.5*  HCT 34.3*  PLT 600*   Recent Labs    03/18/24 0541  NA 135  K 4.3  CL 104  CO2 23  GLUCOSE 111*  BUN 13  CREATININE 1.25*  CALCIUM 9.0     Intake/Output Summary (Last 24 hours) at 03/20/2024 0951 Last data filed at 03/20/2024 0745 Gross per 24 hour  Intake 1196 ml  Output 1300 ml  Net -104 ml        Physical Exam: Vital Signs Blood pressure 129/78, pulse (!) 104, temperature 97.8 F (36.6 C), temperature source Oral, resp. rate 19, height 6\' 3"  (1.905 m), weight 96.9 kg, SpO2 99%.  General: No apparent distress, appears comfortable laying in bed HEENT: Head is normocephalic, atraumatic,  oral mucosa pink and moist, decreased dentition  Neck: Supple without JVD or lymphadenopathy Heart: Tachycardia Chest: CTA bilaterally without wheezes, rales, or rhonchi; no distress Abdomen: Soft, non-tender, mildly-distended, bowel sounds normoactive Extremities: No clubbing, cyanosis, or edema. Pulses are 2+ Psych: Pt's affect is appropriate. Pt is cooperative Skin: Sternal incision CDI, right leg graft site appears to be healing well   Neuro:     Mental Status: AAOx4, no apparent cognitive deficits, cranial nerves II through XII grossly intact   MOTOR: RUE: At least 4 out of 5 limited by sternal precautions  LUE: At least 4 out of 5 limited by sternal precautions RLE: HF 5/5, KE 5/5, ADF 5/5, APF 5/5 LLE: HF 5/5, KE 5/5, ADF 5/5, APF  5/5   SENSORY: Normal to touch all 4 extremities, altered on the left side of his surgical incision, stable 4/7      Assessment/Plan: 1. Functional deficits which require 3+ hours per day of interdisciplinary therapy in a comprehensive inpatient rehab setting. Physiatrist is providing close team supervision and 24 hour management of active medical problems listed below. Physiatrist and rehab team continue to assess barriers to discharge/monitor patient progress toward functional and medical goals  Care Tool:  Bathing    Body parts bathed by patient: Right arm, Left arm, Chest, Abdomen, Front perineal area, Right upper leg, Left upper leg, Face, Buttocks, Right lower leg, Left lower leg   Body parts bathed by helper: Buttocks, Left lower leg, Right lower leg     Bathing assist Assist Level: Independent with assistive device Assistive Device Comment: seated on TTB   Upper Body Dressing/Undressing Upper body dressing   What is the patient wearing?: Pull over shirt    Upper body assist Assist Level: Independent with assistive device    Lower Body Dressing/Undressing Lower body dressing      What is the patient wearing?: Pants, Underwear/pull up     Lower body assist Assist for lower body dressing: Independent with assitive  device     Toileting Toileting Toileting Activity did not occur (Acupuncturist and hygiene only): N/A (no void or bm)  Toileting assist Assist for toileting: Independent with assistive device     Transfers Chair/bed transfer  Transfers assist     Chair/bed transfer assist level: Independent with assistive device Chair/bed transfer assistive device: Other (rollator)   Locomotion Ambulation   Ambulation assist   Ambulation activity did not occur:  (dizziness, lightheadedness)  Assist level: Independent with assistive device Assistive device: Rollator Max distance: >529ft   Walk 10 feet activity   Assist  Walk 10 feet activity  did not occur:  (dizziness, lightheadedness)  Assist level: Independent with assistive device Assistive device: Rollator   Walk 50 feet activity   Assist Walk 50 feet with 2 turns activity did not occur:  (dizziness, lightheadedness)  Assist level: Independent with assistive device Assistive device: Rollator    Walk 150 feet activity   Assist Walk 150 feet activity did not occur:  (dizziness, lightheadedness)  Assist level: Independent with assistive device Assistive device: Rollator    Walk 10 feet on uneven surface  activity   Assist Walk 10 feet on uneven surfaces activity did not occur:  (dizziness, lightheadedness)   Assist level: Independent with assistive device Assistive device: Rollator   Wheelchair     Assist Is the patient using a wheelchair?: No Type of Wheelchair: Manual Wheelchair activity did not occur: N/A  Wheelchair assist level: Supervision/Verbal cueing Max wheelchair distance: 154ft    Wheelchair 50 feet with 2 turns activity    Assist    Wheelchair 50 feet with 2 turns activity did not occur: N/A   Assist Level: Supervision/Verbal cueing   Wheelchair 150 feet activity     Assist  Wheelchair 150 feet activity did not occur: N/A   Assist Level: Supervision/Verbal cueing   Blood pressure 129/78, pulse (!) 104, temperature 97.8 F (36.6 C), temperature source Oral, resp. rate 19, height 6\' 3"  (1.905 m), weight 96.9 kg, SpO2 99%.  Medical Problem List and Plan: 1. 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  Atrial fibrillation: continue Eliquis             -antiplatelet therapy: ASA  3. Pain: continue Oxycodone prn for severe pain.              -4/2  history of substance abuse years ago  4. Mood/Behavior/Sleep: LCSW to follow for evaluation and support.              -antipsychotic agents: N/A  5. Neuropsych/cognition: This patient is capable of making decisions on his own behalf.  6. Skin/Wound Care: Monitor wound for healing.   7. Fluids/Electrolytes/Nutrition:  Monitor I/O.   8. A fib: amiodarone decreased by cardiology to 200mg  daily     03/20/2024    8:59 AM 03/20/2024    5:00 AM 03/20/2024    4:21 AM  Vitals with BMI  Weight  213 lbs 10 oz   BMI  26.7   Systolic 129  116  Diastolic 78  71  Pulse 104  69    9. Orthostatic hypotension: Encourage fluid intake. Midodrine added on 04/02.  TED hose started  - 4/4 reports improved, denies recent symptoms  - 4/5 decrease amiodarone and metoprolol as above  - 4/6 patient denies any further symptoms of orthostatic hypotension 10. Urinary retention: On Flomax and reports voiding without difficulty.  voiding --Foley discontinued on 03/30. Educated on need for PVRs 1-2 days to monitor function.    --has had rise in SCr from 0.9-->1.26, recheck labs tomorrow -4/3 PVR 0 and 87, patient refusing bladder scans.  Appears overall stable will discontinue.  Creatinine improved to 1.14 -4/5 remains continent PVR 98 and 72 11. Acute blood loss anemia/Leukocytosis:   - 4/5 WBC a little higher at 16.7, hemoglobin stable 11.7.  No signs of infection noted continue to monitor 12. Constipation: resolved, d/c sorbitol  13.  Tobacco abuse: "Smoked his last cigarette on way to surgery"  --continue nicotine patch.  -Tobacco cessation counseling  14. Impaired fasting glucose: Hgb A1C- 5.8. continue CM diet.    -CBG stable  15. Hypotension: d/c flomax, d/c metoprolol, continue midodrine  16. Bradycardia: d/c metoprolol, resolved  17. Tachycardia: continue amiodarone   LOS: 7 days A FACE TO FACE EVALUATION WAS PERFORMED  Drema Pry Alphonsine Minium 03/20/2024, 9:51 AM

## 2024-03-20 NOTE — Progress Notes (Signed)
 Patient ID: Dave Williams, male   DOB: [birth year], 68 y.o.   MRN: [id number] Met with pt to give him number for [employer] regarding his co-pays. He reports his cards are at home and he will not pay prior to leaving. Informed he will not get his equipment then prior to going home. He is aware.

## 2024-03-20 NOTE — Progress Notes (Signed)
 Occupational Therapy Session Note  Patient Details  Name: Dave Williams MRN: [id number] Date of Birth: [birth year]  Today's Date: 03/20/2024 OT Individual Time: 4098-1191 & 1340-1440 OT Individual Time Calculation (min): 68 min & 60 min  OT missed time: 7 min Missed time reason: fatigue; EKG   Short Term Goals: Week 1:  OT Short Term Goal 1 (Week 1): Patient will transfer to toilet with close supervision following set up OT Short Term Goal 2 (Week 1): Patient will don socks with min assist OT Short Term Goal 3 (Week 1): STG=LTG due to short LOS  Skilled Therapeutic Interventions/Progress Updates:  Session 1 Skilled OT intervention completed with focus on activity/standing tolerance, dynamic standing balance, ambulatory endurance. Pt received seated in recliner, agreeable to session. No pain reported.  Pt requested to shower in PM session. Discussed his decision to purchase BSC vs TTB OOP due to copay being expensive and I advised that he avoid entering shower until more steady and to use shower chair for energy conservation as he progresses. Pt in agreement. Completed all sit > stands and ambulatory transfers with rollator and mod I.  Ambulated > gym. Pt stood on airex pad with supervision, while playing Jenga against OT to address dynamic balance and standing tolerance. No LOB and pt able to maintain for several mins for length of game.  Seated rest, then while holding 2 cones with small 1 lb ball on each cone, pt ambulated 150 ft to promote higher level balance strategies without reliance on AD.   Following extended rest, pt verbalized feeling tired, which progressed into symptoms of feeling unwell. Vitals obtained- BP 126/68, HR 138 bpm, O2 sat 100% on RA. Pulse checked manually due to A-fib with HR noted to be 125 bpm at rest. Nurse and MD/PA notified with request to return pt to room for EKG. Pt ambulated back to room with rollator, transitioned sit > supine mod I in bed. Pt remained  semi supine in bed, with all needs in reach at end of session. Pt missed 7 mins of OT intervention secondary to fatigue and EKG; OT will make up missed time as able.   Session 2 Skilled OT intervention completed with focus on ADL retraining, functional endurance, and mobility within a shower context. Pt received supine in bed, agreeable to session. No pain reported. Pt cleared for therapy due to normal EKG results. Pt agreeable to shower.  Pt completed all sit > stands and ambulatory transfers with mod I while using rollator. Good awareness of sternal precautions and safety.  Pt with minimal remaining surgical glue on incisions, therefore left uncovered but instructed pt to avoid scrubbing the area. Pt was able to bathe all parts with mod I, at the sit > stand level while using grab bar for balance. Able to donn shirt/deo independently. Threaded LB clothing with mod I. Mod I for shaving face while seated on rollator at sink. Donned socks/shoes independently using figure 4 position though anticipated difficulty and surprised self.  Pt ambulated about 500 ft to trial functional mobility with tennis shoes on, without LOB or difficulty.   Pt remained seated in rollator with mod I clearance in room, with all needs in reach at end of session.  Vitals BP 117/72 (sitting EOB at rest), HR 81 bpm BP 104/82 (sitting after shower/activity), HR 138 bpm initially, however 110 bpm taken manually    Therapy Documentation Precautions:  Precautions Precautions: Fall, Sternal Precaution Booklet Issued: No Precaution/Restrictions Comments: monitor HR and BP Restrictions Weight  Bearing Restrictions Per Provider Order: No Other Position/Activity Restrictions: sternal precautions    Therapy/Group: Individual Therapy  Melvyn Novas, MS, OTR/L  03/20/2024, 2:41 PM

## 2024-03-20 NOTE — Progress Notes (Signed)
 Inpatient Rehabilitation Discharge Medication Review by a Pharmacist  A complete drug regimen review was completed for this patient to identify any potential clinically significant medication issues.  High Risk Drug Classes Is patient taking? Indication by Medication  Antipsychotic No   Anticoagulant Yes Apixaban- AF  Antibiotic No   Opioid Yes Norco- acute pain  Antiplatelet Yes Aspirin- cva ppx  Hypoglycemics/insulin No   Vasoactive Medication Yes Amiodarone- rate control Proamatine- orthostasis NitroSL- angina  Chemotherapy No   Other Yes Lipitor- HLD Melatonin- sleep Protonix- GERD     Type of Medication Issue Identified Description of Issue Recommendation(s)  Drug Interaction(s) (clinically significant)     Duplicate Therapy     Allergy     No Medication Administration End Date     Incorrect Dose     Additional Drug Therapy Needed     Significant med changes from prior encounter (inform family/care partners about these prior to discharge). Metoprolol stopped 2/2 bradycardia Counsel patient on discontinuing metoprolol at discharge  Other       Clinically significant medication issues were identified that warrant physician communication and completion of prescribed/recommended actions by midnight of the next day:  No   Time spent performing this drug regimen review (minutes):  30   Marchele Decock BS, PharmD, BCPS Clinical Pharmacist 03/20/2024 1:06 PM  Contact: [phone] after 3 PM  "Be curious, not judgmental..." -Debbora Dus

## 2024-03-21 ENCOUNTER — Other Ambulatory Visit (HOSPITAL_COMMUNITY): Payer: Self-pay

## 2024-03-21 DIAGNOSIS — D72829 Elevated white blood cell count, unspecified: Secondary | ICD-10-CM | POA: Insufficient documentation

## 2024-03-21 DIAGNOSIS — D62 Acute posthemorrhagic anemia: Secondary | ICD-10-CM | POA: Insufficient documentation

## 2024-03-21 DIAGNOSIS — R339 Retention of urine, unspecified: Secondary | ICD-10-CM | POA: Insufficient documentation

## 2024-03-21 LAB — BASIC METABOLIC PANEL WITH GFR
Anion gap: 10 (ref 5–15)
BUN: 14 mg/dL (ref 8–23)
CO2: 23 mmol/L (ref 22–32)
Calcium: 9.2 mg/dL (ref 8.9–10.3)
Chloride: 102 mmol/L (ref 98–111)
Creatinine, Ser: 1.38 mg/dL — ABNORMAL HIGH (ref 0.61–1.24)
GFR, Estimated: 56 mL/min — ABNORMAL LOW (ref >60)
Glucose, Bld: 106 mg/dL — ABNORMAL HIGH (ref 70–99)
Potassium: 4.2 mmol/L (ref 3.5–5.1)
Sodium: 135 mmol/L (ref 135–145)

## 2024-03-21 LAB — CBC WITH DIFFERENTIAL/PLATELET
Abs Immature Granulocytes: 0.13 10*3/uL — ABNORMAL HIGH (ref 0.00–0.07)
Basophils Absolute: 0.1 10*3/uL (ref 0.0–0.1)
Basophils Relative: 1 %
Eosinophils Absolute: 0.3 10*3/uL (ref 0.0–0.5)
Eosinophils Relative: 3 %
HCT: 36.1 % — ABNORMAL LOW (ref 39.0–52.0)
Hemoglobin: 12.1 g/dL — ABNORMAL LOW (ref 13.0–17.0)
Immature Granulocytes: 1 %
Lymphocytes Relative: 23 %
Lymphs Abs: 2.7 10*3/uL (ref 0.7–4.0)
MCH: 30.3 pg (ref 26.0–34.0)
MCHC: 33.5 g/dL (ref 30.0–36.0)
MCV: 90.5 fL (ref 80.0–100.0)
Monocytes Absolute: 1.3 10*3/uL — ABNORMAL HIGH (ref 0.1–1.0)
Monocytes Relative: 11 %
Neutro Abs: 7.2 10*3/uL (ref 1.7–7.7)
Neutrophils Relative %: 61 %
Platelets: 600 10*3/uL — ABNORMAL HIGH (ref 150–400)
RBC: 3.99 MIL/uL — ABNORMAL LOW (ref 4.22–5.81)
RDW: 13.1 % (ref 11.5–15.5)
WBC: 11.6 10*3/uL — ABNORMAL HIGH (ref 4.0–10.5)
nRBC: 0 % (ref 0.0–0.2)

## 2024-03-21 NOTE — Plan of Care (Signed)
  Problem: Consults Goal: RH GENERAL PATIENT EDUCATION Description: See Patient Education module for education specifics. Outcome: Progressing   Problem: RH BOWEL ELIMINATION Goal: RH STG MANAGE BOWEL WITH ASSISTANCE Description: STG Manage Bowel with  Mod I Assistance. Outcome: Progressing   Problem: RH BLADDER ELIMINATION Goal: RH STG MANAGE BLADDER WITH ASSISTANCE Description: STG Manage Bladder With  Mod I Assistance Outcome: Progressing   Problem: RH SKIN INTEGRITY Goal: RH STG SKIN FREE OF INFECTION/BREAKDOWN Description: Manage skin free of infection/breakdown with mod I assistance Outcome: Progressing   Problem: RH SAFETY Goal: RH STG ADHERE TO SAFETY PRECAUTIONS W/ASSISTANCE/DEVICE Description:  Adhere to Safety Precautions With  mod I Assistance/Device. Outcome: Progressing   Problem: RH PAIN MANAGEMENT Goal: RH STG PAIN MANAGED AT OR BELOW PT'S PAIN GOAL Description: <4 w/ prns Outcome: Progressing   Problem: RH KNOWLEDGE DEFICIT GENERAL Goal: RH STG INCREASE KNOWLEDGE OF SELF CARE AFTER HOSPITALIZATION Description: Manage increase knowledge of self care after hospitalization deficit  with mod I assistance using educational materials provided Outcome: Progressing

## 2024-03-21 NOTE — Progress Notes (Signed)
 Reached out to Dr. Duke Salvia who evaluated EKG and meds-->no changes and to follow up with cards in near future. (has appt next week)

## 2024-03-21 NOTE — Progress Notes (Signed)
 Inpatient Rehabilitation Care Coordinator Discharge Note   Patient Details  Name: Nicklos Gaxiola MRN: [id number] Date of Birth: 07/07/1956   Discharge location: HOME ALONE WITH NEIGHBORS AND FREINDS TO CHECK ON  Length of Stay: 8 DAYS  Discharge activity level: MOD/I LEVEL  Home/community participation: ACTIVE  Patient response ZH:YQMVHQ Literacy - How often do you need to have someone help you when you read instructions, pamphlets, or other written material from your doctor or pharmacy?: Never  Patient response IO:NGEXBM Isolation - How often do you feel lonely or isolated from those around you?: Never  Services provided included: MD, RD, PT, OT, RN, CM, Pharmacy, Neuropsych, SW  Financial Services:  Field seismologist Utilized: Private Insurance [employer] MEDICARE  Choices offered to/list presented to: PT  Follow-up services arranged:  Other (Comment) (CARDIAC REHAB)     ORDERED ROLLATOR AND 3 IN 1 VIA [facility] AWARE IF WANTS TUB BENCH WILL GET ON OWN SINCE NOT COVERED      Patient response to transportation need: Is the patient able to respond to transportation needs?: Yes In the past 12 months, has lack of transportation kept you from medical appointments or from getting medications?: No In the past 12 months, has lack of transportation kept you from meetings, work, or from getting things needed for daily living?: No   Patient/Family verbalized understanding of follow-up arrangements:  Yes  Individual responsible for coordination of the follow-up plan: SELF [phone]  Confirmed correct DME delivered: Lucy Chris 03/21/2024    Comments (or additional information):PT DID WELL AND REACHED MOD/I GOALS READY FOR DC  Summary of Stay    Date/Time Discharge Planning CSW  03/20/24 1005 Home alone with neighbors and friends checking in on him. Awar eof co-pays for equipment and may wait until gets home to get due to no money/cards here RGD       Naiyah Klostermann, Lemar Livings

## 2024-03-21 NOTE — Progress Notes (Signed)
 PROGRESS NOTE   Subjective/Complaints: EKG reviewed and is stable Patient feels well this morning and has no questions or complaints Ready to go home  ROS: Patient denies fever, new vision changes, dizziness, nausea, vomiting, diarrhea,  shortness of breath or chest pain, headache, or mood change. + Constipation-improved Chest wall soreness improving +orthostasis- improved   Objective:   No results found. Recent Labs    03/21/24 0530  WBC 11.6*  HGB 12.1*  HCT 36.1*  PLT 600*   Recent Labs    03/21/24 0530  NA 135  K 4.2  CL 102  CO2 23  GLUCOSE 106*  BUN 14  CREATININE 1.38*  CALCIUM 9.2     Intake/Output Summary (Last 24 hours) at 03/21/2024 4098 Last data filed at 03/21/2024 0700 Gross per 24 hour  Intake 747 ml  Output 240 ml  Net 507 ml        Physical Exam: Vital Signs Blood pressure 122/88, pulse 80, temperature 98 F (36.7 C), temperature source Oral, resp. rate 18, height 6\' 3"  (1.905 m), weight 94.2 kg, SpO2 99%.  General: No apparent distress, appears comfortable laying in bed HEENT: Head is normocephalic, atraumatic,  oral mucosa pink and moist, decreased dentition  Neck: Supple without JVD or lymphadenopathy Heart: Tachycardia Chest: CTA bilaterally without wheezes, rales, or rhonchi; no distress Abdomen: Soft, non-tender, mildly-distended, bowel sounds normoactive Extremities: No clubbing, cyanosis, or edema. Pulses are 2+ Psych: Pt's affect is appropriate. Pt is cooperative Skin: Sternal incision CDI, right leg graft site appears to be healing well   Neuro:     Mental Status: AAOx4, no apparent cognitive deficits, cranial nerves II through XII grossly intact   MOTOR: RUE: At least 4 out of 5 limited by sternal precautions  LUE: At least 4 out of 5 limited by sternal precautions RLE: HF 5/5, KE 5/5, ADF 5/5, APF 5/5 LLE: HF 5/5, KE 5/5, ADF 5/5, APF 5/5 SENSORY: Normal to  touch all 4 extremities, altered on the left side of his surgical incision, stable 4/10      Assessment/Plan: 1. Functional deficits which require 3+ hours per day of interdisciplinary therapy in a comprehensive inpatient rehab setting. Physiatrist is providing close team supervision and 24 hour management of active medical problems listed below. Physiatrist and rehab team continue to assess barriers to discharge/monitor patient progress toward functional and medical goals  Care Tool:  Bathing    Body parts bathed by patient: Right arm, Left arm, Chest, Abdomen, Front perineal area, Right upper leg, Left upper leg, Face, Buttocks, Right lower leg, Left lower leg   Body parts bathed by helper: Buttocks, Left lower leg, Right lower leg     Bathing assist Assist Level: Independent with assistive device Assistive Device Comment: seated on TTB   Upper Body Dressing/Undressing Upper body dressing   What is the patient wearing?: Pull over shirt    Upper body assist Assist Level: Independent with assistive device    Lower Body Dressing/Undressing Lower body dressing      What is the patient wearing?: Pants, Underwear/pull up     Lower body assist Assist for lower body dressing: Independent with assitive device  Toileting Toileting Toileting Activity did not occur Press photographer and hygiene only): N/A (no void or bm)  Toileting assist Assist for toileting: Independent with assistive device     Transfers Chair/bed transfer  Transfers assist     Chair/bed transfer assist level: Independent with assistive device Chair/bed transfer assistive device: Other (rollator)   Locomotion Ambulation   Ambulation assist   Ambulation activity did not occur:  (dizziness, lightheadedness)  Assist level: Independent with assistive device Assistive device: Rollator Max distance: >553ft   Walk 10 feet activity   Assist  Walk 10 feet activity did not occur:  (dizziness,  lightheadedness)  Assist level: Independent with assistive device Assistive device: Rollator   Walk 50 feet activity   Assist Walk 50 feet with 2 turns activity did not occur:  (dizziness, lightheadedness)  Assist level: Independent with assistive device Assistive device: Rollator    Walk 150 feet activity   Assist Walk 150 feet activity did not occur:  (dizziness, lightheadedness)  Assist level: Independent with assistive device Assistive device: Rollator    Walk 10 feet on uneven surface  activity   Assist Walk 10 feet on uneven surfaces activity did not occur:  (dizziness, lightheadedness)   Assist level: Independent with assistive device Assistive device: Rollator   Wheelchair     Assist Is the patient using a wheelchair?: No Type of Wheelchair: Manual Wheelchair activity did not occur: N/A  Wheelchair assist level: Supervision/Verbal cueing Max wheelchair distance: 115ft    Wheelchair 50 feet with 2 turns activity    Assist    Wheelchair 50 feet with 2 turns activity did not occur: N/A   Assist Level: Supervision/Verbal cueing   Wheelchair 150 feet activity     Assist  Wheelchair 150 feet activity did not occur: N/A   Assist Level: Supervision/Verbal cueing   Blood pressure 122/88, pulse 80, temperature 98 F (36.7 C), temperature source Oral, resp. rate 18, height 6\' 3"  (1.905 m), weight 94.2 kg, SpO2 99%.  Medical Problem List and Plan: 1. 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  Atrial fibrillation: continue Eliquis             -antiplatelet therapy: ASA  3. Pain: continue Oxycodone prn for severe pain.              -4/2 history of substance abuse years  ago  4. Mood/Behavior/Sleep: LCSW to follow for evaluation and support.              -antipsychotic agents: N/A  5. Neuropsych/cognition: This patient is capable of making decisions on his own behalf.  6. Skin/Wound Care: Monitor wound for healing.   7. Fluids/Electrolytes/Nutrition:  Monitor I/O.   8. A fib: amiodarone decreased by cardiology to 200mg  daily     03/21/2024    8:33 AM 03/21/2024    5:00 AM 03/21/2024    4:00 AM  Vitals with BMI  Weight  207 lbs 11 oz   BMI  25.96   Systolic 122  107  Diastolic 88  76  Pulse 80  74  9. Orthostatic hypotension: Encourage fluid intake. Midodrine added on 04/02.  TED hose started  - 4/4 reports improved, denies recent symptoms  - 4/5 decrease amiodarone and metoprolol as above  - 4/6 patient denies any further symptoms of orthostatic hypotension 10. Urinary retention: On Flomax and reports voiding without difficulty.  voiding --Foley discontinued on 03/30. Educated on need for PVRs 1-2 days to monitor function.    --has had rise in SCr from 0.9-->1.26, recheck labs tomorrow -4/3 PVR 0 and 87, patient refusing bladder scans.  Appears overall stable will discontinue.  Creatinine improved to 1.14 -4/5 remains continent PVR 98 and 72 11. Acute blood loss anemia/Leukocytosis:   - 4/5 WBC a little higher at 16.7, hemoglobin stable 11.7.  No signs of infection noted continue to monitor 12. Constipation: resolved, d/c sorbitol  13.  Tobacco abuse: "Smoked his last cigarette on way to surgery"  --continue nicotine patch.  -Tobacco cessation counseling  14. Impaired fasting glucose: Hgb A1C- 5.8. continue CM diet.    -CBG stable  15. Hypotension: d/c flomax, d/c metoprolol, continue midodrine, resolved  16. Bradycardia: d/c metoprolol, resolved, EKG reviewed and is stable  17. Tachycardia: continue amiodarone, EKG reviewed and is stable   >30 minutes spent in discharge of patient including review of medications and follow-up  appointments, physical examination, and in answering all patient's questions   LOS: 8 days A FACE TO FACE EVALUATION WAS PERFORMED  Shamiyah Ngu P Tyrese Ficek 03/21/2024, 9:22 AM

## 2024-03-22 ENCOUNTER — Telehealth: Payer: Self-pay | Admitting: Thoracic Surgery (Cardiothoracic Vascular Surgery)

## 2024-03-25 ENCOUNTER — Telehealth: Payer: Self-pay | Admitting: Cardiovascular Disease

## 2024-03-25 ENCOUNTER — Telehealth: Payer: Self-pay | Admitting: Orthopaedic Surgery

## 2024-03-25 NOTE — Telephone Encounter (Signed)
 Pt requesting provider switch due to location

## 2024-03-26 ENCOUNTER — Ambulatory Visit: Admitting: Cardiology

## 2024-03-28 ENCOUNTER — Telehealth: Payer: Self-pay | Admitting: Physician Assistant

## 2024-03-28 NOTE — Telephone Encounter (Signed)
 Makes sense from Dr. Katheryne Pane standpoint for the patient to be seen close to home although perhaps every other than Dr. Londa Rival would be a better option for long-term cardiologist.  I agree with his recommendations.  Perhaps Dr. Amanda Jungling or Dr. Eduardo Grade, MD\

## 2024-03-28 NOTE — Telephone Encounter (Signed)
      [address]       [place] [address]             [phone]      [name] called our office asking if he could drive to church on Sunday for Easter. I called the patient and he reports he is feeling better than he has in a long time. He denies chest pain, shortness of breath and dizzy spells/orthostasis. He has been ambulating well around his house and cooking meals. He will be 4 weeks postop on 04/24. I cleared him to drive to [facility] on 04/23 as long as he is not taking any narcotic pain medication during the day. I also told him to take it slow and make sure he had someone he could call if he did not feel comfortable. He has a telephone visit with Dr. Deloise Ferries on 04/24.    Randa Burton, PA-C

## 2024-04-01 ENCOUNTER — Ambulatory Visit (INDEPENDENT_AMBULATORY_CARE_PROVIDER_SITE_OTHER): Payer: Self-pay | Admitting: Thoracic Surgery (Cardiothoracic Vascular Surgery)

## 2024-04-01 ENCOUNTER — Telehealth: Payer: Self-pay | Admitting: Cardiovascular Disease

## 2024-04-01 DIAGNOSIS — Z951 Presence of aortocoronary bypass graft: Secondary | ICD-10-CM

## 2024-04-01 MED ORDER — ISOSORBIDE MONONITRATE ER 30 MG PO TB24
30.0000 mg | ORAL_TABLET | Freq: Every day | ORAL | 3 refills | Status: DC
Start: 2024-04-01 — End: 2024-04-10

## 2024-04-01 MED ORDER — ATORVASTATIN CALCIUM 80 MG PO TABS: 80.0000 mg | ORAL_TABLET | Freq: Every day | ORAL | 3 refills | Status: DC

## 2024-04-01 NOTE — Progress Notes (Addendum)
     [address]       [name] [address]             [phone]       Patient: Home Provider: Office Consent for Telemedicine visit obtained.  Today's visit was completed via a real-time telehealth (see specific modality noted below). The patient/authorized person provided oral consent at the time of the visit to engage in a telemedicine encounter with the present provider at [facility]. The patient/authorized person was informed of the potential benefits, limitations, and risks of telemedicine. The patient/authorized person expressed understanding that the laws that protect confidentiality also apply to telemedicine. The patient/authorized person acknowledged understanding that telemedicine does not provide emergency services and that he or she would need to call 911 or proceed to the nearest hospital for help if such a need arose.   Total time spent in the clinical discussion 10 minutes.  Telehealth Modality: Phone visit (audio only)  I had a telephone visit with  Dave Williams who is s/p CABG.  He has had some chest pain over the weekend, and has been taking SL NG.  He thinks that it is related to his high blood pressure.  I have put him back on imdur  to assist with blood pressure and chest pain.  I also advised him to stop taking the midodrine .  Otherwise, he is doing well.  Pain is minimal.  Ambulating well. Vitals have been stable.  Dave Williams will see us  back in 1 month with a chest x-ray for cardiac rehab clearance.  [name]

## 2024-04-01 NOTE — Addendum Note (Signed)
 Addended by: Ivalene Platte O on: 04/01/2024 03:17 PM   Modules accepted: Orders

## 2024-04-01 NOTE — Telephone Encounter (Signed)
 Called and spoke to pt regarding the new Chest Pain he is experiencing.  The CP started last night and he has had to take 6 SL NTG in the last 12 hours.  CP goes to jaw, then to shoulder; BP = 167/107 (prior to SL NTG) and then BP drops to 110/70 after one SL NTG.  CP is similar to what he had experienced prior to CABG, but not as intense.  He is a Investment banker, operational.  Pt also called [phone] (Dr. Raina Bunting office); they advised him to wait to speak to MD today at 3:00 pm Telephone office visit appointment.  Pt denies any other associated symptoms. He is s/p CABG on 03/04/24, d/c from [facility] on 03/21/24.  Advised pt to call 911 if he experiences a more severe CP or has any additional symptoms (like SOB, dizziness/syncope/pre-syncope, tingling to extremities) , he verbalized understanding and states he will wait to speak to Dr. Deloise Ferries this afternoon. Other than the CP he is feeling good.

## 2024-04-01 NOTE — Telephone Encounter (Signed)
 Pt would like to do a provider switch from Dr Katheryne Pane to Dr Mallipeddi.  He likes Dr Katheryne Pane but it takes him a hour to get to our office in [place] and [place] is a lot closer.

## 2024-04-01 NOTE — Telephone Encounter (Signed)
   Pt c/o of Chest Pain: STAT if active CP, including tightness, pressure, jaw pain, radiating pain to shoulder/upper arm/back, CP unrelieved by Nitro. Symptoms reported of SOB, nausea, vomiting, sweating.  1. Are you having CP right now? no    2. Are you experiencing any other symptoms (ex. SOB, nausea, vomiting, sweating)? no   3. Is your CP continuous or coming and going? Coming and going   4. Have you taken Nitroglycerin ? yes   5. How long have you been experiencing CP? yesterday    6. If NO CP at time of call then end call with telling Pt to call back or call 911 if Chest pain returns prior to return call from triage team.

## 2024-04-03 ENCOUNTER — Other Ambulatory Visit: Payer: Self-pay

## 2024-04-03 DIAGNOSIS — Z951 Presence of aortocoronary bypass graft: Secondary | ICD-10-CM

## 2024-04-03 DIAGNOSIS — I25700 Atherosclerosis of coronary artery bypass graft(s), unspecified, with unstable angina pectoris: Secondary | ICD-10-CM

## 2024-04-04 ENCOUNTER — Telehealth: Payer: Self-pay | Admitting: Cardiovascular Disease

## 2024-04-04 NOTE — Telephone Encounter (Signed)
 Called and spoke w patient.  Pain since Monday all hours of day and night.  Same His BPs have been high and he's experiencing jaw pain, that radiates to shoulders and then chest.  He believes this is due to high blood pressure.  He had telehealth visit with Dr. Deloise Ferries on 4/21 who dc'd midodrine  and restarted his IMDUR  30 mg, which is what he took pre op.Aaron Aas   He made the medication change on Tuesday.  During the night he said about every 1 1/2 hours he woke up from the pain and took ntg.  He's not had any so far today.  He will monitor BPs and symptoms and will call back if symptoms continue or BP stays elevated.   Post op appointment with Dr. Katheryne Pane 04/22/24.

## 2024-04-04 NOTE — Telephone Encounter (Signed)
   Pt c/o of Chest Pain: STAT if active CP, including tightness, pressure, jaw pain, radiating pain to shoulder/upper arm/back, CP unrelieved by Nitro. Symptoms reported of SOB, nausea, vomiting, sweating.  1. Are you having CP right now? No     2. Are you experiencing any other symptoms (ex. SOB, nausea, vomiting, sweating)? No    3. Is your CP continuous or coming and going? Coming and going    4. Have you taken Nitroglycerin ? Yes    5. How long have you been experiencing CP? Since Monday   Pt states when his BP goes up (ranging around 160/105) he gets chest pain. Please advise.    6. If NO CP at time of call then end call with telling Pt to call back or call 911 if Chest pain returns prior to return call from triage team.

## 2024-04-04 NOTE — Telephone Encounter (Signed)
 [name], MD  [facility] Triage2 minutes ago (3:53 PM)    Should probably come in to see an APP   Patient identification verified by 2 forms. 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

## 2024-04-05 ENCOUNTER — Telehealth (HOSPITAL_COMMUNITY): Payer: Self-pay | Admitting: Licensed Clinical Social Worker

## 2024-04-05 ENCOUNTER — Telehealth: Payer: Self-pay | Admitting: Cardiovascular Disease

## 2024-04-05 NOTE — Telephone Encounter (Signed)
 CSW referred to assist patient with financial concerns regarding $40 co pays for cardiac rehab. CSW contacted patient to discuss options and inquire about his eligibility for medicaid. Patient states he had medicaid and needed to complete paperwork to re certify. He went to [facility] yesterday and thinks that he completed all that was needed and hope to hear soon. CSW suggested clarifying which medicaid program he is eligible for as that may assist with reducing medical co pays. Patient verbalizes understanding and will follow up with social services and cardiac rehab. Patient grateful for the information and support. Aubry Blase, LCSW, CCSW-MCS [phone]

## 2024-04-05 NOTE — Telephone Encounter (Signed)
 Patient reported that he did well yesterday, no symptoms until last evening and 1:00 am.  He is asking if he can increase IMDUR  30 to twice a day.  Today was his 3rd dose of the IMDUR .  I asked him to continue as is since it has only been two and half days of no midodrine .  He is in agreement and will discuss at visit next week with APP.  Amlodipine  5 mg - he was taking this prior to surgery and wonders if he should restart.   Again - I asked him to discuss at ov next week.

## 2024-04-05 NOTE — Telephone Encounter (Signed)
   Pt c/o of Chest Pain: STAT if active CP, including tightness, pressure, jaw pain, radiating pain to shoulder/upper arm/back, CP unrelieved by Nitro. Symptoms reported of SOB, nausea, vomiting, sweating.  1. Are you having CP right now? No     2. Are you experiencing any other symptoms (ex. SOB, nausea, vomiting, sweating)? No    3. Is your CP continuous or coming and going? Coming and going   4. Have you taken Nitroglycerin ? Yes    5. How long have you been experiencing CP? Overnight     6. If NO CP at time of call then end call with telling Pt to call back or call 911 if Chest pain returns prior to return call from triage team.

## 2024-04-10 ENCOUNTER — Ambulatory Visit: Attending: Emergency Medicine | Admitting: Emergency Medicine

## 2024-04-10 ENCOUNTER — Encounter: Payer: Self-pay | Admitting: Emergency Medicine

## 2024-04-10 ENCOUNTER — Telehealth (HOSPITAL_COMMUNITY): Payer: Self-pay

## 2024-04-10 VITALS — BP 128/86 | HR 72 | Ht 74.0 in | Wt 213.0 lb

## 2024-04-10 DIAGNOSIS — I10 Essential (primary) hypertension: Secondary | ICD-10-CM | POA: Diagnosis not present

## 2024-04-10 DIAGNOSIS — I25119 Atherosclerotic heart disease of native coronary artery with unspecified angina pectoris: Secondary | ICD-10-CM | POA: Diagnosis not present

## 2024-04-10 DIAGNOSIS — Z951 Presence of aortocoronary bypass graft: Secondary | ICD-10-CM | POA: Diagnosis not present

## 2024-04-10 DIAGNOSIS — R079 Chest pain, unspecified: Secondary | ICD-10-CM

## 2024-04-10 DIAGNOSIS — I484 Atypical atrial flutter: Secondary | ICD-10-CM

## 2024-04-10 DIAGNOSIS — I951 Orthostatic hypotension: Secondary | ICD-10-CM

## 2024-04-10 DIAGNOSIS — Z720 Tobacco use: Secondary | ICD-10-CM

## 2024-04-10 DIAGNOSIS — E785 Hyperlipidemia, unspecified: Secondary | ICD-10-CM

## 2024-04-10 MED ORDER — ATORVASTATIN CALCIUM 80 MG PO TABS: 80.0000 mg | ORAL_TABLET | Freq: Every day | ORAL | Status: DC

## 2024-04-10 MED ORDER — ISOSORBIDE MONONITRATE ER 30 MG PO TB24: 30.0000 mg | ORAL_TABLET | Freq: Every day | ORAL | 1 refills | Status: AC

## 2024-04-10 MED ORDER — NITROGLYCERIN 0.4 MG SL SUBL: 0.4000 mg | SUBLINGUAL_TABLET | SUBLINGUAL | 6 refills | Status: DC | PRN

## 2024-04-10 MED ORDER — APIXABAN 5 MG PO TABS: 5.0000 mg | ORAL_TABLET | Freq: Two times a day (BID) | ORAL | 0 refills | Status: DC

## 2024-04-10 MED ORDER — AMLODIPINE BESYLATE 10 MG PO TABS: 10.0000 mg | ORAL_TABLET | Freq: Every day | ORAL | 2 refills | Status: DC

## 2024-04-10 NOTE — Patient Instructions (Addendum)
 Medication Instructions:  START AMLODIPINE  10 MG AT BEDTIME.   Lab Work: NONE   Testing/Procedures: NONE  Follow-Up: At [facility], you and your health needs are our priority.  As part of our continuing mission to provide you with exceptional heart care, our providers are all part of one team.  This team includes your primary Cardiologist (physician) and Advanced Practice Providers or APPs (Physician Assistants and Nurse Practitioners) who all work together to provide you with the care you need, when you need it.  Your next appointment:   KEEP Apr 22, 2024 APPOINTMENT WITH DR. Katheryne Pane  Provider:   Lauro Portal, MD

## 2024-04-10 NOTE — Progress Notes (Signed)
 Cardiology Office Note:    Date:  04/10/2024  ID:  Dave Williams, DOB 10-24-1956, MRN [id number] PCP: Caresse Chant, FNP  [facility] Providers Cardiologist:  Lauro Portal, MD       Patient Profile:      Chief Complaint: Hospital follow-up post CABG History of Present Illness:  Dave Williams is a 68 y.o. male with visit-pertinent history of tobacco abuse, hypertension, hyperlipidemia, TIA, substance abuse in the past,  Patient established with cardiology service on 11/27/2023 with Dr. Katheryne Pane.  He was referred for chest pain by the emergency room where he was seen on 11/04/2023.  He underwent cardiac catheterization on 11/27/2023 showing three-vessel obstructive CAD.  Given his multivessel disease would recommend consideration for CABG.  Toprol -XL 25 mg daily and Imdur  30 mg daily was added to his medication regimen.  Echocardiogram performed showing LVEF 65 to 70%, no RWMA, grade 1 DD.  He followed up with Dr. Deloise Ferries of cardiothoracic surgery on 12/22/2023.  He was agreeable to proceed with CABG.  Patient underwent surgery on 02/29/2024 with bypass grafting x 3.  Postoperatively patient had intermittent episodes of SVT with rates in the 150s.  His SVT progressed to atypical atrial flutter.  EKG consistent with atypical atrial flutter 2:1 conduction.  He was initially treated with amiodarone  bolus, had positive response and was transition to p.o. 200 mg twice daily.  He was also treated with Lopressor .  On 3/28 patient with apparent single episode while working with PT.  Lopressor  decreased to 12.5 mg twice daily on 3/29.  Unfortunately patient had recurrent tacky-arrhythmia and IV amiodarone  was resumed.  He was to continue amiodarone  for minimum 30 days and possibly as long as 30 months in the postoperative period.  Per Dr. Alvis Ba if the arrhythmia reoccurs after we stop amiodarone  he is best suited for cavotricuspid isthmus ablation as long as long-term management.  He did have  bradycardia and orthostatic hypotension during his admission with 1 episode of syncope.  Ultimately his metoprolol  XL was discontinued due to bradycardia and his PTA amlodipine  was discontinued due to hypotension.  Patient had televisit on 04/01/2024 with Dr. Deloise Ferries.  Patient noted he had some chest pain over the weekend and can take sublingual nitroglycerin .  Patient thought this was related to his high blood pressure.  He was started on Imdur  30 mg daily and his midodrine  was discontinued to assist in blood pressure and chest pain.    Discussed the use of AI scribe software for clinical note transcription with the patient, who gave verbal consent to proceed.  History of Present Illness Dave Williams is a 68 year old male presents with uncontrolled blood pressure and chest pains.  Today his home blood pressure readings average 160 to 170 mmHg systolic, with nocturnal spikes to 170-180/110 mmHg, correlating with his chest pain onset.  His chest pain is triggered by elevated blood pressure, relieved by nitroglycerin .  He notes that his blood pressures are typically well-controlled in the mornings when he takes his Imdur .  However he continues to notice spikes in the afternoon.  He notes that he is experiencing this chest pain daily however he is not experiencing any chest pain on exertion only when he notes his blood pressures to be elevated which typically only occurs at nighttime and not during the daytime.  He reports he has not had symptoms concerning for recurrence of his atrial flutter.  He smoked for over 50 years, quit after CABG, and uses nicotine  patches.  He  denies shortness of breath, lower extremity edema, fatigue, palpitations, melena, hematuria, hemoptysis, diaphoresis, weakness, presyncope, syncope, orthopnea, and PND.  Review of systems:  Please see the history of present illness. All other systems are reviewed and otherwise negative.     Home Medications:    Current  Meds  Medication Sig   amiodarone  (PACERONE ) 200 MG tablet Take 1 tablet (200 mg total) by mouth daily.   amLODipine  (NORVASC ) 10 MG tablet Take 1 tablet (10 mg total) by mouth daily.   aspirin  EC 81 MG tablet Take 1 tablet (81 mg total) by mouth daily. Swallow whole.   HYDROcodone -acetaminophen  (NORCO/VICODIN) 5-325 MG tablet Take 1 tablet by mouth every 6 (six) hours as needed for moderate pain (pain score 4-6).   melatonin 5 MG TABS Take 1 tablet (5 mg total) by mouth at bedtime as needed.   nicotine  (NICODERM CQ  - DOSED IN MG/24 HOURS) 14 mg/24hr patch Place 1 patch (14 mg total) onto the skin daily.   pantoprazole  (PROTONIX ) 40 MG tablet Take 1 tablet (40 mg total) by mouth daily.   vitamin D3 (CHOLECALCIFEROL ) 25 MCG tablet Take 1 tablet (1,000 Units total) by mouth daily.   [DISCONTINUED] amLODipine  (NORVASC ) 5 MG tablet Take 5 mg by mouth daily.   [DISCONTINUED] apixaban  (ELIQUIS ) 5 MG TABS tablet Take 1 tablet (5 mg total) by mouth 2 (two) times daily.   [DISCONTINUED] atorvastatin  (LIPITOR ) 80 MG tablet Take 1 tablet (80 mg total) by mouth daily with supper.   [DISCONTINUED] isosorbide  dinitrate (ISORDIL ) 30 MG tablet Take 30 mg by mouth 4 (four) times daily.   [DISCONTINUED] isosorbide  mononitrate (IMDUR ) 30 MG 24 hr tablet Take 1 tablet (30 mg total) by mouth daily.   [DISCONTINUED] nitroGLYCERIN  (NITROSTAT ) 0.4 MG SL tablet DISSOLVE 1 TABLET UNDER THE TONGUE EVERY 5 MINUTES AS NEEDED FOR CHEST PAIN. DO NOT EXCEED A TOTAL OF 3 DOSES IN 15 MINUTES. (Patient taking differently: Place 0.4 mg under the tongue every 5 (five) minutes as needed for chest pain.)   Studies Reviewed:       Echocardiogram 11/30/2023 1. Left ventricular ejection fraction, by estimation, is 65 to 70%. The  left ventricle has normal function. The left ventricle has no regional  wall motion abnormalities. Left ventricular diastolic parameters are  consistent with Grade I diastolic  dysfunction (impaired  relaxation).   2. Right ventricular systolic function is normal. The right ventricular  size is normal.   3. The mitral valve is normal in structure. Trivial mitral valve  regurgitation. No evidence of mitral stenosis.   4. The aortic valve is tricuspid. There is mild calcification of the  aortic valve. Aortic valve regurgitation is not visualized. Aortic valve  sclerosis/calcification is present, without any evidence of aortic  stenosis.   5. The inferior vena cava is normal in size with greater than 50%  respiratory variability, suggesting right atrial pressure of 3 mmHg.   Cardiac catheterization 11/28/2023 3 vessel obstructive CAD. Suspect the culprit is the mid LCx bifurcation lesion with OM2 and OM3 Normal LVEDP Normal LV function Diagnostic Dominance: Co-dominant  Intervention Risk Assessment/Calculations:    CHA2DS2-VASc Score = 5   This indicates a 7.2% annual risk of stroke. The patient's score is based upon: CHF History: 0 HTN History: 1 Diabetes History: 0 Stroke History: 2 Vascular Disease History: 1 Age Score: 1 Gender Score: 0            Physical Exam:   VS:  BP 128/86 (BP Location: Left  Arm, Patient Position: Sitting, Cuff Size: Normal)   Pulse 72   Ht 6\' 2"  (1.88 m)   Wt 213 lb (96.6 kg)   SpO2 94%   BMI 27.35 kg/m    Wt Readings from Last 3 Encounters:  04/10/24 213 lb (96.6 kg)  03/21/24 207 lb 10.8 oz (94.2 kg)  03/13/24 224 lb (101.6 kg)    GEN: Well nourished, well developed in no acute distress NECK: No JVD; No carotid bruits CARDIAC: RRR, no murmurs, rubs, gallops RESPIRATORY:  Clear to auscultation without rales, wheezing or rhonchi  ABDOMEN: Soft, non-tender, non-distended EXTREMITIES:  No edema; No acute deformity     Assessment and Plan:  Coronary artery disease / Chest pain  LHC 11/2023 showed severe 3V CAD S/p CABG x 3 on 02/29/2024 Notes that he has had ongoing chest pain that started shortly after his hospital discharge.   Addition of Imdur  gave no antianginal benefit.  The chest pain is nonexertional and typically only occurs at nighttime.  He exerts himself during the daytime without pain.  The chest pain is noted to occur when patient's blood pressure is elevated.  Patient notes his average blood pressure at night is 170s-180s systolic and 130s to 140s systolic in the monring.  Of note he did experience some orthostatic hypotension and syncope during admission however this was thought to could be in relation to dehydration. His PTA amlodipine  was discontinued in the setting of hypotension and his PTA metoprolol  XL was discontinued due to bradycardia - Plan to start amlodipine  10 mg in the p.m. - Continue Imdur  30 mg daily in the a.m. - Continue to monitor blood pressure daily at home and stay adequately hydrated - There is no indication for further ischemic evaluation at this time as his angina is likely related to his uncontrolled hypertension.  There is no exertional angina. - Continue aspirin  81 mg daily and atorvastatin  80 mg daily - Has his follow-up post CABG with CT surgery on 05/14/2024 - Recently had BMET and CBC with his primary care.  Will have office fax over updated labs  Postoperative atypical atrial flutter Patient s/p CABG x3 with paroxysmal atrial tachy-arrhythmia. ECG consistent with atypical atrial flutter, 2:1 conduction He was transitioned from IV amiodarone  to p.o. amiodarone  during admission.  Per Dr. Alvis Ba: "Continue this for minimum of 30 days, possibly as long as 3 months in the postoperative period. If the arrhythmia recurs after we stop amiodarone  he is best suited for cavotricuspid isthmus ablation as long-term management." - Today he denies any symptoms worrisome of recurrent atrial flutter - He remains in NSR with auscultation - Continue Eliquis  5 mg twice daily, no bleeding concerns  Hypertension / Orthostatic hypotension / Bradycardia Did have episode of syncope, bradycardia, and  hypotension during recent admission His PTA amlodipine , Imdur , and metoprolol  XL were discontinued due to hypotension. Metoprolol  XL also for bradycardia.  Flomax  was also discontinued due to dehydration He was discharged on midodrine  5 mg, 3 times daily which was subsequently discontinued by CT surgery on 04/01/2024 and he was started on Imdur  for BP/chest pain control - Today he denies any symptoms concerning for orthostatic hypotension.  He brings in his daily blood pressure log showing his average blood pressures in the a.m. of 130s to 140s and average blood pressures in the p.m. of 170s 180s - Plan start patient on amlodipine  10 mg in the p.m. and continue Imdur  30 mg in the a.m. - Continue to monitor blood pressure daily and  maintain adequate hydration  Hyperlipidemia LDL 50, HDL 22, TG 112, TC 94 on 02/2024 LDL under excellent control and under goal of less than 55 - Continue atorvastatin  80 mg daily  Tobacco abuse Abstinent since his CABG, currently using nicotine  patches, committed to cessation - Encouraged patient to continue total cessation      Cardiac Rehabilitation Eligibility Assessment  The patient is NOT ready to start cardiac rehabilitation due to: The patient is experiencing unstable angina.     Dispo:  Return in about 2 weeks (around 04/24/2024).  Signed, Ava Boatman, NP

## 2024-04-10 NOTE — Telephone Encounter (Signed)
 Called patient regarding referral to cardiac rehab and copayment.  Informed patient we would recheck his insurance after his follow up appointment with Dr. Deloise Ferries. He verbalized understanding.

## 2024-04-11 ENCOUNTER — Telehealth: Payer: Self-pay | Admitting: Orthopaedic Surgery

## 2024-04-22 ENCOUNTER — Other Ambulatory Visit (HOSPITAL_COMMUNITY): Payer: Self-pay

## 2024-04-22 ENCOUNTER — Ambulatory Visit: Payer: Medicare HMO | Attending: Cardiovascular Disease | Admitting: Cardiovascular Disease

## 2024-04-22 ENCOUNTER — Encounter: Payer: Self-pay | Admitting: Cardiovascular Disease

## 2024-04-22 VITALS — BP 128/80 | HR 75 | Ht 75.0 in | Wt 210.0 lb

## 2024-04-22 DIAGNOSIS — E7800 Pure hypercholesterolemia, unspecified: Secondary | ICD-10-CM | POA: Diagnosis not present

## 2024-04-22 DIAGNOSIS — Z951 Presence of aortocoronary bypass graft: Secondary | ICD-10-CM | POA: Diagnosis not present

## 2024-04-22 DIAGNOSIS — I480 Paroxysmal atrial fibrillation: Secondary | ICD-10-CM | POA: Insufficient documentation

## 2024-04-22 DIAGNOSIS — Z8679 Personal history of other diseases of the circulatory system: Secondary | ICD-10-CM

## 2024-04-22 MED ORDER — AMIODARONE HCL 100 MG PO TABS
100.0000 mg | ORAL_TABLET | Freq: Every day | ORAL | 3 refills | Status: DC
  Filled 2024-04-22: qty 90, 90d supply, fill #0

## 2024-04-22 MED ORDER — ATORVASTATIN CALCIUM 80 MG PO TABS
80.0000 mg | ORAL_TABLET | Freq: Every day | ORAL | 3 refills | Status: AC
  Filled 2024-04-22: qty 90, 90d supply, fill #0

## 2024-04-22 NOTE — Patient Instructions (Signed)
 Medication Instructions:  Your physician has recommended you make the following change in your medication:   -Decrease amiodarone  (pacerone ) to 100mg  once daily.  *If you need a refill on your cardiac medications before your next appointment, please call your pharmacy*  Follow-Up: At [facility], you and your health needs are our priority.  As part of our continuing mission to provide you with exceptional heart care, our providers are all part of one team.  This team includes your primary Cardiologist (physician) and Advanced Practice Providers or APPs (Physician Assistants and Nurse Practitioners) who all work together to provide you with the care you need, when you need it.  Your next appointment:   3 month(s)  Provider:   Palmer Bobo, NP         Then, Lauro Portal, MD will plan to see you again in 6 month(s).    We recommend signing up for the patient portal called "MyChart".  Sign up information is provided on this After Visit Summary.  MyChart is used to connect with patients for Virtual Visits (Telemedicine).  Patients are able to view lab/test results, encounter notes, upcoming appointments, etc.  Non-urgent messages can be sent to your provider as well.   To learn more about what you can do with MyChart, go to [email or web address].

## 2024-04-22 NOTE — Progress Notes (Signed)
 04/22/2024 Dave Williams   Nov 30, 1956  [id number]  Primary Physician Caresse Chant, FNP Primary Cardiologist: [name] MD Bennye Bravo, [place]  HPI:  Dave Williams is a 68 y.o.  thin appearing divorced Caucasian male with no children who is been disabled because of back issues and was referred for chest pain by the emergency room where he was seen 11/04/2023. 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  I last saw him in the office 11/27/2023.  His risk factors include over 50 pack years tobacco abuse currently smoking 1/2 pack/day, treated hypertension and hyperlipidemia. His father had CAD and had stents notably CABG. He has never had a heart attack but apparently had a TIA 3 years ago. He has had onset of chest pain approxi-6 weeks ago that occurs several times a day and radiates to his jaw, shoulder and left upper extremity. He was given sublingual nitroglycerin  in the ambulance which relieved his chest pain. He has a first responder and EMT as well.  I referred him for outpatient cath which was performed by Dr. Swaziland on 11/28/2023 revealing three-vessel disease.  CABG was recommended.  This was performed by Dr. Deloise Ferries 02/29/2024 with a LIMA to his LAD, vein to the PDA and OM branches.  His postop course was uncomplicated except for some PAF.  He was complaining of some nocturnal angina which improved with the addition of amlodipine  and Imdur .  He had normal LV function.  Of note, he did stop smoking at the time of his bypass surgery.   Current Meds  Medication Sig   amLODipine  (NORVASC ) 10 MG tablet Take 1 tablet (10 mg total) by mouth daily.   apixaban  (ELIQUIS ) 5 MG TABS tablet Take 1 tablet (5 mg total) by mouth 2 (two) times daily.   aspirin  EC 81 MG tablet Take 1 tablet (81 mg total) by mouth daily. Swallow whole.   HYDROcodone -acetaminophen  (NORCO/VICODIN) 5-325 MG tablet Take 1 tablet by mouth every 6 (six) hours as needed for moderate pain (pain  score 4-6).   isosorbide  mononitrate (IMDUR ) 30 MG 24 hr tablet Take 1 tablet (30 mg total) by mouth daily.   nicotine  (NICODERM CQ  - DOSED IN MG/24 HOURS) 14 mg/24hr patch Place 1 patch (14 mg total) onto the skin daily.   nitroGLYCERIN  (NITROSTAT ) 0.4 MG SL tablet Place 1 tablet (0.4 mg total) under the tongue every 5 (five) minutes as needed for chest pain. DISSOLVE 1 TABLET UNDER THE TONGUE EVERY 5 MINUTES AS NEEDED FOR CHEST PAIN. DO NOT EXCEED A TOTAL OF 3 DOSES IN 15 MINUTES.   pantoprazole  (PROTONIX ) 40 MG tablet Take 1 tablet (40 mg total) by mouth daily.   vitamin D3 (CHOLECALCIFEROL ) 25 MCG tablet Take 1 tablet (1,000 Units total) by mouth daily.   [DISCONTINUED] amiodarone  (PACERONE ) 200 MG tablet Take 1 tablet (200 mg total) by mouth daily.   [DISCONTINUED] atorvastatin  (LIPITOR ) 80 MG tablet Take 1 tablet (80 mg total) by mouth daily with supper.     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   Drug use: Not Currently    Types: "Crack" cocaine, Marijuana    Comment: Used for 30+ years but quit in 1990s and has not picked up since   Sexual activity: Yes  Other Topics Concern   Not on file  Social History Narrative   Not on file   Social Drivers of Health   Financial Resource Strain: Not on file  Food Insecurity: No Food Insecurity (03/05/2024)   Hunger Vital Sign    Worried About Running Out of Food in the Last Year: Never true    Ran Out of Food in the Last Year: Never true  Transportation Needs: No  Transportation Needs (03/05/2024)   PRAPARE - Administrator, Civil Service (Medical): No    Lack of Transportation (Non-Medical): No  Physical Activity: Not on file  Stress: Not on file  Social Connections: Moderately Integrated (03/05/2024)   Social Connection and Isolation Panel [NHANES]    Frequency of Communication with Friends and Family: More than three times a week    Frequency of Social Gatherings with Friends and Family: More than three times a week    Attends Religious Services: More than 4 times per year    Active Member of [employer] or Organizations: Yes    Attends Banker Meetings: 1 to 4 times per year    Marital Status: Divorced  Catering manager Violence: Not At Risk (03/05/2024)   Humiliation, Afraid, Rape, and Kick questionnaire    Fear of Current or Ex-Partner: No    Emotionally Abused: No    Physically Abused: No    Sexually Abused: No     Review of Systems: General: negative for chills, fever, night sweats or weight changes.  Cardiovascular: negative for chest pain, dyspnea on exertion, edema, orthopnea, palpitations, paroxysmal nocturnal dyspnea or shortness of breath Dermatological: negative for rash Respiratory: negative for cough or wheezing Urologic: negative for hematuria Abdominal: negative for nausea, vomiting, diarrhea, bright red blood per rectum, melena, or hematemesis Neurologic: negative for visual changes, syncope, or dizziness All other systems reviewed and are otherwise negative except as noted above.    Blood pressure 128/80, pulse 75, height 6\' 3"  (1.905 m), weight 210 lb (95.3 kg), SpO2 94%.  General appearance: alert and no distress Neck: no adenopathy, no carotid bruit, no JVD, supple, symmetrical, trachea midline, and thyroid not enlarged, symmetric, no tenderness/mass/nodules Lungs: clear to auscultation bilaterally Heart: regular rate and rhythm, S1, S2 normal, no murmur, click, rub or gallop Extremities:  extremities normal, atraumatic, no cyanosis or edema Pulses: 2+ and symmetric Skin: Skin color, texture, turgor normal. No rashes or lesions Neurologic: Grossly normal  EKG not performed today      ASSESSMENT AND PLAN:   History of hypertension History of essential hypertension with blood pressure measured today at 128/80.  He is on amlodipine .  Hypercholesterolemia History of hyperlipidemia on high-dose atorvastatin  with lipid profile performed 3/26 #25 revealing total cholesterol 94, LDL 50 and HDL of 22, at goal for secondary prevention.  S/P CABG x 3 History of CAD status post cardiac catheterization by Dr. Swaziland 11/28/2023 revealing three-vessel disease most suitably treated with CABG.  He ultimately went CABG x 3 by Dr. Deloise Ferries 02/29/2024 with a LIMA to his LAD, vein to PDA and OM branches.  He had normal LV function.  Postop course was notable for PAF currently on Eliquis  and amiodarone .  He was having some nocturnal angina which is improved with the addition of amlodipine  and Imdur .  PAF (paroxysmal atrial fibrillation) (HCC) History of PAF maintaining sinus rhythm on Eliquis  and amiodarone .  It is now 2 months postop.  I am going to decrease the amiodarone  from 200 to 100 mg a day.  At his next office visit if he still in sinus rhythm we will discontinue both of these drugs.     [name] MD FACP,FACC,FAHA, [facility] 04/22/2024 1:43 PM

## 2024-04-22 NOTE — Assessment & Plan Note (Signed)
 History of PAF maintaining sinus rhythm on Eliquis  and amiodarone .  It is now 2 months postop.  I am going to decrease the amiodarone  from 200 to 100 mg a day.  At his next office visit if he still in sinus rhythm we will discontinue both of these drugs.

## 2024-04-22 NOTE — Assessment & Plan Note (Signed)
 History of essential hypertension with blood pressure measured today at 128/80.  He is on amlodipine .

## 2024-04-22 NOTE — Assessment & Plan Note (Signed)
 History of hyperlipidemia on high-dose atorvastatin  with lipid profile performed 3/26 #25 revealing total cholesterol 94, LDL 50 and HDL of 22, at goal for secondary prevention.

## 2024-04-22 NOTE — Assessment & Plan Note (Signed)
 History of CAD status post cardiac catheterization by Dr. Swaziland 11/28/2023 revealing three-vessel disease most suitably treated with CABG.  He ultimately went CABG x 3 by Dr. Deloise Ferries 02/29/2024 with a LIMA to his LAD, vein to PDA and OM branches.  He had normal LV function.  Postop course was notable for PAF currently on Eliquis  and amiodarone .  He was having some nocturnal angina which is improved with the addition of amlodipine  and Imdur .

## 2024-05-08 ENCOUNTER — Other Ambulatory Visit: Payer: Self-pay | Admitting: Thoracic Surgery (Cardiothoracic Vascular Surgery)

## 2024-05-08 ENCOUNTER — Ambulatory Visit: Payer: Medicare HMO | Admitting: Orthopaedic Surgery

## 2024-05-08 DIAGNOSIS — Z951 Presence of aortocoronary bypass graft: Secondary | ICD-10-CM

## 2024-05-08 NOTE — Progress Notes (Signed)
 [address]       [place] [address]             [phone]       HPI: Dave Williams is a 68 year old male with a past medical history of CAD, HTN, HLD, and CVA. The patient returns for routine postoperative follow-up having undergone CABG x 3 by Dr. Deloise Ferries on 03/04/24. The patient's early postoperative recovery while in the hospital was notable for urinary retention requiring foley replacement and flomax , SVT requiring Amiodarone  drip with successful conversion to NSR but developed orthostatic hypotension requiring Midodrine  and was started on Eliquis . He was felt stable for discharge to CIR on 04/02.  Since hospital discharge the patient reports continued angina with exertion but states nocturnal angina has resolved with addition of Imdur .  He continues to have chest soreness but no longer requires pain medication for this although he does receive hydrocodone  from the hip doctor.  He does report he is still weak and lethargic and has shortness of breath with exertion. He denies lower extremity swelling, dizziness, loss of consciousness.  Current Outpatient Medications  Medication Sig Dispense Refill   amiodarone  (PACERONE ) 100 MG tablet Take 1 tablet (100 mg total) by mouth daily. 90 tablet 3   amLODipine  (NORVASC ) 10 MG tablet Take 1 tablet (10 mg total) by mouth daily. 90 tablet 2   apixaban  (ELIQUIS ) 5 MG TABS tablet Take 1 tablet (5 mg total) by mouth 2 (two) times daily. 60 tablet 0   aspirin  EC 81 MG tablet Take 1 tablet (81 mg total) by mouth daily. Swallow whole. 30 tablet 0   atorvastatin  (LIPITOR ) 80 MG tablet Take 1 tablet (80 mg total) by mouth daily with supper. 90 tablet 3   HYDROcodone -acetaminophen  (NORCO/VICODIN) 5-325 MG tablet Take 1 tablet by mouth every 6 (six) hours as needed for moderate pain (pain score 4-6). 56 tablet 0   isosorbide  mononitrate (IMDUR ) 30 MG 24 hr tablet Take 1 tablet (30 mg total) by mouth daily. 90 tablet 1   nicotine   (NICODERM CQ  - DOSED IN MG/24 HOURS) 14 mg/24hr patch Place 1 patch (14 mg total) onto the skin daily.     nitroGLYCERIN  (NITROSTAT ) 0.4 MG SL tablet Place 1 tablet (0.4 mg total) under the tongue every 5 (five) minutes as needed for chest pain. DISSOLVE 1 TABLET UNDER THE TONGUE EVERY 5 MINUTES AS NEEDED FOR CHEST PAIN. DO NOT EXCEED A TOTAL OF 3 DOSES IN 15 MINUTES. 25 tablet 6   pantoprazole  (PROTONIX ) 40 MG tablet Take 1 tablet (40 mg total) by mouth daily. 30 tablet 0   vitamin D3 (CHOLECALCIFEROL ) 25 MCG tablet Take 1 tablet (1,000 Units total) by mouth daily. 30 tablet 0   No current facility-administered medications for this visit.   Vitals: Today's Vitals   05/14/24 1412  BP: 119/74  Pulse: 80  Resp: 20  SpO2: 97%  Weight: 226 lb (102.5 kg)  Height: 6\' 3"  (1.905 m)   Body mass index is 28.25 kg/m.  Physical Exam: General: Alert and oriented, no acute distress Neuro: Grossly intact CV: Regular rate and rhythm, no murmur Pulm: Clear to auscultation bilaterally GI: Positive bowel sounds, nontender Extremities: No lower extremity edema Wound: Healing well without sign of infection  Diagnostic Tests: Chest x-ray without effusions, atelectasis, infiltrate.  Pending final read.  Impression/Plan: S/P CABG: The patient is slowly progressing from surgery.  He reports lethargy, we discussed that this should continue to  improve with time.  He reports chest soreness but no longer needs narcotic pain medication for his chest, although he does receive hydrocodone  for his hip. He does also admit to angina with exertion requiring nitroglycerin  at times. He did also have nocturnal angina that resolved when Imdur  was restarted by cardiology.  I told him to reach out to cardiology and let them know that he is still experiencing angina requiring nitroglycerin .  He has quit smoking since prior to surgery but states he is still short of breath.  I suspect this is due to deconditioning considering  his chest x-ray shows no sign of pleural effusion, atelectasis, or infiltrate.  We discussed the importance of increasing ambulation and activity as well as starting cardiac rehab. Hopefully this will continue to improve with time. I will clear him for cardiac rehab today.  His incisions are healing well without signs of infection.  He has been driving without difficulty. He is tolerating his medications well, I will not make any medication changes today.  We reviewed continued sternal precautions for 3 months postoperatively.  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  Plan to have the patient return to clinic as needed.  Postoperative Atrial Fibrillation: He is on amiodarone  100 mg daily and Eliquis .  Per cardiology notes this may be stopped at his next cardiology appointment.   Randa Burton, PA-C [facility] [phone]

## 2024-05-13 ENCOUNTER — Telehealth: Payer: Self-pay | Admitting: Orthopaedic Surgery

## 2024-05-13 ENCOUNTER — Other Ambulatory Visit: Payer: Self-pay | Admitting: Thoracic Surgery (Cardiothoracic Vascular Surgery)

## 2024-05-14 ENCOUNTER — Ambulatory Visit (HOSPITAL_COMMUNITY)
Admission: RE | Admit: 2024-05-14 | Discharge: 2024-05-14 | Disposition: A | Discharge status: Home / Self Care | Source: Ambulatory Visit | Attending: Cardiovascular Disease | Admitting: Cardiovascular Disease

## 2024-05-14 ENCOUNTER — Ambulatory Visit: Payer: Self-pay | Attending: Thoracic Surgery (Cardiothoracic Vascular Surgery) | Admitting: Physician Assistant

## 2024-05-14 VITALS — BP 119/74 | HR 80 | Resp 20 | Ht 75.0 in | Wt 226.0 lb

## 2024-05-14 DIAGNOSIS — Z951 Presence of aortocoronary bypass graft: Secondary | ICD-10-CM

## 2024-05-14 NOTE — Patient Instructions (Signed)
 Continue to avoid any heavy lifting or strenuous use of your arms or shoulders for at least a total of three months from the time of surgery.  After three months you may gradually increase how much you lift or otherwise use your arms or chest as tolerated, with limits based upon whether or not activities lead to the return of significant discomfort.  You are encouraged to enroll and participate in the outpatient cardiac rehab program beginning as soon as practical.  You may return to driving an automobile as long as you are no longer requiring oral narcotic pain relievers during the daytime.  It would be wise to start driving only short distances during the daylight and gradually increase from there as you feel comfortable.

## 2024-05-15 ENCOUNTER — Ambulatory Visit: Admitting: Orthopaedic Surgery

## 2024-05-15 ENCOUNTER — Encounter: Payer: Self-pay | Admitting: Orthopaedic Surgery

## 2024-05-15 VITALS — BP 140/87 | HR 73 | Ht 75.5 in | Wt 225.0 lb

## 2024-05-15 DIAGNOSIS — M5442 Lumbago with sciatica, left side: Secondary | ICD-10-CM | POA: Diagnosis not present

## 2024-05-15 DIAGNOSIS — G8929 Other chronic pain: Secondary | ICD-10-CM

## 2024-05-15 NOTE — Progress Notes (Signed)
 I have had heart surgery.  He had five stents placed in his heart in late March.  He is doing well now.  He is on Eliquis .  He starts cardiac rehab this week.  I told him to tell them about his lower back pain.  He has less back pain but he has been doing little.  He has to stand now and then from sitting too long.  He has no trauma to the back.  His wife is now in a nursing home in [place].  Back has good ROM, gait is good, muscle tone and strength normal.  NV intact.  Encounter Diagnosis  Name Primary?   Chronic left-sided low back pain with left-sided sciatica Yes   Return in six weeks.  Continue his medicine.  Call if any problem.  Precautions discussed.  Electronically Signed Pleasant Brilliant, MD 6/4/20259:38 AM

## 2024-05-15 NOTE — Patient Instructions (Signed)
 Follow up in 3 months with Dr. Iline Mallory

## 2024-05-16 ENCOUNTER — Encounter (HOSPITAL_COMMUNITY)
Admission: RE | Admit: 2024-05-16 | Discharge: 2024-05-16 | Disposition: A | Discharge status: Home / Self Care | Source: Ambulatory Visit | Attending: Cardiovascular Disease | Admitting: Cardiovascular Disease

## 2024-05-16 DIAGNOSIS — Z951 Presence of aortocoronary bypass graft: Secondary | ICD-10-CM | POA: Insufficient documentation

## 2024-05-16 NOTE — Progress Notes (Signed)
 Virtual orientation visit completed for Cardiac rehab with S/P CABGx3. On-site orientation visit scheduled for 05/20/24 at 10.

## 2024-05-20 ENCOUNTER — Encounter (HOSPITAL_COMMUNITY)
Admission: RE | Admit: 2024-05-20 | Discharge: 2024-05-20 | Disposition: A | Discharge status: Home / Self Care | Source: Ambulatory Visit | Attending: Cardiovascular Disease

## 2024-05-20 VITALS — Ht 73.0 in | Wt 226.5 lb

## 2024-05-20 DIAGNOSIS — Z951 Presence of aortocoronary bypass graft: Secondary | ICD-10-CM | POA: Diagnosis present

## 2024-05-20 NOTE — Progress Notes (Signed)
 Cardiac Individual Treatment Plan  Patient Details  Name: Dave Williams MRN: [id number] Date of Birth: [birth year] Referring Provider:   Flowsheet Row CARDIAC REHAB PHASE II ORIENTATION from 05/20/2024 in [facility] CARDIAC REHABILITATION  Referring Provider Lauro Portal MD       Initial Encounter Date:  Flowsheet Row CARDIAC REHAB PHASE II ORIENTATION from 05/20/2024 in [facility] CARDIAC REHABILITATION  Date 05/20/24       Visit Diagnosis: S/P CABG x 3  Patient's Home Medications on Admission:  Current Outpatient Medications:    amiodarone  (PACERONE ) 100 MG tablet, Take 1 tablet (100 mg total) by mouth daily., Disp: 90 tablet, Rfl: 3   amLODipine  (NORVASC ) 10 MG tablet, Take 1 tablet (10 mg total) by mouth daily., Disp: 90 tablet, Rfl: 2   apixaban  (ELIQUIS ) 5 MG TABS tablet, Take 1 tablet (5 mg total) by mouth 2 (two) times daily., Disp: 60 tablet, Rfl: 0   aspirin  EC 81 MG tablet, Take 1 tablet (81 mg total) by mouth daily. Swallow whole., Disp: 30 tablet, Rfl: 0   atorvastatin  (LIPITOR ) 80 MG tablet, Take 1 tablet (80 mg total) by mouth daily with supper., Disp: 90 tablet, Rfl: 3   HYDROcodone -acetaminophen  (NORCO/VICODIN) 5-325 MG tablet, Take 1 tablet by mouth every 6 (six) hours as needed for moderate pain (pain score 4-6)., Disp: 56 tablet, Rfl: 0   isosorbide  mononitrate (IMDUR ) 30 MG 24 hr tablet, Take 1 tablet (30 mg total) by mouth daily., Disp: 90 tablet, Rfl: 1   nicotine  (NICODERM CQ  - DOSED IN MG/24 HOURS) 14 mg/24hr patch, Place 1 patch (14 mg total) onto the skin daily. (Patient not taking: Reported on 05/16/2024), Disp: , Rfl:    nitroGLYCERIN  (NITROSTAT ) 0.4 MG SL tablet, Place 1 tablet (0.4 mg total) under the tongue every 5 (five) minutes as needed for chest pain. DISSOLVE 1 TABLET UNDER THE TONGUE EVERY 5 MINUTES AS NEEDED FOR CHEST PAIN. DO NOT EXCEED A TOTAL OF 3 DOSES IN 15 MINUTES., Disp: 25 tablet, Rfl: 6   pantoprazole  (PROTONIX ) 40 MG tablet, Take 1  tablet (40 mg total) by mouth daily., Disp: 30 tablet, Rfl: 0   vitamin D3 (CHOLECALCIFEROL ) 25 MCG tablet, Take 1 tablet (1,000 Units total) by mouth daily., Disp: 30 tablet, Rfl: 0  Past Medical History: Past Medical History:  Diagnosis Date   Chronic left hip pain    Compression fracture of spine (HCC)    multiple compression Fx with disc bulge   Coronary artery disease    Hypertension    Pneumonia    As a teenager   Stroke [facility]) 2022   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    Tobacco Use: Social History   Tobacco Use  Smoking Status Former   Current packs/day: 0.50   Types: Cigarettes  Smokeless Tobacco Never    Labs: Review Flowsheet       Latest Ref Rng & Units 02/29/2024 03/04/2024 03/06/2024  Labs for ITP Cardiac and Pulmonary Rehab  Cholestrol 0 - 200 mg/dL - - 94   LDL (calc) 0 - 99 mg/dL - - 50   HDL-C >53 mg/dL - - 22   Trlycerides <664 mg/dL - - 403   Hemoglobin K7Q 4.8 - 5.6 % 5.8  - -  PH, Arterial 7.35 - 7.45 - 7.324  7.306  7.340  7.280  7.271  7.290  7.314  -  PCO2 arterial 32 -  48 mmHg - 41.3  40.1  43.4  32.8  49.2  48.8  51.8  -  Bicarbonate 20.0 - 28.0 mmol/L - 21.1  19.7  23.1  15.4  22.8  23.5  25.5  26.4  -  TCO2 22 - 32 mmol/L - 22  21  24  16  24  23  25  26  27  28  25  25   -  Acid-base deficit 0.0 - 2.0 mmol/L - 4.0  6.0  2.0  10.0  4.0  3.0  1.0  -  O2 Saturation % - 95  97  99  98  98  96  84  100  -    Details       Multiple values from one day are sorted in reverse-chronological order         Capillary Blood Glucose: Lab Results  Component Value Date   GLUCAP 113 (H) 03/08/2024   GLUCAP 121 (H) 03/07/2024   GLUCAP 112 (H) 03/07/2024   GLUCAP 111 (H) 03/07/2024   GLUCAP 136 (H) 03/06/2024     Exercise Target Goals: Exercise Program Goal: Individual exercise prescription set using results from initial 6 min walk test and THRR while considering  patient's activity  barriers and safety.   Exercise Prescription Goal: Starting with aerobic activity 30 plus minutes a day, 3 days per week for initial exercise prescription. Provide home exercise prescription and guidelines that participant acknowledges understanding prior to discharge.  Activity Barriers & Risk Stratification:  Activity Barriers & Cardiac Risk Stratification - 05/16/24 0921       Activity Barriers & Cardiac Risk Stratification   Activity Barriers Back Problems;Chest Pain/Angina;Shortness of Breath   Multiple back sugeries for back.   Cardiac Risk Stratification High             6 Minute Walk:  6 Minute Walk     Row Name 05/20/24 1119         6 Minute Walk   Phase Initial     Distance 1432 feet     Walk Time 6 minutes     # of Rest Breaks 0     MPH 2.71     METS 3.31     RPE 8     VO2 Peak 11.57     Symptoms No     Resting HR 69 bpm     Resting BP 110/70     Resting Oxygen Saturation  95 %     Exercise Oxygen Saturation  during 6 min walk 97 %     Max Ex. HR 99 bpm     Max Ex. BP 136/62     2 Minute Post BP 126/62              Oxygen Initial Assessment:   Oxygen Re-Evaluation:   Oxygen Discharge (Final Oxygen Re-Evaluation):   Initial Exercise Prescription:  Initial Exercise Prescription - 05/20/24 1100       Date of Initial Exercise RX and Referring Provider   Date 05/20/24    Referring Provider Lauro Portal MD      Oxygen   Maintain Oxygen Saturation 88% or higher      Treadmill   MPH 2.7    Grade 1    Minutes 15    METs 3.44      REL-XR   Level 13    Speed 50    Minutes 15    METs 3.4  Prescription Details   Frequency (times per week) 1   starting with one to meet OOP   Duration Progress to 30 minutes of continuous aerobic without signs/symptoms of physical distress      Intensity   THRR 40-80% of Max Heartrate 102-137    Ratings of Perceived Exertion 11-13    Perceived Dyspnea 0-4      Progression   Progression  Continue to progress workloads to maintain intensity without signs/symptoms of physical distress.      Resistance Training   Training Prescription Yes    Weight 5 lb    Reps 10-15             Perform Capillary Blood Glucose checks as needed.  Exercise Prescription Changes:   Exercise Prescription Changes     Row Name 05/20/24 1100             Response to Exercise   Blood Pressure (Admit) 110/70       Blood Pressure (Exercise) 136/62       Blood Pressure (Exit) 126/62       Heart Rate (Admit) 69 bpm       Heart Rate (Exercise) 99 bpm       Heart Rate (Exit) 76 bpm       Oxygen Saturation (Admit) 95 %       Oxygen Saturation (Exercise) 97 %       Rating of Perceived Exertion (Exercise) 8       Symptoms none       Comments walk test results                Exercise Comments:   Exercise Goals and Review:   Exercise Goals     Row Name 05/20/24 1122             Exercise Goals   Increase Physical Activity Yes       Intervention Provide advice, education, support and counseling about physical activity/exercise needs.;Develop an individualized exercise prescription for aerobic and resistive training based on initial evaluation findings, risk stratification, comorbidities and participant's personal goals.       Expected Outcomes Short Term: Attend rehab on a regular basis to increase amount of physical activity.;Long Term: Add in home exercise to make exercise part of routine and to increase amount of physical activity.;Long Term: Exercising regularly at least 3-5 days a week.       Increase Strength and Stamina Yes       Intervention Provide advice, education, support and counseling about physical activity/exercise needs.;Develop an individualized exercise prescription for aerobic and resistive training based on initial evaluation findings, risk stratification, comorbidities and participant's personal goals.       Expected Outcomes Short Term: Increase workloads  from initial exercise prescription for resistance, speed, and METs.;Long Term: Improve cardiorespiratory fitness, muscular endurance and strength as measured by increased METs and functional capacity ( );Short Term: Perform resistance training exercises routinely during rehab and add in resistance training at home       Able to understand and use rate of perceived exertion (RPE) scale Yes       Intervention Provide education and explanation on how to use RPE scale       Expected Outcomes Short Term: Able to use RPE daily in rehab to express subjective intensity level;Long Term:  Able to use RPE to guide intensity level when exercising independently       Able to understand and use Dyspnea scale Yes  Intervention Provide education and explanation on how to use Dyspnea scale       Expected Outcomes Short Term: Able to use Dyspnea scale daily in rehab to express subjective sense of shortness of breath during exertion;Long Term: Able to use Dyspnea scale to guide intensity level when exercising independently       Knowledge and understanding of Target Heart Rate Range (THRR) Yes       Intervention Provide education and explanation of THRR including how the numbers were predicted and where they are located for reference       Expected Outcomes Short Term: Able to state/look up THRR;Short Term: Able to use daily as guideline for intensity in rehab;Long Term: Able to use THRR to govern intensity when exercising independently       Able to check pulse independently Yes       Intervention Provide education and demonstration on how to check pulse in carotid and radial arteries.;Review the importance of being able to check your own pulse for safety during independent exercise       Expected Outcomes Short Term: Able to explain why pulse checking is important during independent exercise;Long Term: Able to check pulse independently and accurately       Understanding of Exercise Prescription Yes        Intervention Provide education, explanation, and written materials on patient's individual exercise prescription       Expected Outcomes Short Term: Able to explain program exercise prescription;Long Term: Able to explain home exercise prescription to exercise independently                Exercise Goals Re-Evaluation :    Discharge Exercise Prescription (Final Exercise Prescription Changes):  Exercise Prescription Changes - 05/20/24 1100       Response to Exercise   Blood Pressure (Admit) 110/70    Blood Pressure (Exercise) 136/62    Blood Pressure (Exit) 126/62    Heart Rate (Admit) 69 bpm    Heart Rate (Exercise) 99 bpm    Heart Rate (Exit) 76 bpm    Oxygen Saturation (Admit) 95 %    Oxygen Saturation (Exercise) 97 %    Rating of Perceived Exertion (Exercise) 8    Symptoms none    Comments walk test results             Nutrition:  Target Goals: Understanding of nutrition guidelines, daily intake of sodium 1500mg , cholesterol 200mg , calories 30% from fat and 7% or less from saturated fats, daily to have 5 or more servings of fruits and vegetables.  Biometrics:  Pre Biometrics - 05/20/24 1122       Pre Biometrics   Height 6\' 1"  (1.854 m)    Weight 102.7 kg    Waist Circumference 31 inches    Hip Circumference 44 inches    Waist to Hip Ratio 0.7 %    BMI (Calculated) 29.89    Grip Strength 32.7 kg    Single Leg Stand 28.2 seconds              Nutrition Therapy Plan and Nutrition Goals:   Nutrition Assessments:  MEDIFICTS Score Key: >=70 Need to make dietary changes  40-70 Heart Healthy Diet <= 40 Therapeutic Level Cholesterol Diet  Flowsheet Row CARDIAC REHAB PHASE II ORIENTATION from 05/20/2024 in [facility] CARDIAC REHABILITATION  Picture Your Plate Total Score on Admission 30      Picture Your Plate Scores: <29 Unhealthy dietary pattern with much room for improvement. 41-50 Dietary  pattern unlikely to meet recommendations for good health  and room for improvement. 51-60 More healthful dietary pattern, with some room for improvement.  >60 Healthy dietary pattern, although there may be some specific behaviors that could be improved.    Nutrition Goals Re-Evaluation:   Nutrition Goals Discharge (Final Nutrition Goals Re-Evaluation):   Psychosocial: Target Goals: Acknowledge presence or absence of significant depression and/or stress, maximize coping skills, provide positive support system. Participant is able to verbalize types and ability to use techniques and skills needed for reducing stress and depression.  Initial Review & Psychosocial Screening:  Initial Psych Review & Screening - 05/16/24 0944       Initial Review   Current issues with Current Sleep Concerns      Family Dynamics   Good Support System? Yes      Barriers   Psychosocial barriers to participate in program The patient should benefit from training in stress management and relaxation.;There are no identifiable barriers or psychosocial needs.      Screening Interventions   Interventions To provide support and resources with identified psychosocial needs;Encouraged to exercise;Provide feedback about the scores to participant    Expected Outcomes Short Term goal: Utilizing psychosocial counselor, staff and physician to assist with identification of specific Stressors or current issues interfering with healing process. Setting desired goal for each stressor or current issue identified.;Long Term Goal: Stressors or current issues are controlled or eliminated.;Short Term goal: Identification and review with participant of any Quality of Life or Depression concerns found by scoring the questionnaire.;Long Term goal: The participant improves quality of Life and PHQ9 Scores as seen by post scores and/or verbalization of changes             Quality of Life Scores:  Quality of Life - 05/20/24 1123       Quality of Life   Select Quality of Life       Quality of Life Scores   Health/Function Pre 19.2 %    Socioeconomic Pre 26.57 %    Psych/Spiritual Pre 12.93 %    Family Pre 27.5 %    GLOBAL Pre 20.03 %            Scores of 19 and below usually indicate a poorer quality of life in these areas.  A difference of  2-3 points is a clinically meaningful difference.  A difference of 2-3 points in the total score of the Quality of Life Index has been associated with significant improvement in overall quality of life, self-image, physical symptoms, and general health in studies assessing change in quality of life.  PHQ-9: Review Flowsheet       05/20/2024  Depression screen PHQ 2/9  Decreased Interest 1  Down, Depressed, Hopeless 1  PHQ - 2 Score 2  Altered sleeping 2  Tired, decreased energy 3  Change in appetite 0  Feeling bad or failure about yourself  1  Trouble concentrating 0  Moving slowly or fidgety/restless 1  Suicidal thoughts 0  PHQ-9 Score 9  Difficult doing work/chores Not difficult at all   Interpretation of Total Score  Total Score Depression Severity:  1-4 = Minimal depression, 5-9 = Mild depression, 10-14 = Moderate depression, 15-19 = Moderately severe depression, 20-27 = Severe depression   Psychosocial Evaluation and Intervention:  Psychosocial Evaluation - 05/16/24 0945       Psychosocial Evaluation & Interventions   Interventions Stress management education;Relaxation education;Encouraged to exercise with the program and follow exercise prescription  Comments 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 He denies any depression, anxiety or stressors. He does report some difficulty staying asleep and has been taking Advil PM long term for sleep. He lives alone. He says he has friends and neigbors that would help him out if needed. 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 He has a $25 copayment and is about to meet his out of pocket maximun which will end the  copayment. He is only going to do 1 day/week until he meets the OOP and then go to 3 days/week. His goals for the program are to get his stamina back; improve his muscle tone and get back in shape overall. He has no barriers identified to complete the program.    Expected Outcomes Short Term: Patient will start the program and attend consistently. Long Term: Patient will complete the program meeting personal goals.    Continue Psychosocial Services  Follow up required by staff             Psychosocial Re-Evaluation:   Psychosocial Discharge (Final Psychosocial Re-Evaluation):   Vocational Rehabilitation: Provide vocational rehab assistance to qualifying candidates.   Vocational Rehab Evaluation & Intervention:  Vocational Rehab - 05/16/24 5784       Initial Vocational Rehab Evaluation & Intervention   Assessment shows need for Vocational Rehabilitation No      Vocational Rehab Re-Evaulation   Comments Disabled.             Education: Education Goals: Education classes will be provided on a weekly basis, covering required topics. Participant will state understanding/return demonstration of topics presented.  Learning Barriers/Preferences:  Learning Barriers/Preferences - 05/16/24 6962       Learning Barriers/Preferences   Learning Barriers None    Learning Preferences Written Material;Audio;Skilled Demonstration             Education Topics: Hypertension, Hypertension Reduction -Define heart disease and high blood pressure. Discus how high blood pressure affects the body and ways to reduce high blood pressure.   Exercise and Your Heart -Discuss why it is important to exercise, the FITT principles of exercise, normal and abnormal responses to exercise, and how to exercise safely.   Angina -Discuss definition of angina, causes of angina, treatment of angina, and how to decrease risk of having angina.   Cardiac Medications -Review what the following  cardiac medications are used for, how they affect the body, and side effects that may occur when taking the medications.  Medications include Aspirin , Beta blockers, calcium  channel blockers, ACE Inhibitors, angiotensin receptor blockers, diuretics, digoxin, and antihyperlipidemics.   Congestive Heart Failure -Discuss the definition of CHF, how to live with CHF, the signs and symptoms of CHF, and how keep track of weight and sodium intake.   Heart Disease and Intimacy -Discus the effect sexual activity has on the heart, how changes occur during intimacy as we age, and safety during sexual activity.   Smoking Cessation / COPD -Discuss different methods to quit smoking, the health benefits of quitting smoking, and the definition of COPD.   Nutrition I: Fats -Discuss the types of cholesterol, what cholesterol does to the heart, and how cholesterol levels can be controlled.   Nutrition II: Labels -Discuss the different components of food labels and how to read food label   Heart Parts/Heart Disease and PAD -Discuss the anatomy of the heart, the pathway of blood circulation through the heart, and these are affected  by heart disease.   Stress I: Signs and Symptoms -Discuss the causes of stress, how stress may lead to anxiety and depression, and ways to limit stress.   Stress II: Relaxation -Discuss different types of relaxation techniques to limit stress.   Warning Signs of Stroke / TIA -Discuss definition of a stroke, what the signs and symptoms are of a stroke, and how to identify when someone is having stroke.   Knowledge Questionnaire Score:  Knowledge Questionnaire Score - 05/20/24 1043       Knowledge Questionnaire Score   Pre Score 25/28             Core Components/Risk Factors/Patient Goals at Admission:  Personal Goals and Risk Factors at Admission - 05/20/24 1123       Core Components/Risk Factors/Patient Goals on Admission    Weight Management Weight  Loss;Yes    Intervention Weight Management: Develop a combined nutrition and exercise program designed to reach desired caloric intake, while maintaining appropriate intake of nutrient and fiber, sodium and fats, and appropriate energy expenditure required for the weight goal.;Weight Management: Provide education and appropriate resources to help participant work on and attain dietary goals.;Weight Management/Obesity: Establish reasonable short term and long term weight goals.    Admit Weight 226 lb 8 oz (102.7 kg)    Goal Weight: Short Term 221 lb (100.2 kg)    Goal Weight: Long Term 216 lb (98 kg)    Expected Outcomes Short Term: Continue to assess and modify interventions until short term weight is achieved;Long Term: Adherence to nutrition and physical activity/exercise program aimed toward attainment of established weight goal;Weight Loss: Understanding of general recommendations for a balanced deficit meal plan, which promotes 1-2 lb weight loss per week and includes a negative energy balance of [phone] kcal/d;Understanding recommendations for meals to include 15-35% energy as protein, 25-35% energy from fat, 35-60% energy from carbohydrates, less than 200mg  of dietary cholesterol, 20-35 gm of total fiber daily;Understanding of distribution of calorie intake throughout the day with the consumption of 4-5 meals/snacks    Improve shortness of breath with ADL's Yes    Intervention Provide education, individualized exercise plan and daily activity instruction to help decrease symptoms of SOB with activities of daily living.    Expected Outcomes Short Term: Improve cardiorespiratory fitness to achieve a reduction of symptoms when performing ADLs;Long Term: Be able to perform more ADLs without symptoms or delay the onset of symptoms    Hypertension Yes    Intervention Provide education on lifestyle modifcations including regular physical activity/exercise, weight management, moderate sodium restriction  and increased consumption of fresh fruit, vegetables, and low fat dairy, alcohol moderation, and smoking cessation.;Monitor prescription use compliance.    Expected Outcomes Short Term: Continued assessment and intervention until BP is < 140/41mm HG in hypertensive participants. < 130/12mm HG in hypertensive participants with diabetes, heart failure or chronic kidney disease.;Long Term: Maintenance of blood pressure at goal levels.    Lipids Yes    Intervention Provide education and support for participant on nutrition & aerobic/resistive exercise along with prescribed medications to achieve LDL 70mg , HDL >40mg .    Expected Outcomes Short Term: Participant states understanding of desired cholesterol values and is compliant with medications prescribed. Participant is following exercise prescription and nutrition guidelines.;Long Term: Cholesterol controlled with medications as prescribed, with individualized exercise RX and with personalized nutrition plan. Value goals: LDL < 70mg , HDL > 40 mg.             Core Components/Risk  Factors/Patient Goals Review:    Core Components/Risk Factors/Patient Goals at Discharge (Final Review):    ITP Comments:  ITP Comments     Row Name 05/16/24 0952           ITP Comments Virtual orientation visit completed for Cardiac rehab with S/P CABGx3. On-site orientation visit scheduled for 05/20/24 at 10.                Comments: Patient arrived for 1st visit/orientation/education at 1000. Patient was referred to CR by Dr. Starleen Eastern and attending cardiologist Peter [place] due to S/P CABGx3. During orientation advised patient on arrival and appointment times what to wear, what to do before, during and after exercise. Reviewed attendance and class policy.  Pt is scheduled to return Cardiac Rehab on 05/22/24 at 915. Pt was advised to come to class 15 minutes before class starts.  Discussed RPE/Dpysnea scales. Patient participated in warm up stretches.  Patient was able to complete 6 minute walk test.  Telemetry:NSR. Patient was measured for the equipment. Discussed equipment safety with patient. Took patient pre-anthropometric measurements. Patient finished visit at 1045.

## 2024-05-20 NOTE — Patient Instructions (Signed)
 Patient Instructions  Patient Details  Name: Dave Williams MRN: [id number] Date of Birth: February 05, 1956 Referring Provider:  Hilarie Lovely, MD  Below are your personal goals for exercise, nutrition, and risk factors. Our goal is to help you stay on track towards obtaining and maintaining these goals. We will be discussing your progress on these goals with you throughout the program.  Initial Exercise Prescription:  Initial Exercise Prescription - 05/20/24 1100       Date of Initial Exercise RX and Referring Provider   Date 05/20/24    Referring Provider Lauro Portal MD      Oxygen   Maintain Oxygen Saturation 88% or higher      Treadmill   MPH 2.7    Grade 1    Minutes 15    METs 3.44      REL-XR   Level 13    Speed 50    Minutes 15    METs 3.4      Prescription Details   Frequency (times per week) 1   starting with one to meet OOP   Duration Progress to 30 minutes of continuous aerobic without signs/symptoms of physical distress      Intensity   THRR 40-80% of Max Heartrate 102-137    Ratings of Perceived Exertion 11-13    Perceived Dyspnea 0-4      Progression   Progression Continue to progress workloads to maintain intensity without signs/symptoms of physical distress.      Resistance Training   Training Prescription Yes    Weight 5 lb    Reps 10-15             Exercise Goals: Frequency: Be able to perform aerobic exercise two to three times per week in program working toward 2-5 days per week of home exercise.  Intensity: Work with a perceived exertion of 11 (fairly light) - 15 (hard) while following your exercise prescription.  We will make changes to your prescription with you as you progress through the program.   Duration: Be able to do 30 to 45 minutes of continuous aerobic exercise in addition to a 5 minute warm-up and a 5 minute cool-down routine.   Nutrition Goals: Your personal nutrition goals will be established when you do your  nutrition analysis with the dietician.  The following are general nutrition guidelines to follow: Cholesterol < 200mg /day Sodium < 1500mg /day Fiber: Men over 50 yrs - 30 grams per day  Personal Goals:  Personal Goals and Risk Factors at Admission - 05/20/24 1123       Core Components/Risk Factors/Patient Goals on Admission    Weight Management Weight Loss;Yes    Intervention Weight Management: Develop a combined nutrition and exercise program designed to reach desired caloric intake, while maintaining appropriate intake of nutrient and fiber, sodium and fats, and appropriate energy expenditure required for the weight goal.;Weight Management: Provide education and appropriate resources to help participant work on and attain dietary goals.;Weight Management/Obesity: Establish reasonable short term and long term weight goals.    Admit Weight 226 lb 8 oz (102.7 kg)    Goal Weight: Short Term 221 lb (100.2 kg)    Goal Weight: Long Term 216 lb (98 kg)    Expected Outcomes Short Term: Continue to assess and modify interventions until short term weight is achieved;Long Term: Adherence to nutrition and physical activity/exercise program aimed toward attainment of established weight goal;Weight Loss: Understanding of general recommendations for a balanced deficit meal plan, which promotes 1-2 lb weight  loss per week and includes a negative energy balance of [phone] kcal/d;Understanding recommendations for meals to include 15-35% energy as protein, 25-35% energy from fat, 35-60% energy from carbohydrates, less than 200mg  of dietary cholesterol, 20-35 gm of total fiber daily;Understanding of distribution of calorie intake throughout the day with the consumption of 4-5 meals/snacks    Improve shortness of breath with ADL's Yes    Intervention Provide education, individualized exercise plan and daily activity instruction to help decrease symptoms of SOB with activities of daily living.    Expected Outcomes  Short Term: Improve cardiorespiratory fitness to achieve a reduction of symptoms when performing ADLs;Long Term: Be able to perform more ADLs without symptoms or delay the onset of symptoms    Hypertension Yes    Intervention Provide education on lifestyle modifcations including regular physical activity/exercise, weight management, moderate sodium restriction and increased consumption of fresh fruit, vegetables, and low fat dairy, alcohol moderation, and smoking cessation.;Monitor prescription use compliance.    Expected Outcomes Short Term: Continued assessment and intervention until BP is < 140/68mm HG in hypertensive participants. < 130/48mm HG in hypertensive participants with diabetes, heart failure or chronic kidney disease.;Long Term: Maintenance of blood pressure at goal levels.    Lipids Yes    Intervention Provide education and support for participant on nutrition & aerobic/resistive exercise along with prescribed medications to achieve LDL 70mg , HDL >40mg .    Expected Outcomes Short Term: Participant states understanding of desired cholesterol values and is compliant with medications prescribed. Participant is following exercise prescription and nutrition guidelines.;Long Term: Cholesterol controlled with medications as prescribed, with individualized exercise RX and with personalized nutrition plan. Value goals: LDL < 70mg , HDL > 40 mg.             Tobacco Use Initial Evaluation: Social History   Tobacco Use  Smoking Status Former   Current packs/day: 0.50   Types: Cigarettes  Smokeless Tobacco Never    Exercise Goals and Review:  Exercise Goals     Row Name 05/20/24 1122             Exercise Goals   Increase Physical Activity Yes       Intervention Provide advice, education, support and counseling about physical activity/exercise needs.;Develop an individualized exercise prescription for aerobic and resistive training based on initial evaluation findings, risk  stratification, comorbidities and participant's personal goals.       Expected Outcomes Short Term: Attend rehab on a regular basis to increase amount of physical activity.;Long Term: Add in home exercise to make exercise part of routine and to increase amount of physical activity.;Long Term: Exercising regularly at least 3-5 days a week.       Increase Strength and Stamina Yes       Intervention Provide advice, education, support and counseling about physical activity/exercise needs.;Develop an individualized exercise prescription for aerobic and resistive training based on initial evaluation findings, risk stratification, comorbidities and participant's personal goals.       Expected Outcomes Short Term: Increase workloads from initial exercise prescription for resistance, speed, and METs.;Long Term: Improve cardiorespiratory fitness, muscular endurance and strength as measured by increased METs and functional capacity ( );Short Term: Perform resistance training exercises routinely during rehab and add in resistance training at home       Able to understand and use rate of perceived exertion (RPE) scale Yes       Intervention Provide education and explanation on how to use RPE scale       Expected  Outcomes Short Term: Able to use RPE daily in rehab to express subjective intensity level;Long Term:  Able to use RPE to guide intensity level when exercising independently       Able to understand and use Dyspnea scale Yes       Intervention Provide education and explanation on how to use Dyspnea scale       Expected Outcomes Short Term: Able to use Dyspnea scale daily in rehab to express subjective sense of shortness of breath during exertion;Long Term: Able to use Dyspnea scale to guide intensity level when exercising independently       Knowledge and understanding of Target Heart Rate Range (THRR) Yes       Intervention Provide education and explanation of THRR including how the numbers were predicted  and where they are located for reference       Expected Outcomes Short Term: Able to state/look up THRR;Short Term: Able to use daily as guideline for intensity in rehab;Long Term: Able to use THRR to govern intensity when exercising independently       Able to check pulse independently Yes       Intervention Provide education and demonstration on how to check pulse in carotid and radial arteries.;Review the importance of being able to check your own pulse for safety during independent exercise       Expected Outcomes Short Term: Able to explain why pulse checking is important during independent exercise;Long Term: Able to check pulse independently and accurately       Understanding of Exercise Prescription Yes       Intervention Provide education, explanation, and written materials on patient's individual exercise prescription       Expected Outcomes Short Term: Able to explain program exercise prescription;Long Term: Able to explain home exercise prescription to exercise independently              Copy of goals given to participant.

## 2024-05-21 LAB — COLOGUARD: COLOGUARD: POSITIVE — AB

## 2024-05-22 ENCOUNTER — Encounter (HOSPITAL_COMMUNITY)
Admission: RE | Admit: 2024-05-22 | Discharge: 2024-05-22 | Disposition: A | Discharge status: Home / Self Care | Source: Ambulatory Visit | Attending: Cardiovascular Disease | Admitting: Cardiovascular Disease

## 2024-05-22 DIAGNOSIS — Z951 Presence of aortocoronary bypass graft: Secondary | ICD-10-CM

## 2024-05-22 NOTE — Progress Notes (Signed)
 Daily Session Note  Patient Details  Name: Dave Williams MRN: [id number] Date of Birth: 06-Mar-1956 Referring Provider:   Flowsheet Row CARDIAC REHAB PHASE II ORIENTATION from 05/20/2024 in [facility] C[facility]  Referring Provider Lauro Portal MD       Encounter Date: 05/22/2024  Check In:  Session Check In - 05/22/24 0915       Check-In   Supervising physician immediately available to respond to emergencies See telemetry face sheet for immediately available MD    Location [facility]    Staff Present Ronna Coho BSN, RN;Heather Toy Freund, BS, Exercise Physiologist;Jessica [place], MA, RCEP, CCRP, CCET    Virtual Visit No    Medication changes reported     No    Fall or balance concerns reported    No    Tobacco Cessation No Change    Warm-up and Cool-down Performed on first and last piece of equipment    Resistance Training Performed Yes    VAD Patient? No    PAD/SET Patient? No      Pain Assessment   Currently in Pain? No/denies             Capillary Blood Glucose: No results found for this or any previous visit (from the past 24 hours).    Social History   Tobacco Use  Smoking Status Former   Current packs/day: 0.50   Types: Cigarettes  Smokeless Tobacco Never    Goals Met:  Independence with exercise equipment Exercise tolerated well No report of concerns or symptoms today Strength training completed today  Goals Unmet:  Not Applicable  Comments: First full day of exercise!  Patient was oriented to gym and equipment including functions, settings, policies, and procedures.  Patient's individual exercise prescription and treatment plan were reviewed.  All starting workloads were established based on the results of the 6 minute walk test done at initial orientation visit.  The plan for exercise progression was also introduced and progression will be customized based on patient's performance and goals.

## 2024-05-29 ENCOUNTER — Encounter (HOSPITAL_COMMUNITY)
Admission: RE | Admit: 2024-05-29 | Discharge: 2024-05-29 | Disposition: A | Discharge status: Home / Self Care | Source: Ambulatory Visit | Attending: Cardiovascular Disease

## 2024-05-29 DIAGNOSIS — Z951 Presence of aortocoronary bypass graft: Secondary | ICD-10-CM

## 2024-05-29 NOTE — Progress Notes (Signed)
 Daily Session Note  Patient Details  Name: Dave Williams MRN: [id number] Date of Birth: 02-06-1956 Referring Provider:   Flowsheet Row CARDIAC REHAB PHASE II ORIENTATION from 05/20/2024 in [facility] CARDIAC REH[facility]IL[facility]  Referring Provider Lauro Portal MD    Encounter Date: 05/29/2024  Check In:  Session Check In - 05/29/24 0915       Check-In   Supervising physician immediately available to respond to emergencies See telemetry face sheet for immediately available MD    Location [facility]    Staff Present Clotilda Danish, BS, Exercise Physiologist;Hillary Troutman BSN, RN;Brooke Rollin Clock, RN    Virtual Visit No    Medication changes reported     No    Fall or balance concerns reported    No    Tobacco Cessation No Change    Warm-up and Cool-down Performed on first and last piece of equipment    Resistance Training Performed Yes    VAD Patient? No    PAD/SET Patient? No      Pain Assessment   Currently in Pain? No/denies    Multiple Pain Sites No          Capillary Blood Glucose: No results found for this or any previous visit (from the past 24 hours).    Social History   Tobacco Use  Smoking Status Former   Current packs/day: 0.50   Types: Cigarettes  Smokeless Tobacco Never    Goals Met:  Independence with exercise equipment Exercise tolerated well No report of concerns or symptoms today Strength training completed today  Goals Unmet:  Not Applicable  Comments: Pt able to follow exercise prescription today without complaint.  Will continue to monitor for progression.

## 2024-05-29 NOTE — Progress Notes (Signed)
 Cardiac Individual Treatment Plan  Patient Details  Name: Dave Williams MRN: [id number] Date of Birth: [birth year] Referring Provider:   Flowsheet Row CARDIAC REHAB PHASE II ORIENTATION from 05/20/2024 in [place] [facility]  Referring Provider Lauro Portal MD    Initial Encounter Date:  Flowsheet Row CARDIAC REHAB PHASE II ORIENTATION from 05/20/2024 in [place] CARDIAC REHABILITATION  Date 05/20/24    Visit Diagnosis: No diagnosis found.  Patient's Home Medications on Admission:  Current Outpatient Medications:    amiodarone  (PACERONE ) 100 MG tablet, Take 1 tablet (100 mg total) by mouth daily., Disp: 90 tablet, Rfl: 3   amLODipine  (NORVASC ) 10 MG tablet, Take 1 tablet (10 mg total) by mouth daily., Disp: 90 tablet, Rfl: 2   apixaban  (ELIQUIS ) 5 MG TABS tablet, Take 1 tablet (5 mg total) by mouth 2 (two) times daily., Disp: 60 tablet, Rfl: 0   aspirin  EC 81 MG tablet, Take 1 tablet (81 mg total) by mouth daily. Swallow whole., Disp: 30 tablet, Rfl: 0   atorvastatin  (LIPITOR ) 80 MG tablet, Take 1 tablet (80 mg total) by mouth daily with supper., Disp: 90 tablet, Rfl: 3   HYDROcodone -acetaminophen  (NORCO/VICODIN) 5-325 MG tablet, Take 1 tablet by mouth every 6 (six) hours as needed for moderate pain (pain score 4-6)., Disp: 56 tablet, Rfl: 0   isosorbide  mononitrate (IMDUR ) 30 MG 24 hr tablet, Take 1 tablet (30 mg total) by mouth daily., Disp: 90 tablet, Rfl: 1   nicotine  (NICODERM CQ  - DOSED IN MG/24 HOURS) 14 mg/24hr patch, Place 1 patch (14 mg total) onto the skin daily. (Patient not taking: Reported on 05/16/2024), Disp: , Rfl:    nitroGLYCERIN  (NITROSTAT ) 0.4 MG SL tablet, Place 1 tablet (0.4 mg total) under the tongue every 5 (five) minutes as needed for chest pain. DISSOLVE 1 TABLET UNDER THE TONGUE EVERY 5 MINUTES AS NEEDED FOR CHEST PAIN. DO NOT EXCEED A TOTAL OF 3 DOSES IN 15 MINUTES., Disp: 25 tablet, Rfl: 6   pantoprazole  (PROTONIX ) 40 MG tablet, Take 1 tablet  (40 mg total) by mouth daily., Disp: 30 tablet, Rfl: 0   vitamin D3 (CHOLECALCIFEROL ) 25 MCG tablet, Take 1 tablet (1,000 Units total) by mouth daily., Disp: 30 tablet, Rfl: 0  Past Medical History: Past Medical History:  Diagnosis Date   Chronic left hip pain    Compression fracture of spine (HCC)    multiple compression Fx with disc bulge   Coronary artery disease    Hypertension    Pneumonia    As a teenager   Stroke [facility]) 2022   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    Tobacco Use: Social History   Tobacco Use  Smoking Status Former   Current packs/day: 0.50   Types: Cigarettes  Smokeless Tobacco Never    Labs: Review Flowsheet       Latest Ref Rng & Units 02/29/2024 03/04/2024 03/06/2024  Labs for ITP Cardiac and Pulmonary Rehab  Cholestrol 0 - 200 mg/dL - - 94   LDL (calc) 0 - 99 mg/dL - - 50   HDL-C >91 mg/dL - - 22   Trlycerides <478 mg/dL - - 295   Hemoglobin A2Z 4.8 - 5.6 % 5.8  - -  PH, Arterial 7.35 - 7.45 - 7.324  7.306  7.340  7.280  7.271  7.290  7.314  -  PCO2 arterial 32 - 48 mmHg - 41.3  40.1  43.4  32.8  49.2  48.8  51.8  -  Bicarbonate 20.0 - 28.0 mmol/L - 21.1  19.7  23.1  15.4  22.8  23.5  25.5  26.4  -  TCO2 22 - 32 mmol/L - 22  21  24  16  24  23  25  26  27  28  25  25   -  Acid-base deficit 0.0 - 2.0 mmol/L - 4.0  6.0  2.0  10.0  4.0  3.0  1.0  -  O2 Saturation % - 95  97  99  98  98  96  84  100  -    Details       Multiple values from one day are sorted in reverse-chronological order         Capillary Blood Glucose: Lab Results  Component Value Date   GLUCAP 113 (H) 03/08/2024   GLUCAP 121 (H) 03/07/2024   GLUCAP 112 (H) 03/07/2024   GLUCAP 111 (H) 03/07/2024   GLUCAP 136 (H) 03/06/2024     Exercise Target Goals: Exercise Program Goal: Individual exercise prescription set using results from initial 6 min walk test and THRR while considering  patient's activity  barriers and safety.   Exercise Prescription Goal: Starting with aerobic activity 30 plus minutes a day, 3 days per week for initial exercise prescription. Provide home exercise prescription and guidelines that participant acknowledges understanding prior to discharge.  Activity Barriers & Risk Stratification:  Activity Barriers & Cardiac Risk Stratification - 05/16/24 0921       Activity Barriers & Cardiac Risk Stratification   Activity Barriers Back Problems;Chest Pain/Angina;Shortness of Breath   Multiple back sugeries for back.   Cardiac Risk Stratification High          6 Minute Walk:  6 Minute Walk     Row Name 05/20/24 1119         6 Minute Walk   Phase Initial     Distance 1432 feet     Walk Time 6 minutes     # of Rest Breaks 0     MPH 2.71     METS 3.31     RPE 8     VO2 Peak 11.57     Symptoms No     Resting HR 69 bpm     Resting BP 110/70     Resting Oxygen Saturation  95 %     Exercise Oxygen Saturation  during 6 min walk 97 %     Max Ex. HR 99 bpm     Max Ex. BP 136/62     2 Minute Post BP 126/62        Oxygen Initial Assessment:   Oxygen Re-Evaluation:   Oxygen Discharge (Final Oxygen Re-Evaluation):   Initial Exercise Prescription:  Initial Exercise Prescription - 05/20/24 1100       Date of Initial Exercise RX and Referring Provider   Date 05/20/24    Referring Provider Lauro Portal MD      Oxygen   Maintain Oxygen Saturation 88% or higher      Treadmill   MPH 2.7    Grade 1    Minutes 15    METs 3.44      REL-XR   Level 13    Speed 50    Minutes 15    METs 3.4      Prescription Details   Frequency (times per week) 1   starting with one to meet  OOP   Duration Progress to 30 minutes of continuous aerobic without signs/symptoms of physical distress      Intensity   THRR 40-80% of Max Heartrate 102-137    Ratings of Perceived Exertion 11-13    Perceived Dyspnea 0-4      Progression   Progression Continue to  progress workloads to maintain intensity without signs/symptoms of physical distress.      Resistance Training   Training Prescription Yes    Weight 5 lb    Reps 10-15          Perform Capillary Blood Glucose checks as needed.  Exercise Prescription Changes:   Exercise Prescription Changes     Row Name 05/20/24 1100             Response to Exercise   Blood Pressure (Admit) 110/70       Blood Pressure (Exercise) 136/62       Blood Pressure (Exit) 126/62       Heart Rate (Admit) 69 bpm       Heart Rate (Exercise) 99 bpm       Heart Rate (Exit) 76 bpm       Oxygen Saturation (Admit) 95 %       Oxygen Saturation (Exercise) 97 %       Rating of Perceived Exertion (Exercise) 8       Symptoms none       Comments walk test results          Exercise Comments:   Exercise Comments     Row Name 05/22/24 0923           Exercise Comments First full day of exercise!  Patient was oriented to gym and equipment including functions, settings, policies, and procedures.  Patient's individual exercise prescription and treatment plan were reviewed.  All starting workloads were established based on the results of the 6 minute walk test done at initial orientation visit.  The plan for exercise progression was also introduced and progression will be customized based on patient's performance and goals.          Exercise Goals and Review:   Exercise Goals     Row Name 05/20/24 1122             Exercise Goals   Increase Physical Activity Yes       Intervention Provide advice, education, support and counseling about physical activity/exercise needs.;Develop an individualized exercise prescription for aerobic and resistive training based on initial evaluation findings, risk stratification, comorbidities and participant's personal goals.       Expected Outcomes Short Term: Attend rehab on a regular basis to increase amount of physical activity.;Long Term: Add in home exercise to make  exercise part of routine and to increase amount of physical activity.;Long Term: Exercising regularly at least 3-5 days a week.       Increase Strength and Stamina Yes       Intervention Provide advice, education, support and counseling about physical activity/exercise needs.;Develop an individualized exercise prescription for aerobic and resistive training based on initial evaluation findings, risk stratification, comorbidities and participant's personal goals.       Expected Outcomes Short Term: Increase workloads from initial exercise prescription for resistance, speed, and METs.;Long Term: Improve cardiorespiratory fitness, muscular endurance and strength as measured by increased METs and functional capacity ( );Short Term: Perform resistance training exercises routinely during rehab and add in resistance training at home       Able to understand and use rate  of perceived exertion (RPE) scale Yes       Intervention Provide education and explanation on how to use RPE scale       Expected Outcomes Short Term: Able to use RPE daily in rehab to express subjective intensity level;Long Term:  Able to use RPE to guide intensity level when exercising independently       Able to understand and use Dyspnea scale Yes       Intervention Provide education and explanation on how to use Dyspnea scale       Expected Outcomes Short Term: Able to use Dyspnea scale daily in rehab to express subjective sense of shortness of breath during exertion;Long Term: Able to use Dyspnea scale to guide intensity level when exercising independently       Knowledge and understanding of Target Heart Rate Range (THRR) Yes       Intervention Provide education and explanation of THRR including how the numbers were predicted and where they are located for reference       Expected Outcomes Short Term: Able to state/look up THRR;Short Term: Able to use daily as guideline for intensity in rehab;Long Term: Able to use THRR to govern  intensity when exercising independently       Able to check pulse independently Yes       Intervention Provide education and demonstration on how to check pulse in carotid and radial arteries.;Review the importance of being able to check your own pulse for safety during independent exercise       Expected Outcomes Short Term: Able to explain why pulse checking is important during independent exercise;Long Term: Able to check pulse independently and accurately       Understanding of Exercise Prescription Yes       Intervention Provide education, explanation, and written materials on patient's individual exercise prescription       Expected Outcomes Short Term: Able to explain program exercise prescription;Long Term: Able to explain home exercise prescription to exercise independently          Exercise Goals Re-Evaluation :    Discharge Exercise Prescription (Final Exercise Prescription Changes):  Exercise Prescription Changes - 05/20/24 1100       Response to Exercise   Blood Pressure (Admit) 110/70    Blood Pressure (Exercise) 136/62    Blood Pressure (Exit) 126/62    Heart Rate (Admit) 69 bpm    Heart Rate (Exercise) 99 bpm    Heart Rate (Exit) 76 bpm    Oxygen Saturation (Admit) 95 %    Oxygen Saturation (Exercise) 97 %    Rating of Perceived Exertion (Exercise) 8    Symptoms none    Comments walk test results          Nutrition:  Target Goals: Understanding of nutrition guidelines, daily intake of sodium 1500mg , cholesterol 200mg , calories 30% from fat and 7% or less from saturated fats, daily to have 5 or more servings of fruits and vegetables.  Biometrics:  Pre Biometrics - 05/20/24 1122       Pre Biometrics   Height 6' 1 (1.854 m)    Weight 102.7 kg    Waist Circumference 31 inches    Hip Circumference 44 inches    Waist to Hip Ratio 0.7 %    BMI (Calculated) 29.89    Grip Strength 32.7 kg    Single Leg Stand 28.2 seconds           Nutrition Therapy  Plan and Nutrition Goals:  Nutrition Assessments:  MEDIFICTS Score Key: >=70 Need to make dietary changes  40-70 Heart Healthy Diet <= 40 Therapeutic Level Cholesterol Diet  Flowsheet Row CARDIAC REHAB PHASE II ORIENTATION from 05/20/2024 in [facility]  Picture Your Plate Total Score on Admission 30   Picture Your Plate Scores: <16 Unhealthy dietary pattern with much room for improvement. 41-50 Dietary pattern unlikely to meet recommendations for good health and room for improvement. 51-60 More healthful dietary pattern, with some room for improvement.  >60 Healthy dietary pattern, although there may be some specific behaviors that could be improved.    Nutrition Goals Re-Evaluation:   Nutrition Goals Discharge (Final Nutrition Goals Re-Evaluation):   Psychosocial: Target Goals: Acknowledge presence or absence of significant depression and/or stress, maximize coping skills, provide positive support system. Participant is able to verbalize types and ability to use techniques and skills needed for reducing stress and depression.  Initial Review & Psychosocial Screening:  Initial Psych Review & Screening - 05/16/24 0944       Initial Review   Current issues with Current Sleep Concerns      Family Dynamics   Good Support System? Yes      Barriers   Psychosocial barriers to participate in program The patient should benefit from training in stress management and relaxation.;There are no identifiable barriers or psychosocial needs.      Screening Interventions   Interventions To provide support and resources with identified psychosocial needs;Encouraged to exercise;Provide feedback about the scores to participant    Expected Outcomes Short Term goal: Utilizing psychosocial counselor, staff and physician to assist with identification of specific Stressors or current issues interfering with healing process. Setting desired goal for each stressor or current  issue identified.;Long Term Goal: Stressors or current issues are controlled or eliminated.;Short Term goal: Identification and review with participant of any Quality of Life or Depression concerns found by scoring the questionnaire.;Long Term goal: The participant improves quality of Life and PHQ9 Scores as seen by post scores and/or verbalization of changes          Quality of Life Scores:  Quality of Life - 05/20/24 1123       Quality of Life   Select Quality of Life      Quality of Life Scores   Health/Function Pre 19.2 %    Socioeconomic Pre 26.57 %    Psych/Spiritual Pre 12.93 %    Family Pre 27.5 %    GLOBAL Pre 20.03 %         Scores of 19 and below usually indicate a poorer quality of life in these areas.  A difference of  2-3 points is a clinically meaningful difference.  A difference of 2-3 points in the total score of the Quality of Life Index has been associated with significant improvement in overall quality of life, self-image, physical symptoms, and general health in studies assessing change in quality of life.  PHQ-9: Review Flowsheet       05/20/2024  Depression screen PHQ 2/9  Decreased Interest 1  Down, Depressed, Hopeless 1  PHQ - 2 Score 2  Altered sleeping 2  Tired, decreased energy 3  Change in appetite 0  Feeling bad or failure about yourself  1  Trouble concentrating 0  Moving slowly or fidgety/restless 1  Suicidal thoughts 0  PHQ-9 Score 9  Difficult doing work/chores Not difficult at all   Interpretation of Total Score  Total Score Depression Severity:  1-4 = Minimal depression, 5-9 =  Mild depression, 10-14 = Moderate depression, 15-19 = Moderately severe depression, 20-27 = Severe depression   Psychosocial Evaluation and Intervention:  Psychosocial Evaluation - 05/16/24 0945       Psychosocial Evaluation & Interventions   Interventions Stress management education;Relaxation education;Encouraged to exercise with the program and follow  exercise prescription    Comments 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 He denies any depression, anxiety or stressors. He does report some difficulty staying asleep and has been taking Advil PM long term for sleep. He lives alone. He says he has friends and neigbors that would help him out if needed. 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 He has a $25 copayment and is about to meet his out of pocket maximun which will end the copayment. He is only going to do 1 day/week until he meets the OOP and then go to 3 days/week. His goals for the program are to get his stamina back; improve his muscle tone and get back in shape overall. He has no barriers identified to complete the program.    Expected Outcomes Short Term: Patient will start the program and attend consistently. Long Term: Patient will complete the program meeting personal goals.    Continue Psychosocial Services  Follow up required by staff          Psychosocial Re-Evaluation:   Psychosocial Discharge (Final Psychosocial Re-Evaluation):   Vocational Rehabilitation: Provide vocational rehab assistance to qualifying candidates.   Vocational Rehab Evaluation & Intervention:  Vocational Rehab - 05/16/24 6213       Initial Vocational Rehab Evaluation & Intervention   Assessment shows need for Vocational Rehabilitation No      Vocational Rehab Re-Evaulation   Comments Disabled.          Education: Education Goals: Education classes will be provided on a weekly basis, covering required topics. Participant will state understanding/return demonstration of topics presented.  Learning Barriers/Preferences:  Learning Barriers/Preferences - 05/16/24 0865       Learning Barriers/Preferences   Learning Barriers None    Learning Preferences Written Material;Audio;Skilled Demonstration          Education Topics: Hypertension, Hypertension Reduction -Define heart  disease and high blood pressure. Discus how high blood pressure affects the body and ways to reduce high blood pressure.   Exercise and Your Heart -Discuss why it is important to exercise, the FITT principles of exercise, normal and abnormal responses to exercise, and how to exercise safely.   Angina -Discuss definition of angina, causes of angina, treatment of angina, and how to decrease risk of having angina. Flowsheet Row CARDIAC REHAB PHASE II EXERCISE from 05/22/2024 in [place] CARDIAC REHABILITATION  Date 05/22/24  Educator [facility]  Instruction Review Code 1- Verbalizes Understanding    Cardiac Medications -Review what the following cardiac medications are used for, how they affect the body, and side effects that may occur when taking the medications.  Medications include Aspirin , Beta blockers, calcium  channel blockers, ACE Inhibitors, angiotensin receptor blockers, diuretics, digoxin, and antihyperlipidemics.   Congestive Heart Failure -Discuss the definition of CHF, how to live with CHF, the signs and symptoms of CHF, and how keep track of weight and sodium intake.   Heart Disease and Intimacy -Discus the effect sexual activity has on the heart, how changes occur during intimacy as we age, and safety during sexual activity.   Smoking Cessation / COPD -Discuss different methods to quit smoking, the health  benefits of quitting smoking, and the definition of COPD.   Nutrition I: Fats -Discuss the types of cholesterol, what cholesterol does to the heart, and how cholesterol levels can be controlled.   Nutrition II: Labels -Discuss the different components of food labels and how to read food label   Heart Parts/Heart Disease and PAD -Discuss the anatomy of the heart, the pathway of blood circulation through the heart, and these are affected by heart disease.   Stress I: Signs and Symptoms -Discuss the causes of stress, how stress may lead to anxiety and depression, and  ways to limit stress.   Stress II: Relaxation -Discuss different types of relaxation techniques to limit stress.   Warning Signs of Stroke / TIA -Discuss definition of a stroke, what the signs and symptoms are of a stroke, and how to identify when someone is having stroke.   Knowledge Questionnaire Score:  Knowledge Questionnaire Score - 05/20/24 1043       Knowledge Questionnaire Score   Pre Score 25/28          Core Components/Risk Factors/Patient Goals at Admission:  Personal Goals and Risk Factors at Admission - 05/20/24 1123       Core Components/Risk Factors/Patient Goals on Admission    Weight Management Weight Loss;Yes    Intervention Weight Management: Develop a combined nutrition and exercise program designed to reach desired caloric intake, while maintaining appropriate intake of nutrient and fiber, sodium and fats, and appropriate energy expenditure required for the weight goal.;Weight Management: Provide education and appropriate resources to help participant work on and attain dietary goals.;Weight Management/Obesity: Establish reasonable short term and long term weight goals.    Admit Weight 226 lb 8 oz (102.7 kg)    Goal Weight: Short Term 221 lb (100.2 kg)    Goal Weight: Long Term 216 lb (98 kg)    Expected Outcomes Short Term: Continue to assess and modify interventions until short term weight is achieved;Long Term: Adherence to nutrition and physical activity/exercise program aimed toward attainment of established weight goal;Weight Loss: Understanding of general recommendations for a balanced deficit meal plan, which promotes 1-2 lb weight loss per week and includes a negative energy balance of [phone] kcal/d;Understanding recommendations for meals to include 15-35% energy as protein, 25-35% energy from fat, 35-60% energy from carbohydrates, less than 200mg  of dietary cholesterol, 20-35 gm of total fiber daily;Understanding of distribution of calorie intake  throughout the day with the consumption of 4-5 meals/snacks    Improve shortness of breath with ADL's Yes    Intervention Provide education, individualized exercise plan and daily activity instruction to help decrease symptoms of SOB with activities of daily living.    Expected Outcomes Short Term: Improve cardiorespiratory fitness to achieve a reduction of symptoms when performing ADLs;Long Term: Be able to perform more ADLs without symptoms or delay the onset of symptoms    Hypertension Yes    Intervention Provide education on lifestyle modifcations including regular physical activity/exercise, weight management, moderate sodium restriction and increased consumption of fresh fruit, vegetables, and low fat dairy, alcohol moderation, and smoking cessation.;Monitor prescription use compliance.    Expected Outcomes Short Term: Continued assessment and intervention until BP is < 140/2mm HG in hypertensive participants. < 130/75mm HG in hypertensive participants with diabetes, heart failure or chronic kidney disease.;Long Term: Maintenance of blood pressure at goal levels.    Lipids Yes    Intervention Provide education and support for participant on nutrition & aerobic/resistive exercise along with prescribed medications to  achieve LDL 70mg , HDL >40mg .    Expected Outcomes Short Term: Participant states understanding of desired cholesterol values and is compliant with medications prescribed. Participant is following exercise prescription and nutrition guidelines.;Long Term: Cholesterol controlled with medications as prescribed, with individualized exercise RX and with personalized nutrition plan. Value goals: LDL < 70mg , HDL > 40 mg.          Core Components/Risk Factors/Patient Goals Review:    Core Components/Risk Factors/Patient Goals at Discharge (Final Review):    ITP Comments:  ITP Comments     Row Name 05/16/24 0952 05/20/24 1143 05/22/24 0923 05/29/24 0928     ITP Comments Virtual  orientation visit completed for Cardiac rehab with S/P CABGx3. On-site orientation visit scheduled for 05/20/24 at 10. Patient arrived for 1st visit/orientation/education at 1000. Patient was referred to CR by Dr. Starleen Eastern and attending cardiologist Peter [place] due to S/P CABGx3. During orientation advised patient on arrival and appointment times what to wear, what to do before, during and after exercise. Reviewed attendance and class policy.  Pt is scheduled to return Cardiac Rehab on 05/22/24 at 915. Pt was advised to come to class 15 minutes before class starts.  Discussed RPE/Dpysnea scales. Patient participated in warm up stretches. Patient was able to complete 6 minute walk test.  Telemetry:NSR. Patient was measured for the equipment. Discussed equipment safety with patient. Took patient pre-anthropometric measurements. Patient finished visit at 1045. First full day of exercise!  Patient was oriented to gym and equipment including functions, settings, policies, and procedures.  Patient's individual exercise prescription and treatment plan were reviewed.  All starting workloads were established based on the results of the 6 minute walk test done at initial orientation visit.  The plan for exercise progression was also introduced and progression will be customized based on patient's performance and goals. 30 day review completed. ITP sent to Dr.Jonathan Branch, Medical Director of Cardiac Rehab. Continue with ITP unless changes are made by physician. New to program. He has completed 1 session since his orientation visit.       Comments: 30 Day Review

## 2024-05-30 ENCOUNTER — Other Ambulatory Visit (HOSPITAL_COMMUNITY): Payer: Self-pay | Admitting: Family Medicine

## 2024-05-30 DIAGNOSIS — R221 Localized swelling, mass and lump, neck: Secondary | ICD-10-CM

## 2024-06-04 ENCOUNTER — Encounter (INDEPENDENT_AMBULATORY_CARE_PROVIDER_SITE_OTHER): Payer: Self-pay | Admitting: *Deleted

## 2024-06-05 ENCOUNTER — Ambulatory Visit (HOSPITAL_COMMUNITY)
Admission: RE | Admit: 2024-06-05 | Discharge: 2024-06-05 | Disposition: A | Discharge status: Home / Self Care | Source: Ambulatory Visit | Attending: Family Medicine | Admitting: Family Medicine

## 2024-06-05 ENCOUNTER — Encounter (HOSPITAL_COMMUNITY)
Admission: RE | Admit: 2024-06-05 | Discharge: 2024-06-05 | Disposition: A | Discharge status: Home / Self Care | Source: Ambulatory Visit | Attending: Cardiovascular Disease | Admitting: Cardiovascular Disease

## 2024-06-05 DIAGNOSIS — Z951 Presence of aortocoronary bypass graft: Secondary | ICD-10-CM | POA: Diagnosis not present

## 2024-06-05 DIAGNOSIS — R221 Localized swelling, mass and lump, neck: Secondary | ICD-10-CM | POA: Insufficient documentation

## 2024-06-05 NOTE — Progress Notes (Signed)
 Daily Session Note  Patient Details  Name: Jakorian Marengo MRN: [id number] Date of Birth: [birth year] Referring Provider:   Flowsheet Row CARDIAC REHAB PHASE II ORIENTATION from 05/20/2024 in [facility] CARDIAC REHABILITATION  Referring Provider Court Carrier MD    Encounter Date: 06/05/2024  Check In:  Session Check In - 06/05/24 0915       Check-In   Supervising physician immediately available to respond to emergencies See telemetry face sheet for immediately available MD    Location [facility]    Staff Present Adrien Louder, RN, BSN;Heather Con, BS, Exercise Physiologist   Myrick Browner, [place]   Virtual Visit No    Medication changes reported     No    Fall or balance concerns reported    No    Warm-up and Cool-down Performed on first and last piece of equipment    Resistance Training Performed Yes    VAD Patient? No    PAD/SET Patient? No      Pain Assessment   Currently in Pain? No/denies    Multiple Pain Sites No          Capillary Blood Glucose: No results found for this or any previous visit (from the past 24 hours).    Social History   Tobacco Use  Smoking Status Former   Current packs/day: 0.50   Types: Cigarettes  Smokeless Tobacco Never    Goals Met:  Independence with exercise equipment Exercise tolerated well No report of concerns or symptoms today Strength training completed today  Goals Unmet:  Not Applicable  Comments: Pt able to follow exercise prescription today without complaint.  Will continue to monitor for progression.

## 2024-06-10 ENCOUNTER — Ambulatory Visit (INDEPENDENT_AMBULATORY_CARE_PROVIDER_SITE_OTHER): Admitting: Gastroenterology

## 2024-06-10 ENCOUNTER — Encounter (INDEPENDENT_AMBULATORY_CARE_PROVIDER_SITE_OTHER): Payer: Self-pay | Admitting: Gastroenterology

## 2024-06-10 ENCOUNTER — Other Ambulatory Visit (HOSPITAL_COMMUNITY): Payer: Self-pay | Admitting: Family Medicine

## 2024-06-10 VITALS — BP 122/67 | HR 67 | Temp 98.1°F | Ht 73.0 in | Wt 232.9 lb

## 2024-06-10 DIAGNOSIS — R195 Other fecal abnormalities: Secondary | ICD-10-CM | POA: Diagnosis not present

## 2024-06-10 DIAGNOSIS — R590 Localized enlarged lymph nodes: Secondary | ICD-10-CM

## 2024-06-10 DIAGNOSIS — K649 Unspecified hemorrhoids: Secondary | ICD-10-CM

## 2024-06-10 NOTE — Progress Notes (Signed)
 Referring Provider: Myra Geni ORN, FNP Primary Care Physician:  Myra Geni ORN, FNP Primary GI Physician: New (Dr. Eartha)   Chief Complaint  Patient presents with   Colonoscopy    Postitive cologuard   HPI:   Dave Williams is a 68 y.o. 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   Patient presenting today as a new patient for: Positive cologuard   Last hgb in April was 12.1, notably as low as 9.9 prior to that (recent CABG)  Patient reports he had recent cologuard that was positive. He had CABG on 3/24. SABRA He was diagnosed with A fib with RVR after his CABG. He was started on eliquis  after his CABG. Currently doing cardiac rehab.   He notes that he has a growth on his right neck that is currently being worked up right now (recent US  concerning for metastatic head/neck cancer vs. Lymphoma). He is starting to have some issues with swallowing due to the mass on his neck, he also notes some soreness of his throat upon waking in the morning.   Denies any changes in bowel habits, no constipation or diarrhea. Stools are soft and easy to pass. Has some gas at times but no abdominal pain. He denies changes in appetite or weight loss. Reports history of hemorrhoids that sometimes itch and bleed once he messes with them, otherwise no blood in stools or melena.  \  POSITIVE COLOGUARD ON 05/16/24  NSAID use: sometimes take an ibuprofen at night  Social hx: no etoh, last smoked march 24th, 2025 Fam hx:  no CRC or liver disease, father had esophageal cancer at age 30, mother had anal cancer, not sure of age.   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    Past Surgical History:  Procedure Laterality Date   BACK SURGERY     Radiocartography T2-T12   CORONARY ARTERY BYPASS GRAFT N/A 03/04/2024   Procedure: CORONARY ARTERY BYPASS GRAFTING (CABG) TIMES THREE UTILIZING LEFT INTERNAL MAMMARY ARTERY AND ENDOSCOPIC VEIN HARVEST RIGHT GREATER SAPHENOUS VEIN;  Surgeon: Shyrl Linnie KIDD, MD;  Location: [facility] OR;  Service: Open Heart Surgery;  Laterality: N/A;   KNEE ARTHROSCOPY Bilateral    LEFT HEART CATH AND CORONARY ANGIOGRAPHY N/A 11/28/2023   Procedure: LEFT HEART CATH AND CORONARY ANGIOGRAPHY;  Surgeon: Swazil[place], Peter M, MD;  Location: [facility] INVASIVE CV LAB;  Service: Cardiovascular;  Laterality: N/A;   PILONIDAL CYST EXCISION     In patients 20s   TEE WITHOUT CARDIOVERSION N/A 03/04/2024   Procedure: ECHOCARDIOGRAM, TRANSESOPHAGEAL;  Surgeon: Shyrl Linnie KIDD, MD;  Location: [facility] OR;  Service: Open Heart Surgery;  Laterality: N/A;    Current Outpatient Medications  Medication Sig Dispense Refill   amiodarone  (PACERONE ) 200 MG tablet Take 1 tablet by mouth daily.  amLODipine  (NORVASC ) 10 MG tablet Take 1 tablet (10 mg total) by mouth daily. 90 tablet 2   apixaban  (ELIQUIS ) 5 MG TABS tablet Take 1 tablet (5 mg total) by mouth 2 (two) times daily. 60 tablet 0   aspirin  EC 81 MG tablet Take 1 tablet (81 mg total) by mouth daily. Swallow whole. 30 tablet 0   atorvastatin  (LIPITOR ) 80 MG tablet Take 1 tablet (80 mg total) by mouth daily with supper. 90 tablet 3   HYDROcodone -acetaminophen  (NORCO/VICODIN) 5-325 MG tablet Take 1 tablet by mouth every 6 (six) hours as needed for moderate pain (pain score 4-6). 56 tablet 0   isosorbide  mononitrate (IMDUR ) 30 MG 24 hr tablet Take 1 tablet (30 mg total) by mouth daily. 90 tablet 1    nitroGLYCERIN  (NITROSTAT ) 0.4 MG SL tablet Place 1 tablet (0.4 mg total) under the tongue every 5 (five) minutes as needed for chest pain. DISSOLVE 1 TABLET UNDER THE TONGUE EVERY 5 MINUTES AS NEEDED FOR CHEST PAIN. DO NOT EXCEED A TOTAL OF 3 DOSES IN 15 MINUTES. 25 tablet 6   pantoprazole  (PROTONIX ) 40 MG tablet Take 1 tablet (40 mg total) by mouth daily. 30 tablet 0   vitamin D3 (CHOLECALCIFEROL ) 25 MCG tablet Take 1 tablet (1,000 Units total) by mouth daily. 30 tablet 0   No current facility-administered medications for this visit.    Allergies as of 06/10/2024   (No Known Allergies)    Social History   Socioeconomic History   Marital status: Single    Spouse name: Not on file   Number of children: Not on file   Years of education: Not on file   Highest education level: Not on file  Occupational History   Not on file  Tobacco Use   Smoking status: Former    Current packs/day: 0.50    Types: Cigarettes   Smokeless tobacco: Never  Vaping Use   Vaping status: Never Used  Substance and Sexual Activity   Alcohol use: No    Comment: Sober for 20 yeras as of 02/2024   Drug use: Not Currently    Types: Crack cocaine, Marijuana    Comment: Used for 30+ years but quit in 1990s and has not picked up since   Sexual activity: Yes  Other Topics Concern   Not on file  Social History Narrative   Not on file   Social Drivers of Health   Financial Resource Strain: Not on file  Food Insecurity: No Food Insecurity (03/05/2024)   Hunger Vital Sign    Worried About Running Out of Food in the Last Year: Never true    Ran Out of Food in the Last Year: Never true  Transportation Needs: No Transportation Needs (03/05/2024)   PRAPARE - Administrator, Civil Service (Medical): No    Lack of Transportation (Non-Medical): No  Physical Activity: Not on file  Stress: Not on file  Social Connections: Moderately Integrated (03/05/2024)   Social Connection and Isolation Panel     Frequency of Communication with Friends and Family: More than three times a week    Frequency of Social Gatherings with Friends and Family: More than three times a week    Attends Religious Services: More than 4 times per year    Active Member of [employer] or Organizations: Yes    Attends Banker Meetings: 1 to 4 times per year    Marital Status: Divorced    Review of systems General: negative for malaise,  night sweats, fever, chills, weight loss Neck: +large mass to Right neck  Resp: Negative for cough, wheezing, dyspnea at rest CV: Negative for chest pain, leg swelling, palpitations, orthopnea GI: denies melena, nausea, vomiting, diarrhea, constipation, dysphagia, odyonophagia, early satiety or unintentional weight loss. +occasional bleeding/itching of hemorrhoids MSK: Negative for joint pain or swelling, back pain, and muscle pain. Derm: Negative for itching or rash Psych: Denies depression, anxiety, memory loss, confusion. No homicidal or suicidal ideation.  Heme: Negative for prolonged bleeding, bruising easily, and swollen nodes. Endocrine: Negative for cold or heat intolerance, polyuria, polydipsia and goiter. Neuro: negative for tremor, gait imbalance, syncope and seizures. The remainder of the review of systems is noncontributory.  Physical Exam: BP 122/67 (BP Location: Right Arm, Patient Position: Sitting, Cuff Size: Normal)   Pulse 67   Temp 98.1 F (36.7 C) (Oral)   Ht 6' 1 (1.854 m)   Wt 232 lb 14.4 oz (105.6 kg)   SpO2 94%   BMI 30.73 kg/m  General:   Alert and oriented. No distress noted. Pleasant and cooperative.  Head/neck:  Normocephalic and atraumatic. Large mass to R neck Eyes:  Conjuctiva clear without scleral icterus. Mouth:  Oral mucosa pink and moist. Good dentition. No lesions. Heart: Normal rate and rhythm, s1 and s2 heart sounds present.  Lungs: Clear lung sounds in all lobes. Respirations equal and unlabored. Abdomen:  +BS, soft, non-tender  and non-distended. No rebound or guarding. No HSM or masses noted. Derm: No palmar erythema or jaundice Msk:  Symmetrical without gross deformities. Normal posture. Extremities:  Without edema. Neurologic:  Alert and  oriented x4 Psych:  Alert and cooperative. Normal mood and affect.  Invalid input(s): 6 MONTHS   ASSESSMENT: Dave Williams is a 68 y.o. male presenting today as a new patient for positive cologuard  Patient with recent positive cologuard, last colonoscopy in his 87s. He has no changes in bowel habits, abdominal pain, melena, or weight loss. Notes occasional bleeding from hemorrhoids when they become pruritic/irritated. Notably he is being worked up currently for mass to his Right neck with recent US  concerning for metastatic neck/head cancer vs. Lymphoma, has upcoming CT for further evaluation of this. He also had recent CABG, new diagnosis of A fib for which he is on eliquis  for. Has follow up with cards in August and prefers to hold off on colonoscopy at the moment given other health issues. I discussed importance of proceeding with colonoscopy in a timely manner, especially in presence of possible malignant lesion in his neck vs. Lymphoma. We will reach out to him in about 2 months to see if he is ready to schedule though I advised him to let me know if he is ready to schedule sooner.    PLAN:  - will schedule colonoscopy once patient is ready, touch base in 2 months to see if we can get this scheduled  All questions were answered, patient verbalized understanding and is in agreement with plan as outlined above.   Follow Up: TBD  Jing Howatt L. Mariette, MSN, APRN, AGNP-C Adult-Gerontology Nurse Practitioner [facility]  I have reviewed the note and agree with the APP's assessment as described in this progress note  Toribio Fortune, MD Gastroenterology and Hepatology [facility]

## 2024-06-10 NOTE — Patient Instructions (Signed)
 We will touch base again in about 2 months regarding colonoscopy As discussed, it would be important to not wait too long to schedule this.If you decide you are ready to proceed prior to us  touching base, please give us  a call  It was a pleasure to see you today. I want to create trusting relationships with patients and provide genuine, compassionate, and quality care. I truly value your feedback! please be on the lookout for a survey regarding your visit with me today. I appreciate your input about our visit and your time in completing this!    Dave Williams L. Jerome Viglione, MSN, APRN, AGNP-C Adult-Gerontology Nurse Practitioner [facility]

## 2024-06-12 ENCOUNTER — Encounter (HOSPITAL_COMMUNITY)
Admission: RE | Admit: 2024-06-12 | Discharge: 2024-06-12 | Disposition: A | Discharge status: Home / Self Care | Source: Ambulatory Visit | Attending: Cardiovascular Disease | Admitting: Cardiovascular Disease

## 2024-06-12 ENCOUNTER — Telehealth: Payer: Self-pay | Admitting: Orthopaedic Surgery

## 2024-06-12 DIAGNOSIS — R590 Localized enlarged lymph nodes: Secondary | ICD-10-CM | POA: Diagnosis present

## 2024-06-12 DIAGNOSIS — Z951 Presence of aortocoronary bypass graft: Secondary | ICD-10-CM | POA: Insufficient documentation

## 2024-06-12 DIAGNOSIS — Z8673 Personal history of transient ischemic attack (TIA), and cerebral infarction without residual deficits: Secondary | ICD-10-CM | POA: Insufficient documentation

## 2024-06-12 DIAGNOSIS — I480 Paroxysmal atrial fibrillation: Secondary | ICD-10-CM | POA: Insufficient documentation

## 2024-06-12 DIAGNOSIS — J387 Other diseases of larynx: Secondary | ICD-10-CM | POA: Insufficient documentation

## 2024-06-12 DIAGNOSIS — I10 Essential (primary) hypertension: Secondary | ICD-10-CM | POA: Diagnosis not present

## 2024-06-12 DIAGNOSIS — Z87891 Personal history of nicotine dependence: Secondary | ICD-10-CM | POA: Insufficient documentation

## 2024-06-12 DIAGNOSIS — Z7901 Long term (current) use of anticoagulants: Secondary | ICD-10-CM | POA: Insufficient documentation

## 2024-06-12 NOTE — Progress Notes (Signed)
 Daily Session Note  Patient Details  Name: Calen Geister MRN: [id number] Date of Birth: 10/18/1956 Referring Provider:   Flowsheet Row CARDIAC REHAB PHASE II ORIENTATION from 05/20/2024 in [facility] CARDIAC REHABILITATION  Referring Provider Court Carrier MD    Encounter Date: 06/12/2024  Check In:  Session Check In - 06/12/24 0900       Check-In   Supervising physician immediately available to respond to emergencies See telemetry face sheet for immediately available MD    Location [facility]    Staff Present Adrien Louder, RN, BSN;Heather Con, BS, Exercise Physiologist;Jessica Vonzell, MA, RCEP, CCRP, CCET    Virtual Visit No    Medication changes reported     No    Fall or balance concerns reported    No    Warm-up and Cool-down Performed on first and last piece of equipment    Resistance Training Performed Yes    VAD Patient? No    PAD/SET Patient? No      Pain Assessment   Currently in Pain? No/denies    Multiple Pain Sites No          Capillary Blood Glucose: No results found for this or any previous visit (from the past 24 hours).    Social History   Tobacco Use  Smoking Status Former   Current packs/day: 0.50   Types: Cigarettes  Smokeless Tobacco Never    Goals Met:  Independence with exercise equipment Exercise tolerated well No report of concerns or symptoms today Strength training completed today  Goals Unmet:  Not Applicable  Comments: Pt able to follow exercise prescription today without complaint.  Will continue to monitor for progression.

## 2024-06-16 ENCOUNTER — Ambulatory Visit (HOSPITAL_BASED_OUTPATIENT_CLINIC_OR_DEPARTMENT_OTHER)
Admission: RE | Admit: 2024-06-16 | Discharge: 2024-06-16 | Disposition: A | Discharge status: Home / Self Care | Source: Ambulatory Visit | Attending: Family Medicine | Admitting: Family Medicine

## 2024-06-16 DIAGNOSIS — R590 Localized enlarged lymph nodes: Secondary | ICD-10-CM | POA: Diagnosis present

## 2024-06-16 LAB — POCT I-STAT CREATININE: Creatinine, Ser: 1.2 mg/dL (ref 0.61–1.24)

## 2024-06-16 MED ORDER — IOHEXOL 300 MG/ML  SOLN
80.0000 mL | Freq: Once | INTRAMUSCULAR | Status: AC | PRN
  Administered 2024-06-16: 80 mL via INTRAVENOUS

## 2024-06-17 ENCOUNTER — Encounter (HOSPITAL_COMMUNITY)
Admission: RE | Admit: 2024-06-17 | Discharge: 2024-06-17 | Disposition: A | Discharge status: Home / Self Care | Source: Ambulatory Visit | Attending: Cardiovascular Disease

## 2024-06-17 DIAGNOSIS — R590 Localized enlarged lymph nodes: Secondary | ICD-10-CM | POA: Diagnosis not present

## 2024-06-17 DIAGNOSIS — Z951 Presence of aortocoronary bypass graft: Secondary | ICD-10-CM

## 2024-06-17 NOTE — Progress Notes (Signed)
 Daily Session Note  Patient Details  Name: Dave Williams MRN: [id number] Date of Birth: 03/02/1956 Referring Provider:   Flowsheet Row CARDIAC REHAB PHASE II ORIENTATION from 05/20/2024 in [facility] CARDIAC REHABILITATION  Referring Provider Court Carrier MD    Encounter Date: 06/17/2024  Check In:  Session Check In - 06/17/24 0915       Check-In   Supervising physician immediately available to respond to emergencies See telemetry face sheet for immediately available MD    Location [facility]    Staff Present Laymon Rattler, BSN, RN, Rosalba Gelineau, MA, RCEP, CCRP, CCET    Virtual Visit No    Medication changes reported     No    Fall or balance concerns reported    No    Tobacco Cessation No Change    Warm-up and Cool-down Performed on first and last piece of equipment    Resistance Training Performed Yes    VAD Patient? No    PAD/SET Patient? No      Pain Assessment   Currently in Pain? No/denies          Capillary Blood Glucose: Results for orders placed or performed during the hospital encounter of 06/16/24 (from the past 24 hours)  I-STAT creatinine     Status: None   Collection Time: 06/16/24 11:00 AM  Result Value Ref Range   Creatinine, Ser 1.20 0.61 - 1.24 mg/dL      Social History   Tobacco Use  Smoking Status Former   Current packs/day: 0.50   Types: Cigarettes  Smokeless Tobacco Never    Goals Met:  Independence with exercise equipment Exercise tolerated well No report of concerns or symptoms today Strength training completed today  Goals Unmet:  Not Applicable  Comments: Pt able to follow exercise prescription today without complaint.  Will continue to monitor for progression.

## 2024-06-18 ENCOUNTER — Encounter: Payer: Self-pay | Admitting: Medical Oncology

## 2024-06-18 NOTE — Progress Notes (Signed)
 [facility] [phone]   [phone]  INITIAL CONSULTATION:  Patient Care Team: Myra Geni ORN, FNP as PCP - General (Family Medicine) Court Dorn PARAS, MD as PCP - Cardiology (Cardiology)  CHIEF COMPLAINTS/PURPOSE OF CONSULTATION:  lymphadenopathy  HISTORY OF PRESENTING ILLNESS:  Dave Williams 68 y.o. male with medical history significant for cerebrovascular accident, paroxysmal atrial fibrillation on Eliquis , s/p CABG x 3 in 02/2024, hypertension.  On review of the previous records patient is being referred by PCP Geni Myra FNP for lymphadenopathyW. Patient had US  neck on 06/05/24 showing enlarged lymph nodes in right superior cervical chain measuring 5.1 x 2.1 x 3.0 cm and 2.5 x 1.4 x 1.6 cm. CT soft tissue neck was performed on 06/16/24 showing 2 cm supraglottic mass to the right of midline consistent with a malignancy and bulky malignant lymphadenopathy on the right at level 2 and level 3. Further chart review shows patient established care with GI on 06/10/24 after having a positive cologuard test. It was recommended patient have colonoscopy however he prefers to hold off while undergoing oncology work up. GI will reach out to patient in 2 months to schedule.  On exam today patient has a friend on speaker phone to provide additional history. He has experienced neck swelling on the right side x 1 year ago that resolved spontaneously but reappeared in January or February of this year. Pain with swallowing began approximately three to four weeks ago. Denies any drooling. He initially sought care from his primary care provider, who prescribed a course of antibiotics without improvement. The pain is described as a 'dull ache' that can sometimes be sharp depending on food intake. No fever, weight loss, or changes in appetite have been noted. He denies any other enlarged lymph nodes.  Family history includes his father having  esophageal cancer that metastasized to the spine, his mother having skin and anal cancer, and his sister having thyroid  cancer. Socially, he quit smoking on the day of his heart surgery (03/04/2024) after smoking a pack a day for fifty years. He also quit heavy daily alcohol consumption fifteen years ago after drinking for thirty years. 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 Has worked as an Museum/gallery exhibitions officer in past as well. He is unsure if his PSA has been checked in the past.  MEDICAL HISTORY:  Past Medical History:  Diagnosis Date   Chronic left hip pain    Compression fracture of spine (HCC)    multiple compression Fx with disc bulge   Coronary artery disease    Hypertension    Pneumonia    As a teenager   Stroke [facility]) 2022   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    SURGICAL HISTORY: Past Surgical History:  Procedure Laterality Date   BACK SURGERY     Radiocartography T2-T12   CORONARY ARTERY BYPASS GRAFT N/A 03/04/2024   Procedure: CORONARY ARTERY BYPASS GRAFTING (CABG) TIMES THREE UTILIZING LEFT INTERNAL MAMMARY ARTERY AND ENDOSCOPIC VEIN HARVEST RIGHT GREATER SAPHENOUS VEIN;  Surgeon: Shyrl Linnie KIDD, MD;  Location: [facility] OR;  Service: Open Heart Surgery;  Laterality: N/A;   KNEE ARTHROSCOPY Bilateral    LEFT HEART CATH AND CORONARY ANGIOGRAPHY N/A 11/28/2023   Procedure: LEFT HEART CATH AND CORONARY ANGIOGRAPHY;  Surgeon: Swaziland, Peter M, MD;  Location: [facility] INVASIVE CV  LAB;  Service: Cardiovascular;  Laterality: N/A;   PILONIDAL CYST EXCISION     In patients 20s   TEE WITHOUT CARDIOVERSION N/A 03/04/2024   Procedure: ECHOCARDIOGRAM, TRANSESOPHAGEAL;  Surgeon: Shyrl Linnie KIDD, MD;  Location: [facility] OR;  Service: Open Heart Surgery;  Laterality: N/A;    SOCIAL HISTORY: Social History   Socioeconomic History   Marital status:  Single    Spouse name: Not on file   Number of children: Not on file   Years of education: Not on file   Highest education level: Not on file  Occupational History   Not on file  Tobacco Use   Smoking status: Former    Current packs/day: 0.50    Types: Cigarettes   Smokeless tobacco: Never  Vaping Use   Vaping status: Never Used  Substance and Sexual Activity   Alcohol use: No    Comment: Sober for 20 yeras as of 02/2024   Drug use: Not Currently    Types: Crack cocaine, Marijuana    Comment: Used for 30+ years but quit in 1990s and has not picked up since   Sexual activity: Yes  Other Topics Concern   Not on file  Social History Narrative   Not on file   Social Drivers of Health   Financial Resource Strain: Not on file  Food Insecurity: No Food Insecurity (03/05/2024)   Hunger Vital Sign    Worried About Running Out of Food in the Last Year: Never true    Ran Out of Food in the Last Year: Never true  Transportation Needs: No Transportation Needs (03/05/2024)   PRAPARE - Administrator, Civil Service (Medical): No    Lack of Transportation (Non-Medical): No  Physical Activity: Not on file  Stress: Not on file  Social Connections: Moderately Integrated (03/05/2024)   Social Connection and Isolation Panel    Frequency of Communication with Friends and Family: More than three times a week    Frequency of Social Gatherings with Friends and Family: More than three times a week    Attends Religious Services: More than 4 times per year    Active Member of [employer] or Organizations: Yes    Attends Banker Meetings: 1 to 4 times per year    Marital Status: Divorced  Catering manager Violence: Not At Risk (03/05/2024)   Humiliation, Afraid, Rape, and Kick questionnaire    Fear of Current or Ex-Partner: No    Emotionally Abused: No    Physically Abused: No    Sexually Abused: No    FAMILY HISTORY: Family History  Problem Relation Age of Onset   Cancer  Mother    Hypertension Mother    Heart failure Mother    Cancer Father    Hypertension Father    Heart failure Father    Heart disease Father     ALLERGIES:  has no known allergies.  MEDICATIONS:  Current Outpatient Medications  Medication Sig Dispense Refill   HYDROcodone -acetaminophen  (NORCO/VICODIN) 5-325 MG tablet Take 1 tablet by mouth every 6 (six) hours as needed for moderate pain (pain score 4-6). 56 tablet 0   amiodarone  (PACERONE ) 200 MG tablet Take 1 tablet by mouth daily.     amLODipine  (NORVASC ) 10 MG tablet Take 1 tablet (10 mg total) by mouth daily. 90 tablet 2   apixaban  (ELIQUIS ) 5 MG TABS tablet Take 1 tablet (5 mg total) by mouth 2 (two) times daily. 60 tablet 0   aspirin  EC 81 MG  tablet Take 1 tablet (81 mg total) by mouth daily. Swallow whole. 30 tablet 0   atorvastatin  (LIPITOR ) 80 MG tablet Take 1 tablet (80 mg total) by mouth daily with supper. 90 tablet 3   isosorbide  mononitrate (IMDUR ) 30 MG 24 hr tablet Take 1 tablet (30 mg total) by mouth daily. 90 tablet 1   nitroGLYCERIN  (NITROSTAT ) 0.4 MG SL tablet Place 1 tablet (0.4 mg total) under the tongue every 5 (five) minutes as needed for chest pain. DISSOLVE 1 TABLET UNDER THE TONGUE EVERY 5 MINUTES AS NEEDED FOR CHEST PAIN. DO NOT EXCEED A TOTAL OF 3 DOSES IN 15 MINUTES. 25 tablet 6   pantoprazole  (PROTONIX ) 40 MG tablet Take 1 tablet (40 mg total) by mouth daily. 30 tablet 0   vitamin D3 (CHOLECALCIFEROL ) 25 MCG tablet Take 1 tablet (1,000 Units total) by mouth daily. 30 tablet 0   No current facility-administered medications for this visit.    REVIEW OF SYSTEMS:   All other systems are reviewed and are negative for acute change except as noted in the HPI.  PHYSICAL EXAMINATION: ECOG PERFORMANCE STATUS: 1 - Symptomatic but completely ambulatory  Vitals:   06/19/24 1234  BP: 130/75  Pulse: 76  Resp: 18  Temp: 98.2 F (36.8 C)  SpO2: 98%   Filed Weights   06/19/24 1234  Weight: 234 lb (106.1 kg)     Physical Exam Vitals reviewed.  Constitutional:      Appearance: He is not ill-appearing or toxic-appearing.  HENT:     Head: Normocephalic.     Right Ear: External ear normal.     Left Ear: External ear normal.     Nose: Nose normal.     Mouth/Throat:     Pharynx: Uvula midline. No pharyngeal swelling, oropharyngeal exudate or posterior oropharyngeal erythema.     Comments: No drooling. Handling secretions without difficulty Eyes:     Conjunctiva/sclera: Conjunctivae normal.  Cardiovascular:     Rate and Rhythm: Normal rate and regular rhythm.     Pulses: Normal pulses.     Heart sounds: Normal heart sounds.  Pulmonary:     Effort: Pulmonary effort is normal.     Breath sounds: Normal breath sounds.  Abdominal:     General: There is no distension.  Musculoskeletal:        General: Normal range of motion.     Cervical back: Normal range of motion.  Lymphadenopathy:     Cervical: Cervical adenopathy (right) present.  Skin:    General: Skin is warm and dry.  Neurological:     Mental Status: He is alert and oriented to person, place, and time.  Psychiatric:        Mood and Affect: Mood normal.       LABORATORY DATA:  I have reviewed the data as listed    Latest Ref Rng & Units 06/19/2024    1:53 PM 03/21/2024    5:30 AM 03/18/2024    5:41 AM  CBC  WBC 4.0 - 10.5 K/uL 8.7  11.6  13.2   Hemoglobin 13.0 - 17.0 g/dL 85.6  87.8  88.4   Hematocrit 39.0 - 52.0 % 43.0  36.1  34.3   Platelets 150 - 400 K/uL 265  600  600        Latest Ref Rng & Units 06/19/2024    1:53 PM 06/16/2024   11:00 AM 03/21/2024    5:30 AM  CMP  Glucose 70 - 99 mg/dL 898  106   BUN 8 - 23 mg/dL 19   14   Creatinine 9.38 - 1.24 mg/dL 8.65  8.79  8.61   Sodium 135 - 145 mmol/L 137   135   Potassium 3.5 - 5.1 mmol/L 4.2   4.2   Chloride 98 - 111 mmol/L 102   102   CO2 22 - 32 mmol/L 27   23   Calcium  8.9 - 10.3 mg/dL 89.8   9.2   Total Protein 6.5 - 8.1 g/dL 8.3     Total Bilirubin 0.0 - 1.2  mg/dL 0.9     Alkaline Phos 38 - 126 U/L 61     AST 15 - 41 U/L 20     ALT 0 - 44 U/L 27        RADIOGRAPHIC STUDIES: I have personally reviewed the radiological images as listed and agreed with the findings in the report. CT SOFT TISSUE NECK W CONTRAST Result Date: 06/17/2024 CLINICAL DATA:  Cervical lymphadenopathy. Localized enlarged lymph nodes. Laboratory values checked at location with creatinine level of 1.2. EXAM: CT NECK WITH CONTRAST TECHNIQUE: Multidetector CT imaging of the neck was performed using the standard protocol following the bolus administration of intravenous contrast. RADIATION DOSE REDUCTION: This exam was performed according to the departmental dose-optimization program which includes automated exposure control, adjustment of the mA and/or kV according to patient size and/or use of iterative reconstruction technique. CONTRAST:  80mL OMNIPAQUE  IOHEXOL  300 MG/ML  SOLN COMPARISON:  Ultrasound 06/05/2024 FINDINGS: Pharynx and larynx: 2 cm supraglottic mass to the right of midline consistent with a malignancy. No evidence of regional cartilage invasion. Salivary glands: Parotid and submandibular glands are normal. Thyroid : Normal Lymph nodes: Bulky malignant lymphadenopathy on the right. Adjacent level 2 nodes with indistinct margins. The upper node shows a diameter of 2.2 x 2.8 cm. The lower node measures approximately 28 x 17 mm. Level 3 lymph node similarly with indistinct margins shows diameter of 21 x 19 mm. On the left, lymph nodes are within normal limits in size and density. Vascular: Atherosclerotic disease at the carotid bifurcations without evidence of flow limiting stenosis. Some mass-effect upon the right jugular vein because of the lymphadenopathy but no occlusion or thrombosis. Limited intracranial: Normal Visualized orbits: Normal Mastoids and visualized paranasal sinuses: Clear Skeleton: Ordinary cervical spondylosis. Upper chest: Emphysema.  No focal lesion. Other: None  IMPRESSION: 1. 2 cm supraglottic mass to the right of midline consistent with a malignancy. No evidence of regional cartilage invasion. 2. Bulky malignant lymphadenopathy on the right at level 2 and level 3, with indistinct margins consistent with extra capsular spread. The upper level 2 node shows a diameter of 2.2 x 2.8 cm. The lower level 2 node measures approximately 28 x 17 mm. level 3 lymph node similarly with indistinct margins shows diameter of 21 x 19 mm. 3. Atherosclerotic disease at the carotid bifurcations without evidence of flow limiting stenosis. Some mass-effect upon the right jugular vein because of the lymphadenopathy but no occlusion or thrombosis. 4. Emphysema. Emphysema (ICD10-J43.9). Electronically Signed   By: Oneil Officer M.D.   On: 06/17/2024 13:19   US  SOFT TISSUE HEAD & NECK (NON-THYROID ) Result Date: 06/09/2024 CLINICAL DATA:  Intermittently palpable neck mass EXAM: ULTRASOUND OF HEAD/NECK SOFT TISSUES TECHNIQUE: Ultrasound examination of the head and neck soft tissues was performed in the area of clinical concern. COMPARISON:  None Available. FINDINGS: Focal sonographic evaluation of the region of clinical concern demonstrates an overtly abnormally enlarged lymph node  in the right superior cervical chain. The largest node measures 5.1 x 2.1 x 3.0 cm. A second slightly smaller but still morphologically abnormal appearing lymph node measures 2.5 x 1.4 x 1.6 cm. Lymph nodes demonstrate abnormal thickening and irregularity of the cortex. The fatty hila are diminished. IMPRESSION: The palpable abnormality corresponds with pathologic lymphadenopathy in the right upper cervical nodal station. Differential considerations include metastatic head neck cancer versus lymphoma. Recommend further evaluation with contrast-enhanced CT scan of the neck followed by soft tissue sampling (lesions should be amenable to ultrasound-guided biopsy). These results will be called to the ordering clinician or  representative by the Radiologist Assistant, and communication documented in the PACS or [employer]. Electronically Signed   By: Wilkie Lent M.D.   On: 06/09/2024 10:23    ASSESSMENT & PLAN Dave Williams is a 68 y.o. male presenting to the [facility] for consultation regarding supraglottic mass and cervical lymphadenopathy. We have reviewed etiologies including malignant process or infectious vs inflammatory process. Patient will proceed with laboratory workup today.   #Cervical lymphadenopathy and supraglottic mass - Reviewed CT neck and US  head and neck results with patient. - Labs collected today include CBC, CMP, ESR, CRP, LDH, flow cytometry, Hep B & C serologies, HIV serology. - STAT ENT referral ordered placed for consult/biopsy.  #Age related screenings - Recently established care with GI with plan for colonoscopy in approx 2 months. - Will check PSA today to update prostate cancer screening.  -Patient will RTC when work up is complete.  Patient expressed understanding of the recommended workup and is agreeable to move forward.   All questions were answered. The patient knows to call the clinic with any problems, questions or concerns.  Shared visit with Dr. Federico.  Orders Placed This Encounter  Procedures   CBC with Differential (Cancer Center Only)   CMP (Cancer Center only)   C-reactive protein   Flow Cytometry, Peripheral Blood (Oncology)   Hepatitis B core antibody, total   Hepatitis B surface antibody,qualitative   Hepatitis B surface antigen   Hepatitis C antibody   Lactate dehydrogenase   HIV Antibody (routine testing w rflx)   Sedimentation rate   Prostate-Specific AG, Serum    Standing Status:   Future    Expected Date:   06/19/2024    Expiration Date:   06/19/2025   Ambulatory referral to ENT    Referral Priority:   Urgent    Referral Type:   Consultation    Referral Reason:   Specialty Services Required    Referred to Provider:    Tobie Eldora NOVAK, MD    Requested Specialty:   Otolaryngology    Number of Visits Requested:   1      I have spent a total of 60 minutes minutes of face-to-face and non-face-to-face time, preparing to see the patient, obtaining and/or reviewing separately obtained history, performing a medically appropriate examination, counseling and educating the patient, ordering medications/tests/procedures, referring and communicating with other health care professionals, documenting clinical information in the electronic health record, independently interpreting results and communicating results to the patient, and care coordination.   Dave Demaria Walisiewicz PA-C Department of Hematology/Oncology [facility] at [facility] Phone: [phone]  I have read the above note and personally examined the patient. I agree with the assessment and plan as noted above.  Briefly Mr. Dave Williams is a 68 year old male who presents for evaluation of marked bilateral cervical lymphadenopathy and a 2 cm supraglottic mass.  At this time findings are highly suspicious for a head and neck cancer, though lymphoma is on the differential.  He is not currently having any fevers, chills, sweats and denies any dysphagia.  Overall he feels well and has had no new symptoms other than his lymphadenopathy.  At this time I would recommend referral to ENT for biopsy of the supraglottic mass plus or minus biopsy of one of the large lymph nodes with FNA.  Will reach out to Dr. Tobie to have this scheduled as soon as is feasible.  Mr. Kiser had an opportunity to ask any questions and concerns.  We answered his questions.  Full 10 point ROS is otherwise negative.   Norleen IVAR Kidney, MD Department of Hematology/Oncology [facility] at [facility] Phone: [phone] Pager: [phone] Email: [email or web address]

## 2024-06-19 ENCOUNTER — Encounter (HOSPITAL_COMMUNITY)
Admission: RE | Admit: 2024-06-19 | Discharge: 2024-06-19 | Disposition: A | Discharge status: Home / Self Care | Source: Ambulatory Visit | Attending: Cardiovascular Disease

## 2024-06-19 ENCOUNTER — Encounter: Payer: Self-pay | Admitting: Medical Oncology

## 2024-06-19 ENCOUNTER — Inpatient Hospital Stay: Attending: Physician Assistant | Admitting: Physician Assistant

## 2024-06-19 ENCOUNTER — Inpatient Hospital Stay

## 2024-06-19 VITALS — BP 130/75 | HR 76 | Temp 98.2°F | Resp 18 | Ht 73.0 in | Wt 234.0 lb

## 2024-06-19 DIAGNOSIS — Z7901 Long term (current) use of anticoagulants: Secondary | ICD-10-CM | POA: Insufficient documentation

## 2024-06-19 DIAGNOSIS — Z951 Presence of aortocoronary bypass graft: Secondary | ICD-10-CM | POA: Insufficient documentation

## 2024-06-19 DIAGNOSIS — J387 Other diseases of larynx: Secondary | ICD-10-CM | POA: Insufficient documentation

## 2024-06-19 DIAGNOSIS — Z87891 Personal history of nicotine dependence: Secondary | ICD-10-CM | POA: Insufficient documentation

## 2024-06-19 DIAGNOSIS — I10 Essential (primary) hypertension: Secondary | ICD-10-CM | POA: Insufficient documentation

## 2024-06-19 DIAGNOSIS — I480 Paroxysmal atrial fibrillation: Secondary | ICD-10-CM | POA: Insufficient documentation

## 2024-06-19 DIAGNOSIS — R590 Localized enlarged lymph nodes: Secondary | ICD-10-CM | POA: Diagnosis not present

## 2024-06-19 DIAGNOSIS — Z0000 Encounter for general adult medical examination without abnormal findings: Secondary | ICD-10-CM

## 2024-06-19 DIAGNOSIS — Z8673 Personal history of transient ischemic attack (TIA), and cerebral infarction without residual deficits: Secondary | ICD-10-CM | POA: Insufficient documentation

## 2024-06-19 LAB — CMP (CANCER CENTER ONLY)
ALT: 27 U/L (ref 0–44)
AST: 20 U/L (ref 15–41)
Albumin: 4.7 g/dL (ref 3.5–5.0)
Alkaline Phosphatase: 61 U/L (ref 38–126)
Anion gap: 8 (ref 5–15)
BUN: 19 mg/dL (ref 8–23)
CO2: 27 mmol/L (ref 22–32)
Calcium: 10.1 mg/dL (ref 8.9–10.3)
Chloride: 102 mmol/L (ref 98–111)
Creatinine: 1.34 mg/dL — ABNORMAL HIGH (ref 0.61–1.24)
GFR, Estimated: 58 mL/min — ABNORMAL LOW (ref >60)
Glucose, Bld: 101 mg/dL — ABNORMAL HIGH (ref 70–99)
Potassium: 4.2 mmol/L (ref 3.5–5.1)
Sodium: 137 mmol/L (ref 135–145)
Total Bilirubin: 0.9 mg/dL (ref 0.0–1.2)
Total Protein: 8.3 g/dL — ABNORMAL HIGH (ref 6.5–8.1)

## 2024-06-19 LAB — CBC WITH DIFFERENTIAL (CANCER CENTER ONLY)
Abs Immature Granulocytes: 0.05 K/uL (ref 0.00–0.07)
Basophils Absolute: 0.1 K/uL (ref 0.0–0.1)
Basophils Relative: 1 %
Eosinophils Absolute: 0.2 K/uL (ref 0.0–0.5)
Eosinophils Relative: 2 %
HCT: 43 % (ref 39.0–52.0)
Hemoglobin: 14.3 g/dL (ref 13.0–17.0)
Immature Granulocytes: 1 %
Lymphocytes Relative: 30 %
Lymphs Abs: 2.6 K/uL (ref 0.7–4.0)
MCH: 28.7 pg (ref 26.0–34.0)
MCHC: 33.3 g/dL (ref 30.0–36.0)
MCV: 86.3 fL (ref 80.0–100.0)
Monocytes Absolute: 0.7 K/uL (ref 0.1–1.0)
Monocytes Relative: 8 %
Neutro Abs: 5 K/uL (ref 1.7–7.7)
Neutrophils Relative %: 58 %
Platelet Count: 265 K/uL (ref 150–400)
RBC: 4.98 MIL/uL (ref 4.22–5.81)
RDW: 15.3 % (ref 11.5–15.5)
WBC Count: 8.7 K/uL (ref 4.0–10.5)
nRBC: 0 % (ref 0.0–0.2)

## 2024-06-19 LAB — HEPATITIS B SURFACE ANTIBODY,QUALITATIVE: Hep B S Ab: NONREACTIVE

## 2024-06-19 LAB — SEDIMENTATION RATE: Sed Rate: 14 mm/h (ref 0–16)

## 2024-06-19 LAB — LACTATE DEHYDROGENASE: LDH: 149 U/L (ref 98–192)

## 2024-06-19 LAB — HIV ANTIBODY (ROUTINE TESTING W REFLEX): HIV Screen 4th Generation wRfx: NONREACTIVE

## 2024-06-19 LAB — C-REACTIVE PROTEIN: CRP: 0.5 mg/dL (ref <1.0)

## 2024-06-19 LAB — HEPATITIS C ANTIBODY: HCV Ab: NONREACTIVE

## 2024-06-19 LAB — HEPATITIS B SURFACE ANTIGEN: Hepatitis B Surface Ag: NONREACTIVE

## 2024-06-19 NOTE — Patient Instructions (Signed)
 Diagnostic Clinic Office Visit Discharge Information and Instructions  Thank you for choosing [facility] for your healthcare needs.  Below is a summary of today's discussion, along with our contact information and an outline of what to expect next.  Reason for Visit:  enlarged lymph nodes  Proposed Diagnostic Care Plan: Labs collected today Urgent referral sent to ENT. The office will contact you to schedule an appointment. If you do not hear from ENT office by Monday please call Colene and let her know.     What to Expect: - Generally, when lab tests are ordered the results can take up to 1 week for results to be available.  At that point, we will contact you to discuss your results with you.  Unless there is a critical result, we will typically wait for all of your lab results to be available before contacting you. - If a biopsy is part of your Care Plan, those results can take on average 7-10 days to result.  Once results are available, we will contact you to discuss your pathology results and any next steps. - If you have additional imaging ordered, such as a CT Scan, MRI, Ultrasound, Bone Scan, or PET scan, your imaging will need to be authorized then scheduled with the earliest available appointment.  You may be asked to travel to another hospital within [facility] who has a sooner availability, please consider doing so if asked. - If you use MyChart, your results will be available to you in the MyChart portal.  Your provider will be in touch with you as soon as all of your results are available to be discussed.  Your Diagnostic Clinic Provider:  Baldemar Dady Walisiewicz PA-C and Dr. Federico Your Diagnostic Navigator:  Colene Raider RN, office number [phone]  If you or your caregiver have number blocking on your cell phones, please ensure the cancer center's numbers are not blocked.  If you are not a registered MyChart user, please consider enrolling in MyChart to receive your test  results and visit notes.  You can also access your discharge instructions electronically.  MyChart also gives you an electronic means to communicate with your Care Team instead of needing to call in to the cancer center.  We appreciate you trusting us  with your healthcare and look forward to partnering with you as we work to uncover what your potential diagnosis may be.  Please do not hesitate to reach out at any point with questions or concerns.

## 2024-06-19 NOTE — Progress Notes (Signed)
 Daily Session Note  Patient Details  Name: Val Schiavo MRN: [id number] Date of Birth: 1956-01-13 Referring Provider:   Flowsheet Row CARDIAC REHAB PHASE II ORIENTATION from 05/20/2024 in [facility] CARDIAC REHABILITATION  Referring Provider Court Carrier MD    Encounter Date: 06/19/2024  Check In:  Session Check In - 06/19/24 0954       Check-In   Supervising physician immediately available to respond to emergencies See telemetry face sheet for immediately available MD    Location [facility]    Staff Present Laymon Rattler, BSN, RN, Rosalba Gelineau, MA, RCEP, CCRP, CCET    Virtual Visit No    Medication changes reported     No    Fall or balance concerns reported    No    Tobacco Cessation No Change    Warm-up and Cool-down Performed on first and last piece of equipment    Resistance Training Performed Yes    VAD Patient? No    PAD/SET Patient? No      Pain Assessment   Currently in Pain? No/denies          Capillary Blood Glucose: No results found for this or any previous visit (from the past 24 hours).    Social History   Tobacco Use  Smoking Status Former   Current packs/day: 0.50   Types: Cigarettes  Smokeless Tobacco Never    Goals Met:  Independence with exercise equipment Exercise tolerated well No report of concerns or symptoms today Strength training completed today  Goals Unmet:  Not Applicable  Comments: Pt able to follow exercise prescription today without complaint.  Will continue to monitor for progression.

## 2024-06-19 NOTE — Progress Notes (Signed)
 [facility]  Patient presented to clinic, alone, for his scheduled appointment with Mallie Combes, PA-C. I introduced myself and provided him with my direct contact information. Patient was encouraged to call me with any questions/concerns he may have.   Colene KYM Raider, RN, BSN, [facility] Oncology Nurse Navigator, [facility] 06/19/2024 3:22 PM

## 2024-06-20 ENCOUNTER — Telehealth: Payer: Self-pay | Admitting: *Deleted

## 2024-06-20 LAB — HEPATITIS B CORE ANTIBODY, TOTAL: HEP B CORE AB: NEGATIVE

## 2024-06-20 NOTE — Telephone Encounter (Signed)
 Patient needs TCS with Dr. Castaneda (asa 3) in August. He is on Eliquis . Does he need clearance to hold this or can he just hold it?

## 2024-06-20 NOTE — Telephone Encounter (Signed)
 He needs clearance for this. Also, he will need a U tox part of his preop labs Thanks

## 2024-06-21 ENCOUNTER — Encounter (HOSPITAL_COMMUNITY)
Admission: RE | Admit: 2024-06-21 | Discharge: 2024-06-21 | Disposition: A | Discharge status: Home / Self Care | Source: Ambulatory Visit | Attending: Cardiovascular Disease

## 2024-06-21 ENCOUNTER — Telehealth: Payer: Self-pay | Admitting: *Deleted

## 2024-06-21 DIAGNOSIS — Z951 Presence of aortocoronary bypass graft: Secondary | ICD-10-CM

## 2024-06-21 DIAGNOSIS — R590 Localized enlarged lymph nodes: Secondary | ICD-10-CM | POA: Diagnosis not present

## 2024-06-21 LAB — SURGICAL PATHOLOGY

## 2024-06-21 NOTE — Telephone Encounter (Signed)
 Clinical pharmacist to review Eliquis .  Patient had postoperative atrial fibrillation after CABG.  Based on Dr. Sherry last note PAF (paroxysmal atrial fibrillation) (HCC) History of PAF maintaining sinus rhythm on Eliquis  and amiodarone .  It is now 2 months postop.  I am going to decrease the amiodarone  from 200 to 100 mg a day.  At his next office visit if he still in sinus rhythm we will discontinue both of these drugs.  Next follow-up is with Lum Louis NP near the end of August.  However suspected at the time of colonoscopy, patient may still be on Eliquis , therefore we will have our clinical pharmacist to comment on how long to hold Eliquis  in case he is still on it.

## 2024-06-21 NOTE — Progress Notes (Signed)
 Daily Session Note  Patient Details  Name: Dave Williams MRN: [id number] Date of Birth: 1956-11-18 Referring Provider:   Flowsheet Row CARDIAC REHAB PHASE II ORIENTATION from 05/20/2024 in [facility] CARDIAC REHABILITATION  Referring Provider Court Carrier MD    Encounter Date: 06/21/2024  Check In:  Session Check In - 06/21/24 0915       Check-In   Supervising physician immediately available to respond to emergencies See telemetry face sheet for immediately available MD    Location [facility]    Staff Present Powell Benders, BS, Exercise Physiologist;Jessica Vonzell, MA, RCEP, CCRP, CCET;Laureen Brown, BS, RRT, CPFT    Virtual Visit No    Medication changes reported     No    Fall or balance concerns reported    No    Tobacco Cessation No Change    Warm-up and Cool-down Performed on first and last piece of equipment    Resistance Training Performed Yes    VAD Patient? No    PAD/SET Patient? No      Pain Assessment   Currently in Pain? No/denies    Multiple Pain Sites No          Capillary Blood Glucose: No results found for this or any previous visit (from the past 24 hours).    Social History   Tobacco Use  Smoking Status Former   Current packs/day: 0.50   Types: Cigarettes  Smokeless Tobacco Never    Goals Met:  Independence with exercise equipment Exercise tolerated well No report of concerns or symptoms today Strength training completed today  Goals Unmet:  Not Applicable  Comments: Pt able to follow exercise prescription today without complaint.  Will continue to monitor for progression.

## 2024-06-21 NOTE — Telephone Encounter (Signed)
  Request for patient to stop medication prior to procedure or is needing cleareance  06/21/24  Dave Williams Jan 04, 1956  What type of surgery is being performed? COLONOSCOPY  When is surgery scheduled? TBD  What type of clearance is required (medical or pharmacy to hold medication or both? MEDICATION  Are there any medications that need to be held prior to surgery and how long? ELIQUIS  X 2 DAYS  Name of physician performing surgery?  Dr. Eartha Rouse [facility] at [facility] Phone: [phone] Fax: [phone]  Anethesia type (none, local, MAC, general)? MAC

## 2024-06-21 NOTE — Telephone Encounter (Signed)
 Clearance faxed

## 2024-06-24 ENCOUNTER — Encounter (HOSPITAL_COMMUNITY)
Admission: RE | Admit: 2024-06-24 | Discharge: 2024-06-24 | Disposition: A | Discharge status: Home / Self Care | Source: Ambulatory Visit | Attending: Cardiovascular Disease

## 2024-06-24 DIAGNOSIS — R590 Localized enlarged lymph nodes: Secondary | ICD-10-CM | POA: Diagnosis not present

## 2024-06-24 DIAGNOSIS — Z951 Presence of aortocoronary bypass graft: Secondary | ICD-10-CM

## 2024-06-24 LAB — FLOW CYTOMETRY

## 2024-06-24 NOTE — Progress Notes (Signed)
 Daily Session Note  Patient Details  Name: Dave Williams MRN: [id number] Date of Birth: January 20, 1956 Referring Provider:   Flowsheet Row CARDIAC REHAB PHASE II ORIENTATION from 05/20/2024 in [facility] CARDIAC REHABILITATION  Referring Provider Court Carrier MD    Encounter Date: 06/24/2024  Check In:  Session Check In - 06/24/24 0933       Check-In   Supervising physician immediately available to respond to emergencies See telemetry face sheet for immediately available MD    Location [facility]    Staff Present Powell Benders, BS, Exercise Physiologist;Georgie Haque Vonzell, MA, RCEP, CCRP, CCET;Brittany Jackquline, BSN, RN, WTA-C    Virtual Visit No    Medication changes reported     No    Fall or balance concerns reported    No    Warm-up and Cool-down Performed on first and last piece of equipment    Resistance Training Performed Yes    VAD Patient? No    PAD/SET Patient? No      Pain Assessment   Currently in Pain? No/denies          Capillary Blood Glucose: No results found for this or any previous visit (from the past 24 hours).    Social History   Tobacco Use  Smoking Status Former   Current packs/day: 0.50   Types: Cigarettes  Smokeless Tobacco Never    Goals Met:  Independence with exercise equipment Exercise tolerated well No report of concerns or symptoms today Strength training completed today  Goals Unmet:  Not Applicable  Comments: Pt able to follow exercise prescription today without complaint.  Will continue to monitor for progression.

## 2024-06-25 ENCOUNTER — Encounter: Payer: Self-pay | Admitting: Medical Oncology

## 2024-06-25 NOTE — Progress Notes (Signed)
 [facility]  Call to patient, per Mallie Combes PA-C, informed patient that his resent labs are normal. Patient also informed that his Hep B resulted the he does not have immunity, and will need to get the vaccine series repeated with his PCP after [facility] workup is completed. Patient was made aware that that there is no active Hep B infection. Patient gave his verbal understanding and denied questions at this time. Patient confirms that he has appointment tomorrow with Dr. Tobie. Patient thanked and encouraged to call with any questions or concerns.   Colene KYM Raider, RN, BSN, [facility] Oncology Nurse Navigator, [facility] 06/25/2024 10:12 AM

## 2024-06-26 ENCOUNTER — Encounter (HOSPITAL_COMMUNITY)

## 2024-06-26 ENCOUNTER — Encounter (HOSPITAL_COMMUNITY): Payer: Self-pay | Admitting: *Deleted

## 2024-06-26 ENCOUNTER — Telehealth (INDEPENDENT_AMBULATORY_CARE_PROVIDER_SITE_OTHER): Payer: Self-pay | Admitting: Otolaryngology

## 2024-06-26 ENCOUNTER — Encounter: Payer: Self-pay | Admitting: Oncology

## 2024-06-26 ENCOUNTER — Encounter (INDEPENDENT_AMBULATORY_CARE_PROVIDER_SITE_OTHER): Payer: Self-pay | Admitting: Otolaryngology

## 2024-06-26 ENCOUNTER — Ambulatory Visit (INDEPENDENT_AMBULATORY_CARE_PROVIDER_SITE_OTHER): Admitting: Otolaryngology

## 2024-06-26 VITALS — BP 129/77 | HR 79 | Ht 73.0 in | Wt 230.0 lb

## 2024-06-26 DIAGNOSIS — J392 Other diseases of pharynx: Secondary | ICD-10-CM

## 2024-06-26 DIAGNOSIS — Z951 Presence of aortocoronary bypass graft: Secondary | ICD-10-CM

## 2024-06-26 DIAGNOSIS — R221 Localized swelling, mass and lump, neck: Secondary | ICD-10-CM | POA: Diagnosis not present

## 2024-06-26 NOTE — Telephone Encounter (Signed)
 Discussed with patient's cardiologist -- ok to hold eliquis  2 days pre-op, plan to resume post op day 1. Dave Williams B Dave Williams

## 2024-06-26 NOTE — Telephone Encounter (Signed)
-----   Message from Dorn Lesches sent at 06/26/2024 11:58 AM EDT ----- Regarding: RE: semi urgent clearance for patient Okay to interrupt Eliquis . ----- Message ----- From: Tobie Eldora NOVAK, MD Sent: 06/26/2024   9:28 AM EDT To: Dorn JINNY Lesches, MD Subject: semi urgent clearance for patient              Hi Dr. Lesches, My name is Eldora Tobie and I'm one of the ENTs at [facility]. He's got throat cancer, and needs a semi urgent biopsy next week. Would you be ok with holding his eliquis  2 days before surgery and resume it post op day 1? Thanks Feel free to text me if that is preferable: [phone] Eldora Tobie

## 2024-06-26 NOTE — Progress Notes (Signed)
 Cardiac Individual Treatment Plan  Patient Details  Name: Dave Williams MRN: [id number] Date of Birth: 1956-11-28 Referring Provider:   Flowsheet Row CARDIAC REHAB PHASE II ORIENTATION from 05/20/2024 in [facility] CARDIAC REHABILITATION  Referring Provider Court Carrier MD    Initial Encounter Date:  Flowsheet Row CARDIAC REHAB PHASE II ORIENTATION from 05/20/2024 in [place] [facility] [facility]  Date 05/20/24    Visit Diagnosis: S/P CABG x 3  Patient's Home Medications on Admission:  Current Outpatient Medications:    HYDROcodone -acetaminophen  (NORCO/VICODIN) 5-325 MG tablet, Take 1 tablet by mouth every 6 (six) hours as needed for moderate pain (pain score 4-6)., Disp: 56 tablet, Rfl: 0   amiodarone  (PACERONE ) 200 MG tablet, Take 1 tablet by mouth daily., Disp: , Rfl:    amLODipine  (NORVASC ) 10 MG tablet, Take 1 tablet (10 mg total) by mouth daily., Disp: 90 tablet, Rfl: 2   apixaban  (ELIQUIS ) 5 MG TABS tablet, Take 1 tablet (5 mg total) by mouth 2 (two) times daily., Disp: 60 tablet, Rfl: 0   aspirin  EC 81 MG tablet, Take 1 tablet (81 mg total) by mouth daily. Swallow whole., Disp: 30 tablet, Rfl: 0   atorvastatin  (LIPITOR ) 80 MG tablet, Take 1 tablet (80 mg total) by mouth daily with supper., Disp: 90 tablet, Rfl: 3   isosorbide  mononitrate (IMDUR ) 30 MG 24 hr tablet, Take 1 tablet (30 mg total) by mouth daily., Disp: 90 tablet, Rfl: 1   nitroGLYCERIN  (NITROSTAT ) 0.4 MG SL tablet, Place 1 tablet (0.4 mg total) under the tongue every 5 (five) minutes as needed for chest pain. DISSOLVE 1 TABLET UNDER THE TONGUE EVERY 5 MINUTES AS NEEDED FOR CHEST PAIN. DO NOT EXCEED A TOTAL OF 3 DOSES IN 15 MINUTES., Disp: 25 tablet, Rfl: 6   pantoprazole  (PROTONIX ) 40 MG tablet, Take 1 tablet (40 mg total) by mouth daily., Disp: 30 tablet, Rfl: 0   vitamin D3 (CHOLECALCIFEROL ) 25 MCG tablet, Take 1 tablet (1,000 Units total) by mouth daily., Disp: 30 tablet, Rfl: 0  Past Medical History: Past  Medical History:  Diagnosis Date   Chronic left hip pain    Compression fracture of spine (HCC)    multiple compression Fx with disc bulge   Coronary artery disease    Hypertension    Pneumonia    As a teenager   Stroke [facility]) 2022   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    Tobacco Use: Social History   Tobacco Use  Smoking Status Former   Current packs/day: 0.50   Types: Cigarettes  Smokeless Tobacco Never    Labs: Review Flowsheet       Latest Ref Rng & Units 02/29/2024 03/04/2024 03/06/2024  Labs for ITP Cardiac and Pulmonary Rehab  Cholestrol 0 - 200 mg/dL - - 94   LDL (calc) 0 - 99 mg/dL - - 50   HDL-C >59 mg/dL - - 22   Trlycerides <849 mg/dL - - 887   Hemoglobin J8r 4.8 - 5.6 % 5.8  - -  PH, Arterial 7.35 - 7.45 - 7.324  7.306  7.340  7.280  7.271  7.290  7.314  -  PCO2 arterial 32 - 48 mmHg - 41.3  40.1  43.4  32.8  49.2  48.8  51.8  -  Bicarbonate 20.0 - 28.0 mmol/L - 21.1  19.7  23.1  15.4  22.8  23.5  25.5  26.4  -  TCO2 22 - 32 mmol/L - 22  21  24  16  24  23  25  26  27  28  25  25   -  Acid-base deficit 0.0 - 2.0 mmol/L - 4.0  6.0  2.0  10.0  4.0  3.0  1.0  -  O2 Saturation % - 95  97  99  98  98  96  84  100  -    Details       Multiple values from one day are sorted in reverse-chronological order         Capillary Blood Glucose: Lab Results  Component Value Date   GLUCAP 113 (H) 03/08/2024   GLUCAP 121 (H) 03/07/2024   GLUCAP 112 (H) 03/07/2024   GLUCAP 111 (H) 03/07/2024   GLUCAP 136 (H) 03/06/2024     Exercise Target Goals: Exercise Program Goal: Individual exercise prescription set using results from initial 6 min walk test and THRR while considering  patient's activity barriers and safety.   Exercise Prescription Goal: Starting with aerobic activity 30 plus minutes a day, 3 days per week for initial exercise prescription. Provide home exercise prescription and guidelines that  participant acknowledges understanding prior to discharge.  Activity Barriers & Risk Stratification:  Activity Barriers & Cardiac Risk Stratification - 05/16/24 0921       Activity Barriers & Cardiac Risk Stratification   Activity Barriers Back Problems;Chest Pain/Angina;Shortness of Breath   Multiple back sugeries for back.   Cardiac Risk Stratification High          6 Minute Walk:  6 Minute Walk     Row Name 05/20/24 1119         6 Minute Walk   Phase Initial     Distance 1432 feet     Walk Time 6 minutes     # of Rest Breaks 0     MPH 2.71     METS 3.31     RPE 8     VO2 Peak 11.57     Symptoms No     Resting HR 69 bpm     Resting BP 110/70     Resting Oxygen Saturation  95 %     Exercise Oxygen Saturation  during 6 min walk 97 %     Max Ex. HR 99 bpm     Max Ex. BP 136/62     2 Minute Post BP 126/62        Oxygen Initial Assessment:   Oxygen Re-Evaluation:   Oxygen Discharge (Final Oxygen Re-Evaluation):   Initial Exercise Prescription:  Initial Exercise Prescription - 05/20/24 1100       Date of Initial Exercise RX and Referring Provider   Date 05/20/24    Referring Provider Court Carrier MD      Oxygen   Maintain Oxygen Saturation 88% or higher      Treadmill   MPH 2.7    Grade 1    Minutes 15    METs 3.44      REL-XR   Level 13    Speed 50    Minutes 15    METs 3.4      Prescription Details   Frequency (times per week) 1   starting with one to meet OOP   Duration Progress to 30 minutes of continuous aerobic without signs/symptoms of physical distress      Intensity   THRR 40-80% of Max Heartrate 102-137    Ratings of Perceived  Exertion 11-13    Perceived Dyspnea 0-4      Progression   Progression Continue to progress workloads to maintain intensity without signs/symptoms of physical distress.      Resistance Training   Training Prescription Yes    Weight 5 lb    Reps 10-15          Perform Capillary Blood Glucose  checks as needed.  Exercise Prescription Changes:   Exercise Prescription Changes     Row Name 05/20/24 1100 06/12/24 1300           Response to Exercise   Blood Pressure (Admit) 110/70 104/62      Blood Pressure (Exercise) 136/62 144/72      Blood Pressure (Exit) 126/62 120/64      Heart Rate (Admit) 69 bpm 83 bpm      Heart Rate (Exercise) 99 bpm 110 bpm      Heart Rate (Exit) 76 bpm 90 bpm      Oxygen Saturation (Admit) 95 % --      Oxygen Saturation (Exercise) 97 % --      Rating of Perceived Exertion (Exercise) 8 13      Symptoms none --      Comments walk test results --      Duration -- Continue with 30 min of aerobic exercise without signs/symptoms of physical distress.      Intensity -- THRR unchanged        Progression   Progression -- Continue to progress workloads to maintain intensity without signs/symptoms of physical distress.        Resistance Training   Training Prescription -- Yes      Weight -- 5      Reps -- 10-15        Treadmill   MPH -- 2.5      Grade -- 2.5      Minutes -- 15      METs -- 3.78        REL-XR   Level -- 5      Speed -- 56      Minutes -- 15      METs -- 2.7         Exercise Comments:   Exercise Comments     Row Name 05/22/24 9076           Exercise Comments First full day of exercise!  Patient was oriented to gym and equipment including functions, settings, policies, and procedures.  Patient's individual exercise prescription and treatment plan were reviewed.  All starting workloads were established based on the results of the 6 minute walk test done at initial orientation visit.  The plan for exercise progression was also introduced and progression will be customized based on patient's performance and goals.          Exercise Goals and Review:   Exercise Goals     Row Name 05/20/24 1122             Exercise Goals   Increase Physical Activity Yes       Intervention Provide advice, education, support and  counseling about physical activity/exercise needs.;Develop an individualized exercise prescription for aerobic and resistive training based on initial evaluation findings, risk stratification, comorbidities and participant's personal goals.       Expected Outcomes Short Term: Attend rehab on a regular basis to increase amount of physical activity.;Long Term: Add in home exercise to make exercise part of routine and to increase amount of physical  activity.;Long Term: Exercising regularly at least 3-5 days a week.       Increase Strength and Stamina Yes       Intervention Provide advice, education, support and counseling about physical activity/exercise needs.;Develop an individualized exercise prescription for aerobic and resistive training based on initial evaluation findings, risk stratification, comorbidities and participant's personal goals.       Expected Outcomes Short Term: Increase workloads from initial exercise prescription for resistance, speed, and METs.;Long Term: Improve cardiorespiratory fitness, muscular endurance and strength as measured by increased METs and functional capacity ( );Short Term: Perform resistance training exercises routinely during rehab and add in resistance training at home       Able to understand and use rate of perceived exertion (RPE) scale Yes       Intervention Provide education and explanation on how to use RPE scale       Expected Outcomes Short Term: Able to use RPE daily in rehab to express subjective intensity level;Long Term:  Able to use RPE to guide intensity level when exercising independently       Able to understand and use Dyspnea scale Yes       Intervention Provide education and explanation on how to use Dyspnea scale       Expected Outcomes Short Term: Able to use Dyspnea scale daily in rehab to express subjective sense of shortness of breath during exertion;Long Term: Able to use Dyspnea scale to guide intensity level when exercising independently        Knowledge and understanding of Target Heart Rate Range (THRR) Yes       Intervention Provide education and explanation of THRR including how the numbers were predicted and where they are located for reference       Expected Outcomes Short Term: Able to state/look up THRR;Short Term: Able to use daily as guideline for intensity in rehab;Long Term: Able to use THRR to govern intensity when exercising independently       Able to check pulse independently Yes       Intervention Provide education and demonstration on how to check pulse in carotid and radial arteries.;Review the importance of being able to check your own pulse for safety during independent exercise       Expected Outcomes Short Term: Able to explain why pulse checking is important during independent exercise;Long Term: Able to check pulse independently and accurately       Understanding of Exercise Prescription Yes       Intervention Provide education, explanation, and written materials on patient's individual exercise prescription       Expected Outcomes Short Term: Able to explain program exercise prescription;Long Term: Able to explain home exercise prescription to exercise independently          Exercise Goals Re-Evaluation :  Exercise Goals Re-Evaluation     Row Name 06/17/24 0951             Exercise Goal Re-Evaluation   Exercise Goals Review Increase Physical Activity;Increase Strength and Stamina;Understanding of Exercise Prescription       Comments [name] states that he just joined the [facility] here in [place] and on the days he is not here, he goes there and swims in the pool.       Expected Outcomes Short: Continue to attend rehab. Long:Incorporate more exercise at home.           Discharge Exercise Prescription (Final Exercise Prescription Changes):  Exercise Prescription Changes - 06/12/24 1300  Response to Exercise   Blood Pressure (Admit) 104/62    Blood Pressure (Exercise) 144/72    Blood  Pressure (Exit) 120/64    Heart Rate (Admit) 83 bpm    Heart Rate (Exercise) 110 bpm    Heart Rate (Exit) 90 bpm    Rating of Perceived Exertion (Exercise) 13    Duration Continue with 30 min of aerobic exercise without signs/symptoms of physical distress.    Intensity THRR unchanged      Progression   Progression Continue to progress workloads to maintain intensity without signs/symptoms of physical distress.      Resistance Training   Training Prescription Yes    Weight 5    Reps 10-15      Treadmill   MPH 2.5    Grade 2.5    Minutes 15    METs 3.78      REL-XR   Level 5    Speed 56    Minutes 15    METs 2.7          Nutrition:  Target Goals: Understanding of nutrition guidelines, daily intake of sodium 1500mg , cholesterol 200mg , calories 30% from fat and 7% or less from saturated fats, daily to have 5 or more servings of fruits and vegetables.  Biometrics:  Pre Biometrics - 05/20/24 1122       Pre Biometrics   Height 6' 1 (1.854 m)    Weight 226 lb 8 oz (102.7 kg)    Waist Circumference 31 inches    Hip Circumference 44 inches    Waist to Hip Ratio 0.7 %    BMI (Calculated) 29.89    Grip Strength 32.7 kg    Single Leg Stand 28.2 seconds           Nutrition Therapy Plan and Nutrition Goals:   Nutrition Assessments:  MEDIFICTS Score Key: >=70 Need to make dietary changes  40-70 Heart Healthy Diet <= 40 Therapeutic Level Cholesterol Diet  Flowsheet Row CARDIAC REHAB PHASE II ORIENTATION from 05/20/2024 in [facility] CARDIAC REHABILITATION  Picture Your Plate Total Score on Admission 30   Picture Your Plate Scores: <59 Unhealthy dietary pattern with much room for improvement. 41-50 Dietary pattern unlikely to meet recommendations for good health and room for improvement. 51-60 More healthful dietary pattern, with some room for improvement.  >60 Healthy dietary pattern, although there may be some specific behaviors that could be improved.     Nutrition Goals Re-Evaluation:  Nutrition Goals Re-Evaluation     Row Name 06/17/24 0947             Goals   Nutrition Goal Loose weight.       Comment [name] states he wants his goal weight to be 210-215 lbs. He is currently at 230 lbs. He states he eats lots of meat, tries to incorporate veggies and fruits. His water intake is adequate at 4-5 bottles per day, and he is an all day coffee drinker. Will drink 3 cups of caffeinated coffee and the rest of the day he will do de-caf.       Expected Outcome Short: Incorporate a more colorful diet, (fruits and veggies). Long: Cut down on the caffeine.          Nutrition Goals Discharge (Final Nutrition Goals Re-Evaluation):  Nutrition Goals Re-Evaluation - 06/17/24 0947       Goals   Nutrition Goal Loose weight.    Comment [name] states he wants his goal weight to be 210-215 lbs. He  is currently at 230 lbs. He states he eats lots of meat, tries to incorporate veggies and fruits. His water intake is adequate at 4-5 bottles per day, and he is an all day coffee drinker. Will drink 3 cups of caffeinated coffee and the rest of the day he will do de-caf.    Expected Outcome Short: Incorporate a more colorful diet, (fruits and veggies). Long: Cut down on the caffeine.          Psychosocial: Target Goals: Acknowledge presence or absence of significant depression and/or stress, maximize coping skills, provide positive support system. Participant is able to verbalize types and ability to use techniques and skills needed for reducing stress and depression.  Initial Review & Psychosocial Screening:  Initial Psych Review & Screening - 05/16/24 0944       Initial Review   Current issues with Current Sleep Concerns      Family Dynamics   Good Support System? Yes      Barriers   Psychosocial barriers to participate in program The patient should benefit from training in stress management and relaxation.;There are no identifiable barriers or  psychosocial needs.      Screening Interventions   Interventions To provide support and resources with identified psychosocial needs;Encouraged to exercise;Provide feedback about the scores to participant    Expected Outcomes Short Term goal: Utilizing psychosocial counselor, staff and physician to assist with identification of specific Stressors or current issues interfering with healing process. Setting desired goal for each stressor or current issue identified.;Long Term Goal: Stressors or current issues are controlled or eliminated.;Short Term goal: Identification and review with participant of any Quality of Life or Depression concerns found by scoring the questionnaire.;Long Term goal: The participant improves quality of Life and PHQ9 Scores as seen by post scores and/or verbalization of changes          Quality of Life Scores:  Quality of Life - 05/20/24 1123       Quality of Life   Select Quality of Life      Quality of Life Scores   Health/Function Pre 19.2 %    Socioeconomic Pre 26.57 %    Psych/Spiritual Pre 12.93 %    Family Pre 27.5 %    GLOBAL Pre 20.03 %         Scores of 19 and below usually indicate a poorer quality of life in these areas.  A difference of  2-3 points is a clinically meaningful difference.  A difference of 2-3 points in the total score of the Quality of Life Index has been associated with significant improvement in overall quality of life, self-image, physical symptoms, and general health in studies assessing change in quality of life.  PHQ-9: Review Flowsheet       05/20/2024  Depression screen PHQ 2/9  Decreased Interest 1  Down, Depressed, Hopeless 1  PHQ - 2 Score 2  Altered sleeping 2  Tired, decreased energy 3  Change in appetite 0  Feeling bad or failure about yourself  1  Trouble concentrating 0  Moving slowly or fidgety/restless 1  Suicidal thoughts 0  PHQ-9 Score 9  Difficult doing work/chores Not difficult at all    Interpretation of Total Score  Total Score Depression Severity:  1-4 = Minimal depression, 5-9 = Mild depression, 10-14 = Moderate depression, 15-19 = Moderately severe depression, 20-27 = Severe depression   Psychosocial Evaluation and Intervention:  Psychosocial Evaluation - 05/16/24 0945       Psychosocial Evaluation &  Interventions   Interventions Stress management education;Relaxation education;Encouraged to exercise with the program and follow exercise prescription    Comments 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 He denies any depression, anxiety or stressors. He does report some difficulty staying asleep and has been taking Advil PM long term for sleep. He lives alone. He says he has friends and neigbors that would help him out if needed. 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 He has a $25 copayment and is about to meet his out of pocket maximun which will end the copayment. He is only going to do 1 day/week until he meets the OOP and then go to 3 days/week. His goals for the program are to get his stamina back; improve his muscle tone and get back in shape overall. He has no barriers identified to complete the program.    Expected Outcomes Short Term: Patient will start the program and attend consistently. Long Term: Patient will complete the program meeting personal goals.    Continue Psychosocial Services  Follow up required by staff          Psychosocial Re-Evaluation:  Psychosocial Re-Evaluation     Row Name 06/17/24 [id number]             Psychosocial Re-Evaluation   Current issues with None Identified       Comments [name] identifies no stressors in his life at the moment. 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       Expected Outcomes Short: Continue to attend rehab. Long: Continue to have a stress free  lifestyle.       Interventions Encouraged to attend Cardiac Rehabilitation for the exercise       Continue Psychosocial Services  Follow up required by staff          Psychosocial Discharge (Final Psychosocial Re-Evaluation):  Psychosocial Re-Evaluation - 06/17/24 0946       Psychosocial Re-Evaluation   Current issues with None Identified    Comments [name] identifies no stressors in his life at the moment. 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    Expected Outcomes Short: Continue to attend rehab. Long: Continue to have a stress free lifestyle.    Interventions Encouraged to attend Cardiac Rehabilitation for the exercise    Continue Psychosocial Services  Follow up required by staff          Vocational Rehabilitation: Provide vocational rehab assistance to qualifying candidates.   Vocational Rehab Evaluation & Intervention:  Vocational Rehab - 05/16/24 9061       Initial Vocational Rehab Evaluation & Intervention   Assessment shows need for Vocational Rehabilitation No      Vocational Rehab Re-Evaulation   Comments Disabled.          Education: Education Goals: Education classes will be provided on a weekly basis, covering required topics. Participant will state understanding/return demonstration of topics presented.  Learning Barriers/Preferences:  Learning Barriers/Preferences - 05/16/24 9061       Learning Barriers/Preferences   Learning Barriers None    Learning Preferences Written Material;Audio;Skilled Demonstration          Education Topics: Hypertension, Hypertension Reduction -Define heart  disease and high blood pressure. Discus how high blood pressure affects the body and ways to reduce high blood pressure.   Exercise and Your Heart -Discuss why it is important to exercise, the FITT principles of exercise, normal and abnormal responses to  exercise, and how to exercise safely.   Angina -Discuss definition of angina, causes of angina, treatment of angina, and how to decrease risk of having angina. Flowsheet Row CARDIAC REHAB PHASE II EXERCISE from 06/12/2024 in [place] CARD[facility]AC [facility]ITATION  Date 05/22/24  Educator [facility]  Instruction Review Code 1- Verbalizes Understanding    Cardiac Medications -Review what the following cardiac medications are used for, how they affect the body, and side effects that may occur when taking the medications.  Medications include Aspirin , Beta blockers, calcium  channel blockers, ACE Inhibitors, angiotensin receptor blockers, diuretics, digoxin, and antihyperlipidemics.   Congestive Heart Failure -Discuss the definition of CHF, how to live with CHF, the signs and symptoms of CHF, and how keep track of weight and sodium intake.   Heart Disease and Intimacy -Discus the effect sexual activity has on the heart, how changes occur during intimacy as we age, and safety during sexual activity. Flowsheet Row CARDIAC REHAB PHASE II EXERCISE from 06/12/2024 in [place] [facility]  Date 06/12/24  Educator [facility]  Instruction Review Code 1- Verbalizes Understanding    Smoking Cessation / COPD -Discuss different methods to quit smoking, the health benefits of quitting smoking, and the definition of COPD.   Nutrition I: Fats -Discuss the types of cholesterol, what cholesterol does to the heart, and how cholesterol levels can be controlled.   Nutrition II: Labels -Discuss the different components of food labels and how to read food label   Heart Parts/Heart Disease and PAD -Discuss the anatomy of the heart, the pathway of blood circulation through the heart, and these are affected by heart disease.   Stress I: Signs and Symptoms -Discuss the causes of stress, how stress may lead to anxiety and depression, and ways to limit stress.   Stress II: Relaxation -Discuss different types of  relaxation techniques to limit stress.   Warning Signs of Stroke / TIA -Discuss definition of a stroke, what the signs and symptoms are of a stroke, and how to identify when someone is having stroke.   Knowledge Questionnaire Score:  Knowledge Questionnaire Score - 05/20/24 1043       Knowledge Questionnaire Score   Pre Score 25/28          Core Components/Risk Factors/Patient Goals at Admission:  Personal Goals and Risk Factors at Admission - 05/20/24 1123       Core Components/Risk Factors/Patient Goals on Admission    Weight Management Weight Loss;Yes    Intervention Weight Management: Develop a combined nutrition and exercise program designed to reach desired caloric intake, while maintaining appropriate intake of nutrient and fiber, sodium and fats, and appropriate energy expenditure required for the weight goal.;Weight Management: Provide education and appropriate resources to help participant work on and attain dietary goals.;Weight Management/Obesity: Establish reasonable short term and long term weight goals.    Admit Weight 226 lb 8 oz (102.7 kg)    Goal Weight: Short Term 221 lb (100.2 kg)    Goal Weight: Long Term 216 lb (98 kg)    Expected Outcomes Short Term: Continue to assess and modify interventions until short term weight is achieved;Long Term: Adherence to nutrition and physical activity/exercise program aimed toward attainment of established weight goal;Weight Loss: Understanding of general  recommendations for a balanced deficit meal plan, which promotes 1-2 lb weight loss per week and includes a negative energy balance of [phone] kcal/d;Understanding recommendations for meals to include 15-35% energy as protein, 25-35% energy from fat, 35-60% energy from carbohydrates, less than 200mg  of dietary cholesterol, 20-35 gm of total fiber daily;Understanding of distribution of calorie intake throughout the day with the consumption of 4-5 meals/snacks    Improve shortness  of breath with ADL's Yes    Intervention Provide education, individualized exercise plan and daily activity instruction to help decrease symptoms of SOB with activities of daily living.    Expected Outcomes Short Term: Improve cardiorespiratory fitness to achieve a reduction of symptoms when performing ADLs;Long Term: Be able to perform more ADLs without symptoms or delay the onset of symptoms    Hypertension Yes    Intervention Provide education on lifestyle modifcations including regular physical activity/exercise, weight management, moderate sodium restriction and increased consumption of fresh fruit, vegetables, and low fat dairy, alcohol moderation, and smoking cessation.;Monitor prescription use compliance.    Expected Outcomes Short Term: Continued assessment and intervention until BP is < 140/69mm HG in hypertensive participants. < 130/62mm HG in hypertensive participants with diabetes, heart failure or chronic kidney disease.;Long Term: Maintenance of blood pressure at goal levels.    Lipids Yes    Intervention Provide education and support for participant on nutrition & aerobic/resistive exercise along with prescribed medications to achieve LDL 70mg , HDL >40mg .    Expected Outcomes Short Term: Participant states understanding of desired cholesterol values and is compliant with medications prescribed. Participant is following exercise prescription and nutrition guidelines.;Long Term: Cholesterol controlled with medications as prescribed, with individualized exercise RX and with personalized nutrition plan. Value goals: LDL < 70mg , HDL > 40 mg.          Core Components/Risk Factors/Patient Goals Review:   Goals and Risk Factor Review     Row Name 06/17/24 0949             Core Components/Risk Factors/Patient Goals Review   Personal Goals Review Weight Management/Obesity;Lipids       Review [name] states he checks all his vitals regularly at home. His BP runs good, usually only 4-10  points off of what we get. His pulse ox runs about 95-97% which is good for someone who used to be a heavy smoker. Takes all medications as prescribed.       Expected Outcomes Short: Continue to attend rehab. Long: Continue checking vitals at home.          Core Components/Risk Factors/Patient Goals at Discharge (Final Review):   Goals and Risk Factor Review - 06/17/24 0949       Core Components/Risk Factors/Patient Goals Review   Personal Goals Review Weight Management/Obesity;Lipids    Review [name] states he checks all his vitals regularly at home. His BP runs good, usually only 4-10 points off of what we get. His pulse ox runs about 95-97% which is good for someone who used to be a heavy smoker. Takes all medications as prescribed.    Expected Outcomes Short: Continue to attend rehab. Long: Continue checking vitals at home.          ITP Comments:  ITP Comments     Row Name 05/16/24 [phone] 05/20/24 1143 05/22/24 0923 05/29/24 0928 06/24/24 0933   ITP Comments Virtual orientation visit completed for Cardiac rehab with S/P CABGx3. On-site orientation visit scheduled for 05/20/24 at 10. Patient arrived for 1st visit/orientation/education at 1000. Patient  was referred to CR by Dr. Linnie Rayas and attending cardiologist Peter Swaziland due to S/P CABGx3. During orientation advised patient on arrival and appointment times what to wear, what to do before, during and after exercise. Reviewed attendance and class policy.  Pt is scheduled to return Cardiac Rehab on 05/22/24 at 915. Pt was advised to come to class 15 minutes before class starts.  Discussed RPE/Dpysnea scales. Patient participated in warm up stretches. Patient was able to complete 6 minute walk test.  Telemetry:NSR. Patient was measured for the equipment. Discussed equipment safety with patient. Took patient pre-anthropometric measurements. Patient finished visit at 1045. First full day of exercise!  Patient was oriented to gym and  equipment including functions, settings, policies, and procedures.  Patient's individual exercise prescription and treatment plan were reviewed.  All starting workloads were established based on the results of the 6 minute walk test done at initial orientation visit.  The plan for exercise progression was also introduced and progression will be customized based on patient's performance and goals. 30 day review completed. ITP sent to Dr.Jonathan Branch, Medical Director of Cardiac Rehab. Continue with ITP unless changes are made by physician. New to program. He has completed 1 session since his orientation visit. He is only attending 1 day/week due to his co-payment. Patient will be out rest of week for oncology work up and biopsy.    Row Name 06/26/24 0821           ITP Comments 30 day review completed. ITP sent to Dr. Dorn Ross, Medical Director of Cardiac Rehab. Continue with ITP unless changes are made by physician.          Comments: 30 day review

## 2024-06-26 NOTE — Progress Notes (Signed)
 Dear Dr. Nieves, Here is my assessment for our mutual patient, Dave Williams. Thank you for allowing me the opportunity to care for your patient. Please do not hesitate to contact me should you have any other questions. Sincerely, Dr. Eldora Blanch  Otolaryngology Clinic Note Referring provider: Dr. Nieves HPI:  Dave Williams is a 68 y.o. male kindly referred by Dr. Nieves for evaluation of supraglottic mass and neck mass  Initial visit (06/2024): He reports that he noted a right neck mass last year, was not painful, and then went away. Then re-appeared in January 2025 (just noticed it then), and never went away. It has grown in size, some discomfort. Additional symptoms include dysphagia (sometimes) and odynophagia - comes and goes - mostly occurring with solids. Patient otherwise denies: - aspiration episodes or PNA, unintentional weight loss - changes in voice, shortness of breath, hemoptysis - ear pain   Volunteer EMT and firefighter  ENT Surgery: no Personal or FHx of bleeding dz or anesthesia difficulty: no  GLP-1: no AP/AC: ASA 81, and Eliquis   Tobacco: prior, quit 50 pack year. Alcohol: prior heavy use, quit 15 years ago.  PMHx: CAD s/p CABG (March 2025), HTN, HLD Back pain on Hydrocodone   Independent Review of Additional Tests or Records:  CT Neck 06/16/2024 independently interpreted: noted bulky right necrotic lymph nodes level 3, unclear if ENE but appears to be concerning re: tissue planes on more superior node; right AE fold mass it appears, with prominent lingual tonsils as well; do not see obvious cricoid or thyroid  cartilage invasion; overall, concerning for carcinoma. Referral notes reviewed CBC and CMP 06/19/2024: WBC 8.7, Eos 200; BUN 19/1.34  PMH/Meds/All/SocHx/FamHx/ROS:   Past Medical History:  Diagnosis Date   Chronic left hip pain    Compression fracture of spine (HCC)    multiple compression Fx with disc bulge   Coronary artery disease     Hypertension    Pneumonia    As a teenager   Stroke [facility]) 2022   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     Past Surgical History:  Procedure Laterality Date   BACK SURGERY     Radiocartography T2-T12   CORONARY ARTERY BYPASS GRAFT N/A 03/04/2024   Procedure: CORONARY ARTERY BYPASS GRAFTING (CABG) TIMES THREE UTILIZING LEFT INTERNAL MAMMARY ARTERY AND ENDOSCOPIC VEIN HARVEST RIGHT GREATER SAPHENOUS VEIN;  Surgeon: Shyrl Linnie KIDD, MD;  Location: [facility] OR;  Service: Open Heart Surgery;  Laterality: N/A;   KNEE ARTHROSCOPY Bilateral    LEFT HEART CATH AND CORONARY ANGIOGRAPHY N/A 11/28/2023   Procedure: LEFT HEART CATH AND CORONARY ANGIOGRAPHY;  Surgeon: Swaziland, Peter M, MD;  Location: [facility] INVASIVE CV LAB;  Service: Cardiovascular;  Laterality: N/A;   PILONIDAL CYST EXCISION     In patients 20s   TEE WITHOUT CARDIOVERSION N/A 03/04/2024   Procedure: ECHOCARDIOGRAM, TRANSESOPHAGEAL;  Surgeon: Shyrl Linnie KIDD, MD;  Location: [facility] OR;  Service: Open Heart Surgery;  Laterality: N/A;    Family History  Problem Relation Age of Onset   Cancer Mother    Hypertension Mother    Heart failure Mother    Cancer Father    Hypertension Father    Heart failure Father    Heart disease Father      Social Connections: Moderately Integrated (03/05/2024)   Social Connection and Isolation Panel    Frequency of Communication with Friends and Family: More than three times a week  Frequency of Social Gatherings with Friends and Family: More than three times a week    Attends Religious Services: More than 4 times per year    Active Member of Clubs or Organizations: Yes    Attends Banker Meetings: 1 to 4 times per year    Marital Status: Divorced      Current Outpatient Medications:    amiodarone  (PACERONE ) 200 MG tablet, Take 1 tablet by mouth daily., Disp: , Rfl:    amLODipine  (NORVASC ) 10 MG tablet, Take 1 tablet (10  mg total) by mouth daily., Disp: 90 tablet, Rfl: 2   apixaban  (ELIQUIS ) 5 MG TABS tablet, Take 1 tablet (5 mg total) by mouth 2 (two) times daily., Disp: 60 tablet, Rfl: 0   aspirin  EC 81 MG tablet, Take 1 tablet (81 mg total) by mouth daily. Swallow whole., Disp: 30 tablet, Rfl: 0   atorvastatin  (LIPITOR ) 20 MG tablet, Take 20 mg by mouth every morning., Disp: , Rfl:    atorvastatin  (LIPITOR ) 80 MG tablet, Take 1 tablet (80 mg total) by mouth daily with supper., Disp: 90 tablet, Rfl: 3   HYDROcodone -acetaminophen  (NORCO/VICODIN) 5-325 MG tablet, Take 1 tablet by mouth every 6 (six) hours as needed for moderate pain (pain score 4-6)., Disp: 56 tablet, Rfl: 0   isosorbide  mononitrate (IMDUR ) 30 MG 24 hr tablet, Take 1 tablet (30 mg total) by mouth daily., Disp: 90 tablet, Rfl: 1   metoprolol  succinate (TOPROL -XL) 25 MG 24 hr tablet, Take 25 mg by mouth daily., Disp: , Rfl:    nitroGLYCERIN  (NITROSTAT ) 0.4 MG SL tablet, Place 1 tablet (0.4 mg total) under the tongue every 5 (five) minutes as needed for chest pain. DISSOLVE 1 TABLET UNDER THE TONGUE EVERY 5 MINUTES AS NEEDED FOR CHEST PAIN. DO NOT EXCEED A TOTAL OF 3 DOSES IN 15 MINUTES., Disp: 25 tablet, Rfl: 6   pantoprazole  (PROTONIX ) 40 MG tablet, Take 1 tablet (40 mg total) by mouth daily., Disp: 30 tablet, Rfl: 0   vitamin D3 (CHOLECALCIFEROL ) 25 MCG tablet, Take 1 tablet (1,000 Units total) by mouth daily., Disp: 30 tablet, Rfl: 0   Physical Exam:   BP 129/77 (BP Location: Left Arm, Patient Position: Sitting, Cuff Size: Large)   Pulse 79   Ht 6' 1 (1.854 m)   Wt 230 lb (104.3 kg)   SpO2 92%   BMI 30.34 kg/m   Salient findings:  CN II-XII intact Bilateral EAC clear and TM intact with well pneumatized middle ear spaces Anterior rhinoscopy: Septum intact; bilateral inferior turbinates without significant hypertrophy No lesions of oral cavity/oropharynx; dentition mostly intact No obviously palpable neck masses/lymphadenopathy/thyromegaly  EXCEPT relatively fixed right level 2 and 3 masses No respiratory distress or stridor; easily lays flat; voice strong; TFL was indicated to better evaluate the proximal airway, given the patient's history and exam findings, and is detailed below.  Seprately Identifiable Procedures:  Prior to initiating any procedures, risks/benefits/alternatives were explained to the patient and verbal consent obtained. Procedure Note Pre-procedure diagnosis: Hypopharynx mass, neck mass, concern for malignancy Post-procedure diagnosis: Same Procedure: Transnasal Fiberoptic Laryngoscopy, CPT 31575 - Mod 25 Indication: see above Complications: None apparent EBL: 0 mL  The procedure was undertaken to further evaluate the patient's complaint above, with mirror exam inadequate for appropriate examination due to gag reflex and poor patient tolerance  Procedure:  Patient was identified as correct patient. Verbal consent was obtained. The nose was sprayed with oxymetazoline and 4% lidocaine . The The flexible laryngoscope was passed  through the nose to view the nasal cavity, pharynx (oropharynx, hypopharynx) and larynx.  The larynx was examined at rest and during multiple phonatory tasks. Documentation was obtained and reviewed with patient. The scope was removed. The patient tolerated the procedure well.  Findings: The nasal cavity and nasopharynx did not reveal any masses or lesions, mucosa appeared to be without obvious lesions. The tongue base, pharyngeal walls, piriform sinuses, vallecula, epiglottis and postcricoid region are normal in appearance EXCEPT - right hypopharynx mass which appears to involve the pyriform and likely arytenoid submucosally. The visualized portion of the subglottis and proximal trachea is widely patent. The vocal folds are mobile bilaterally. There are no lesions on the free edge of the vocal folds.    Electronically signed by: Eldora KATHEE Blanch, MD 06/26/2024 9:32 AM   Impression & Plans:   Dave Williams is a 68 y.o. male with:  1. Mass of hypopharynx   2. 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 Needs expedited biopsy. Messaged Dr. Court for Eliquis  - ideally hold 2 days prior, resume day after. If unable to hold, will proceed without Will schedule DL/Bx  [phone]: Best contact number.   See below regarding exact medications prescribed this encounter including dosages and route: No orders of the defined types were placed in this encounter.     Thank you for allowing me the opportunity to care for your patient. Please do not hesitate to contact me should you have any other questions.  Sincerely, Eldora Blanch, MD Otolaryngologist (ENT), [facility] Phone: [phone] Fax: [phone]  06/26/2024, 9:31 AM   MDM:  Level 4 - [phone] Complexity/Problems addressed: mod - new problem, unknown diagnosis Data complexity: mod - independent review of notes, labs, independent CT interpretation - Morbidity: mod - decision for surgery  - Prescription Drug prescribed or managed: no

## 2024-06-28 ENCOUNTER — Encounter (HOSPITAL_COMMUNITY)

## 2024-06-28 NOTE — Telephone Encounter (Signed)
   Patient Name: Dave Williams  DOB: 02/05/1956 MRN: [id number]  Primary Cardiologist: Dorn Lesches, MD  Chart reviewed as part of pre-operative protocol coverage. Received request for medication clearance for patient pending colonoscopy.   Per pharmacy:  What type of surgery is being performed? COLONOSCOPY   When is surgery scheduled? TBD     CHA2DS2-VASc Score = 5   This indicates a 7.2% annual risk of stroke. The patient's score is based upon: CHF History: 0 HTN History: 1 Diabetes History: 0 Stroke History: 2 Vascular Disease History: 1 Age Score: 1 Gender Score: 0   Chart notes brain scan w/episode of Bell's Palsy in 2022, no stroke at that time, but evidence of past stroke   CrCl 81             Platelet count 265   Patient has not had an Afib/aflutter ablation within the last 3 months or DCCV within the last 30 days   Per office protocol, patient can hold Eliquis  for 2 days prior to procedure.   Patient will not need bridging with Lovenox  (enoxaparin ) around procedure.  Will fax above recommendations to requesting service and close encounter.   Artist Pouch, PA-C 06/28/2024, 3:09 PM

## 2024-06-28 NOTE — Telephone Encounter (Signed)
 Patient with diagnosis of atrial fibrillation on Eliquis  for anticoagulation.    What type of surgery is being performed? COLONOSCOPY   When is surgery scheduled? TBD   CHA2DS2-VASc Score = 5   This indicates a 7.2% annual risk of stroke. The patient's score is based upon: CHF History: 0 HTN History: 1 Diabetes History: 0 Stroke History: 2 Vascular Disease History: 1 Age Score: 1 Gender Score: 0   Chart notes brain scan w/episode of Bell's Palsy in 2022, no stroke at that time, but evidence of past stroke  CrCl 81  Platelet count 265  Patient has not had an Afib/aflutter ablation within the last 3 months or DCCV within the last 30 days  Per office protocol, patient can hold Eliquis  for 2 days prior to procedure.   Patient will not need bridging with Lovenox  (enoxaparin ) around procedure.  **This guidance is not considered finalized until pre-operative APP has relayed final recommendations.**

## 2024-07-01 ENCOUNTER — Encounter (HOSPITAL_COMMUNITY)

## 2024-07-01 NOTE — Telephone Encounter (Signed)
 Called patient. He wants to hold off at the moment on scheduling colonoscopy. 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 He will call when he is ready to schedule. FYI

## 2024-07-01 NOTE — Telephone Encounter (Signed)
 Please advice to hold Eliquis  for 2 days thanks

## 2024-07-01 NOTE — Telephone Encounter (Signed)
 The aspirate unfortunate.  Please remind him that it will be ideal to schedule this colonoscopy within 3 months after having the positive testing to avoid delayed detection of colon cancer. Thanks

## 2024-07-02 ENCOUNTER — Encounter (HOSPITAL_COMMUNITY): Payer: Self-pay | Admitting: General Practice

## 2024-07-02 ENCOUNTER — Other Ambulatory Visit: Payer: Self-pay

## 2024-07-02 NOTE — Progress Notes (Signed)
 SDW CALL  Patient was given pre-op instructions over the phone. The opportunity was given for the patient to ask questions. No further questions asked. Patient verbalized understanding of instructions given.   PCP -  Geni Clause, FNP Cardiologist - Dr. Dorn Lesches  PPM/ICD - denies    Chest x-ray - 05/14/24 EKG - 03/21/24 Stress Test -  ECHO - 03/04/24 Cardiac Cath - 11/28/23  Sleep Study - denies  No DM  Last dose of GLP1 agonist-  n/a GLP1 instructions:  n/a  Blood Thinner Instructions: last dose of Eliquis  was 7/20 Aspirin  Instructions: last dose of Aspirin  7/22  ERAS Protcol - clears until 1245 PRE-SURGERY Ensure or G2- n/a  COVID TEST- n/a   Anesthesia review: yes - cardiac history  Patient denies shortness of breath, fever, cough and chest pain over the phone call   All instructions explained to the patient, with a verbal understanding of the material. Patient agrees to go over the instructions while at home for a better understanding.    Patient states that he did take 1 nitroglycerin  after moving a recliner on 7/21.  Stated that after taking, chest pain went away.  Anesthesia made aware.  Cardiologist is aware that patient has had some angina in the evenings and was started back on amlodipine .

## 2024-07-02 NOTE — Anesthesia Preprocedure Evaluation (Signed)
 Anesthesia Evaluation    Airway       Comment: Previous grade I view with glidescope 4, easy mask Dental   Pulmonary former smoker          Cardiovascular hypertension (amlodipine , ISMN, metoprolol ), Pt. on medications and Pt. on home beta blockers + CAD and + CABG (03/04/2024)  + dysrhythmias (iRBBB, prolonged QT) Atrial Fibrillation   HLD  LHC 11/28/2023: 1. 3 vessel obstructive CAD. Suspect the culprit is the mid LCx bifurcation lesion with OM2 and OM3 2. Normal LVEDP 3. Normal LV function  TTE 11/30/2023: IMPRESSIONS    1. Left ventricular ejection fraction, by estimation, is 65 to 70%. The  left ventricle has normal function. The left ventricle has no regional  wall motion abnormalities. Left ventricular diastolic parameters are  consistent with Grade I diastolic  dysfunction (impaired relaxation).   2. Right ventricular systolic function is normal. The right ventricular  size is normal.   3. The mitral valve is normal in structure. Trivial mitral valve  regurgitation. No evidence of mitral stenosis.   4. The aortic valve is tricuspid. There is mild calcification of the  aortic valve. Aortic valve regurgitation is not visualized. Aortic valve  sclerosis/calcification is present, without any evidence of aortic  stenosis.   5. The inferior vena cava is normal in size with greater than 50%  respiratory variability, suggesting right atrial pressure of 3 mmHg.     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  Endo/Other    Renal/GU      Musculoskeletal   Abdominal   Peds  Hematology Lab Results      Component                Value               Date                      WBC                      8.7                 06/19/2024                HGB                      14.3                06/19/2024                 HCT                      43.0                06/19/2024                MCV                      86.3                06/19/2024                PLT                      265  06/19/2024              Anesthesia Other Findings Mass of hypopharynx  Last Eliquis :  Reproductive/Obstetrics                              Anesthesia Physical Anesthesia Plan  ASA: 3  Anesthesia Plan: General   Post-op Pain Management:    Induction: Intravenous  PONV Risk Score and Plan: 2 and Ondansetron , Dexamethasone and Treatment may vary due to age or medical condition  Airway Management Planned: Oral ETT and Video Laryngoscope Planned  Additional Equipment:   Intra-op Plan:   Post-operative Plan:   Informed Consent:      Dental advisory given  Plan Discussed with: CRNA and Anesthesiologist  Anesthesia Plan Comments: (The patient reported that he had exertional chest pain yesterday that radiated to his jaw and shoulder when he was moving a recliner. It resolved with nitroglycerin . He has only taken nitroglycerin  one other time about a month and a half ago. In preop, he stated he was having the same pain again but thinks it was related to anxiety. The pain went away without treatment. VSS. 12-lead ECG performed in preop was similar to his previous ECG in April. 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 I called Cardiology and spoke with Dr. Ladona. Since the patient was no longer having chest pain, he recommended that the patient be seen as an outpatient but that if he were to have recurrent chest pain, he should then go to the ED. They will call to have him seen. The patient is comfortable going home as he is no longer having chest pain, ECG was unchanged, and VSS. He was instructed to call to schedule an appointment if he does not hear from cardiology. He was also instructed to go to the ED if he were to develop chest  pain again. The patient expressed understanding.   PAT note by Lynwood Hope, PA-C: 68 year old male follows with cardiology for history of former smoker (quit 02/2024), HTN, HLD, TIA, paroxysmal A-fib, CAD s/p CABG x 3 with a LIMA to LAD, vein to the PDA and OM branches.  Postop course was uncomplicated except for some PAF.  He had normal LV function.  He reported some nocturnal angina which improved with the addition of amlodipine  and Imdur .  He was last seen by Dr. Court on 04/22/2024 and noted to be maintaining sinus rhythm on Eliquis  and amiodarone .  Amiodarone  was decreased from 200 to 100 mg/day.  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  Patient recently seen by oncology for lymphadenopathy.  Ultrasound of the neck on 06/05/2024 showed enlarged lymph nodes in the right superior cervical chain measuring 5.1 x 2.1 x 3.0 cm and 2.5 x 1.4 x 1.6 cm.  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  He was urgently referred to ENT for biopsy of the supraglottic mass.  He was subsequently seen by Dr. Tobie on 06/26/2024.  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 Needs expedited biopsy. Messaged Dr. Court for Eliquis  - ideally hold 2 days prior, resume day after. If unable to hold, will proceed without.  Will schedule DL/Bx.  Patient reports last dose of Eliquis  06/30/2024 in the evening.  History of GERD, maintained on PPI.  Difficult airway noted 03/04/2024 due to anterior larynx and limited oral opening.  Glide scope used electively.  CMP and CBC 06/19/2024 reviewed, creatinine mildly elevated at 1.34, otherwise unremarkable.  Patient will need day of surgery evaluation.  EKG 03/21/24: Normal sinus  rhythm. Incomplete right bundle branch block. T wave inversion Anterior ischemia. Prolonged QT (QTcB 472)  Intraoperative TEE 03/04/2024: Complications: No known complications during this procedure.  POST-OP IMPRESSIONS  _ Left Ventricle: The left ventricle is unchanged from pre-bypass.  _ Right Ventricle: The right ventricle appears unchanged from pre-bypass.  _ Aorta: The aorta appears unchanged from pre-bypass.  _ Left Atrium: The left atrium appears unchanged from pre-bypass.  _ Left Atrial Appendage: The left atrial appendage appears unchanged from  pre-bypass.  _ Aortic Valve: The aortic valve appears unchanged from pre-bypass.  _ Mitral Valve: The mitral valve appears unchanged from pre-bypass.  _ Tricuspid Valve: The tricuspid valve appears unchanged from pre-bypass.  _ Pulmonic Valve: The pulmonic valve appears unchanged from pre-bypass.  _ Interatrial Septum: The interatrial septum appears unchanged from  pre-bypass.  _ Interventricular Septum: The interventricular septum appears unchanged  from  pre-bypass.  _ Pericardium: The pericardium appears unchanged from pre-bypass.    TTE 11/30/2023: 1. Left ventricular ejection fraction, by estimation, is 65 to 70%. The  left ventricle has normal function. The left ventricle has no regional  wall motion abnormalities. Left ventricular diastolic parameters are  consistent with Grade I diastolic  dysfunction (impaired relaxation).   2. Right ventricular systolic function is normal. The right ventricular  size is normal.   3. The mitral valve is normal in structure. Trivial mitral valve  regurgitation. No evidence of mitral stenosis.   4. The aortic valve is tricuspid. There is mild calcification of the  aortic valve. Aortic valve regurgitation is not visualized. Aortic valve  sclerosis/calcification is present, without any evidence of aortic  stenosis.   5. The inferior vena cava is normal in size with greater than 50%   respiratory variability, suggesting right atrial pressure of 3 mmHg.   )         Anesthesia Quick Evaluation

## 2024-07-02 NOTE — Progress Notes (Signed)
 Anesthesia Chart Review: Same day workup  68 year old male follows with cardiology for history of former smoker (quit 02/2024), HTN, HLD, TIA, paroxysmal A-fib, CAD s/p CABG x 3 with a LIMA to LAD, vein to the PDA and OM branches.  Postop course was uncomplicated except for some PAF.  He had normal LV function.  He reported some nocturnal angina which improved with the addition of amlodipine  and Imdur .  He was last seen by Dr. Court on 04/22/2024 and noted to be maintaining sinus rhythm on Eliquis  and amiodarone .  Amiodarone  was decreased from 200 to 100 mg/day.  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  Patient recently seen by oncology for lymphadenopathy.  Ultrasound of the neck on 06/05/2024 showed enlarged lymph nodes in the right superior cervical chain measuring 5.1 x 2.1 x 3.0 cm and 2.5 x 1.4 x 1.6 cm.  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  He was urgently referred to ENT for biopsy of the supraglottic mass.  He was subsequently seen by Dr. Tobie on 06/26/2024.  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 Needs expedited biopsy. Messaged Dr. Court for Eliquis  - ideally hold 2 days prior, resume day after. If unable to hold, will proceed without. Will schedule DL/Bx.  Patient reports last dose of Eliquis  06/30/2024 in the evening.  History of GERD, maintained on PPI.  Difficult airway noted 03/04/2024 due to anterior larynx and limited oral opening.  Glide scope used electively.  CMP and CBC 06/19/2024 reviewed, creatinine mildly elevated at 1.34, otherwise unremarkable.  Patient will need day of surgery evaluation.  EKG 03/21/24: Normal sinus rhythm. Incomplete right bundle branch  block. T wave inversion Anterior ischemia. Prolonged QT (QTcB 472)  Intraoperative TEE 03/04/2024: Complications: No known complications during this procedure.  POST-OP IMPRESSIONS  _ Left Ventricle: The left ventricle is unchanged from pre-bypass.  _ Right Ventricle: The right ventricle appears unchanged from pre-bypass.  _ Aorta: The aorta appears unchanged from pre-bypass.  _ Left Atrium: The left atrium appears unchanged from pre-bypass.  _ Left Atrial Appendage: The left atrial appendage appears unchanged from  pre-bypass.  _ Aortic Valve: The aortic valve appears unchanged from pre-bypass.  _ Mitral Valve: The mitral valve appears unchanged from pre-bypass.  _ Tricuspid Valve: The tricuspid valve appears unchanged from pre-bypass.  _ Pulmonic Valve: The pulmonic valve appears unchanged from pre-bypass.  _ Interatrial Septum: The interatrial septum appears unchanged from  pre-bypass.  _ Interventricular Septum: The interventricular septum appears unchanged  from  pre-bypass.  _ Pericardium: The pericardium appears unchanged from pre-bypass.    TTE 11/30/2023: 1. Left ventricular ejection fraction, by estimation, is 65 to 70%. The  left ventricle has normal function. The left ventricle has no regional  wall motion abnormalities. Left ventricular diastolic parameters are  consistent with Grade I diastolic  dysfunction (impaired relaxation).   2. Right ventricular systolic function is normal. The right ventricular  size is normal.   3. The mitral valve is normal in structure. Trivial mitral valve  regurgitation. No evidence of mitral stenosis.   4. The aortic valve is tricuspid. There is mild calcification of the  aortic valve. Aortic valve regurgitation is not visualized. Aortic valve  sclerosis/calcification is present, without any  evidence of aortic  stenosis.   5. The inferior vena cava is normal in size with greater than 50%  respiratory variability, suggesting right  atrial pressure of 3 mmHg.     Lynwood Geofm RIGGERS [facility] Short Stay Center/Anesthesiology Phone [phone] 07/02/2024 11:06 AM

## 2024-07-02 NOTE — Telephone Encounter (Signed)
 Called pt, LMTCB to make aware

## 2024-07-03 ENCOUNTER — Encounter (HOSPITAL_COMMUNITY)
Admission: RE | Disposition: A | Discharge status: Home / Self Care | Payer: Self-pay | Source: Home / Self Care | Attending: Otolaryngology

## 2024-07-03 ENCOUNTER — Ambulatory Visit (HOSPITAL_COMMUNITY)
Admission: RE | Admit: 2024-07-03 | Discharge: 2024-07-03 | Disposition: A | Discharge status: Home / Self Care | Attending: Otolaryngology | Admitting: Otolaryngology

## 2024-07-03 ENCOUNTER — Other Ambulatory Visit: Payer: Self-pay

## 2024-07-03 ENCOUNTER — Encounter (HOSPITAL_COMMUNITY): Payer: Self-pay | Admitting: Physician Assistant

## 2024-07-03 ENCOUNTER — Encounter (HOSPITAL_COMMUNITY)

## 2024-07-03 ENCOUNTER — Encounter (HOSPITAL_COMMUNITY): Payer: Self-pay

## 2024-07-03 DIAGNOSIS — R079 Chest pain, unspecified: Secondary | ICD-10-CM | POA: Diagnosis not present

## 2024-07-03 DIAGNOSIS — Z539 Procedure and treatment not carried out, unspecified reason: Secondary | ICD-10-CM | POA: Diagnosis not present

## 2024-07-03 DIAGNOSIS — J392 Other diseases of pharynx: Secondary | ICD-10-CM | POA: Diagnosis present

## 2024-07-03 SURGERY — LARYNGOSCOPY, DIRECT
Anesthesia: General

## 2024-07-03 MED ORDER — LACTATED RINGERS IV SOLN: INTRAVENOUS | Status: DC

## 2024-07-03 MED ORDER — PROPOFOL 10 MG/ML IV BOLUS
INTRAVENOUS | Status: AC
  Filled 2024-07-03: qty 20

## 2024-07-03 MED ORDER — CHLORHEXIDINE GLUCONATE 0.12 % MT SOLN
15.0000 mL | Freq: Once | OROMUCOSAL | Status: DC
  Filled 2024-07-03: qty 15

## 2024-07-03 MED ORDER — MIDAZOLAM HCL 2 MG/2ML IJ SOLN
INTRAMUSCULAR | Status: AC
Start: 2024-07-03 — End: 2024-07-03
  Filled 2024-07-03: qty 2

## 2024-07-03 MED ORDER — ACETAMINOPHEN 500 MG PO TABS
1000.0000 mg | ORAL_TABLET | Freq: Once | ORAL | Status: DC
  Filled 2024-07-03: qty 2

## 2024-07-03 MED ORDER — ORAL CARE MOUTH RINSE: 15.0000 mL | Freq: Once | OROMUCOSAL | Status: DC

## 2024-07-03 MED ORDER — FENTANYL CITRATE (PF) 250 MCG/5ML IJ SOLN
INTRAMUSCULAR | Status: AC
  Filled 2024-07-03: qty 5

## 2024-07-03 NOTE — Progress Notes (Signed)
 Pt c/o intermittent chest and jaw pain rated 4/10. Pt stated that he took sublingual nitroglycerin  yesterday after lifting a sofa which caused immediate cp. Pt stated he took 1 nitroglycerin  with pain resolving within 5 minutes. Dr. Peggye made aware. STAT EKG performed. No signs of acute MI. Per Dr. Peggye, surgery will be cancelled until cleared by cardiology. OR made aware.   Joesph LOISE Stake, RN

## 2024-07-03 NOTE — Progress Notes (Signed)
 Per Dr Peggye, Cardiology will not be able to evaluate patient today, so asked that patient to go home, follow up with cardiology, and go to the ED if chest pain symptoms persist or increase.

## 2024-07-04 ENCOUNTER — Telehealth: Payer: Self-pay | Admitting: Cardiology

## 2024-07-04 ENCOUNTER — Telehealth: Payer: Self-pay | Admitting: *Deleted

## 2024-07-04 NOTE — Telephone Encounter (Signed)
 I received a call from anesthesiologist Dr. Delon Dawn as patient was being evaluated in the preop, mention chest discomfort with radiation to the neck that occurred 1 day prior to the preop evaluation while he was doing physical activity, also stated that he was having some discomfort similarly and was just sitting when she was examining him.  Patient is scheduled for direct laryngoscopy and biopsy with ENT under general anesthesia.  I called the patient today and discussed over the telephone regarding his symptoms.  Patient states that he has been extremely nervous about the procedure and does not seem to think that this is related to his cardiac issue and symptoms were different.  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  He is also scheduled for cardiac rehab visit tomorrow.  I advised him that we can proceed with surgery with low risk and no office visit is required unless he starts having chest discomfort.  Surgery is scheduled in 3 weeks, he can be taken up for the surgery with low risk as he has resumed all his activities and has not had any chest discomfort.  Patient is also aware to call us  if he were to develop recurrence of chest pain or he needs nitroglycerin .  Reassuring part is that he is in cardiac rehab and actively participating in cardiac rehab and if he were to develop anginal symptoms I am sure we will be notified.  Patient also states that he has been going to the gym 3 days a week and has not had any chest discomfort or chest pain.  Otherwise patient will keep his usual appointment in August with cardiology.     Gordy Bergamo, MD, [facility] 07/04/2024, 1:07 PM [facility] [address] [place], [place] [address] Phone: [phone]. Fax:  [phone]

## 2024-07-04 NOTE — Telephone Encounter (Signed)
   Pre-operative Risk Assessment    Patient Name: Dave Williams  DOB: 1956/01/22 MRN: [id number]   Date of last office visit: 04/22/24 DR. BERRY Date of next office visit: 08/06/24 [name], NP  NOTES PER CLEARANCE REQUEST: PATIENT C/O CHEST PAIN   Request for Surgical Clearance    Procedure:  DIRECT LARYNGOSCOPY WITH Bx WITH USE OF OPERATING TELESCOPE   Date of Surgery:  Clearance 07/24/24                                Surgeon:  DR. ELDORA PATEL Surgeon's Group or Practice Name:  [facility] Phone number:  [phone] Fax number:  [phone]   Type of Clearance Requested:   - Medical  - Pharmacy:  Hold Apixaban  (Eliquis )     Type of Anesthesia:  General    Additional requests/questions:    Bonney Niels Jest   07/04/2024, 10:40 AM

## 2024-07-05 ENCOUNTER — Telehealth: Payer: Self-pay | Admitting: Orthopaedic Surgery

## 2024-07-05 ENCOUNTER — Encounter (HOSPITAL_COMMUNITY)

## 2024-07-08 ENCOUNTER — Encounter: Payer: Self-pay | Admitting: Medical Oncology

## 2024-07-08 ENCOUNTER — Encounter (HOSPITAL_COMMUNITY)

## 2024-07-08 NOTE — Telephone Encounter (Signed)
   Name: Dave Williams  DOB: 11/24/1956  MRN: [id number]   Primary Cardiologist: Dorn Lesches, MD  Chart reviewed as part of pre-operative protocol coverage. Per note from Dr. Ladona dated 07/04/2024: I received a call from anesthesiologist Dr. Delon Dawn as patient was being evaluated in the preop, mention chest discomfort with radiation to the neck that occurred 1 day prior to the preop evaluation while he was doing physical activity, also stated that he was having some discomfort similarly and was just sitting when she was examining him.  Patient is scheduled for direct laryngoscopy and biopsy with ENT under general anesthesia.   I called the patient today and discussed over the telephone regarding his symptoms.  Patient states that he has been extremely nervous about the procedure and does not seem to think that this is related to his cardiac issue and symptoms were different.   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   He is also scheduled for cardiac rehab visit tomorrow.   I advised him that we can proceed with surgery with low risk and no office visit is required unless he starts having chest discomfort.  Surgery is scheduled in 3 weeks, he can be taken up for the surgery with low risk as he has resumed all his activities and has not had any chest discomfort.   Patient is also aware to call us  if he were to develop recurrence of chest pain or he needs nitroglycerin .  Reassuring part is that he is in cardiac rehab and actively participating in cardiac rehab and if he were to develop anginal symptoms I am sure we will be notified.   Patient also states that he has been going to the gym 3 days a week and has not had any chest discomfort or chest pain.   Otherwise patient will keep his usual appointment in August with cardiology.  Per Pharm D, patient  has not had an Afib/aflutter ablation within the last 3 months or DCCV within the last 30 days. Patient may hold Eliquis  for 2 days prior to procedure.  Patient will not need bridging with Lovenox  around procedure.    I will route this recommendation to the requesting party via Epic fax function and remove from pre-op pool. Please call with questions.  Barnie Hila, NP 07/08/2024, 3:37 PM

## 2024-07-08 NOTE — Telephone Encounter (Signed)
 Patient with diagnosis of atrial fibrillation  on Eliquis  for anticoagulation.    Procedure:  DIRECT LARYNGOSCOPY WITH Bx WITH USE OF OPERATING TELESCOPE    Date of Surgery:  Clearance 07/24/24      CHA2DS2-VASc Score = 5   This indicates a 7.2% annual risk of stroke. The patient's score is based upon: CHF History: 0 HTN History: 1 Diabetes History: 0 Stroke History: 2 Vascular Disease History: 1 Age Score: 1 Gender Score: 0   Per chart pt had head CT 11/22 that showed likely old lacunar infarct  CrCl 80  Platelet count 265    Patient has not had an Afib/aflutter ablation within the last 3 months or DCCV within the last 30 days   Per office protocol, patient can hold Eliquis  for 2 days prior to procedure.   Patient will not need bridging with Lovenox  (enoxaparin ) around procedure.  **This guidance is not considered finalized until pre-operative APP has relayed final recommendations.**

## 2024-07-09 ENCOUNTER — Telehealth (INDEPENDENT_AMBULATORY_CARE_PROVIDER_SITE_OTHER): Payer: Self-pay

## 2024-07-09 ENCOUNTER — Telehealth (INDEPENDENT_AMBULATORY_CARE_PROVIDER_SITE_OTHER): Payer: Self-pay | Admitting: Otolaryngology

## 2024-07-09 MED ORDER — HYDROXYZINE PAMOATE 25 MG PO CAPS: 25.0000 mg | ORAL_CAPSULE | Freq: Three times a day (TID) | ORAL | 0 refills | Status: DC | PRN

## 2024-07-09 NOTE — Telephone Encounter (Signed)
 I've sent him some atarax . Would you mind having someone call to let him know? Thank you!

## 2024-07-09 NOTE — Telephone Encounter (Signed)
-----   Message from [place] D sent at 07/04/2024  8:23 AM EDT ----- Regarding: RE: follow up/case cancelled Patient has been rescheduled to 8/13 - when speaking with the patient he requested something for his anxiety leading up to surgery. ----- Message ----- From: Tobie Eldora NOVAK, MD Sent: 07/03/2024   5:23 PM EDT To: Monico LITTIE Brunswick; Powell Barge; Neferti# Subject: follow up/case cancelled                       Hi all, This case got cancelled because of chest pain. He'll need cardiac clearance - cardiology said they will see him outpatient. Nef can you send a fax to his cardiologist Powell and breanna - can we get him a DL/Bx on Aug 13th like other person? 45 mins Thanks

## 2024-07-09 NOTE — Telephone Encounter (Signed)
 Called to let patient know that Dr. Tobie sent in a prescription for his anxiety leading up to his surgery. Gave him instructions. Patient understood.

## 2024-07-10 ENCOUNTER — Encounter (HOSPITAL_COMMUNITY)

## 2024-07-10 ENCOUNTER — Ambulatory Visit (INDEPENDENT_AMBULATORY_CARE_PROVIDER_SITE_OTHER): Admitting: Physician Assistant

## 2024-07-10 ENCOUNTER — Inpatient Hospital Stay (HOSPITAL_COMMUNITY)
Admission: RE | Admit: 2024-07-10 | Discharge: 2024-07-10 | Discharge status: Home / Self Care | Source: Ambulatory Visit | Attending: Cardiovascular Disease

## 2024-07-10 DIAGNOSIS — R590 Localized enlarged lymph nodes: Secondary | ICD-10-CM | POA: Diagnosis not present

## 2024-07-10 DIAGNOSIS — Z951 Presence of aortocoronary bypass graft: Secondary | ICD-10-CM

## 2024-07-10 NOTE — Progress Notes (Signed)
 Daily Session Note  Patient Details  Name: Erice Ahles MRN: [id number] Date of Birth: 07-Jan-1956 Referring Provider:   Flowsheet Row CARDIAC REHAB PHASE II ORIENTATION from 05/20/2024 in [facility] CARDIAC REHABILITATION  Referring Provider Court Carrier MD    Encounter Date: 07/10/2024  Check In:  Session Check In - 07/10/24 0932       Check-In   Supervising physician immediately available to respond to emergencies See telemetry face sheet for immediately available MD    Location [facility]    Staff Present Laymon Rattler, BSN, RN, Rosalba Gelineau, MA, RCEP, CCRP, CCET    Virtual Visit No    Medication changes reported     No    Fall or balance concerns reported    No    Tobacco Cessation No Change    Warm-up and Cool-down Performed on first and last piece of equipment    Resistance Training Performed Yes    VAD Patient? No    PAD/SET Patient? No      Pain Assessment   Currently in Pain? No/denies          Capillary Blood Glucose: No results found for this or any previous visit (from the past 24 hours).    Social History   Tobacco Use  Smoking Status Former   Current packs/day: 0.50   Types: Cigarettes  Smokeless Tobacco Never    Goals Met:  Independence with exercise equipment Exercise tolerated well No report of concerns or symptoms today Strength training completed today  Goals Unmet:  Not Applicable  Comments: Pt able to follow exercise prescription today without complaint.  Will continue to monitor for progression.

## 2024-07-12 ENCOUNTER — Encounter (HOSPITAL_COMMUNITY)

## 2024-07-15 ENCOUNTER — Encounter (HOSPITAL_COMMUNITY)
Admission: RE | Admit: 2024-07-15 | Discharge: 2024-07-15 | Disposition: A | Discharge status: Home / Self Care | Source: Ambulatory Visit | Attending: Cardiovascular Disease | Admitting: Cardiovascular Disease

## 2024-07-15 DIAGNOSIS — I480 Paroxysmal atrial fibrillation: Secondary | ICD-10-CM | POA: Insufficient documentation

## 2024-07-15 DIAGNOSIS — Z48812 Encounter for surgical aftercare following surgery on the circulatory system: Secondary | ICD-10-CM | POA: Insufficient documentation

## 2024-07-15 DIAGNOSIS — C12 Malignant neoplasm of pyriform sinus: Secondary | ICD-10-CM | POA: Diagnosis present

## 2024-07-15 DIAGNOSIS — Z7901 Long term (current) use of anticoagulants: Secondary | ICD-10-CM | POA: Diagnosis not present

## 2024-07-15 DIAGNOSIS — Z8673 Personal history of transient ischemic attack (TIA), and cerebral infarction without residual deficits: Secondary | ICD-10-CM | POA: Diagnosis not present

## 2024-07-15 DIAGNOSIS — I10 Essential (primary) hypertension: Secondary | ICD-10-CM | POA: Diagnosis not present

## 2024-07-15 DIAGNOSIS — G893 Neoplasm related pain (acute) (chronic): Secondary | ICD-10-CM | POA: Diagnosis not present

## 2024-07-15 DIAGNOSIS — Z87891 Personal history of nicotine dependence: Secondary | ICD-10-CM | POA: Diagnosis not present

## 2024-07-15 DIAGNOSIS — I2510 Atherosclerotic heart disease of native coronary artery without angina pectoris: Secondary | ICD-10-CM | POA: Diagnosis not present

## 2024-07-15 DIAGNOSIS — Z951 Presence of aortocoronary bypass graft: Secondary | ICD-10-CM | POA: Insufficient documentation

## 2024-07-15 NOTE — Progress Notes (Signed)
 Daily Session Note  Patient Details  Name: Dave Williams MRN: [id number] Date of Birth: 1956/06/11 Referring Provider:   Flowsheet Row CARDIAC REHAB PHASE II ORIENTATION from 05/20/2024 in [facility] CARDIAC REHABILITATION  Referring Provider Court Carrier MD    Encounter Date: 07/15/2024  Check In:  Session Check In - 07/15/24 0915       Check-In   Location [facility]    Staff Present Powell Benders, BS, Exercise Physiologist;Jessica Vonzell, MA, RCEP, CCRP, CCET;Brittany Jackquline, BSN, RN, WTA-C    Virtual Visit No    Medication changes reported     No    Fall or balance concerns reported    No    Tobacco Cessation No Change    Warm-up and Cool-down Performed on first and last piece of equipment    Resistance Training Performed Yes    VAD Patient? No    PAD/SET Patient? No      Pain Assessment   Currently in Pain? No/denies    Pain Score 0-No pain    Multiple Pain Sites No          Capillary Blood Glucose: No results found for this or any previous visit (from the past 24 hours).    Social History   Tobacco Use  Smoking Status Former   Current packs/day: 0.50   Types: Cigarettes  Smokeless Tobacco Never    Goals Met:  Independence with exercise equipment Exercise tolerated well No report of concerns or symptoms today Strength training completed today  Goals Unmet:  Not Applicable  Comments: Pt able to follow exercise prescription today without complaint.  Will continue to monitor for progression.

## 2024-07-16 ENCOUNTER — Encounter: Payer: Self-pay | Admitting: Oncology

## 2024-07-17 ENCOUNTER — Encounter (HOSPITAL_COMMUNITY)
Admission: RE | Admit: 2024-07-17 | Discharge: 2024-07-17 | Disposition: A | Discharge status: Home / Self Care | Source: Ambulatory Visit | Attending: Cardiovascular Disease | Admitting: Cardiovascular Disease

## 2024-07-17 DIAGNOSIS — Z48812 Encounter for surgical aftercare following surgery on the circulatory system: Secondary | ICD-10-CM | POA: Diagnosis not present

## 2024-07-17 DIAGNOSIS — Z951 Presence of aortocoronary bypass graft: Secondary | ICD-10-CM

## 2024-07-17 NOTE — Progress Notes (Signed)
 Daily Session Note  Patient Details  Name: Dave Williams MRN: [id number] Date of Birth: [birth year] Referring Provider:   Flowsheet Row CARDIAC REHAB PHASE II ORIENTATION from 05/20/2024 in [facility] CARDIAC REHABILITATION  Referring Provider Court Carrier MD    Encounter Date: 07/17/2024  Check In:  Session Check In - 07/17/24 9061       Check-In   Supervising physician immediately available to respond to emergencies See telemetry face sheet for immediately available MD    Location [facility]    Staff Present Powell Benders, BS, Exercise Physiologist;Sally Reimers Vonzell, MA, RCEP, CCRP, CCET;Brittany Jackquline, BSN, RN, WTA-C    Virtual Visit No    Medication changes reported     No    Fall or balance concerns reported    No    Warm-up and Cool-down Performed on first and last piece of equipment    Resistance Training Performed Yes    VAD Patient? No    PAD/SET Patient? No      Pain Assessment   Currently in Pain? No/denies          Capillary Blood Glucose: No results found for this or any previous visit (from the past 24 hours).    Social History   Tobacco Use  Smoking Status Former   Current packs/day: 0.50   Types: Cigarettes  Smokeless Tobacco Never    Goals Met:  Independence with exercise equipment Exercise tolerated well No report of concerns or symptoms today Strength training completed today  Goals Unmet:  Not Applicable  Comments: Pt able to follow exercise prescription today without complaint.  Will continue to monitor for progression.

## 2024-07-19 ENCOUNTER — Encounter (HOSPITAL_COMMUNITY)
Admission: RE | Admit: 2024-07-19 | Discharge: 2024-07-19 | Disposition: A | Discharge status: Home / Self Care | Source: Ambulatory Visit | Attending: Cardiovascular Disease | Admitting: Cardiovascular Disease

## 2024-07-19 DIAGNOSIS — Z48812 Encounter for surgical aftercare following surgery on the circulatory system: Secondary | ICD-10-CM | POA: Diagnosis not present

## 2024-07-19 DIAGNOSIS — Z951 Presence of aortocoronary bypass graft: Secondary | ICD-10-CM

## 2024-07-19 NOTE — Progress Notes (Signed)
 Daily Session Note  Patient Details  Name: Dave Williams MRN: [id number] Date of Birth: Dec 08, 1956 Referring Provider:   Flowsheet Row CARDIAC REHAB PHASE II ORIENTATION from 05/20/2024 in [facility] CARDIAC REHABILITATION  Referring Provider Court Carrier MD    Encounter Date: 07/19/2024  Check In:  Session Check In - 07/19/24 0909       Check-In   Supervising physician immediately available to respond to emergencies See telemetry face sheet for immediately available MD    Location [facility]    Staff Present Laymon Rattler, BSN, RN, Rosalba Gelineau, MA, RCEP, CCRP, CCET    Virtual Visit No    Medication changes reported     No    Fall or balance concerns reported    No    Tobacco Cessation No Change    Warm-up and Cool-down Performed on first and last piece of equipment    Resistance Training Performed Yes    VAD Patient? No    PAD/SET Patient? No      Pain Assessment   Currently in Pain? No/denies          Capillary Blood Glucose: No results found for this or any previous visit (from the past 24 hours).    Social History   Tobacco Use  Smoking Status Former   Current packs/day: 0.50   Types: Cigarettes  Smokeless Tobacco Never    Goals Met:  Independence with exercise equipment Exercise tolerated well No report of concerns or symptoms today Strength training completed today  Goals Unmet:  Not Applicable  Comments: Pt able to follow exercise prescription today without complaint.  Will continue to monitor for progression.

## 2024-07-22 ENCOUNTER — Encounter (HOSPITAL_COMMUNITY)

## 2024-07-22 ENCOUNTER — Encounter: Payer: Self-pay | Admitting: Cardiovascular Disease

## 2024-07-22 ENCOUNTER — Encounter (HOSPITAL_COMMUNITY): Payer: Self-pay

## 2024-07-22 ENCOUNTER — Ambulatory Visit: Attending: Cardiovascular Disease | Admitting: Cardiovascular Disease

## 2024-07-22 ENCOUNTER — Other Ambulatory Visit: Payer: Self-pay

## 2024-07-22 ENCOUNTER — Telehealth (INDEPENDENT_AMBULATORY_CARE_PROVIDER_SITE_OTHER): Payer: Self-pay | Admitting: Otolaryngology

## 2024-07-22 VITALS — BP 124/72 | HR 75 | Ht 73.0 in | Wt 240.4 lb

## 2024-07-22 DIAGNOSIS — Z951 Presence of aortocoronary bypass graft: Secondary | ICD-10-CM

## 2024-07-22 DIAGNOSIS — I480 Paroxysmal atrial fibrillation: Secondary | ICD-10-CM

## 2024-07-22 DIAGNOSIS — E7800 Pure hypercholesterolemia, unspecified: Secondary | ICD-10-CM | POA: Diagnosis not present

## 2024-07-22 DIAGNOSIS — Z8679 Personal history of other diseases of the circulatory system: Secondary | ICD-10-CM

## 2024-07-22 MED ORDER — DIAZEPAM 2 MG PO TABS: 1.0000 mg | ORAL_TABLET | Freq: Four times a day (QID) | ORAL | 0 refills | Status: DC | PRN

## 2024-07-22 NOTE — Assessment & Plan Note (Signed)
 History of essential hypertension with blood pressure measured today at 124/72.  He is on amlodipine .

## 2024-07-22 NOTE — Assessment & Plan Note (Signed)
 History of hyperlipidemia on high-dose statin therapy with lipid profile performed 03/06/2024 revealing total cholesterol 94, LDL 50 and HDL 22.

## 2024-07-22 NOTE — Patient Instructions (Signed)
 Medication Instructions:  Your physician has recommended you make the following change in your medication:   -Stop amiodarone  (pacerone ).  -Stop apixaban  (eliquis ).   *If you need a refill on your cardiac medications before your next appointment, please call your pharmacy*    Follow-Up: At [facility], you and your health needs are our priority.  As part of our continuing mission to provide you with exceptional heart care, our providers are all part of one team.  This team includes your primary Cardiologist (physician) and Advanced Practice Providers or APPs (Physician Assistants and Nurse Practitioners) who all work together to provide you with the care you need, when you need it.  Your next appointment:   6 month(s)  Provider:   Lum Louis, NP         Then, Dorn Lesches, MD will plan to see you again in 12 month(s).     We recommend signing up for the patient portal called MyChart.  Sign up information is provided on this After Visit Summary.  MyChart is used to connect with patients for Virtual Visits (Telemedicine).  Patients are able to view lab/test results, encounter notes, upcoming appointments, etc.  Non-urgent messages can be sent to your provider as well.   To learn more about what you can do with MyChart, go to [email or web address].

## 2024-07-22 NOTE — Telephone Encounter (Signed)
 Still having anxiety intermittently, so will give him Valium  1mg  q6h PRN; he is cleared from cardiology standpoint and ready to proceed Kadence Mikkelson B Azalya Galyon

## 2024-07-22 NOTE — Assessment & Plan Note (Signed)
 History of PAF at the time of surgery maintaining sinus rhythm.  He was on low-dose amiodarone  and Eliquis  which I am going to discontinue.

## 2024-07-22 NOTE — Progress Notes (Signed)
 PCP - Myra Geni ORN, FNP  Cardiologist - Court Dorn PARAS, MD (per patient Appointment on 07-22-24)   PPM/ICD - denies Device Orders - n/a Rep Notified - n/a  Chest x-ray - 05-14-24 EKG - 07-03-24 Stress Test -  ECHO - 11-30-23 Cardiac Cath - 11-28-23  CPAP - denies  DM -denies  Blood Thinner Instructions: apixaban  (ELIQUIS ) LAST DOSE 07-21-24 Aspirin  Instructions: instructed patient to follow up with MD  ERAS Protcol - clear liquids until 12:30 PM.  COVID TEST- n/a  Anesthesia review: Yes, Difficult airway, procedure recently canceled due to chest, HX HTN, stroke, Cad. Per patient denies chest pain at this time. Reports being anxious about procedure.  Patient verbally denies any shortness of breath, fever, cough and chest pain during phone call   -------------  SDW INSTRUCTIONS given:  Your procedure is scheduled on July 24, 2024.  Report to [facility] at 1:00 P.M., and check in at the Admitting office.  Call this number if you have problems the morning of surgery:  [phone]   Remember:  Do not eat after midnight the night before your surgery  You may drink clear liquids until 12:30 the day of your surgery.    Clear liquids allowed are: Water, Non-Citrus Juices (without pulp), Carbonated Beverages, Clear Tea, Black Coffee Only, and Gatorade    Take these medicines the morning of surgery with A SIP OF WATER  acetaminophen  (TYLENOL )  amiodarone  (PACERONE )  atorvastatin  (LIPITOR )  HYDROcodone -acetaminophen  (NORCO/VICODIN)  hydrOXYzine  (VISTARIL )  isosorbide  mononitrate (IMDUR )  nitroGLYCERIN  (NITROSTAT )  pantoprazole  (PROTONIX )    As of today, STOP taking any Aspirin  (unless otherwise instructed by your surgeon) Aleve , Naproxen , Ibuprofen, Motrin, Advil, Goody's, BC's, all herbal medications, fish oil, and all vitamins.                      Do not wear jewelry, make up, or nail polish            Do not wear lotions, powders,  perfumes/colognes, or deodorant.            Do not shave 48 hours prior to surgery.  Men may shave face and neck.            Do not bring valuables to the hospital.            [facility] is not responsible for any belongings or valuables.  Do NOT Smoke (Tobacco/Vaping) 24 hours prior to your procedure If you use a CPAP at night, you may bring all equipment for your overnight stay.   Contacts, glasses, dentures or bridgework may not be worn into surgery.      For patients admitted to the hospital, discharge time will be determined by your treatment team.   Patients discharged the day of surgery will not be allowed to drive home, and someone needs to stay with them for 24 hours.    Special instructions:   [place]- Preparing For Surgery  Before surgery, you can play an important role. Because skin is not sterile, your skin needs to be as free of germs as possible. You can reduce the number of germs on your skin by washing with CHG (chlorahexidine gluconate) Soap before surgery.  CHG is an antiseptic cleaner which kills germs and bonds with the skin to continue killing germs even after washing.    Oral Hygiene is also important to reduce your risk of infection.  Remember - BRUSH YOUR TEETH THE MORNING OF  SURGERY WITH YOUR REGULAR TOOTHPASTE  Please do not use if you have an allergy to CHG or antibacterial soaps. If your skin becomes reddened/irritated stop using the CHG.  Do not shave (including legs and underarms) for at least 48 hours prior to first CHG shower. It is OK to shave your face.  Please follow these instructions carefully.   Shower the NIGHT BEFORE SURGERY and the MORNING OF SURGERY with DIAL Soap.   Pat yourself dry with a CLEAN TOWEL.  Wear CLEAN PAJAMAS to bed the night before surgery  Place CLEAN SHEETS on your bed the night of your first shower and DO NOT SLEEP WITH PETS.   Day of Surgery: Please shower morning of surgery  Wear Clean/Comfortable clothing the  morning of surgery Do not apply any deodorants/lotions.   Remember to brush your teeth WITH YOUR REGULAR TOOTHPASTE.   Questions were answered. Patient verbalized understanding of instructions.

## 2024-07-22 NOTE — Assessment & Plan Note (Signed)
 History of CAD status post By Dr. Swaziland 11/28/2023 revealing three-vessel disease.  He ultimately underwent AVG by Dr. Shyrl 02/29/2024 with a LIMA to his LAD, vein to an OM branch and the PDA.  He has been asymptomatic since and fairly active.

## 2024-07-22 NOTE — Progress Notes (Signed)
 07/22/2024 Dave Williams   1956/03/20  [id number]  Primary Physician Myra Geni ORN, FNP Primary Cardiologist: Dorn JINNY Lesches MD [place] [place], [place]  HPI:  Dave Williams is a 67 y.o.   thin appearing divorced Caucasian male with no children who is been disabled because of back issues and was referred for chest pain by the emergency room where he was seen 11/04/2023. 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  I last saw him in the office 04/22/2024.  His risk factors include over 50 pack years tobacco abuse currently smoking 1/2 pack/day, treated hypertension and hyperlipidemia. His father had CAD and had stents notably CABG. He has never had a heart attack but apparently had a TIA 3 years ago. He has had onset of chest pain approxi-6 weeks ago that occurs several times a day and radiates to his jaw, shoulder and left upper extremity. He was given sublingual nitroglycerin  in the ambulance which relieved his chest pain. He has a first responder and EMT as well.   I referred him for outpatient cath which was performed by Dr. Swaziland on 11/28/2023 revealing three-vessel disease.  CABG was recommended.  This was performed by Dr. Shyrl 02/29/2024 with a LIMA to his LAD, vein to the PDA and OM branches.  His postop course was uncomplicated except for some PAF.  He was complaining of some nocturnal angina which improved with the addition of amlodipine  and Imdur .  He had normal LV function.  Of note, he did stop smoking at the time of his bypass surgery.  Since I saw him 3 months ago his remained stable.  He has been participating cardiac rehab Monday/Wednesday/Friday [facility]  He is completely asymptomatic.  Apparently he needs a throat biopsy in the near future because of a mass.  His last EKG performed 07/03/2024 was sinus rhythm.  Based on this I decided to discontinue his amiodarone  and his Eliquis .   Current Meds  Medication Sig    acetaminophen  (TYLENOL ) 500 MG tablet Take 1,000 mg by mouth every 6 (six) hours as needed (pain.).   amLODipine  (NORVASC ) 10 MG tablet Take 1 tablet (10 mg total) by mouth daily.   aspirin  EC 81 MG tablet Take 1 tablet (81 mg total) by mouth daily. Swallow whole.   atorvastatin  (LIPITOR ) 80 MG tablet Take 1 tablet (80 mg total) by mouth daily with supper.   HYDROcodone -acetaminophen  (NORCO/VICODIN) 5-325 MG tablet Take 1 tablet by mouth every 6 (six) hours as needed for moderate pain (pain score 4-6).   hydrOXYzine  (VISTARIL ) 25 MG capsule Take 1 capsule (25 mg total) by mouth every 8 (eight) hours as needed.   isosorbide  mononitrate (IMDUR ) 30 MG 24 hr tablet Take 1 tablet (30 mg total) by mouth daily.   pantoprazole  (PROTONIX ) 40 MG tablet Take 1 tablet (40 mg total) by mouth daily.   vitamin D3 (CHOLECALCIFEROL ) 25 MCG tablet Take 1 tablet (1,000 Units total) by mouth daily.   [DISCONTINUED] amiodarone  (PACERONE ) 200 MG tablet Take 100 mg by mouth in the morning.     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 and has not picked up since   Sexual activity: Yes  Other Topics Concern   Not on file  Social History Narrative   Not on file   Social Drivers of Health   Financial Resource Strain: Not on file  Food Insecurity: No Food Insecurity (03/05/2024)   Hunger Vital Sign    Worried About Running Out of Food  in the Last Year: Never true    Ran Out of Food in the Last Year: Never true  Transportation Needs: No Transportation Needs (03/05/2024)   PRAPARE - Administrator, Civil Service (Medical): No    Lack of Transportation (Non-Medical): No  Physical Activity: Not on file  Stress: Not on file  Social Connections: Moderately Integrated (03/05/2024)   Social Connection and Isolation Panel    Frequency of Communication with Friends and Family: More than three times a week    Frequency of Social Gatherings with Friends and Family: More than three times a week    Attends Religious Services: More than 4 times per year    Active Member of [employer] or Organizations: Yes    Attends Banker Meetings: 1 to 4 times per year    Marital Status: Divorced  Catering manager Violence: Not At Risk (03/05/2024)   Humiliation, Afraid, Rape, and Kick questionnaire    Fear of Current or Ex-Partner: No    Emotionally Abused: No    Physically Abused: No    Sexually Abused: No     Review of Systems: General: negative for chills, fever, night sweats or weight changes.  Cardiovascular: negative for chest pain, dyspnea on exertion, edema, orthopnea, palpitations, paroxysmal nocturnal dyspnea or shortness of breath Dermatological: negative for rash Respiratory: negative for cough or wheezing Urologic: negative for hematuria Abdominal: negative for nausea, vomiting, diarrhea, bright red blood per rectum, melena, or hematemesis Neurologic: negative for visual changes, syncope, or dizziness All other systems reviewed and are otherwise negative except as noted above.    Blood pressure 124/72, pulse 75, height 6' 1 (1.854 m), weight 240 lb 6.4 oz (109 kg), SpO2 97%.  General appearance: alert and no distress Neck: no adenopathy, no carotid bruit, no JVD, supple, symmetrical, trachea midline, and thyroid  not enlarged, symmetric, no tenderness/mass/nodules Lungs: clear to auscultation  bilaterally Heart: regular rate and rhythm, S1, S2 normal, no murmur, click, rub or gallop Extremities: extremities normal, atraumatic, no cyanosis or edema Pulses: 2+ and symmetric Skin: Skin color, texture, turgor normal. No rashes or lesions Neurologic: Grossly normal  EKG not performed today      ASSESSMENT AND PLAN:   History of hypertension History of essential hypertension with blood pressure measured today at 124/72.  He is on amlodipine .  Hypercholesterolemia History of hyperlipidemia on high-dose statin therapy with lipid profile performed 03/06/2024 revealing total cholesterol 94, LDL 50 and HDL 22.  S/P CABG x 3 History of CAD status post By Dr. Swaziland 11/28/2023 revealing three-vessel disease.  He ultimately underwent AVG by Dr. Shyrl 02/29/2024 with a LIMA to his LAD, vein to an OM branch and the PDA.  He has been  asymptomatic since and fairly active.  PAF (paroxysmal atrial fibrillation) (HCC) History of PAF at the time of surgery maintaining sinus rhythm.  He was on low-dose amiodarone  and Eliquis  which I am going to discontinue.     Dorn DOROTHA Lesches MD FACP,FACC,FAHA, [facility] 07/22/2024 11:54 AM

## 2024-07-23 NOTE — Progress Notes (Signed)
 Anesthesia Chart Review: Same day workup  68 year old male follows with cardiology for history of former smoker (quit 02/2024), HTN, HLD, TIA, paroxysmal A-fib, CAD s/p CABG x 3 with a LIMA to LAD, vein to the PDA and OM branches.  Postop course was uncomplicated except for some PAF.  He had normal LV function.  He reported some nocturnal angina which improved with the addition of amlodipine  and Imdur .  Seen by Dr. Court on 04/22/2024 and noted to be maintaining sinus rhythm on Eliquis  and amiodarone .  Amiodarone  was decreased from 200 to 100 mg/day.  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   Patient recently seen by oncology for lymphadenopathy.  Ultrasound of the neck on 06/05/2024 showed enlarged lymph nodes in the right superior cervical chain measuring 5.1 x 2.1 x 3.0 cm and 2.5 x 1.4 x 1.6 cm.  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  He was urgently referred to ENT for biopsy of the supraglottic mass.  He was subsequently seen by Dr. Tobie on 06/26/2024.  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 Needs expedited biopsy. Messaged Dr. Court for Eliquis  - ideally hold 2 days prior, resume day after. If unable to hold, will proceed without. Will schedule DL/Bx.  Patient presented for surgery on 07/02/2024 but at that time reported exertional chest pain that occurred on 07/01/2024 and was responsive to nitroglycerin .  Given this, he was directed to follow-up outpatient with cardiology before proceeding with surgery.  Per subsequent telephone encounter by cardiologist Dr. Ladona 07/04/2024, I received a call from anesthesiologist Dr. Delon Dawn as patient was being evaluated in the preop, mention  chest discomfort with radiation to the neck that occurred 1 day prior to the preop evaluation while he was doing physical activity, also stated that he was having some discomfort similarly and was just sitting when she was examining him.  Patient is scheduled for direct laryngoscopy and biopsy with ENT under general anesthesia. I called the patient today and discussed over the telephone regarding his symptoms.  Patient states that he has been extremely nervous about the procedure and does not seem to think that this is related to his cardiac issue and symptoms were different. 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 He is also scheduled for cardiac rehab visit tomorrow. I advised him that we can proceed with surgery with low risk and no office visit is required unless he starts having chest discomfort.  Surgery is scheduled in 3 weeks, he can be taken up for the surgery with low risk as he has resumed all his activities and has not had any chest discomfort. Patient is also aware to call us  if he were to develop recurrence of chest pain or he needs nitroglycerin .  Reassuring part is that he is in cardiac rehab and actively participating in cardiac rehab and if he were to develop anginal symptoms I am sure we will be notified. Patient also states that he has been going to the gym 3 days a week and has not had any chest discomfort or chest pain. Otherwise patient will keep his  usual appointment in August with cardiology.   He was also seen in follow-up by Dr. Court on 07/22/2024.  Per note, Since I saw him 3 months ago his remained stable. He has been participating cardiac rehab Monday/Wednesday/Friday [facility] He is completely asymptomatic. Apparently he needs a throat biopsy in the near future because of a mass. His last EKG performed 07/03/2024 was sinus  rhythm. Based on this I decided to discontinue his amiodarone  and his Eliquis .   Patient reports last dose of Eliquis  07/21/24.   History of GERD, maintained on PPI.   Difficult airway noted 03/04/2024 due to anterior larynx and limited oral opening.  Glide scope used electively.   CMP and CBC 06/19/2024 reviewed, creatinine mildly elevated at 1.34, otherwise unremarkable.   Patient will need day of surgery evaluation.   EKG 07/03/2024: Normal sinus rhythm.  Rate 66. Incomplete right bundle branch block. Septal infarct , age undetermined. T wave abnormality, consider anterolateral ischemia.  No significant change.   Intraoperative TEE 03/04/2024: Complications: No known complications during this procedure.  POST-OP IMPRESSIONS  _ Left Ventricle: The left ventricle is unchanged from pre-bypass.  _ Right Ventricle: The right ventricle appears unchanged from pre-bypass.  _ Aorta: The aorta appears unchanged from pre-bypass.  _ Left Atrium: The left atrium appears unchanged from pre-bypass.  _ Left Atrial Appendage: The left atrial appendage appears unchanged from  pre-bypass.  _ Aortic Valve: The aortic valve appears unchanged from pre-bypass.  _ Mitral Valve: The mitral valve appears unchanged from pre-bypass.  _ Tricuspid Valve: The tricuspid valve appears unchanged from pre-bypass.  _ Pulmonic Valve: The pulmonic valve appears unchanged from pre-bypass.  _ Interatrial Septum: The interatrial septum appears unchanged from  pre-bypass.  _ Interventricular Septum: The interventricular septum appears unchanged  from  pre-bypass.  _ Pericardium: The pericardium appears unchanged from pre-bypass.      TTE 11/30/2023: 1. Left ventricular ejection fraction, by estimation, is 65 to 70%. The  left ventricle has normal function. The left ventricle has no regional  wall motion abnormalities. Left ventricular diastolic parameters are  consistent with Grade I diastolic  dysfunction (impaired  relaxation).   2. Right ventricular systolic function is normal. The right ventricular  size is normal.   3. The mitral valve is normal in structure. Trivial mitral valve  regurgitation. No evidence of mitral stenosis.   4. The aortic valve is tricuspid. There is mild calcification of the  aortic valve. Aortic valve regurgitation is not visualized. Aortic valve  sclerosis/calcification is present, without any evidence of aortic  stenosis.   5. The inferior vena cava is normal in size with greater than 50%  respiratory variability, suggesting right atrial pressure of 3 mmHg.     Lynwood Geofm RIGGERS [facility] Short Stay Center/Anesthesiology Phone [phone] 07/23/2024 9:43 AM

## 2024-07-23 NOTE — Anesthesia Preprocedure Evaluation (Addendum)
 Anesthesia Evaluation  Patient identified by MRN, date of birth, ID band Patient awake    Reviewed: Allergy & Precautions, NPO status , Patient's Chart, lab work & pertinent test results, reviewed documented beta blocker date and time   History of Anesthesia Complications Negative for: history of anesthetic complications  Airway Mallampati: III  TM Distance: >3 FB   Mouth opening: Limited Mouth Opening  Dental  (+) Missing, Poor Dentition,    Pulmonary neg shortness of breath, pneumonia, neg recent URI, former smoker   breath sounds clear to auscultation       Cardiovascular hypertension, + angina with exertion + CAD, + Past MI, + CABG and + DOE  (-) pacemaker(-) Cardiac Defibrillator (-) Valvular Problems/Murmurs Rhythm:Regular Rate:Normal     Neuro/Psych neg Seizures PSYCHIATRIC DISORDERS Anxiety     CVA, No Residual Symptoms    GI/Hepatic ,neg GERD  ,,(+) neg Cirrhosis        Endo/Other    Renal/GU Renal disease     Musculoskeletal   Abdominal   Peds  Hematology  (+) Blood dyscrasia, anemia   Anesthesia Other Findings   Reproductive/Obstetrics                              Anesthesia Physical Anesthesia Plan  ASA: 3  Anesthesia Plan: General   Post-op Pain Management:    Induction: Intravenous  PONV Risk Score and Plan: 2 and Ondansetron  and Dexamethasone   Airway Management Planned: Oral ETT and Video Laryngoscope Planned  Additional Equipment:   Intra-op Plan:   Post-operative Plan: Extubation in OR  Informed Consent: I have reviewed the patients History and Physical, chart, labs and discussed the procedure including the risks, benefits and alternatives for the proposed anesthesia with the patient or authorized representative who has indicated his/her understanding and acceptance.     Dental advisory given  Plan Discussed with: CRNA  Anesthesia Plan Comments: (PAT  note by Lynwood Hope, PA-C: 68 year old male follows with cardiology for history of former smoker (quit 02/2024), HTN, HLD, TIA, paroxysmal A-fib, CAD s/p CABG x 3 with a LIMA to LAD, vein to the PDA and OM branches.  Postop course was uncomplicated except for some PAF.  He had normal LV function.  He reported some nocturnal angina which improved with the addition of amlodipine  and Imdur .  Seen by Dr. Court on 04/22/2024 and noted to be maintaining sinus rhythm on Eliquis  and amiodarone .  Amiodarone  was decreased from 200 to 100 mg/day.  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   Patient recently seen by oncology for lymphadenopathy.  Ultrasound of the neck on 06/05/2024 showed enlarged lymph nodes in the right superior cervical chain measuring 5.1 x 2.1 x 3.0 cm and 2.5 x 1.4 x 1.6 cm.  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  He was urgently referred to ENT for biopsy of the supraglottic mass.  He was subsequently seen by Dr. Tobie on 06/26/2024.  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 Needs expedited biopsy. Messaged Dr. Court for Eliquis  - ideally hold 2 days prior, resume day after. If unable to hold, will proceed without. Will schedule DL/Bx.  Patient presented for surgery on 07/02/2024  but at that time reported exertional chest pain that occurred on 07/01/2024 and was responsive to nitroglycerin .  Given this, he was directed to follow-up outpatient with cardiology before proceeding with surgery.  Per subsequent telephone encounter by cardiologist Dr. Ladona 07/04/2024, I received a call from anesthesiologist Dr. Delon Dawn as patient was being evaluated in the preop, mention chest discomfort  with radiation to the neck that occurred 1 day prior to the preop evaluation while he was doing physical activity, also stated that he was having some discomfort similarly and was just sitting when she was examining him.  Patient is scheduled for direct laryngoscopy and biopsy with ENT under general anesthesia. I called the patient today and discussed over the telephone regarding his symptoms.  Patient states that he has been extremely nervous about the procedure and does not seem to think that this is related to his cardiac issue and symptoms were different. 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 He is also scheduled for cardiac rehab visit tomorrow. I advised him that we can proceed with surgery with low risk and no office visit is required unless he starts having chest discomfort.  Surgery is scheduled in 3 weeks, he can be taken up for the surgery with low risk as he has resumed all his activities and has not had any chest discomfort. Patient is also aware to call us  if he were to develop recurrence of chest pain or he needs nitroglycerin .  Reassuring part is that he is in cardiac rehab and actively participating in cardiac rehab and if he were to develop anginal symptoms I am sure we will be notified. Patient also states that he has been going to the gym 3 days a week and has not had any chest discomfort or chest pain. Otherwise patient will keep his usual appointment in August with cardiology.   He was also seen in follow-up by Dr. Court on 07/22/2024.  Per note, Since I saw him 3 months ago his remained stable. He has been participating cardiac rehab Monday/Wednesday/Friday [facility]. He is completely asymptomatic. Apparently he needs a throat biopsy in the near future because of a mass. His last EKG performed 07/03/2024 was sinus rhythm. Based on  this I decided to discontinue his amiodarone  and his Eliquis .   Patient reports last dose of Eliquis  07/21/24.   History of GERD, maintained on PPI.   Difficult airway noted 03/04/2024 due to anterior larynx and limited oral opening.  Glide scope used electively.   CMP and CBC 06/19/2024 reviewed, creatinine mildly elevated at 1.34, otherwise unremarkable.   Patient will need day of surgery evaluation.   EKG 07/03/2024: Normal sinus rhythm.  Rate 66. Incomplete right bundle branch block. Septal infarct , age undetermined. T wave abnormality, consider anterolateral ischemia.  No significant change.   Intraoperative TEE 03/04/2024: Complications: No known complications during this procedure.  POST-OP IMPRESSIONS  _ Left Ventricle: The left ventricle is unchanged from pre-bypass.  _ Right Ventricle: The right ventricle appears unchanged from pre-bypass.  _ Aorta: The aorta appears unchanged from pre-bypass.  _ Left Atrium: The left atrium appears unchanged from pre-bypass.  _ Left Atrial Appendage: The left atrial appendage appears unchanged from  pre-bypass.  _ Aortic Valve: The aortic valve appears unchanged from pre-bypass.  _ Mitral Valve: The mitral valve  appears unchanged from pre-bypass.  _ Tricuspid Valve: The tricuspid valve appears unchanged from pre-bypass.  _ Pulmonic Valve: The pulmonic valve appears unchanged from pre-bypass.  _ Interatrial Septum: The interatrial septum appears unchanged from  pre-bypass.  _ Interventricular Septum: The interventricular septum appears unchanged  from  pre-bypass.  _ Pericardium: The pericardium appears unchanged from pre-bypass.      TTE 11/30/2023: 1. Left ventricular ejection fraction, by estimation, is 65 to 70%. The  left ventricle has normal function. The left ventricle has no regional  wall motion abnormalities. Left ventricular diastolic parameters are  consistent with Grade I diastolic  dysfunction (impaired relaxation).   2.  Right ventricular systolic function is normal. The right ventricular  size is normal.   3. The mitral valve is normal in structure. Trivial mitral valve  regurgitation. No evidence of mitral stenosis.   4. The aortic valve is tricuspid. There is mild calcification of the  aortic valve. Aortic valve regurgitation is not visualized. Aortic valve  sclerosis/calcification is present, without any evidence of aortic  stenosis.   5. The inferior vena cava is normal in size with greater than 50%  respiratory variability, suggesting right atrial pressure of 3 mmHg.    )         Anesthesia Quick Evaluation

## 2024-07-24 ENCOUNTER — Encounter (HOSPITAL_COMMUNITY)

## 2024-07-24 ENCOUNTER — Ambulatory Visit (HOSPITAL_COMMUNITY): Admitting: Physician Assistant

## 2024-07-24 ENCOUNTER — Encounter (HOSPITAL_COMMUNITY): Payer: Self-pay | Admitting: *Deleted

## 2024-07-24 ENCOUNTER — Ambulatory Visit (HOSPITAL_COMMUNITY)
Admission: RE | Admit: 2024-07-24 | Discharge: 2024-07-24 | Disposition: A | Discharge status: Home / Self Care | Attending: Otolaryngology | Admitting: Otolaryngology

## 2024-07-24 ENCOUNTER — Encounter (HOSPITAL_COMMUNITY)
Admission: RE | Disposition: A | Discharge status: Home / Self Care | Payer: Self-pay | Source: Home / Self Care | Attending: Otolaryngology

## 2024-07-24 DIAGNOSIS — J392 Other diseases of pharynx: Secondary | ICD-10-CM

## 2024-07-24 DIAGNOSIS — C130 Malignant neoplasm of postcricoid region: Secondary | ICD-10-CM | POA: Diagnosis not present

## 2024-07-24 DIAGNOSIS — Z87891 Personal history of nicotine dependence: Secondary | ICD-10-CM

## 2024-07-24 DIAGNOSIS — I2510 Atherosclerotic heart disease of native coronary artery without angina pectoris: Secondary | ICD-10-CM

## 2024-07-24 DIAGNOSIS — I252 Old myocardial infarction: Secondary | ICD-10-CM | POA: Insufficient documentation

## 2024-07-24 DIAGNOSIS — C12 Malignant neoplasm of pyriform sinus: Secondary | ICD-10-CM | POA: Diagnosis not present

## 2024-07-24 DIAGNOSIS — I10 Essential (primary) hypertension: Secondary | ICD-10-CM | POA: Diagnosis not present

## 2024-07-24 DIAGNOSIS — Z951 Presence of aortocoronary bypass graft: Secondary | ICD-10-CM

## 2024-07-24 HISTORY — PX: RIGID ESOPHAGOSCOPY: SHX5226

## 2024-07-24 HISTORY — PX: DIRECT LARYNGOSCOPY: SHX5326

## 2024-07-24 HISTORY — DX: Anxiety disorder, unspecified: F41.9

## 2024-07-24 LAB — BASIC METABOLIC PANEL WITH GFR
Anion gap: 11 (ref 5–15)
BUN: 17 mg/dL (ref 8–23)
CO2: 21 mmol/L — ABNORMAL LOW (ref 22–32)
Calcium: 9.2 mg/dL (ref 8.9–10.3)
Chloride: 106 mmol/L (ref 98–111)
Creatinine, Ser: 1.28 mg/dL — ABNORMAL HIGH (ref 0.61–1.24)
GFR, Estimated: >60 mL/min (ref >60)
Glucose, Bld: 99 mg/dL (ref 70–99)
Potassium: 4.2 mmol/L (ref 3.5–5.1)
Sodium: 138 mmol/L (ref 135–145)

## 2024-07-24 LAB — CBC
HCT: 41.7 % (ref 39.0–52.0)
Hemoglobin: 13.3 g/dL (ref 13.0–17.0)
MCH: 28.9 pg (ref 26.0–34.0)
MCHC: 31.9 g/dL (ref 30.0–36.0)
MCV: 90.7 fL (ref 80.0–100.0)
Platelets: 258 K/uL (ref 150–400)
RBC: 4.6 MIL/uL (ref 4.22–5.81)
RDW: 15.9 % — ABNORMAL HIGH (ref 11.5–15.5)
WBC: 10.7 K/uL — ABNORMAL HIGH (ref 4.0–10.5)
nRBC: 0 % (ref 0.0–0.2)

## 2024-07-24 SURGERY — LARYNGOSCOPY, DIRECT
Anesthesia: General | Site: Throat

## 2024-07-24 MED ORDER — ROCURONIUM BROMIDE 10 MG/ML (PF) SYRINGE
PREFILLED_SYRINGE | INTRAVENOUS | Status: DC | PRN
  Administered 2024-07-24 (×2): 50 mg via INTRAVENOUS

## 2024-07-24 MED ORDER — FENTANYL CITRATE (PF) 100 MCG/2ML IJ SOLN
25.0000 ug | INTRAMUSCULAR | Status: DC | PRN
  Administered 2024-07-24 (×2): 50 ug via INTRAVENOUS

## 2024-07-24 MED ORDER — ONDANSETRON HCL 4 MG/2ML IJ SOLN
INTRAMUSCULAR | Status: DC | PRN
  Administered 2024-07-24 (×2): 4 mg via INTRAVENOUS

## 2024-07-24 MED ORDER — OXYCODONE HCL 5 MG PO TABS: 5.0000 mg | ORAL_TABLET | ORAL | 0 refills | Status: AC | PRN

## 2024-07-24 MED ORDER — PHENYLEPHRINE 80 MCG/ML (10ML) SYRINGE FOR IV PUSH (FOR BLOOD PRESSURE SUPPORT)
PREFILLED_SYRINGE | INTRAVENOUS | Status: DC | PRN
  Administered 2024-07-24: 80 ug via INTRAVENOUS
  Administered 2024-07-24: 160 ug via INTRAVENOUS
  Administered 2024-07-24: 80 ug via INTRAVENOUS
  Administered 2024-07-24: 160 ug via INTRAVENOUS

## 2024-07-24 MED ORDER — ORAL CARE MOUTH RINSE: 15.0000 mL | Freq: Once | OROMUCOSAL | Status: AC

## 2024-07-24 MED ORDER — DEXAMETHASONE SODIUM PHOSPHATE 10 MG/ML IJ SOLN
INTRAMUSCULAR | Status: AC
  Filled 2024-07-24: qty 1

## 2024-07-24 MED ORDER — LIDOCAINE 2% (20 MG/ML) 5 ML SYRINGE
INTRAMUSCULAR | Status: AC
Start: 2024-07-24 — End: 2024-07-24
  Filled 2024-07-24: qty 5

## 2024-07-24 MED ORDER — ACETAMINOPHEN 10 MG/ML IV SOLN: 1000.0000 mg | Freq: Once | INTRAVENOUS | Status: DC | PRN

## 2024-07-24 MED ORDER — PROPOFOL 10 MG/ML IV BOLUS
INTRAVENOUS | Status: AC
  Filled 2024-07-24: qty 20

## 2024-07-24 MED ORDER — FENTANYL CITRATE (PF) 100 MCG/2ML IJ SOLN
INTRAMUSCULAR | Status: AC
  Filled 2024-07-24: qty 2

## 2024-07-24 MED ORDER — OXYMETAZOLINE HCL 0.05 % NA SOLN
NASAL | Status: AC
  Filled 2024-07-24: qty 30

## 2024-07-24 MED ORDER — ONDANSETRON HCL 4 MG/2ML IJ SOLN: 4.0000 mg | Freq: Once | INTRAMUSCULAR | Status: DC | PRN

## 2024-07-24 MED ORDER — PROPOFOL 10 MG/ML IV BOLUS
INTRAVENOUS | Status: DC | PRN
  Administered 2024-07-24: 50 mg via INTRAVENOUS
  Administered 2024-07-24: 150 mg via INTRAVENOUS
  Administered 2024-07-24: 50 mg via INTRAVENOUS
  Administered 2024-07-24: 150 mg via INTRAVENOUS

## 2024-07-24 MED ORDER — LACTATED RINGERS IV SOLN: INTRAVENOUS | Status: DC

## 2024-07-24 MED ORDER — OXYMETAZOLINE HCL 0.05 % NA SOLN
NASAL | Status: DC | PRN
Start: 2024-07-24 — End: 2024-07-24
  Administered 2024-07-24 (×2): 1 via TOPICAL

## 2024-07-24 MED ORDER — OXYCODONE HCL 5 MG/5ML PO SOLN: 5.0000 mg | Freq: Once | ORAL | Status: DC | PRN

## 2024-07-24 MED ORDER — ASPIRIN 81 MG PO TBEC: 81.0000 mg | DELAYED_RELEASE_TABLET | Freq: Every day | ORAL | Status: AC

## 2024-07-24 MED ORDER — DEXAMETHASONE SODIUM PHOSPHATE 10 MG/ML IJ SOLN
INTRAMUSCULAR | Status: DC | PRN
  Administered 2024-07-24 (×2): 10 mg via INTRAVENOUS

## 2024-07-24 MED ORDER — FENTANYL CITRATE (PF) 250 MCG/5ML IJ SOLN
INTRAMUSCULAR | Status: DC | PRN
  Administered 2024-07-24 (×4): 50 ug via INTRAVENOUS

## 2024-07-24 MED ORDER — MIDAZOLAM HCL 2 MG/2ML IJ SOLN
INTRAMUSCULAR | Status: AC
  Filled 2024-07-24: qty 2

## 2024-07-24 MED ORDER — OXYCODONE HCL 5 MG PO TABS: 5.0000 mg | ORAL_TABLET | Freq: Once | ORAL | Status: DC | PRN

## 2024-07-24 MED ORDER — ONDANSETRON HCL 4 MG/2ML IJ SOLN
INTRAMUSCULAR | Status: AC
  Filled 2024-07-24: qty 2

## 2024-07-24 MED ORDER — LIDOCAINE 2% (20 MG/ML) 5 ML SYRINGE
INTRAMUSCULAR | Status: DC | PRN
  Administered 2024-07-24 (×2): 100 mg via INTRAVENOUS

## 2024-07-24 MED ORDER — CHLORHEXIDINE GLUCONATE 0.12 % MT SOLN
15.0000 mL | Freq: Once | OROMUCOSAL | Status: AC
  Administered 2024-07-24 (×2): 15 mL via OROMUCOSAL
  Filled 2024-07-24: qty 15

## 2024-07-24 MED ORDER — SUGAMMADEX SODIUM 200 MG/2ML IV SOLN
INTRAVENOUS | Status: DC | PRN
  Administered 2024-07-24 (×2): 200 mg via INTRAVENOUS

## 2024-07-24 MED ORDER — MIDAZOLAM HCL 2 MG/2ML IJ SOLN
INTRAMUSCULAR | Status: DC | PRN
  Administered 2024-07-24 (×2): 2 mg via INTRAVENOUS

## 2024-07-24 MED ORDER — ROCURONIUM BROMIDE 10 MG/ML (PF) SYRINGE
PREFILLED_SYRINGE | INTRAVENOUS | Status: AC
  Filled 2024-07-24: qty 10

## 2024-07-24 SURGICAL SUPPLY — 17 items
CANISTER SUCTION 3000ML PPV (SUCTIONS) ×1 IMPLANT
CNTNR URN SCR LID CUP LEK RST (MISCELLANEOUS) IMPLANT
COVER BACK TABLE 60X90IN (DRAPES) ×1 IMPLANT
COVER MAYO STAND STRL (DRAPES) ×1 IMPLANT
DRAPE HALF SHEET 40X57 (DRAPES) ×1 IMPLANT
DRSG TELFA 3X8 NADH STRL (GAUZE/BANDAGES/DRESSINGS) ×1 IMPLANT
GLOVE BIO SURGEON STRL SZ7.5 (GLOVE) ×1 IMPLANT
GOWN STRL REUS W/ TWL LRG LVL3 (GOWN DISPOSABLE) IMPLANT
GUARD TEETH (MISCELLANEOUS) ×1 IMPLANT
KIT TURNOVER KIT B (KITS) ×1 IMPLANT
NS IRRIG 1000ML POUR BTL (IV SOLUTION) ×1 IMPLANT
PAD ARMBOARD POSITIONER FOAM (MISCELLANEOUS) ×2 IMPLANT
PATTIES SURGICAL .5X1.5 (GAUZE/BANDAGES/DRESSINGS) ×1 IMPLANT
POSITIONER HEAD DONUT 9IN (MISCELLANEOUS) IMPLANT
SOL ANTI FOG 6CC (MISCELLANEOUS) IMPLANT
TOWEL GREEN STERILE FF (TOWEL DISPOSABLE) ×2 IMPLANT
TUBE CONNECTING 12X1/4 (SUCTIONS) ×1 IMPLANT

## 2024-07-24 NOTE — Op Note (Signed)
 Otolaryngology Operative note  Dave Williams Date/Time of Admission: 07/24/2024 12:58 PM  CSN: [id number];MRN:[id number]  DOB: 09/25/1956 Age: 68 y.o. Location: [facility] OR    Pre-Op Diagnosis: Right Hypopharynx Mass Right Neck Masss  Post-Op Diagnosis: Same  Procedure: Suspension Microdirect Laryngoscopy with Biopsy with use of Operating Telescope (CPT [phone]) Transoral Rigid Esophagoscopy (CPT [phone])  Surgeon: Eldora Blanch, MD  Anesthesia type:  General  Anesthesiologist: Anesthesiologist: Keneth Lynwood POUR, MD CRNA: Atanacio Arland HERO, CRNA   Staff: Circulator: Henry Lauraine PARAS, RN Scrub Person: Dyane No E  Implants: None  Specimens: ID Type Source Tests Collected by Time Destination  1 :  Tissue PATH ENT biopsy SURGICAL PATHOLOGY Blanch Eldora NOVAK, MD 07/24/2024 1617     EBL: <25cc  Drains: None  Post-op disposition and condition: PACU, hemodynamically stable  Findings: Right hypopharynx exophytic mass from right pyriform extending post-cricoid involving slight amount of the AE fold. No obvious gross cervical esophageal extension noted.  Complications: None apparent  Indications and consent:  Dave Williams is a 68 y.o. male with diagnoses above. The patient's options were discussed, including risks/benefits/alternatives for each option. Patient expressed understanding, and despite these risks, consented and decided to proceed with above procedures. Informed consent was signed before proceeding.  Procedure: The patient was identified in the preoperative area, consent confirmed, transported to the operating suite. He was transferred to the operating room table and placed in a supine position. After induction of general endotracheal anesthesia and establishment of airway, a surgeon initiated time out was performed.  The bed was rotated 90 degrees. The patient was then padded and draped appropriately.     The oral cavity was first examined and no abnormalities were  identified. We then proceeded with our direct laryngoscopy. A tooth guard was placed to protect the maxillary gingiva. The Dedo laryngoscope was inserted into the oral cavity and advanced distally. Examination of the oral cavity, oropharynx, hypopharynx and larynx was performed in a sequential manner at that time. Then, the Dedo Laryngoscope was removed and a rigid cervical esophagoscope was used to examine the cervical esophagus. It revealed findings above. The Esophagoscope was removed and then the laryngoscope was reintroduced and the right pyriform suspended.The operating telescope (0 degree) was then used to examine the areas described above. Cup forceps were used to obtain biopsies of the right pyriform mass and specimen passed off the field. Hemostasis was achieved with afrin pledgets. The hypopharynx and oropharynx were suctioned. All instruments were removed.  This concluded our procedure. The patient tolerated the procedure well without any apparent immediate post operative complications.  The patient was rotated back to their original position, gently awakened from general anesthesia and taken to the PACU in stable condition. I was present and participated through the entirety of the procedure.   Follow up: 1 week

## 2024-07-24 NOTE — H&P (Signed)
 Pre-Operative H&P - Day Of Surgery Patient Name: Dave Williams Date:   07/24/2024  HPI: Dave Williams is a 68 y.o. male who presents today for operative treatment of right hypopharynx mass. Patient denies recent significant changes to health or significant new medications or physiologic change in condition which would immediately impact plans. No new types of therapy has been initiated that would change the plan or the appropriateness of the plan.   ROS:  A complete review of systems was obtained and is otherwise negative.   PMH:  Past Medical History:  Diagnosis Date   Anxiety    Chronic left hip pain    Compression fracture of spine (HCC)    multiple compression Fx with disc bulge   Coronary artery disease    Hypertension    Pneumonia    As a teenager   Stroke [facility]) 2022   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    PSH:  Past Surgical History:  Procedure Laterality Date   BACK SURGERY     Radiocartography T2-T12   CORONARY ARTERY BYPASS GRAFT N/A 03/04/2024   Procedure: CORONARY ARTERY BYPASS GRAFTING (CABG) TIMES THREE UTILIZING LEFT INTERNAL MAMMARY ARTERY AND ENDOSCOPIC VEIN HARVEST RIGHT GREATER SAPHENOUS VEIN;  Surgeon: Shyrl Linnie KIDD, MD;  Location: [facility] OR;  Service: Open Heart Surgery;  Laterality: N/A;   KNEE ARTHROSCOPY Bilateral    LEFT HEART CATH AND CORONARY ANGIOGRAPHY N/A 11/28/2023   Procedure: LEFT HEART CATH AND CORONARY ANGIOGRAPHY;  Surgeon: [place], Peter M, MD;  Location: [facility] INVASIVE CV LAB;  Service: Cardiovascular;  Laterality: N/A;   PILONIDAL CYST EXCISION     In patients 20s   TEE WITHOUT CARDIOVERSION N/A 03/04/2024   Procedure: ECHOCARDIOGRAM, TRANSESOPHAGEAL;  Surgeon: Shyrl Linnie KIDD, MD;  Location: [facility] OR;  Service: Open Heart Surgery;  Laterality: N/A;    MEDS:   Current Facility-Administered Medications:    lactated ringers  infusion, , Intravenous, Continuous, Keneth Lynwood POUR, MD,  Last Rate: 10 mL/hr at 07/24/24 1326, New Bag at 07/24/24 1326  ALLERGIES: Patient has no known allergies.  EXAM: Vitals: BP (!) 151/89   Pulse 73   Temp 97.8 F (36.6 C) (Oral)   Resp 18   Ht 6' 1 (1.854 m)   Wt 109 kg   SpO2 98%   BMI 31.72 kg/m   General Awake, at baseline alertness.   HEENT No scleral icterus or conjunctival hemorrhage. Globe position appears normal. External ears  normal. Nose patent without rhinorrhea. No lymphadenopathy. No thyromegaly.  Cardiovascular No cyanosis.  Pulmonary No audible stridor. Breathing easily with no labor.  Neuro Symmetric facial movement.   Psychiatry Appropriate affect and mood.  Skin No scars or lesions on face or neck.  Extermities Moves all extremities with normal range of motion.   Other Findings None. Easily lays flat without respiratory distress   Assessment & Plan: Dave Williams has diagnoses of right hypopharynx mass and will go to the OR today for direct laryngoscopy with biopsy, esophagoscopy. Informed consent was obtained and available in EMR today. All questions have been answered, and risks/benefits/alternatives of procedure as noted in the consent were discussed in a quiet area. Questions were invited and answered. The patient expressed understanding, provided consent and wished to proceed despite risks.  Adonia Porada B Kristelle Cavallaro 07/24/2024 4:13 PM

## 2024-07-24 NOTE — Anesthesia Procedure Notes (Signed)
 Procedure Name: Intubation Date/Time: 07/24/2024 4:45 PM  Performed by: Atanacio Arland HERO, CRNAPre-anesthesia Checklist: Patient identified, Emergency Drugs available, Suction available and Patient being monitored Patient Re-evaluated:Patient Re-evaluated prior to induction Oxygen Delivery Method: Circle System Utilized Preoxygenation: Pre-oxygenation with 100% oxygen Induction Type: IV induction Ventilation: Mask ventilation without difficulty Laryngoscope Size: Glidescope and 4 Grade View: Grade I Tube type: Oral Tube size: 6.0 mm Number of attempts: 1 Airway Equipment and Method: Stylet Placement Confirmation: ETT inserted through vocal cords under direct vision, positive ETCO2 and breath sounds checked- equal and bilateral Secured at: 23 cm Tube secured with: Tape Dental Injury: Teeth and Oropharynx as per pre-operative assessment

## 2024-07-24 NOTE — Discharge Instructions (Addendum)
 Post Anesthesia Guidelines  During recovery from anesthesia  You may feel drowsy and reflexes may be slowed for 24 hours:  -Do not drive, use machinery, appliances, ride bicycles or scooters  -Do not consume alcohol  -Do not make important decisions   Eating and drinking:  -Drink plenty of liquids today  -Avoid fried or spicy foods today  -Return to your regular diet slowly over the next 24 hours  -Please call if unable to keep fluids down  Surgery Discharge Instructions:  Call clinic or return to ED if you: - develop a fever greater than 101.4 - have shaking chills or are feeling ill - become short of breath - have uncontrollable nausea or vomiting - can't hold down food or liquids or feel as though you are getting dehydrated - significant bleeding from mouth or nose - any other acute events, problems, or concerns  Wound Care/Dressings/Drain Instructions:  - Sore tongue and sore throat is expected for first few days after surgery - Even some bloody drainage from the nose of mouth is expected for 48-72 hours after surgery  Medications: - Resume your regular home medications except as detailed in the medication reconciliation. Restart your Aspirin  on August 15th. - For pain, take tylenol  1000mg  every 6 hours alternating. If that is not sufficient, a stronger pain medication has been prescribed to you (oxycodone  5mg  tablet every 4 hours as needed). Do not mix with any other narcotic medication - do not take hydrocodone  when you are taking the oxycodone .  Follow Up:  - A follow up appointment should be scheduled for you after discharge as noted in your discharge paperwork  Activity/Restrictions:  - Resume your regular activities, as tolerated.  Diet: - Resume your regular diet, as tolerated  Additional Instructions: - Please take an over the counter stool softener while taking narcotic pain medication - DO NOT MIX NARCOTIC PAIN MEDICATIONS OR TAKE NARCOTIC PRESCRIPTIONS AT  THE SAME TIME - DO NOT DRIVE OR OPERATE HEAVY MACHINERY WHILE ON NARCOTICS  - DO NOT TAKE MORE THAN 4 GRAMS (4000mg ) OF TYLENOL  (ACETAMINOPHEN ) IN 24 HOURS  ENT Contact Info: ENT Front Desk Phone including questions about appointments or questions: [phone] - If after normal business hours (Monday-Friday after 5PM or Weekends/Holidays), please call same number and follow prompts for Patient Access Line. There is a physician on call for urgent matters. For life threatening emergencies, please call 911

## 2024-07-24 NOTE — Progress Notes (Signed)
 Cardiac Individual Treatment Plan  Patient Details  Name: Dave Williams MRN: [id number] Date of Birth: 11-25-1956 Referring Provider:   Flowsheet Row CARDIAC REHAB PHASE II ORIENTATION from 05/20/2024 in [facility] CARDIAC REHABILITATION  Referring Provider Court Carrier MD    Initial Encounter Date:  Flowsheet Row CARDIAC REHAB PHASE II ORIENTATION from 05/20/2024 in [place] CARDIAC REHABILITATION  Date 05/20/24    Visit Diagnosis: S/P CABG x 3  Patient's Home Medications on Admission:  Current Outpatient Medications:    acetaminophen  (TYLENOL ) 500 MG tablet, Take 1,000 mg by mouth every 6 (six) hours as needed (pain.)., Disp: , Rfl:    amLODipine  (NORVASC ) 10 MG tablet, Take 1 tablet (10 mg total) by mouth daily., Disp: 90 tablet, Rfl: 2   aspirin  EC 81 MG tablet, Take 1 tablet (81 mg total) by mouth daily. Swallow whole., Disp: 30 tablet, Rfl: 0   atorvastatin  (LIPITOR ) 80 MG tablet, Take 1 tablet (80 mg total) by mouth daily with supper., Disp: 90 tablet, Rfl: 3   diazepam  (VALIUM ) 2 MG tablet, Take 0.5 tablets (1 mg total) by mouth every 6 (six) hours as needed for anxiety. (Patient not taking: Reported on 07/22/2024), Disp: 3 tablet, Rfl: 0   HYDROcodone -acetaminophen  (NORCO/VICODIN) 5-325 MG tablet, Take 1 tablet by mouth every 6 (six) hours as needed for moderate pain (pain score 4-6)., Disp: 56 tablet, Rfl: 0   hydrOXYzine  (VISTARIL ) 25 MG capsule, Take 1 capsule (25 mg total) by mouth every 8 (eight) hours as needed., Disp: 30 capsule, Rfl: 0   isosorbide  mononitrate (IMDUR ) 30 MG 24 hr tablet, Take 1 tablet (30 mg total) by mouth daily., Disp: 90 tablet, Rfl: 1   nitroGLYCERIN  (NITROSTAT ) 0.4 MG SL tablet, Place 1 tablet (0.4 mg total) under the tongue every 5 (five) minutes as needed for chest pain. DISSOLVE 1 TABLET UNDER THE TONGUE EVERY 5 MINUTES AS NEEDED FOR CHEST PAIN. DO NOT EXCEED A TOTAL OF 3 DOSES IN 15 MINUTES. (Patient not taking: Reported on 07/22/2024), Disp: 25  tablet, Rfl: 6   pantoprazole  (PROTONIX ) 40 MG tablet, Take 1 tablet (40 mg total) by mouth daily., Disp: 30 tablet, Rfl: 0   vitamin D3 (CHOLECALCIFEROL ) 25 MCG tablet, Take 1 tablet (1,000 Units total) by mouth daily., Disp: 30 tablet, Rfl: 0  Past Medical History: Past Medical History:  Diagnosis Date   Anxiety    Chronic left hip pain    Compression fracture of spine (HCC)    multiple compression Fx with disc bulge   Coronary artery disease    Hypertension    Pneumonia    As a teenager   Stroke [facility]) 2022   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    Tobacco Use: Social History   Tobacco Use  Smoking Status Former   Current packs/day: 0.50   Types: Cigarettes  Smokeless Tobacco Never    Labs: Review Flowsheet       Latest Ref Rng & Units 02/29/2024 03/04/2024 03/06/2024  Labs for ITP Cardiac and Pulmonary Rehab  Cholestrol 0 - 200 mg/dL - - 94   LDL (calc) 0 - 99 mg/dL - - 50   HDL-C >59 mg/dL - - 22   Trlycerides <849 mg/dL - - 887   Hemoglobin J8r 4.8 - 5.6 % 5.8  - -  PH, Arterial 7.35 - 7.45 - 7.324  7.306  7.340  7.280  7.271  7.290  7.314  -  PCO2 arterial 32 - 48 mmHg - 41.3  40.1  43.4  32.8  49.2  48.8  51.8  -  Bicarbonate 20.0 - 28.0 mmol/L - 21.1  19.7  23.1  15.4  22.8  23.5  25.5  26.4  -  TCO2 22 - 32 mmol/L - 22  21  24  16  24  23  25  26  27  28  25  25   -  Acid-base deficit 0.0 - 2.0 mmol/L - 4.0  6.0  2.0  10.0  4.0  3.0  1.0  -  O2 Saturation % - 95  97  99  98  98  96  84  100  -    Details       Multiple values from one day are sorted in reverse-chronological order         Capillary Blood Glucose: Lab Results  Component Value Date   GLUCAP 113 (H) 03/08/2024   GLUCAP 121 (H) 03/07/2024   GLUCAP 112 (H) 03/07/2024   GLUCAP 111 (H) 03/07/2024   GLUCAP 136 (H) 03/06/2024     Exercise Target Goals: Exercise Program Goal: Individual exercise prescription set using results  from initial 6 min walk test and THRR while considering  patient's activity barriers and safety.   Exercise Prescription Goal: Starting with aerobic activity 30 plus minutes a day, 3 days per week for initial exercise prescription. Provide home exercise prescription and guidelines that participant acknowledges understanding prior to discharge.  Activity Barriers & Risk Stratification:  Activity Barriers & Cardiac Risk Stratification - 05/16/24 0921       Activity Barriers & Cardiac Risk Stratification   Activity Barriers Back Problems;Chest Pain/Angina;Shortness of Breath   Multiple back sugeries for back.   Cardiac Risk Stratification High          6 Minute Walk:  6 Minute Walk     Row Name 05/20/24 1119         6 Minute Walk   Phase Initial     Distance 1432 feet     Walk Time 6 minutes     # of Rest Breaks 0     MPH 2.71     METS 3.31     RPE 8     VO2 Peak 11.57     Symptoms No     Resting HR 69 bpm     Resting BP 110/70     Resting Oxygen Saturation  95 %     Exercise Oxygen Saturation  during 6 min walk 97 %     Max Ex. HR 99 bpm     Max Ex. BP 136/62     2 Minute Post BP 126/62        Oxygen Initial Assessment:   Oxygen Re-Evaluation:   Oxygen Discharge (Final Oxygen Re-Evaluation):   Initial Exercise Prescription:  Initial Exercise Prescription - 05/20/24 1100       Date of Initial Exercise RX and Referring Provider   Date 05/20/24    Referring Provider Court Carrier MD      Oxygen   Maintain Oxygen Saturation 88% or higher      Treadmill   MPH 2.7    Grade 1    Minutes 15    METs 3.44      REL-XR   Level 13    Speed 50    Minutes 15    METs 3.4      Prescription  Details   Frequency (times per week) 1   starting with one to meet OOP   Duration Progress to 30 minutes of continuous aerobic without signs/symptoms of physical distress      Intensity   THRR 40-80% of Max Heartrate 102-137    Ratings of Perceived Exertion 11-13     Perceived Dyspnea 0-4      Progression   Progression Continue to progress workloads to maintain intensity without signs/symptoms of physical distress.      Resistance Training   Training Prescription Yes    Weight 5 lb    Reps 10-15          Perform Capillary Blood Glucose checks as needed.  Exercise Prescription Changes:   Exercise Prescription Changes     Row Name 05/20/24 1100 06/12/24 1300 07/17/24 1300         Response to Exercise   Blood Pressure (Admit) 110/70 104/62 126/82     Blood Pressure (Exercise) 136/62 144/72 --     Blood Pressure (Exit) 126/62 120/64 100/60     Heart Rate (Admit) 69 bpm 83 bpm 65 bpm     Heart Rate (Exercise) 99 bpm 110 bpm 102 bpm     Heart Rate (Exit) 76 bpm 90 bpm 85 bpm     Oxygen Saturation (Admit) 95 % -- --     Oxygen Saturation (Exercise) 97 % -- --     Rating of Perceived Exertion (Exercise) 8 13 14      Symptoms none -- --     Comments walk test results -- --     Duration -- Continue with 30 min of aerobic exercise without signs/symptoms of physical distress. Continue with 30 min of aerobic exercise without signs/symptoms of physical distress.     Intensity -- THRR unchanged THRR unchanged       Progression   Progression -- Continue to progress workloads to maintain intensity without signs/symptoms of physical distress. Continue to progress workloads to maintain intensity without signs/symptoms of physical distress.       Resistance Training   Training Prescription -- Yes Yes     Weight -- 5 --     Reps -- 10-15 10-15       Treadmill   MPH -- 2.5 2.7     Grade -- 2.5 3     Minutes -- 15 15     METs -- 3.78 4.19       REL-XR   Level -- 5 6     Speed -- 56 50     Minutes -- 15 15     METs -- 2.7 3.9        Exercise Comments:   Exercise Comments     Row Name 05/22/24 9076           Exercise Comments First full day of exercise!  Patient was oriented to gym and equipment including functions, settings, policies,  and procedures.  Patient's individual exercise prescription and treatment plan were reviewed.  All starting workloads were established based on the results of the 6 minute walk test done at initial orientation visit.  The plan for exercise progression was also introduced and progression will be customized based on patient's performance and goals.          Exercise Goals and Review:   Exercise Goals     Row Name 05/20/24 1122             Exercise Goals   Increase Physical Activity Yes  Intervention Provide advice, education, support and counseling about physical activity/exercise needs.;Develop an individualized exercise prescription for aerobic and resistive training based on initial evaluation findings, risk stratification, comorbidities and participant's personal goals.       Expected Outcomes Short Term: Attend rehab on a regular basis to increase amount of physical activity.;Long Term: Add in home exercise to make exercise part of routine and to increase amount of physical activity.;Long Term: Exercising regularly at least 3-5 days a week.       Increase Strength and Stamina Yes       Intervention Provide advice, education, support and counseling about physical activity/exercise needs.;Develop an individualized exercise prescription for aerobic and resistive training based on initial evaluation findings, risk stratification, comorbidities and participant's personal goals.       Expected Outcomes Short Term: Increase workloads from initial exercise prescription for resistance, speed, and METs.;Long Term: Improve cardiorespiratory fitness, muscular endurance and strength as measured by increased METs and functional capacity ( );Short Term: Perform resistance training exercises routinely during rehab and add in resistance training at home       Able to understand and use rate of perceived exertion (RPE) scale Yes       Intervention Provide education and explanation on how to use RPE  scale       Expected Outcomes Short Term: Able to use RPE daily in rehab to express subjective intensity level;Long Term:  Able to use RPE to guide intensity level when exercising independently       Able to understand and use Dyspnea scale Yes       Intervention Provide education and explanation on how to use Dyspnea scale       Expected Outcomes Short Term: Able to use Dyspnea scale daily in rehab to express subjective sense of shortness of breath during exertion;Long Term: Able to use Dyspnea scale to guide intensity level when exercising independently       Knowledge and understanding of Target Heart Rate Range (THRR) Yes       Intervention Provide education and explanation of THRR including how the numbers were predicted and where they are located for reference       Expected Outcomes Short Term: Able to state/look up THRR;Short Term: Able to use daily as guideline for intensity in rehab;Long Term: Able to use THRR to govern intensity when exercising independently       Able to check pulse independently Yes       Intervention Provide education and demonstration on how to check pulse in carotid and radial arteries.;Review the importance of being able to check your own pulse for safety during independent exercise       Expected Outcomes Short Term: Able to explain why pulse checking is important during independent exercise;Long Term: Able to check pulse independently and accurately       Understanding of Exercise Prescription Yes       Intervention Provide education, explanation, and written materials on patient's individual exercise prescription       Expected Outcomes Short Term: Able to explain program exercise prescription;Long Term: Able to explain home exercise prescription to exercise independently          Exercise Goals Re-Evaluation :  Exercise Goals Re-Evaluation     Row Name 06/17/24 0951 07/15/24 1045           Exercise Goal Re-Evaluation   Exercise Goals Review Increase  Physical Activity;Increase Strength and Stamina;Understanding of Exercise Prescription Increase Strength and Stamina;Increase Physical Activity  Comments [name] states that he just joined the [employer] here in [place] and on the days he is not here, he goes there and swims in the pool. [name] states that on the days he is not here, he gos to the [facility] and swims in the pool.      Expected Outcomes Short: Continue to attend rehab. Long:Incorporate more exercise at home. Short: Continue to attend rehab. Long:Incorporate more exercise at home.          Discharge Exercise Prescription (Final Exercise Prescription Changes):  Exercise Prescription Changes - 07/17/24 1300       Response to Exercise   Blood Pressure (Admit) 126/82    Blood Pressure (Exit) 100/60    Heart Rate (Admit) 65 bpm    Heart Rate (Exercise) 102 bpm    Heart Rate (Exit) 85 bpm    Rating of Perceived Exertion (Exercise) 14    Duration Continue with 30 min of aerobic exercise without signs/symptoms of physical distress.    Intensity THRR unchanged      Progression   Progression Continue to progress workloads to maintain intensity without signs/symptoms of physical distress.      Resistance Training   Training Prescription Yes    Reps 10-15      Treadmill   MPH 2.7    Grade 3    Minutes 15    METs 4.19      REL-XR   Level 6    Speed 50    Minutes 15    METs 3.9          Nutrition:  Target Goals: Understanding of nutrition guidelines, daily intake of sodium 1500mg , cholesterol 200mg , calories 30% from fat and 7% or less from saturated fats, daily to have 5 or more servings of fruits and vegetables.  Biometrics:  Pre Biometrics - 05/20/24 1122       Pre Biometrics   Height 6' 1 (1.854 m)    Weight 226 lb 8 oz (102.7 kg)    Waist Circumference 31 inches    Hip Circumference 44 inches    Waist to Hip Ratio 0.7 %    BMI (Calculated) 29.89    Grip Strength 32.7 kg    Single Leg Stand 28.2 seconds            Nutrition Therapy Plan and Nutrition Goals:   Nutrition Assessments:  MEDIFICTS Score Key: >=70 Need to make dietary changes  40-70 Heart Healthy Diet <= 40 Therapeutic Level Cholesterol Diet  Flowsheet Row CARDIAC REHAB PHASE II ORIENTATION from 05/20/2024 in [facility] CARDIAC REHABILITATION  Picture Your Plate Total Score on Admission 30   Picture Your Plate Scores: <59 Unhealthy dietary pattern with much room for improvement. 41-50 Dietary pattern unlikely to meet recommendations for good health and room for improvement. 51-60 More healthful dietary pattern, with some room for improvement.  >60 Healthy dietary pattern, although there may be some specific behaviors that could be improved.    Nutrition Goals Re-Evaluation:  Nutrition Goals Re-Evaluation     Row Name 06/17/24 0947 07/15/24 1043           Goals   Nutrition Goal Loose weight. Maintain weight.      Comment [name] states he wants his goal weight to be 210-215 lbs. He is currently at 230 lbs. He states he eats lots of meat, tries to incorporate veggies and fruits. His water intake is adequate at 4-5 bottles per day, and he is an all day coffee drinker.  Will drink 3 cups of caffeinated coffee and the rest of the day he will do de-caf. [name] states that his diet is currently ok. The cancer center told him he needed to try and gain some weight because once he starts treatments it till likely make him sick and loose weight.      Expected Outcome Short: Incorporate a more colorful diet, (fruits and veggies). Long: Cut down on the caffeine. Short: Continue to attend rehab. Long: Follow the cancer center's diet, yet still following a heart healthy diet.         Nutrition Goals Discharge (Final Nutrition Goals Re-Evaluation):  Nutrition Goals Re-Evaluation - 07/15/24 1043       Goals   Nutrition Goal Maintain weight.    Comment [name] states that his diet is currently ok. The cancer center told him he needed to try  and gain some weight because once he starts treatments it till likely make him sick and loose weight.    Expected Outcome Short: Continue to attend rehab. Long: Follow the cancer center's diet, yet still following a heart healthy diet.          Psychosocial: Target Goals: Acknowledge presence or absence of significant depression and/or stress, maximize coping skills, provide positive support system. Participant is able to verbalize types and ability to use techniques and skills needed for reducing stress and depression.  Initial Review & Psychosocial Screening:  Initial Psych Review & Screening - 05/16/24 0944       Initial Review   Current issues with Current Sleep Concerns      Family Dynamics   Good Support System? Yes      Barriers   Psychosocial barriers to participate in program The patient should benefit from training in stress management and relaxation.;There are no identifiable barriers or psychosocial needs.      Screening Interventions   Interventions To provide support and resources with identified psychosocial needs;Encouraged to exercise;Provide feedback about the scores to participant    Expected Outcomes Short Term goal: Utilizing psychosocial counselor, staff and physician to assist with identification of specific Stressors or current issues interfering with healing process. Setting desired goal for each stressor or current issue identified.;Long Term Goal: Stressors or current issues are controlled or eliminated.;Short Term goal: Identification and review with participant of any Quality of Life or Depression concerns found by scoring the questionnaire.;Long Term goal: The participant improves quality of Life and PHQ9 Scores as seen by post scores and/or verbalization of changes          Quality of Life Scores:  Quality of Life - 05/20/24 1123       Quality of Life   Select Quality of Life      Quality of Life Scores   Health/Function Pre 19.2 %     Socioeconomic Pre 26.57 %    Psych/Spiritual Pre 12.93 %    Family Pre 27.5 %    GLOBAL Pre 20.03 %         Scores of 19 and below usually indicate a poorer quality of life in these areas.  A difference of  2-3 points is a clinically meaningful difference.  A difference of 2-3 points in the total score of the Quality of Life Index has been associated with significant improvement in overall quality of life, self-image, physical symptoms, and general health in studies assessing change in quality of life.  PHQ-9: Review Flowsheet       05/20/2024  Depression screen [facility] 2/9  Decreased Interest 1  Down, Depressed, Hopeless 1  PHQ - 2 Score 2  Altered sleeping 2  Tired, decreased energy 3  Change in appetite 0  Feeling bad or failure about yourself  1  Trouble concentrating 0  Moving slowly or fidgety/restless 1  Suicidal thoughts 0  PHQ-9 Score 9  Difficult doing work/chores Not difficult at all   Interpretation of Total Score  Total Score Depression Severity:  1-4 = Minimal depression, 5-9 = Mild depression, 10-14 = Moderate depression, 15-19 = Moderately severe depression, 20-27 = Severe depression   Psychosocial Evaluation and Intervention:  Psychosocial Evaluation - 05/16/24 0945       Psychosocial Evaluation & Interventions   Interventions Stress management education;Relaxation education;Encouraged to exercise with the program and follow exercise prescription    Comments 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 He denies any depression, anxiety or stressors. He does report some difficulty staying asleep and has been taking Advil PM long term for sleep. He lives alone. He says he has friends and neigbors that would help him out if needed. 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 He has a $25 copayment and is about to meet his out of pocket maximun which will end the copayment. He is only going to do 1 day/week until he meets the  OOP and then go to 3 days/week. His goals for the program are to get his stamina back; improve his muscle tone and get back in shape overall. He has no barriers identified to complete the program.    Expected Outcomes Short Term: Patient will start the program and attend consistently. Long Term: Patient will complete the program meeting personal goals.    Continue Psychosocial Services  Follow up required by staff          Psychosocial Re-Evaluation:  Psychosocial Re-Evaluation     Row Name 06/17/24 0946 07/15/24 1034           Psychosocial Re-Evaluation   Current issues with None Identified Current Stress Concerns      Comments [name] identifies no stressors in his life at the moment. 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 [name] is currently stressed out and anxious about his cancer issues going on. He has his biopsy next week, so once they do that he will have a better idea of what the treatment options are and how long they will be.      Expected Outcomes Short: Continue to attend rehab. Long: Continue to have a stress free lifestyle. Short: Continue to attend rehab. Long: Find stress outlets to help him with the anxiety he is experiencing.      Interventions Encouraged to attend Cardiac Rehabilitation for the exercise Encouraged to attend Cardiac Rehabilitation for the exercise;Stress management education;Relaxation education      Continue Psychosocial Services  Follow up required by staff Follow up required by staff         Psychosocial Discharge (Final Psychosocial Re-Evaluation):  Psychosocial Re-Evaluation - 07/15/24 1034       Psychosocial Re-Evaluation   Current issues with Current Stress Concerns    Comments [name] is currently stressed out and anxious about his cancer issues going on. He has his biopsy next week, so once they do that he will have a better idea of what  the treatment options are and how long they will be.    Expected Outcomes Short: Continue to attend rehab. Long: Find stress outlets to help him with the anxiety he is experiencing.    Interventions Encouraged to attend Cardiac Rehabilitation for the exercise;Stress management education;Relaxation education    Continue Psychosocial Services  Follow up required by staff          Vocational Rehabilitation: Provide vocational rehab assistance to qualifying candidates.   Vocational Rehab Evaluation & Intervention:  Vocational Rehab - 05/16/24 9061       Initial Vocational Rehab Evaluation & Intervention   Assessment shows need for Vocational Rehabilitation No      Vocational Rehab Re-Evaulation   Comments Disabled.          Education: Education Goals: Education classes will be provided on a weekly basis, covering required topics. Participant will state understanding/return demonstration of topics presented.  Learning Barriers/Preferences:  Learning Barriers/Preferences - 05/16/24 9061       Learning Barriers/Preferences   Learning Barriers None    Learning Preferences Written Material;Audio;Skilled Demonstration          Education Topics: Hypertension, Hypertension Reduction -Define heart disease and high blood pressure. Discus how high blood pressure affects the body and ways to reduce high blood pressure.   Exercise and Your Heart -Discuss why it is important to exercise, the FITT principles of exercise, normal and abnormal responses to exercise, and how to exercise safely.   Angina -Discuss definition of angina, causes of angina, treatment of angina, and how to decrease risk of having angina. Flowsheet Row CARDIAC REHAB PHASE II EXERCISE from 07/17/2024 in [place] CARDIAC REHABILITATION  Date 05/22/24  Educator [facility]  Instruction Review Code 1- Verbalizes Understanding    Cardiac Medications -Review what the following cardiac medications are used for, how they  affect the body, and side effects that may occur when taking the medications.  Medications include Aspirin , Beta blockers, calcium  channel blockers, ACE Inhibitors, angiotensin receptor blockers, diuretics, digoxin, and antihyperlipidemics. Flowsheet Row CARDIAC REHAB PHASE II EXERCISE from 07/17/2024 in [place] CARDIAC REH[facility]ILITATION  Date 07/10/24  Educator jh  Instruction Review Code 1- Verbalizes Understanding    Congestive Heart Failure -Discuss the definition of CHF, how to live with CHF, the signs and symptoms of CHF, and how keep track of weight and sodium intake.   Heart Disease and Intimacy -Discus the effect sexual activity has on the heart, how changes occur during intimacy as we age, and safety during sexual activity. Flowsheet Row CARDIAC REHAB PHASE II EXERCISE from 07/17/2024 in [place] CARDIAC REH[facility]ILITATION  Date 06/12/24  Educator [facility]  Instruction Review Code 1- Verbalizes Understanding    Smoking Cessation / COPD -Discuss different methods to quit smoking, the health benefits of quitting smoking, and the definition of COPD. Flowsheet Row CARDIAC REHAB PHASE II EXERCISE from 07/17/2024 in [facility]  Date 07/17/24  Educator [facility]  Instruction Review Code 1- Verbalizes Understanding    Nutrition I: Fats -Discuss the types of cholesterol, what cholesterol does to the heart, and how cholesterol levels can be controlled.   Nutrition II: Labels -Discuss the different components of food labels and how to read food label   Heart Parts/Heart Disease and PAD -Discuss the anatomy of the heart, the pathway of blood circulation through the heart, and these are affected by heart disease.   Stress I: Signs and Symptoms -Discuss the causes of stress, how stress may lead to anxiety and  depression, and ways to limit stress.   Stress II: Relaxation -Discuss different types of relaxation techniques to limit stress.   Warning Signs of Stroke /  TIA -Discuss definition of a stroke, what the signs and symptoms are of a stroke, and how to identify when someone is having stroke.   Knowledge Questionnaire Score:  Knowledge Questionnaire Score - 05/20/24 1043       Knowledge Questionnaire Score   Pre Score 25/28          Core Components/Risk Factors/Patient Goals at Admission:  Personal Goals and Risk Factors at Admission - 05/20/24 1123       Core Components/Risk Factors/Patient Goals on Admission    Weight Management Weight Loss;Yes    Intervention Weight Management: Develop a combined nutrition and exercise program designed to reach desired caloric intake, while maintaining appropriate intake of nutrient and fiber, sodium and fats, and appropriate energy expenditure required for the weight goal.;Weight Management: Provide education and appropriate resources to help participant work on and attain dietary goals.;Weight Management/Obesity: Establish reasonable short term and long term weight goals.    Admit Weight 226 lb 8 oz (102.7 kg)    Goal Weight: Short Term 221 lb (100.2 kg)    Goal Weight: Long Term 216 lb (98 kg)    Expected Outcomes Short Term: Continue to assess and modify interventions until short term weight is achieved;Long Term: Adherence to nutrition and physical activity/exercise program aimed toward attainment of established weight goal;Weight Loss: Understanding of general recommendations for a balanced deficit meal plan, which promotes 1-2 lb weight loss per week and includes a negative energy balance of [phone] kcal/d;Understanding recommendations for meals to include 15-35% energy as protein, 25-35% energy from fat, 35-60% energy from carbohydrates, less than 200mg  of dietary cholesterol, 20-35 gm of total fiber daily;Understanding of distribution of calorie intake throughout the day with the consumption of 4-5 meals/snacks    Improve shortness of breath with ADL's Yes    Intervention Provide education,  individualized exercise plan and daily activity instruction to help decrease symptoms of SOB with activities of daily living.    Expected Outcomes Short Term: Improve cardiorespiratory fitness to achieve a reduction of symptoms when performing ADLs;Long Term: Be able to perform more ADLs without symptoms or delay the onset of symptoms    Hypertension Yes    Intervention Provide education on lifestyle modifcations including regular physical activity/exercise, weight management, moderate sodium restriction and increased consumption of fresh fruit, vegetables, and low fat dairy, alcohol moderation, and smoking cessation.;Monitor prescription use compliance.    Expected Outcomes Short Term: Continued assessment and intervention until BP is < 140/60mm HG in hypertensive participants. < 130/89mm HG in hypertensive participants with diabetes, heart failure or chronic kidney disease.;Long Term: Maintenance of blood pressure at goal levels.    Lipids Yes    Intervention Provide education and support for participant on nutrition & aerobic/resistive exercise along with prescribed medications to achieve LDL 70mg , HDL >40mg .    Expected Outcomes Short Term: Participant states understanding of desired cholesterol values and is compliant with medications prescribed. Participant is following exercise prescription and nutrition guidelines.;Long Term: Cholesterol controlled with medications as prescribed, with individualized exercise RX and with personalized nutrition plan. Value goals: LDL < 70mg , HDL > 40 mg.          Core Components/Risk Factors/Patient Goals Review:   Goals and Risk Factor Review     Row Name 06/17/24 [phone] 07/15/24 1044  Core Components/Risk Factors/Patient Goals Review   Personal Goals Review Weight Management/Obesity;Lipids Weight Management/Obesity;Lipids;Hypertension      Review [name] states he checks all his vitals regularly at home. His BP runs good, usually only 4-10 points  off of what we get. His pulse ox runs about 95-97% which is good for someone who used to be a heavy smoker. Takes all medications as prescribed. [name] states that he checks his BP 3-4 times a day. He also checks his pulse oximetry at home which it runs 96-97%, which he considers good since he smoked for 40 years. He takes all his medications as prescribed.      Expected Outcomes Short: Continue to attend rehab. Long: Continue checking vitals at home. Short: Continue to attend rehab. Long: Continue checking vitals at home.         Core Components/Risk Factors/Patient Goals at Discharge (Final Review):   Goals and Risk Factor Review - 07/15/24 1044       Core Components/Risk Factors/Patient Goals Review   Personal Goals Review Weight Management/Obesity;Lipids;Hypertension    Review [name] states that he checks his BP 3-4 times a day. He also checks his pulse oximetry at home which it runs 96-97%, which he considers good since he smoked for 40 years. He takes all his medications as prescribed.    Expected Outcomes Short: Continue to attend rehab. Long: Continue checking vitals at home.          ITP Comments:  ITP Comments     Row Name 05/16/24 [phone] 05/20/24 1143 05/22/24 0923 05/29/24 0928 06/24/24 0933   ITP Comments Virtual orientation visit completed for Cardiac rehab with S/P CABGx3. On-site orientation visit scheduled for 05/20/24 at 10. Patient arrived for 1st visit/orientation/education at 1000. Patient was referred to CR by Dr. Linnie Rayas and attending cardiologist Peter [place] due to S/P CABGx3. During orientation advised patient on arrival and appointment times what to wear, what to do before, during and after exercise. Reviewed attendance and class policy.  Pt is scheduled to return Cardiac Rehab on 05/22/24 at 915. Pt was advised to come to class 15 minutes before class starts.  Discussed RPE/Dpysnea scales. Patient participated in warm up stretches. Patient was able to complete 6  minute walk test.  Telemetry:NSR. Patient was measured for the equipment. Discussed equipment safety with patient. Took patient pre-anthropometric measurements. Patient finished visit at 1045. First full day of exercise!  Patient was oriented to gym and equipment including functions, settings, policies, and procedures.  Patient's individual exercise prescription and treatment plan were reviewed.  All starting workloads were established based on the results of the 6 minute walk test done at initial orientation visit.  The plan for exercise progression was also introduced and progression will be customized based on patient's performance and goals. 30 day review completed. ITP sent to Dr.Jonathan Branch, Medical Director of Cardiac Rehab. Continue with ITP unless changes are made by physician. New to program. He has completed 1 session since his orientation visit. He is only attending 1 day/week due to his co-payment. Patient will be out rest of week for oncology work up and biopsy.    Row Name 06/26/24 0821 07/24/24 0951         ITP Comments 30 day review completed. ITP sent to Dr. Dorn Ross, Medical Director of Cardiac Rehab. Continue with ITP unless changes are made by physician. 30 day review completed. ITP sent to Dr. Dorn Ross, Medical Director of Cardiac Rehab. Continue with ITP unless changes are  made by physician.  [name] is out this week with MD appt Monday and biopsy surgery today.         Comments: 30 day review

## 2024-07-24 NOTE — Transfer of Care (Signed)
 Immediate Anesthesia Transfer of Care Note  Patient: Dave Williams  Procedure(s) Performed: LARYNGOSCOPY, DIRECT (Throat) ESOPHAGOSCOPY, RIGID (Throat)  Patient Location: PACU  Anesthesia Type:General  Level of Consciousness: drowsy  Airway & Oxygen Therapy: Patient Spontanous Breathing and Patient connected to face mask oxygen  Post-op Assessment: Report given to RN and Post -op Vital signs reviewed and stable  Post vital signs: Reviewed and stable  Last Vitals:  Vitals Value Taken Time  BP 138/85   Temp 97.7   Pulse 83 07/24/24 17:15  Resp 17   SpO2 98 % 07/24/24 17:15  Vitals shown include unfiled device data.  Last Pain:  Vitals:   07/24/24 1322  TempSrc:   PainSc: 0-No pain         Complications: No notable events documented.

## 2024-07-24 NOTE — Progress Notes (Signed)
 Dr. Keneth made aware of patient taking 81mg  aspirin  this am.

## 2024-07-25 ENCOUNTER — Encounter (HOSPITAL_COMMUNITY): Payer: Self-pay | Admitting: Otolaryngology

## 2024-07-26 ENCOUNTER — Encounter (HOSPITAL_COMMUNITY)

## 2024-07-26 ENCOUNTER — Encounter (HOSPITAL_COMMUNITY)
Admission: RE | Admit: 2024-07-26 | Discharge: 2024-07-26 | Disposition: A | Discharge status: Home / Self Care | Source: Ambulatory Visit | Attending: Cardiovascular Disease | Admitting: Cardiovascular Disease

## 2024-07-26 DIAGNOSIS — Z951 Presence of aortocoronary bypass graft: Secondary | ICD-10-CM

## 2024-07-26 DIAGNOSIS — Z48812 Encounter for surgical aftercare following surgery on the circulatory system: Secondary | ICD-10-CM | POA: Diagnosis not present

## 2024-07-26 LAB — SURGICAL PATHOLOGY

## 2024-07-26 NOTE — Anesthesia Postprocedure Evaluation (Signed)
 Anesthesia Post Note  Patient: Dave Williams  Procedure(s) Performed: LARYNGOSCOPY, DIRECT (Throat) ESOPHAGOSCOPY, RIGID (Throat)     Patient location during evaluation: PACU Anesthesia Type: General Level of consciousness: sedated and patient cooperative Pain management: pain level controlled Vital Signs Assessment: post-procedure vital signs reviewed and stable Respiratory status: spontaneous breathing Cardiovascular status: stable Anesthetic complications: no   No notable events documented.  Last Vitals:  Vitals:   07/24/24 1745 07/24/24 1755  BP: (!) 147/91 (!) 134/91  Pulse: 76 76  Resp: 13 (!) 6  Temp:  36.5 C  SpO2: 93% 92%    Last Pain:  Vitals:   07/24/24 1755  TempSrc:   PainSc: 3                  Norleen Pope

## 2024-07-26 NOTE — Progress Notes (Signed)
 Reviewed home exercise with patient. He is planning on walking with cooler weather, riding a stationary bike at home and swimming at the [facility]. He is keeping active.

## 2024-07-26 NOTE — Progress Notes (Signed)
 Daily Session Note  Patient Details  Name: Dave Williams MRN: [id number] Date of Birth: Feb 02, 1956 Referring Provider:   Flowsheet Row CARDIAC REHAB PHASE II ORIENTATION from 05/20/2024 in [facility] CARDIAC REHABILITATION  Referring Provider Court Carrier MD    Encounter Date: 07/26/2024  Check In:  Session Check In - 07/26/24 0927       Check-In   Supervising physician immediately available to respond to emergencies See telemetry face sheet for immediately available MD    Location [facility]    Staff Present Laymon Rattler, BSN, RN, Rosalba Gelineau, MA, RCEP, CCRP, CCET    Virtual Visit No    Medication changes reported     No    Fall or balance concerns reported    No    Tobacco Cessation No Change    Warm-up and Cool-down Performed on first and last piece of equipment    Resistance Training Performed Yes    VAD Patient? No    PAD/SET Patient? No      Pain Assessment   Currently in Pain? No/denies          Capillary Blood Glucose: No results found for this or any previous visit (from the past 24 hours).    Social History   Tobacco Use  Smoking Status Former   Current packs/day: 0.50   Types: Cigarettes  Smokeless Tobacco Never    Goals Met:  Independence with exercise equipment Exercise tolerated well No report of concerns or symptoms today Strength training completed today  Goals Unmet:  Not Applicable  Comments: Pt able to follow exercise prescription today without complaint.  Will continue to monitor for progression.

## 2024-07-29 ENCOUNTER — Telehealth (HOSPITAL_COMMUNITY): Payer: Self-pay | Admitting: Physician Assistant

## 2024-07-29 ENCOUNTER — Encounter (HOSPITAL_COMMUNITY)
Admission: RE | Admit: 2024-07-29 | Discharge: 2024-07-29 | Disposition: A | Discharge status: Home / Self Care | Source: Ambulatory Visit | Attending: Cardiovascular Disease | Admitting: Cardiovascular Disease

## 2024-07-29 ENCOUNTER — Other Ambulatory Visit: Payer: Self-pay

## 2024-07-29 ENCOUNTER — Telehealth: Payer: Self-pay | Admitting: Oncology

## 2024-07-29 ENCOUNTER — Encounter: Payer: Self-pay | Admitting: Medical Oncology

## 2024-07-29 DIAGNOSIS — C139 Malignant neoplasm of hypopharynx, unspecified: Secondary | ICD-10-CM

## 2024-07-29 DIAGNOSIS — Z48812 Encounter for surgical aftercare following surgery on the circulatory system: Secondary | ICD-10-CM | POA: Diagnosis not present

## 2024-07-29 DIAGNOSIS — Z951 Presence of aortocoronary bypass graft: Secondary | ICD-10-CM

## 2024-07-29 NOTE — Progress Notes (Signed)
 Daily Session Note  Patient Details  Name: Stryker Veasey MRN: [id number] Date of Birth: July 23, 1956 Referring Provider:   Flowsheet Row CARDIAC REHAB PHASE II ORIENTATION from 05/20/2024 in [facility] C[facility]AC REH[facility]IL[facility]  Referring Provider Court Carrier MD    Encounter Date: 07/29/2024  Check In:  Session Check In - 07/29/24 0924       Check-In   Supervising physician immediately available to respond to emergencies See telemetry face sheet for immediately available MD    Location [facility]    Staff Present Laymon Rattler, BSN, RN, WTA-C;Heather Con, BS, Exercise Physiologist    Virtual Visit No    Medication changes reported     No    Fall or balance concerns reported    No    Tobacco Cessation No Change    Warm-up and Cool-down Performed on first and last piece of equipment    Resistance Training Performed Yes    VAD Patient? No    PAD/SET Patient? No      Pain Assessment   Currently in Pain? No/denies          Capillary Blood Glucose: No results found for this or any previous visit (from the past 24 hours).    Social History   Tobacco Use  Smoking Status Former   Current packs/day: 0.50   Types: Cigarettes  Smokeless Tobacco Never    Goals Met:  Independence with exercise equipment Exercise tolerated well No report of concerns or symptoms today Strength training completed today  Goals Unmet:  Not Applicable  Comments: Pt able to follow exercise prescription today without complaint.  Will continue to monitor for progression.

## 2024-07-29 NOTE — Telephone Encounter (Signed)
 I called and spoke to Dave Williams to review the biopsy results from 07/24/2024.  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  We will order a PET CT scan for staging purposes.  Patient will follow-up with Dr. Autumn to finalize treatment recommendations along with Dr. Izell from radiation oncology.   Dave Williams expressed understanding of the plan provided and was agreeable to move forward.

## 2024-07-29 NOTE — Telephone Encounter (Signed)
 Scheduled appointments per referral. Talked with the patient and he is aware of the appointment time and date as well as the address. Patient was informed to arrive 10-15 minutes prior with updated insurance information. Patient was also informed that two guests are permitted but needs to be at least 66 years of age. All questions were answered.

## 2024-07-29 NOTE — Progress Notes (Signed)
  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    Cancer Staging  No matching staging information was found for the patient.   SDOH Screening and Interventions Updated:  No  SDOH Screenings   Food Insecurity: No Food Insecurity (03/05/2024)  Housing: Low Risk  (03/05/2024)  Transportation Needs: No Transportation Needs (03/05/2024)  Utilities: Not At Risk (03/05/2024)  Depression (PHQ2-9): Medium Risk (05/20/2024)  Social Connections: Moderately Integrated (03/05/2024)  Tobacco Use: Medium Risk (07/22/2024)     Genetics Assessment Completed:  No Genetics Referral Made:  no   Colene KYM Raider, RN, BSN, [facility] Oncology Nurse Navigator, [facility] 07/29/2024 12:52 PM  Care Team Updated:  Yes

## 2024-07-30 ENCOUNTER — Telehealth: Payer: Self-pay | Admitting: Radiation Oncology

## 2024-07-30 ENCOUNTER — Other Ambulatory Visit: Payer: Self-pay | Admitting: Orthopaedic Surgery

## 2024-07-30 NOTE — Telephone Encounter (Signed)
 Left vm to pt letting him know meds had been refused and if he had questions to give us  a call back.

## 2024-07-30 NOTE — Telephone Encounter (Signed)
 Called pt to schedule consult 8/26@9 :30am. Pt was pleasant and agreeable to appt.

## 2024-07-31 ENCOUNTER — Encounter (HOSPITAL_COMMUNITY)

## 2024-07-31 ENCOUNTER — Ambulatory Visit (INDEPENDENT_AMBULATORY_CARE_PROVIDER_SITE_OTHER): Admitting: Otolaryngology

## 2024-07-31 ENCOUNTER — Encounter (INDEPENDENT_AMBULATORY_CARE_PROVIDER_SITE_OTHER): Payer: Self-pay | Admitting: Otolaryngology

## 2024-07-31 VITALS — BP 114/73 | HR 79 | Ht 73.0 in | Wt 230.0 lb

## 2024-07-31 DIAGNOSIS — C139 Malignant neoplasm of hypopharynx, unspecified: Secondary | ICD-10-CM

## 2024-07-31 NOTE — Progress Notes (Signed)
 Dear Dr. Myra, Here is my assessment for our mutual patient, Dave Williams. Thank you for allowing me the opportunity to care for your patient. Please do not hesitate to contact me should you have any other questions. Sincerely, Dr. Eldora Blanch  Otolaryngology Clinic Note Referring provider: Dr. Myra HPI:  Dave Williams is a 68 y.o. Williams kindly referred by Dr. Myra for evaluation of supraglottic mass and neck mass  Initial visit (06/2024): He reports that he noted a right neck mass last year, was not painful, and then went away. Then re-appeared in January 2025 (just noticed it then), and never went away. It has grown in size, some discomfort. Additional symptoms include dysphagia (sometimes) and odynophagia - comes and goes - mostly occurring with solids. Patient otherwise denies: - aspiration episodes or PNA, unintentional weight loss - changes in voice, shortness of breath, hemoptysis - ear pain  --------------------------------------------------------- 07/31/2024 Returns after DL/Bx. Neck mass stable, pain doing ok, no change in voice, hemoptysis, ear pain. Some dysphagia. We discussed his pathology and discussed next steps.     Volunteer EMT and firefighter  ENT Surgery: no Personal or FHx of bleeding dz or anesthesia difficulty: no  GLP-1: no AP/AC: ASA 81, and Eliquis   Tobacco: prior, quit 50 pack year. Alcohol: prior heavy use, quit 15 years ago.  PMHx: CAD s/p CABG (March 2025), HTN, HLD Back pain on Hydrocodone   Independent Review of Additional Tests or Records:  CT Neck 06/16/2024 independently interpreted: noted bulky right necrotic lymph nodes level 3, unclear if ENE but appears to be concerning re: tissue planes on more superior node; right AE fold mass it appears, with prominent lingual tonsils as well; do not see obvious cricoid or thyroid  cartilage invasion; overall, concerning for carcinoma. Referral notes reviewed CBC and CMP 06/19/2024: WBC 8.7, Eos 200;  BUN 19/1.34 Path 07/24/2024:   PMH/Meds/All/SocHx/FamHx/ROS:   Past Medical History:  Diagnosis Date   Anxiety    Chronic left hip pain    Compression fracture of spine (HCC)    multiple compression Fx with disc bulge   Coronary artery disease    Hypertension    Pneumonia    As a teenager   Stroke [facility]) 2022   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     Past Surgical History:  Procedure Laterality Date   BACK SURGERY     Radiocartography T2-T12   CORONARY ARTERY BYPASS GRAFT N/A 03/04/2024   Procedure: CORONARY ARTERY BYPASS GRAFTING (CABG) TIMES THREE UTILIZING LEFT INTERNAL MAMMARY ARTERY AND ENDOSCOPIC VEIN HARVEST RIGHT GREATER SAPHENOUS VEIN;  Surgeon: Shyrl Linnie KIDD, MD;  Location: [facility] OR;  Service: Open Heart Surgery;  Laterality: N/A;   DIRECT LARYNGOSCOPY N/A 07/24/2024   Procedure: LARYNGOSCOPY, DIRECT;  Surgeon: Blanch Eldora NOVAK, MD;  Location: [facility] OR;  Service: ENT;  Laterality: N/A;  Direct Laryngoscopy with biopsy with use of operating telescope   KNEE ARTHROSCOPY Bilateral    LEFT HEART CATH AND CORONARY ANGIOGRAPHY N/A 11/28/2023   Procedure: LEFT HEART CATH AND CORONARY ANGIOGRAPHY;  Surgeon: Swaziland, Peter M, MD;  Location: [facility] INVASIVE CV LAB;  Service: Cardiovascular;  Laterality: N/A;   PILONIDAL CYST EXCISION     In patients 20s   RIGID ESOPHAGOSCOPY N/A 07/24/2024   Procedure: ESOPHAGOSCOPY, RIGID;  Surgeon: Blanch Eldora NOVAK, MD;  Location: [facility] OR;  Service: ENT;  Laterality: N/A;   TEE WITHOUT CARDIOVERSION N/A 03/04/2024  Procedure: ECHOCARDIOGRAM, TRANSESOPHAGEAL;  Surgeon: Shyrl Linnie KIDD, MD;  Location: [facility] OR;  Service: Open Heart Surgery;  Laterality: N/A;    Family History  Problem Relation Age of Onset   Cancer Mother    Hypertension Mother    Heart failure Mother    Cancer Father    Hypertension Father    Heart failure Father    Heart disease Father      Social Connections:  Moderately Integrated (03/05/2024)   Social Connection and Isolation Panel    Frequency of Communication with Friends and Family: More than three times a week    Frequency of Social Gatherings with Friends and Family: More than three times a week    Attends Religious Services: More than 4 times per year    Active Member of [employer] or Organizations: Yes    Attends Banker Meetings: 1 to 4 times per year    Marital Status: Divorced      Current Outpatient Medications:    acetaminophen  (TYLENOL ) 500 MG tablet, Take 1,000 mg by mouth every 6 (six) hours as needed (pain.)., Disp: , Rfl:    amLODipine  (NORVASC ) 10 MG tablet, Take 1 tablet (10 mg total) by mouth daily., Disp: 90 tablet, Rfl: 2   aspirin  EC 81 MG tablet, Take 1 tablet (81 mg total) by mouth daily. Swallow whole., Disp: , Rfl:    atorvastatin  (LIPITOR ) 80 MG tablet, Take 1 tablet (80 mg total) by mouth daily with supper., Disp: 90 tablet, Rfl: 3   diazepam  (VALIUM ) 2 MG tablet, Take 0.5 tablets (1 mg total) by mouth every 6 (six) hours as needed for anxiety., Disp: 3 tablet, Rfl: 0   hydrOXYzine  (VISTARIL ) 25 MG capsule, Take 1 capsule (25 mg total) by mouth every 8 (eight) hours as needed., Disp: 30 capsule, Rfl: 0   isosorbide  mononitrate (IMDUR ) 30 MG 24 hr tablet, Take 1 tablet (30 mg total) by mouth daily., Disp: 90 tablet, Rfl: 1   oxyCODONE  (ROXICODONE ) 5 MG immediate release tablet, Take 1 tablet (5 mg total) by mouth every 4 (four) hours as needed for up to 7 days for severe pain (pain score 7-10)., Disp: 20 tablet, Rfl: 0   pantoprazole  (PROTONIX ) 40 MG tablet, Take 1 tablet (40 mg total) by mouth daily., Disp: 30 tablet, Rfl: 0   vitamin D3 (CHOLECALCIFEROL ) 25 MCG tablet, Take 1 tablet (1,000 Units total) by mouth daily., Disp: 30 tablet, Rfl: 0   nitroGLYCERIN  (NITROSTAT ) 0.4 MG SL tablet, Place 1 tablet (0.4 mg total) under the tongue every 5 (five) minutes as needed for chest pain. DISSOLVE 1 TABLET UNDER THE  TONGUE EVERY 5 MINUTES AS NEEDED FOR CHEST PAIN. DO NOT EXCEED A TOTAL OF 3 DOSES IN 15 MINUTES. (Patient not taking: Reported on 07/31/2024), Disp: 25 tablet, Rfl: 6   Physical Exam:   BP 114/73 (BP Location: Left Arm, Patient Position: Sitting, Cuff Size: Large)   Pulse 79   Ht 6' 1 (1.854 m)   Wt 230 lb (104.3 kg)   SpO2 92%   BMI 30.34 kg/m   Salient findings:  CN II-XII intact Anterior rhinoscopy: Septum intact; bilateral inferior turbinates without significant hypertrophy No lesions of oral cavity/oropharynx; dentition poor No obviously palpable neck masses/lymphadenopathy/thyromegaly EXCEPT relatively fixed right level 2 and 3 masses, stable No respiratory distress or stridor; easily lays flat; voice strong; TFL was indicated to better evaluate the proximal airway, given the patient's history and exam findings, and is detailed below.  Seprately  Identifiable Procedures:  Prior to initiating any procedures, risks/benefits/alternatives were explained to the patient and verbal consent obtained. Procedure Note Pre-procedure diagnosis: Hypopharynx mass, neck mass, concern for malignancy Post-procedure diagnosis: Same Procedure: Transnasal Fiberoptic Laryngoscopy, CPT 31575 - Mod 25 Indication: see above Complications: None apparent EBL: 0 mL  The procedure was undertaken to further evaluate the patient's complaint above, with mirror exam inadequate for appropriate examination due to gag reflex and poor patient tolerance  Procedure:  Patient was identified as correct patient. Verbal consent was obtained. The nose was sprayed with oxymetazoline  and 4% lidocaine . The The flexible laryngoscope was passed through the nose to view the nasal cavity, pharynx (oropharynx, hypopharynx) and larynx.  The larynx was examined at rest and during multiple phonatory tasks. Documentation was obtained and reviewed with patient. The scope was removed. The patient tolerated the procedure  well.  Findings: The nasal cavity and nasopharynx did not reveal any masses or lesions, mucosa appeared to be without obvious lesions. The tongue base, pharyngeal walls, piriform sinuses, vallecula, epiglottis and postcricoid region are normal in appearance EXCEPT - right hypopharynx mass which appears to involve the pyriform and likely arytenoid submucosally. The visualized portion of the subglottis and proximal trachea is widely patent. The vocal folds are mobile bilaterally. There are no lesions on the free edge of the vocal folds. No airway compromise concern currently  Electronically signed by: Eldora KATHEE Blanch, MD 07/31/2024 11:36 AM   Impression & Plans:  Hutch Rhett is a 68 y.o. Williams with:  1. Cancer of hypopharynx (HCC)    Hypopharynx mass post-cricoid and pyriform with concern for submucosal extent over AE fold. No gross cervical esophageal extension noted.   We discussed options: CRT v/s likely total laryngectomy/neck dissections/likely post op radiation.  Patient opted for CRT and does not even wish for any surgical consultation.  He has a PET upcoming and Rad/Onc + Heme Onc visits scheduled Will f/u with him 3 months post treatment, ~March 2026  [phone]: Best contact number.   See below regarding exact medications prescribed this encounter including dosages and route: No orders of the defined types were placed in this encounter.     Thank you for allowing me the opportunity to care for your patient. Please do not hesitate to contact me should you have any other questions.  Sincerely, Eldora Blanch, MD Otolaryngologist (ENT), [facility] Phone: [phone] Fax: [phone]  07/31/2024, 11:36 AM   MDM:  [id number] Complexity/Problems addressed: high - life threatening illness Data complexity: low - Morbidity: high - life threatening problem, decision for treatment

## 2024-08-01 ENCOUNTER — Encounter (HOSPITAL_COMMUNITY)
Admission: RE | Admit: 2024-08-01 | Discharge: 2024-08-01 | Disposition: A | Discharge status: Home / Self Care | Source: Ambulatory Visit | Attending: Physician Assistant | Admitting: Physician Assistant

## 2024-08-01 DIAGNOSIS — C139 Malignant neoplasm of hypopharynx, unspecified: Secondary | ICD-10-CM | POA: Diagnosis present

## 2024-08-01 MED ORDER — FLUDEOXYGLUCOSE F - 18 (FDG) INJECTION
12.2800 | Freq: Once | INTRAVENOUS | Status: AC | PRN
  Administered 2024-08-01: 12.28 via INTRAVENOUS

## 2024-08-01 NOTE — Progress Notes (Signed)
 Oncology Nurse Navigator Documentation   Placed introductory call to new referral patient Dave Williams. Introduced myself as the H&N oncology nurse navigator that works with Dr. Izell and Dr. Autumn to whom he has been referred by Dr. Tobie. He confirmed understanding of referral. Briefly explained my role as his navigator, provided my contact information.  Confirmed understanding of upcoming appts and [facility] location, explained arrival and registration process. I encouraged him to call with questions/concerns as he moves forward with appts and procedures.   He verbalized understanding of information provided, expressed appreciation for my call.   Navigator Initial Assessment Employment Status: he is working as a Therapist, nutritional / STD: no Living Situation: alone Support System: family PCP: Geni Clause NP PCD:  Financial Concerns: no Transportation Needs: no Sensory Deficits: no Chiropodist Needed:  no Ambulation Needs: no Psychosocial Needs:  no Concerns/Needs Understanding Cancer:  addressed/answered by navigator to best of ability Self-Expressed Needs: no   Tourist information centre manager, BSN, OCN Head & Neck Oncology Nurse Navigator [facility] Phone # [phone]  Fax # [phone]

## 2024-08-02 ENCOUNTER — Encounter (HOSPITAL_COMMUNITY)

## 2024-08-02 ENCOUNTER — Encounter: Payer: Self-pay | Admitting: Radiology

## 2024-08-02 NOTE — Progress Notes (Signed)
 Head and Neck Cancer Location of Tumor / Histology:    Patient presented with symptoms of:  Right neck mass last year that was not painful and then went away. Then reappeared in January 2025 and has not gone away. It has grown in size with some discomfort. Additional symptoms include dysphagia and odynophagia comes and goes mostly with solids Patient developed a neck mass, pain is a 5 out of 10 that wakes him up at night.  Biopsies revealed:    Nutrition Status Yes No Comments  Weight changes? [x]  []  Patient has gained 15 pounds.   Swallowing concerns? [x]  []  Challenges swallowing solid foods.  PEG? []  []     Referrals Yes No Comments  Social Work? [x]  []    Dentistry? [x]  []    Swallowing therapy? [x]  []    Nutrition? [x]  []    Med/Onc? [x]  []     Safety Issues Yes No Comments  Prior radiation? []  [x]    Pacemaker/ICD? []  [x]    Possible current pregnancy? []  [x]    Is the patient on methotrexate? []  [x]     Tobacco/Marijuana/Snuff/ETOH use:  50 pack year history of Tobacco, Heavy alcohol use, quit 15 years ago.  Past/Anticipated interventions by otolaryngology, if any:  07/31/2024 Tobie, MD   Past/Anticipated interventions by medical oncology, if any:  Radiation Therapy     Current Complaints / other details:   None

## 2024-08-03 ENCOUNTER — Other Ambulatory Visit: Payer: Self-pay | Admitting: Medical Genetics

## 2024-08-05 ENCOUNTER — Encounter (HOSPITAL_COMMUNITY)

## 2024-08-05 NOTE — Progress Notes (Signed)
 Radiation Oncology         [phone] ________________________________  Initial outpatient Consultation  Name: Dave Williams MRN: [id number]  Date: 08/06/2024  DOB: Aug 06, 1956  RR:Jijfd, Geni ORN, FNP  Pasam, Chinita, MD   REFERRING PHYSICIAN: Autumn Chinita, MD  DIAGNOSIS: No diagnosis found.  Invasive squamous cell carcinoma of neck    Cancer Staging  No matching staging information was found for the patient.   CHIEF COMPLAINT: Here to discuss management of neck cancer  HISTORY OF PRESENT ILLNESS::Dave Williams is a 68 y.o. male who presented to his PCP with complains of a recurrent right neck mass.  To further investigate his symptoms, he was referred to Dr. Tobie on 06/26/24. At that time, patient recalled noticing a right neck mass last year that eventually resolved on it's own. However, in January of this year, mass had reoccurred and had increased in size, causing  discomfort in the area.   He then underwent a Transnasal Fiberoptic Laryngoscopy indicating a hypopharynx neck mass involving the pyriform and likely arytenoid submucosally concerning for malignancy. Subsequently, he underwent a right neck mass biopsy on 07/24/24 which revealed invasive squamous cell carcinoma.       Pertinent imaging thus far includes neck CT performed on 07/31/24  showing a 2 cm supraglottic mass to the right of midline consistent with a malignancy along with a bulky malignant lymphadenopathy on the right at level 2 and level 3, with indistinct margins consistent with extra capsular spread. The upper level 2 node shows a diameter of 2.2 x 2.8 cm. The lower level 2 node measures approximately 28 x 17 mm. level 3 lymph node similarly with indistinct margins shows diameter of 21 x 19 mm.   He presented for a follow up with Dr. Tobie on 07/31/24 to discuss further treatment options. Patient expressed no interest in surgical interventions. He would like to consider CRT and radiation.   PET scan  performed on 08/01/24 showed the right eccentric supraglottic mass has a maximum SUV of 6.9 along with hypermetabolic activity in all identified lymph nodes. Scan also noted several scattered pulmonary nodules with the largest measuring 0.4 x 0.5 cm with surveillance recommended as nodules are small in size to detect hypermetabolic activity.    Family history includes his father having esophageal cancer that metastasized to the spine, his mother having skin and anal cancer, and his sister having thyroid  cancer.   Swallowing issues, if any: intermittent dysphagia  Weight Changes: ***  Pain status: ***  Other symptoms: intermittent odynophagia   Tobacco history, if any: extensive smoking history of a pack a day for fifty years. Reports quitting on 03/04/24.   ETOH abuse, if any: reports quitting heavy daily alcohol consumption fifteen years ago after drinking for thirty years.   Prior cancers, if any: None   PREVIOUS RADIATION THERAPY: no ***  PAST MEDICAL HISTORY:  has a past medical history of Anxiety, Chronic left hip pain, Compression fracture of spine (HCC), Coronary artery disease, Hypertension, Pneumonia, Stroke (HCC) (2022), and Substance abuse (HCC).    PAST SURGICAL HISTORY: Past Surgical History:  Procedure Laterality Date   BACK SURGERY     Radiocartography T2-T12   CORONARY ARTERY BYPASS GRAFT N/A 03/04/2024   Procedure: CORONARY ARTERY BYPASS GRAFTING (CABG) TIMES THREE UTILIZING LEFT INTERNAL MAMMARY ARTERY AND ENDOSCOPIC VEIN HARVEST RIGHT GREATER SAPHENOUS VEIN;  Surgeon: Shyrl Linnie KIDD, MD;  Location: [facility] OR;  Service: Open Heart Surgery;  Laterality: N/A;   DIRECT LARYNGOSCOPY N/A 07/24/2024  Procedure: LARYNGOSCOPY, DIRECT;  Surgeon: Tobie Eldora NOVAK, MD;  Location: [facility] OR;  Service: ENT;  Laterality: N/A;  Direct Laryngoscopy with biopsy with use of operating telescope   KNEE ARTHROSCOPY Bilateral    LEFT HEART CATH AND CORONARY ANGIOGRAPHY N/A 11/28/2023    Procedure: LEFT HEART CATH AND CORONARY ANGIOGRAPHY;  Surgeon: Swaziland, Peter M, MD;  Location: [facility] INVASIVE CV LAB;  Service: Cardiovascular;  Laterality: N/A;   PILONIDAL CYST EXCISION     In patients 20s   RIGID ESOPHAGOSCOPY N/A 07/24/2024   Procedure: ESOPHAGOSCOPY, RIGID;  Surgeon: Tobie Eldora NOVAK, MD;  Location: [facility] OR;  Service: ENT;  Laterality: N/A;   TEE WITHOUT CARDIOVERSION N/A 03/04/2024   Procedure: ECHOCARDIOGRAM, TRANSESOPHAGEAL;  Surgeon: Shyrl Linnie KIDD, MD;  Location: [facility] OR;  Service: Open Heart Surgery;  Laterality: N/A;    FAMILY HISTORY: family history includes Cancer in his father and mother; Heart disease in his father; Heart failure in his father and mother; Hypertension in his father and mother.  SOCIAL HISTORY:  reports that he has quit smoking. His smoking use included cigarettes. He has never used smokeless tobacco. He reports that he does not currently use drugs after having used the following drugs: Crack cocaine and Marijuana. He reports that he does not drink alcohol.  ALLERGIES: Patient has no known allergies.  MEDICATIONS:  Current Outpatient Medications  Medication Sig Dispense Refill   acetaminophen  (TYLENOL ) 500 MG tablet Take 1,000 mg by mouth every 6 (six) hours as needed (pain.).     amLODipine  (NORVASC ) 10 MG tablet Take 1 tablet (10 mg total) by mouth daily. 90 tablet 2   aspirin  EC 81 MG tablet Take 1 tablet (81 mg total) by mouth daily. Swallow whole.     atorvastatin  (LIPITOR ) 80 MG tablet Take 1 tablet (80 mg total) by mouth daily with supper. 90 tablet 3   diazepam  (VALIUM ) 2 MG tablet Take 0.5 tablets (1 mg total) by mouth every 6 (six) hours as needed for anxiety. 3 tablet 0   hydrOXYzine  (VISTARIL ) 25 MG capsule Take 1 capsule (25 mg total) by mouth every 8 (eight) hours as needed. 30 capsule 0   isosorbide  mononitrate (IMDUR ) 30 MG 24 hr tablet Take 1 tablet (30 mg total) by mouth daily. 90 tablet 1   nitroGLYCERIN  (NITROSTAT ) 0.4 MG SL  tablet Place 1 tablet (0.4 mg total) under the tongue every 5 (five) minutes as needed for chest pain. DISSOLVE 1 TABLET UNDER THE TONGUE EVERY 5 MINUTES AS NEEDED FOR CHEST PAIN. DO NOT EXCEED A TOTAL OF 3 DOSES IN 15 MINUTES. (Patient not taking: Reported on 07/31/2024) 25 tablet 6   pantoprazole  (PROTONIX ) 40 MG tablet Take 1 tablet (40 mg total) by mouth daily. 30 tablet 0   vitamin D3 (CHOLECALCIFEROL ) 25 MCG tablet Take 1 tablet (1,000 Units total) by mouth daily. 30 tablet 0   No current facility-administered medications for this encounter.    REVIEW OF SYSTEMS:  Notable for that above.   PHYSICAL EXAM:  vitals were not taken for this visit.   General: Alert and oriented, in no acute distress HEENT: Head is normocephalic. Extraocular movements are intact. Oropharynx is notable for ***. Neck: Neck is notable for *** Heart: Regular in rate and rhythm with no murmurs, rubs, or gallops. Chest: Clear to auscultation bilaterally, with no rhonchi, wheezes, or rales. Abdomen: Soft, nontender, nondistended, with no rigidity or guarding. Extremities: No cyanosis or edema. Lymphatics: see Neck Exam Skin: No concerning lesions. Musculoskeletal:  symmetric strength and muscle tone throughout. Neurologic: Cranial nerves II through XII are grossly intact. No obvious focalities. Speech is fluent. Coordination is intact. Psychiatric: Judgment and insight are intact. Affect is appropriate.   ECOG = ***  0 - Asymptomatic (Fully active, able to carry on all predisease activities without restriction)  1 - Symptomatic but completely ambulatory (Restricted in physically strenuous activity but ambulatory and able to carry out work of a light or sedentary nature. For example, light housework, office work)  2 - Symptomatic, <50% in bed during the day (Ambulatory and capable of all self care but unable to carry out any work activities. Up and about more than 50% of waking hours)  3 - Symptomatic, >50% in  bed, but not bedbound (Capable of only limited self-care, confined to bed or chair 50% or more of waking hours)  4 - Bedbound (Completely disabled. Cannot carry on any self-care. Totally confined to bed or chair)  5 - Death   Raylene MM, Creech RH, Tormey DC, et al. [phone]). Toxicity and response criteria of the [facility]. Am. DOROTHA Bridges. Oncol. 5 (6): 649-55   LABORATORY DATA:  Lab Results  Component Value Date   WBC 10.7 (H) 07/24/2024   HGB 13.3 07/24/2024   HCT 41.7 07/24/2024   MCV 90.7 07/24/2024   PLT 258 07/24/2024   CMP     Component Value Date/Time   NA 138 07/24/2024 1304   NA 140 11/27/2023 1439   K 4.2 07/24/2024 1304   CL 106 07/24/2024 1304   CO2 21 (L) 07/24/2024 1304   GLUCOSE 99 07/24/2024 1304   BUN 17 07/24/2024 1304   BUN 21 11/27/2023 1439   CREATININE 1.28 (H) 07/24/2024 1304   CREATININE 1.34 (H) 06/19/2024 1353   CALCIUM  9.2 07/24/2024 1304   PROT 8.3 (H) 06/19/2024 1353   ALBUMIN  4.7 06/19/2024 1353   AST 20 06/19/2024 1353   ALT 27 06/19/2024 1353   ALKPHOS 61 06/19/2024 1353   BILITOT 0.9 06/19/2024 1353   GFRNONAA >60 07/24/2024 1304   GFRNONAA 58 (L) 06/19/2024 1353   GFRAA >60 10/20/2016 1053      Lab Results  Component Value Date   TSH 5.640 (H) 03/10/2024     RADIOGRAPHY: NM PET Image Initial (PI) Skull Base To Thigh Result Date: 08/01/2024 CLINICAL DATA:  Initial treatment strategy for squamous cell carcinoma of the hypopharynx. EXAM: NUCLEAR MEDICINE PET SKULL BASE TO THIGH TECHNIQUE: 12.3 mCi F-18 FDG was injected intravenously. Full-ring PET imaging was performed from the skull base to thigh after the radiotracer. CT data was obtained and used for attenuation correction and anatomic localization. Fasting blood glucose: 125 mg/dl COMPARISON:  CT neck 01/14/7973 FINDINGS: Mediastinal blood pool activity: SUV max 2.1 Liver activity: SUV max NA NECK: The right eccentric supraglottic mass has a maximum SUV of 6.9,  compatible with malignancy. Right level IIa lymph node 2.4 cm in short axis diameter, maximum SUV 8.2 compatible with malignancy. Right level III a lymph node 2.1 cm in short axis on image 53 series 203, maximum SUV 11.3 compatible with malignancy. A left level IIa lymph node 0.8 cm in short axis on image 49 series 203 with maximum SUV 2.6, minimally above blood pool. Incidental CT findings: Mild bilateral common carotid atheromatous vascular calcification. CHEST: No significant abnormal hypermetabolic activity in this region. Incidental CT findings: Emphysema noted best seen at the lung apices. Several scattered small pulmonary nodules are present, the largest 0.4 by 0.5  cm in the right lower lobe on image 43 series 204. These are not hypermetabolic but are technically too small to characterize based on size. Calcified left anterior pleural plaque, image 29 series 204, without hypermetabolic activity. Coronary, aortic arch, and branch vessel atherosclerotic vascular disease. Prior CABG. ABDOMEN/PELVIS: Accentuated activity in the right colon is likely physiologic. Incidental CT findings: Atherosclerosis is present, including aortoiliac atherosclerotic disease. SKELETON: No significant abnormal hypermetabolic activity in this region. Incidental CT findings: None. IMPRESSION: 1. The right eccentric supraglottic mass has a maximum SUV of 6.9, compatible with malignancy. 2. Right level IIA and level III lymph nodes are hypermetabolic, compatible with malignancy. 3. A left level IIA lymph node is minimally above blood pool and not pathologically enlarged, nonspecific. 4. Several scattered small pulmonary nodules are present, the largest 0.4 by 0.5 cm in the right lower lobe. These are not hypermetabolic but are technically too small to characterize based on size. Surveillance recommended. 5. Calcified left anterior pleural plaque, without hypermetabolic activity. 6.  Aortic Atherosclerosis (ICD10-I70.0). Electronically  Signed   By: Ryan Salvage M.D.   On: 08/01/2024 12:12      IMPRESSION/PLAN:  This is a delightful patient with head and neck cancer. I *** recommend radiotherapy for this patient.  We discussed the potential risks, benefits, and side effects of radiotherapy. We talked in detail about acute and late effects. We discussed that some of the most bothersome acute effects may be mucositis, dysgeusia, salivary changes, skin irritation, hair loss, dehydration, weight loss and fatigue. We talked about late effects which include but are not necessarily limited to dysphagia, hypothyroidism, nerve injury, vascular injury, spinal cord injury, xerostomia, trismus, neck edema, dental issues, non-healing wound, and potentially fatal injury to any of the tissues in the head and neck region. No guarantees of treatment were given. A consent form was signed and placed in the patient's medical record. The patient is enthusiastic about proceeding with treatment. I look forward to participating in the patient's care.    Simulation (treatment planning) will take place ***  We also discussed that the treatment of head and neck cancer is a multidisciplinary process to maximize treatment outcomes and quality of life. For this reason the following referrals have been or will be made:  *** Medical oncology to discuss chemotherapy   *** Dentistry for dental evaluation, possible extractions in the radiation fields, and /or advice on reducing risk of cavities, osteoradionecrosis, or other oral issues.  *** Nutritionist for nutrition support during and after treatment.  *** Speech language pathology for swallowing and/or speech therapy.  *** Social work for social support.   *** Physical therapy due to risk of lymphedema in neck and deconditioning.  *** Baseline labs including TSH.  On date of service, in total, I spent *** minutes on this encounter. Patient was seen in  person.  __________________________________________   Lauraine Golden, MD  This document serves as a record of services personally performed by Lauraine Golden, MD. It was created on her behalf by Reymundo Cartwright, a trained medical scribe. The creation of this record is based on the scribe's personal observations and the provider's statements to them. This document has been checked and approved by the attending provider.

## 2024-08-06 ENCOUNTER — Encounter: Payer: Self-pay | Admitting: Radiation Oncology

## 2024-08-06 ENCOUNTER — Ambulatory Visit
Admission: RE | Admit: 2024-08-06 | Discharge: 2024-08-06 | Disposition: A | Discharge status: Home / Self Care | Source: Ambulatory Visit | Attending: Radiation Oncology | Admitting: Radiation Oncology

## 2024-08-06 ENCOUNTER — Ambulatory Visit: Admitting: Emergency Medicine

## 2024-08-06 VITALS — BP 132/85 | HR 70 | Temp 97.2°F | Resp 18 | Ht 73.0 in | Wt 241.0 lb

## 2024-08-06 DIAGNOSIS — J387 Other diseases of larynx: Secondary | ICD-10-CM

## 2024-08-06 DIAGNOSIS — C12 Malignant neoplasm of pyriform sinus: Secondary | ICD-10-CM

## 2024-08-06 DIAGNOSIS — R590 Localized enlarged lymph nodes: Secondary | ICD-10-CM

## 2024-08-06 NOTE — Progress Notes (Signed)
 Oncology Nurse Navigator Documentation   Met with patient during initial consult with [name].  Further introduced myself as his/their Navigator, explained my role as a member of the Care Team. Provided New Patient resource guide binder: Contact information for physicians, this navigator, other members of the Care Team Advance Directive information; provided [facility] AD booklet at their request,  Fall Prevention Patient Safety Plan Financial Assistance Information sheet Symptom Management Clinic information [facility] map with highlight of [facility] SLP Information sheet Head and Neck cancer basics Nutrition information Patient and family support information including Spiritual care/Chaplain information, Peer mentor program, health and wellness classes, and the survivorship program Community resources  Provided and discussed educational handouts for PEG and PAC. Assisted with post-consult appt scheduling. I will place a referral to Dr. Stacie. Hussain with [facility] dental for dental evaluation.   I explained procedures for lobby registration, arrival to Radiation Waiting, and arrival to treatment area.    He verbalized understanding of information provided. I encouraged him to call with questions/concerns moving forward.  [name], RN, BSN, OCN Head & Neck Oncology Nurse Navigator [facility] [place] [phone]

## 2024-08-07 ENCOUNTER — Encounter (HOSPITAL_COMMUNITY)
Admission: RE | Admit: 2024-08-07 | Discharge: 2024-08-07 | Disposition: A | Discharge status: Home / Self Care | Source: Ambulatory Visit | Attending: Cardiovascular Disease

## 2024-08-07 ENCOUNTER — Inpatient Hospital Stay (HOSPITAL_BASED_OUTPATIENT_CLINIC_OR_DEPARTMENT_OTHER): Admitting: Oncology

## 2024-08-07 ENCOUNTER — Encounter: Payer: Self-pay | Admitting: Oncology

## 2024-08-07 ENCOUNTER — Inpatient Hospital Stay

## 2024-08-07 VITALS — BP 130/90 | HR 70 | Temp 97.4°F | Resp 18 | Ht 73.0 in | Wt 239.4 lb

## 2024-08-07 DIAGNOSIS — C12 Malignant neoplasm of pyriform sinus: Secondary | ICD-10-CM | POA: Insufficient documentation

## 2024-08-07 DIAGNOSIS — Z79899 Other long term (current) drug therapy: Secondary | ICD-10-CM | POA: Insufficient documentation

## 2024-08-07 DIAGNOSIS — Z800 Family history of malignant neoplasm of digestive organs: Secondary | ICD-10-CM | POA: Insufficient documentation

## 2024-08-07 DIAGNOSIS — Z951 Presence of aortocoronary bypass graft: Secondary | ICD-10-CM | POA: Insufficient documentation

## 2024-08-07 DIAGNOSIS — Z87891 Personal history of nicotine dependence: Secondary | ICD-10-CM | POA: Insufficient documentation

## 2024-08-07 DIAGNOSIS — G893 Neoplasm related pain (acute) (chronic): Secondary | ICD-10-CM | POA: Insufficient documentation

## 2024-08-07 DIAGNOSIS — Z808 Family history of malignant neoplasm of other organs or systems: Secondary | ICD-10-CM | POA: Insufficient documentation

## 2024-08-07 DIAGNOSIS — Z48812 Encounter for surgical aftercare following surgery on the circulatory system: Secondary | ICD-10-CM | POA: Diagnosis not present

## 2024-08-07 DIAGNOSIS — I2510 Atherosclerotic heart disease of native coronary artery without angina pectoris: Secondary | ICD-10-CM | POA: Insufficient documentation

## 2024-08-07 DIAGNOSIS — Z7982 Long term (current) use of aspirin: Secondary | ICD-10-CM | POA: Insufficient documentation

## 2024-08-07 DIAGNOSIS — I480 Paroxysmal atrial fibrillation: Secondary | ICD-10-CM | POA: Insufficient documentation

## 2024-08-07 DIAGNOSIS — Z7901 Long term (current) use of anticoagulants: Secondary | ICD-10-CM | POA: Insufficient documentation

## 2024-08-07 DIAGNOSIS — I10 Essential (primary) hypertension: Secondary | ICD-10-CM | POA: Insufficient documentation

## 2024-08-07 LAB — CMP (CANCER CENTER ONLY)
ALT: 14 U/L (ref 0–44)
AST: 16 U/L (ref 15–41)
Albumin: 4.6 g/dL (ref 3.5–5.0)
Alkaline Phosphatase: 49 U/L (ref 38–126)
Anion gap: 6 (ref 5–15)
BUN: 14 mg/dL (ref 8–23)
CO2: 30 mmol/L (ref 22–32)
Calcium: 9.9 mg/dL (ref 8.9–10.3)
Chloride: 103 mmol/L (ref 98–111)
Creatinine: 1.31 mg/dL — ABNORMAL HIGH (ref 0.61–1.24)
GFR, Estimated: >60 mL/min (ref >60)
Glucose, Bld: 93 mg/dL (ref 70–99)
Potassium: 4.3 mmol/L (ref 3.5–5.1)
Sodium: 139 mmol/L (ref 135–145)
Total Bilirubin: 1.1 mg/dL (ref 0.0–1.2)
Total Protein: 7.8 g/dL (ref 6.5–8.1)

## 2024-08-07 LAB — CBC WITH DIFFERENTIAL (CANCER CENTER ONLY)
Abs Immature Granulocytes: 0.04 K/uL (ref 0.00–0.07)
Basophils Absolute: 0 K/uL (ref 0.0–0.1)
Basophils Relative: 0 %
Eosinophils Absolute: 0.2 K/uL (ref 0.0–0.5)
Eosinophils Relative: 2 %
HCT: 40.5 % (ref 39.0–52.0)
Hemoglobin: 13.4 g/dL (ref 13.0–17.0)
Immature Granulocytes: 0 %
Lymphocytes Relative: 31 %
Lymphs Abs: 2.9 K/uL (ref 0.7–4.0)
MCH: 29.3 pg (ref 26.0–34.0)
MCHC: 33.1 g/dL (ref 30.0–36.0)
MCV: 88.4 fL (ref 80.0–100.0)
Monocytes Absolute: 0.7 K/uL (ref 0.1–1.0)
Monocytes Relative: 7 %
Neutro Abs: 5.6 K/uL (ref 1.7–7.7)
Neutrophils Relative %: 60 %
Platelet Count: 270 K/uL (ref 150–400)
RBC: 4.58 MIL/uL (ref 4.22–5.81)
RDW: 15.1 % (ref 11.5–15.5)
WBC Count: 9.4 K/uL (ref 4.0–10.5)
nRBC: 0 % (ref 0.0–0.2)

## 2024-08-07 LAB — TSH: TSH: 2.22 u[IU]/mL (ref 0.350–4.500)

## 2024-08-07 MED ORDER — HYDROCODONE-ACETAMINOPHEN 7.5-325 MG PO TABS: 1.0000 | ORAL_TABLET | Freq: Four times a day (QID) | ORAL | 0 refills | Status: DC | PRN

## 2024-08-07 NOTE — Progress Notes (Unsigned)
 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  ASSESSMENT & PLAN:   Dave Williams is a 68 y.o. 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  No problem-specific Assessment & Plan notes found for this encounter.   Assessment and Plan Assessment & Plan Stage 4 head and neck cancer with lymph node involvement Stage 4 head and neck cancer with lymph node involvement, classified as stage 4 due to bilateral lymph node involvement. The treatment goal is curative, as there is no evidence of metastasis beyond the head and neck region. The cancer has a high risk of recurrence, necessitating aggressive treatment. - Administer concurrent chemotherapy and radiation therapy. - Use cisplatin as the preferred chemotherapy agent, contingent on adequate kidney function due to nephrotoxicity. - Consider alternative chemotherapy with carboplatin and paclitaxel if kidney function does not permit cisplatin use. - Administer cisplatin chemotherapy weekly, contingent on kidney function. - Administer radiation  therapy Monday to Friday for seven weeks. - Arrange for port-a-cath placement. - Conduct formal chemotherapy education session. - Check blood work today to assess kidney function, thyroid  function, and blood counts. - Encourage hydration to maintain kidney function, especially if not using a feeding tube.  Neoplasm-related pain due to head and neck cancer Neoplasm-related pain rated as 5 out of 10 on the pain scale. Previous prescriptions included hydrocodone  and oxycodone , with oxycodone  providing temporary relief. He reports better pain control when combining pain medication with Tylenol . - Prescribe hydrocodone  7.5 mg/325 mg for pain management, 60 tablets.  Monitoring of kidney function during chemotherapy Monitoring of kidney function is critical due to the planned use of cisplatin, which is nephrotoxic. Previous creatinine levels have been slightly elevated, with a recent value of 1.28 mg/dL. - Check kidney function as part of today's blood work. - Encourage increased fluid intake to maintain kidney function.      I reviewed lab results and outside records for this visit and discussed relevant results with the patient. Diagnosis, plan of care and treatment options were also discussed in detail with the patient. Opportunity provided to ask questions and answers provided to his apparent satisfaction. Provided instructions to call our clinic with any problems, questions or concerns prior to return visit. I recommended to continue follow-up with PCP and sub-specialists. He verbalized understanding and agreed with the plan. No barriers to learning was detected.  NCCN guidelines have been consulted in the planning of this patient's care.  Millman Autumn, MD  08/07/2024 4:55 PM   [facility] - A DEPT OF [facility] [address] [place] [address] Dept: [phone] Dept Fax: [phone]  HISTORY OF PRESENTING ILLNESS:    I have reviewed his chart and materials related to his cancer extensively and collaborated history with the patient. Summary of oncologic history is as follows:  ONCOLOGY HISTORY:  He presented to his PCP with complains of a recurrent right neck mass.  Patient had US  neck on 06/05/24 showing enlarged lymph nodes in right superior cervical chain measuring 5.1 x 2.1 x 3.0 cm and 2.5 x 1.4 x 1.6 cm. CT soft tissue neck was performed on 06/16/24 showed a 2 cm supraglottic mass to the right of midline consistent with a malignancy and bulky malignant lymphadenopathy on the right at level 2 and level 3, with indistinct margins consistent with extra capsular spread. The upper level 2 node shows a diameter of 2.2 x 2.8 cm. The lower level 2 node measures approximately 28 x 17 mm. Level 3 lymph node similarly with indistinct margins shows diameter of 21 x 19 mm.   His PCP referred him to our clinic for further evaluation of neck lymphadenopathy.  He was seen in our rapid diagnostic clinic on 06/19/2024.  CBCD was unremarkable.  Flow cytometry of peripheral blood was also unremarkable.  ESR, CRP, LDH were within normal limits.  HIV, hepatitis panel testing was negative.   To further investigate his symptoms, he was referred to Dr. Tobie on 06/26/24. At that time, patient recalled noticing a right neck mass last year that eventually resolved on its own. However, in January 2025, mass had reoccurred and had increased in size, causing  discomfort in the area.    He then underwent a Transnasal Fiberoptic Laryngoscopy indicating a hypopharynx neck mass involving the pyriform and likely arytenoid submucosally concerning for malignancy. Vocal folds were mobile bilaterally. No lesions on the free edge of the vocal folds. No airway compromise concern   Subsequently, he underwent a right neck mass biopsy on 07/24/24 which revealed invasive squamous cell carcinoma.         He presented for a follow up with Dr. Tobie on  07/31/24 to discuss further treatment options. Patient expressed no interest in surgical interventions. He would like to consider chemoradiation.    PET scan performed on 08/01/24 showed the right eccentric supraglottic mass has a maximum SUV of 6.9 along with hypermetabolic activity in all identified lymph nodes. Scan also noted several scattered pulmonary nodules with the largest measuring 0.4 x 0.5 cm with surveillance recommended as nodules are small in size to detect hypermetabolic activity.     Family history includes his father having esophageal cancer that metastasized to the spine, his mother having skin and anal cancer, and his sister having thyroid  cancer.    He had consultation with Dr. Izell on 08/06/2024.  Plan made for 7 weeks of radiation therapy and he was referred to us  for concurrent chemotherapy.  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  Patient declined surgical option.  Hence plan made to proceed with concurrent chemoradiation using weekly cisplatin.  Renal function is fluctuating and this needs to be monitored closely.  Further chart review shows patient established care with GI on 06/10/24 after having a positive cologuard test. It was recommended patient have colonoscopy however he prefers to hold off while undergoing oncology work up. GI will reach out to patient in 2 months to schedule.   Oncology History  Cancer of pyriform sinus (HCC)  08/07/2024 Initial Diagnosis   Cancer of pyriform sinus (HCC)   08/07/2024 Cancer Staging  Staging form: Pharynx - Hypopharynx, AJCC 8th Edition - Clinical stage from 08/07/2024: Stage IVB (cT2, cN3, cM0) - Signed by Izell Domino, MD on 08/07/2024 Stage prefix: Initial diagnosis     INTERVAL HISTORY:  Discussed the use of AI scribe software for clinical note transcription with the patient, who gave verbal consent to proceed.  History of Present Illness Dave Williams is a 68 year old  male with head and neck cancer who presents for chemotherapy and radiation treatment planning.  He has a history of coronary artery disease and underwent a three-vessel coronary artery bypass grafting in March 2025. He experienced symptoms such as pain radiating from the jaw to the shoulder and chest, which led to the diagnosis. No current chest pain is reported, but he mentions occasional high blood pressure and anxiety-related symptoms, for which he uses nitroglycerin .  He has a history of smoking and drinking but quit smoking on March 04, 2024, the day of his bypass surgery. 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  He reports throat pain, rated as 5 out of 10, for which he was previously prescribed hydrocodone  and oxycodone . He finds that combining these with Tylenol  provides better pain relief. He experiences occasional difficulty swallowing, especially in the morning due to mouth breathing during sleep, which causes dryness and soreness.  His father had esophageal cancer, which influences his decision-making regarding treatment options. He is determined to maintain nutrition and hydration without a feeding tube, using methods like drinking Boost and making smoothies.    ***  MEDICAL HISTORY:  Past Medical History:  Diagnosis Date   Anxiety    Chronic left hip pain    Compression fracture of spine (HCC)    multiple compression Fx with disc bulge   Coronary artery disease    Hypertension    Pneumonia    As a teenager   Stroke [facility]) 2022   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    SURGICAL HISTORY: Past Surgical History:  Procedure Laterality Date   BACK SURGERY     Radiocartography T2-T12   CORONARY ARTERY BYPASS GRAFT N/A 03/04/2024   Procedure: CORONARY ARTERY BYPASS GRAFTING (CABG) TIMES THREE UTILIZING LEFT INTERNAL MAMMARY  ARTERY AND ENDOSCOPIC VEIN HARVEST RIGHT GREATER SAPHENOUS VEIN;  Surgeon: Shyrl Linnie KIDD, MD;  Location: [facility] OR;  Service: Open Heart Surgery;  Laterality: N/A;   DIRECT LARYNGOSCOPY N/A 07/24/2024   Procedure: LARYNGOSCOPY, DIRECT;  Surgeon: Tobie Eldora NOVAK, MD;  Location: [facility] OR;  Service: ENT;  Laterality: N/A;  Direct Laryngoscopy with biopsy with use of operating telescope   KNEE ARTHROSCOPY Bilateral    LEFT HEART CATH AND CORONARY ANGIOGRAPHY N/A 11/28/2023   Procedure: LEFT HEART CATH AND CORONARY ANGIOGRAPHY;  Surgeon: Swaziland, Peter M, MD;  Location: [facility] INVASIVE CV LAB;  Service: Cardiovascular;  Laterality: N/A;   PILONIDAL CYST EXCISION     In patients 20s   RIGID ESOPHAGOSCOPY N/A 07/24/2024   Procedure: ESOPHAGOSCOPY, RIGID;  Surgeon: Tobie Eldora NOVAK, MD;  Location: [facility] OR;  Service: ENT;  Laterality: N/A;   TEE WITHOUT CARDIOVERSION N/A 03/04/2024   Procedure: ECHOCARDIOGRAM, TRANSESOPHAGEAL;  Surgeon: Shyrl Linnie KIDD, MD;  Location: [facility] OR;  Service: Open Heart Surgery;  Laterality: N/A;    SOCIAL HISTORY: He reports that he has quit smoking. His smoking use included cigarettes.  He has never used smokeless tobacco. He reports that he does not currently use drugs after having used the following drugs: Crack cocaine and Marijuana. He reports that he does not drink alcohol. Social History   Socioeconomic History   Marital status: Single    Spouse name: Not on file   Number of children: Not on file   Years of education: Not on file   Highest education level: Not on file  Occupational History   Not on file  Tobacco Use   Smoking status: Former    Current packs/day: 0.50    Types: Cigarettes   Smokeless tobacco: Never  Vaping Use   Vaping status: Never Used  Substance and Sexual Activity   Alcohol use: No    Comment: Sober for 20 yeras as of 02/2024   Drug use: Not Currently    Types: Crack cocaine, Marijuana    Comment: Used for 30+ years but quit in 1990s and  has not picked up since   Sexual activity: Yes  Other Topics Concern   Not on file  Social History Narrative   Not on file   Social Drivers of Health   Financial Resource Strain: Not on file  Food Insecurity: No Food Insecurity (08/07/2024)   Hunger Vital Sign    Worried About Running Out of Food in the Last Year: Never true    Ran Out of Food in the Last Year: Never true  Transportation Needs: No Transportation Needs (08/07/2024)   PRAPARE - Administrator, Civil Service (Medical): No    Lack of Transportation (Non-Medical): No  Physical Activity: Not on file  Stress: Not on file  Social Connections: Moderately Integrated (03/05/2024)   Social Connection and Isolation Panel    Frequency of Communication with Friends and Family: More than three times a week    Frequency of Social Gatherings with Friends and Family: More than three times a week    Attends Religious Services: More than 4 times per year    Active Member of [employer] or Organizations: Yes    Attends Banker Meetings: 1 to 4 times per year    Marital Status: Divorced  Catering manager Violence: Not At Risk (03/05/2024)   Humiliation, Afraid, Rape, and Kick questionnaire    Fear of Current or Ex-Partner: No    Emotionally Abused: No    Physically Abused: No    Sexually Abused: No    FAMILY HISTORY: Family History  Problem Relation Age of Onset   Cancer Mother    Hypertension Mother    Heart failure Mother    Throat cancer Father        Throat cancer mets to spine   Hypertension Father    Heart failure Father    Heart disease Father    Thyroid  cancer Sister    Skin cancer Maternal Aunt     ALLERGIES:  He has no known allergies.  MEDICATIONS:  Current Outpatient Medications  Medication Sig Dispense Refill   acetaminophen  (TYLENOL ) 500 MG tablet Take 1,000 mg by mouth every 6 (six) hours as needed (pain.).     amLODipine  (NORVASC ) 10 MG tablet Take 1 tablet (10 mg total) by mouth daily.  90 tablet 2   aspirin  EC 81 MG tablet Take 1 tablet (81 mg total) by mouth daily. Swallow whole.     atorvastatin  (LIPITOR ) 80 MG tablet Take 1 tablet (80 mg total) by mouth daily with supper. 90 tablet 3   HYDROcodone -acetaminophen  (NORCO)  7.5-325 MG tablet Take 1 tablet by mouth every 6 (six) hours as needed for severe pain (pain score 7-10). 60 tablet 0   hydrOXYzine  (VISTARIL ) 25 MG capsule Take 1 capsule (25 mg total) by mouth every 8 (eight) hours as needed. 30 capsule 0   isosorbide  mononitrate (IMDUR ) 30 MG 24 hr tablet Take 1 tablet (30 mg total) by mouth daily. 90 tablet 1   nitroGLYCERIN  (NITROSTAT ) 0.4 MG SL tablet Place 1 tablet (0.4 mg total) under the tongue every 5 (five) minutes as needed for chest pain. DISSOLVE 1 TABLET UNDER THE TONGUE EVERY 5 MINUTES AS NEEDED FOR CHEST PAIN. DO NOT EXCEED A TOTAL OF 3 DOSES IN 15 MINUTES. 25 tablet 6   pantoprazole  (PROTONIX ) 40 MG tablet Take 1 tablet (40 mg total) by mouth daily. 30 tablet 0   vitamin D3 (CHOLECALCIFEROL ) 25 MCG tablet Take 1 tablet (1,000 Units total) by mouth daily. 30 tablet 0   No current facility-administered medications for this visit.    REVIEW OF SYSTEMS:    Review of Systems - Oncology  All other pertinent systems were reviewed with the patient and are negative.  PHYSICAL EXAMINATION:   Onc Performance Status - 08/07/24 1333       ECOG Perf Status   ECOG Perf Status Fully active, able to carry on all pre-disease performance without restriction      KPS SCALE   KPS % SCORE Normal, no compliants, no evidence of disease          Vitals:   08/07/24 1326  BP: (!) 130/90  Pulse: 70  Resp: 18  Temp: (!) 97.4 F (36.3 C)  SpO2: 98%   Filed Weights   08/07/24 1326  Weight: 239 lb 6.4 oz (108.6 kg)    Physical Exam  ***  LABORATORY DATA:   I have reviewed the data as listed.  Results for orders placed or performed in visit on 08/07/24  CMP (Cancer Center only)  Result Value Ref Range    Sodium 139 135 - 145 mmol/L   Potassium 4.3 3.5 - 5.1 mmol/L   Chloride 103 98 - 111 mmol/L   CO2 30 22 - 32 mmol/L   Glucose, Bld 93 70 - 99 mg/dL   BUN 14 8 - 23 mg/dL   Creatinine 8.68 (H) 9.38 - 1.24 mg/dL   Calcium  9.9 8.9 - 10.3 mg/dL   Total Protein 7.8 6.5 - 8.1 g/dL   Albumin  4.6 3.5 - 5.0 g/dL   AST 16 15 - 41 U/L   ALT 14 0 - 44 U/L   Alkaline Phosphatase 49 38 - 126 U/L   Total Bilirubin 1.1 0.0 - 1.2 mg/dL   GFR, Estimated >39 >39 mL/min   Anion gap 6 5 - 15  CBC with Differential (Cancer Center Only)  Result Value Ref Range   WBC Count 9.4 4.0 - 10.5 K/uL   RBC 4.58 4.22 - 5.81 MIL/uL   Hemoglobin 13.4 13.0 - 17.0 g/dL   HCT 59.4 60.9 - 47.9 %   MCV 88.4 80.0 - 100.0 fL   MCH 29.3 26.0 - 34.0 pg   MCHC 33.1 30.0 - 36.0 g/dL   RDW 84.8 88.4 - 84.4 %   Platelet Count 270 150 - 400 K/uL   nRBC 0.0 0.0 - 0.2 %   Neutrophils Relative % 60 %   Neutro Abs 5.6 1.7 - 7.7 K/uL   Lymphocytes Relative 31 %   Lymphs Abs 2.9 0.7 - 4.0  K/uL   Monocytes Relative 7 %   Monocytes Absolute 0.7 0.1 - 1.0 K/uL   Eosinophils Relative 2 %   Eosinophils Absolute 0.2 0.0 - 0.5 K/uL   Basophils Relative 0 %   Basophils Absolute 0.0 0.0 - 0.1 K/uL   Immature Granulocytes 0 %   Abs Immature Granulocytes 0.04 0.00 - 0.07 K/uL    Lab Results  Component Value Date   WBC 9.4 08/07/2024   HGB 13.4 08/07/2024   HCT 40.5 08/07/2024   MCV 88.4 08/07/2024   PLT 270 08/07/2024   Recent Labs    03/14/24 0624 03/18/24 0541 06/19/24 1353 07/24/24 1304 08/07/24 1440  NA 133*   < > 137 138 139  K 4.3   < > 4.2 4.2 4.3  CL 102   < > 102 106 103  CO2 22   < > 27 21* 30  GLUCOSE 112*   < > 101* 99 93  BUN 17   < > 19 17 14   CREATININE 1.14   < > 1.34* 1.28* 1.31*  CALCIUM  8.9   < > 10.1 9.2 9.9  GFRNONAA >60   < > 58* >60 >60  PROT 6.9  --  8.3*  --  7.8  ALBUMIN  3.0*  --  4.7  --  4.6  AST 28  --  20  --  16  ALT 33  --  27  --  14  ALKPHOS 49  --  61  --  49  BILITOT 1.1  --   0.9  --  1.1   < > = values in this interval not displayed.     RADIOGRAPHIC STUDIES:  I have personally reviewed the radiological images as listed and agree with the findings in the report.  NM PET Image Initial (PI) Skull Base To Thigh Result Date: 08/01/2024 CLINICAL DATA:  Initial treatment strategy for squamous cell carcinoma of the hypopharynx. EXAM: NUCLEAR MEDICINE PET SKULL BASE TO THIGH TECHNIQUE: 12.3 mCi F-18 FDG was injected intravenously. Full-ring PET imaging was performed from the skull base to thigh after the radiotracer. CT data was obtained and used for attenuation correction and anatomic localization. Fasting blood glucose: 125 mg/dl COMPARISON:  CT neck 01/14/7973 FINDINGS: Mediastinal blood pool activity: SUV max 2.1 Liver activity: SUV max NA NECK: The right eccentric supraglottic mass has a maximum SUV of 6.9, compatible with malignancy. Right level IIa lymph node 2.4 cm in short axis diameter, maximum SUV 8.2 compatible with malignancy. Right level III a lymph node 2.1 cm in short axis on image 53 series 203, maximum SUV 11.3 compatible with malignancy. A left level IIa lymph node 0.8 cm in short axis on image 49 series 203 with maximum SUV 2.6, minimally above blood pool. Incidental CT findings: Mild bilateral common carotid atheromatous vascular calcification. CHEST: No significant abnormal hypermetabolic activity in this region. Incidental CT findings: Emphysema noted best seen at the lung apices. Several scattered small pulmonary nodules are present, the largest 0.4 by 0.5 cm in the right lower lobe on image 43 series 204. These are not hypermetabolic but are technically too small to characterize based on size. Calcified left anterior pleural plaque, image 29 series 204, without hypermetabolic activity. Coronary, aortic arch, and branch vessel atherosclerotic vascular disease. Prior CABG. ABDOMEN/PELVIS: Accentuated activity in the right colon is likely physiologic. Incidental CT  findings: Atherosclerosis is present, including aortoiliac atherosclerotic disease. SKELETON: No significant abnormal hypermetabolic activity in this region. Incidental CT findings: None. IMPRESSION: 1.  The right eccentric supraglottic mass has a maximum SUV of 6.9, compatible with malignancy. 2. Right level IIA and level III lymph nodes are hypermetabolic, compatible with malignancy. 3. A left level IIA lymph node is minimally above blood pool and not pathologically enlarged, nonspecific. 4. Several scattered small pulmonary nodules are present, the largest 0.4 by 0.5 cm in the right lower lobe. These are not hypermetabolic but are technically too small to characterize based on size. Surveillance recommended. 5. Calcified left anterior pleural plaque, without hypermetabolic activity. 6.  Aortic Atherosclerosis (ICD10-I70.0). Electronically Signed   By: Ryan Salvage M.D.   On: 08/01/2024 12:12    Orders Placed This Encounter  Procedures   CBC with Differential (Cancer Center Only)    Standing Status:   Future    Number of Occurrences:   1    Expiration Date:   08/07/2025   CMP (Cancer Center only)    Standing Status:   Future    Number of Occurrences:   1    Expiration Date:   08/07/2025   TSH    Standing Status:   Future    Number of Occurrences:   1    Expiration Date:   08/07/2025    CODE STATUS:  Code Status History     Date Active Date Inactive Code Status Order ID Comments User Context   03/13/2024 1550 03/21/2024 1532 Full Code [id number]  Maurice Sharlet GORMAN DEVONNA Inpatient   03/04/2024 1247 03/13/2024 1548 Full Code [id number]  Viviane Lemond FORBES DEVONNA Inpatient   11/28/2023 1625 11/28/2023 2356 Full Code [id number]  [place], Peter M, MD Inpatient    Questions for Most Recent Historical Code Status (Order [id number])     Question Answer   By: Consent: discussion documented in EHR            Future Appointments  Date Time Provider Department Center  08/14/2024  9:15 AM [facility]  PROGRAM SESSION [facility]  08/19/2024  9:15 AM [facility] PROGRAM SESSION [facility]  08/21/2024  9:00 AM Brenna Lemond, MD OCR-OCR None  08/21/2024  9:15 AM [facility] PROGRAM SESSION [facility]  08/23/2024  9:15 AM [facility] PROGRAM SESSION [facility]  08/26/2024  9:15 AM [facility] PROGRAM SESSION [facility]  08/28/2024  9:15 AM [facility] PROGRAM SESSION [facility]  08/30/2024  9:15 AM [facility] PROGRAM SESSION [facility]  09/02/2024  9:15 AM [facility] PROGRAM SESSION [facility]  09/04/2024  9:15 AM [facility] PROGRAM SESSION [facility]  09/06/2024  9:15 AM AP[facility]CAR[facility] REHAB PROGRAM SESSION [facility]  09/09/2024  9:15 AM [facility] REHAB PROGRAM SESSION [facility]  09/11/2024  9:15 AM [facility] PROGRAM SESSION [facility]  09/18/2024  9:15 AM [facility] PROGRAM SESSION [facility]     I spent a total of 70 minutes during this encounter with the patient including review of chart and various tests results, discussions about plan of care and coordination of care plan.  This document was completed utilizing speech recognition software. Grammatical errors, random word insertions, pronoun errors, and incomplete sentences are an occasional consequence of this system due to software limitations, ambient noise, and hardware issues. Any formal questions or concerns about the content, text or information contained within the body of this dictation should be directly addressed to the provider for clarification.

## 2024-08-07 NOTE — Progress Notes (Signed)
 Oncology Nurse Navigator Documentation   Met with patient during initial consult with [name].  Further introduced myself as his Navigator, explained my role as a member of the Care Team. Assisted with post-consult appt scheduling. He knows to expect a call from Dr. Nila office for dental evaluation.  They verbalized understanding of information provided. I encouraged him to call with questions/concerns moving forward.  [name], RN, BSN, OCN Head & Neck Oncology Nurse Navigator [facility] [phone]

## 2024-08-07 NOTE — Progress Notes (Addendum)
 Daily Session Note  Patient Details  Name: Dave Williams MRN: [id number] Date of Birth: [birth year] Referring Provider:   Flowsheet Row CARDIAC REHAB PHASE II ORIENTATION from 05/20/2024 in [facility] CARDIAC REHABILITATION  Referring Provider Court Carrier MD    Encounter Date: 08/07/2024  Check In:  Session Check In - 08/07/24 0915       Check-In   Supervising physician immediately available to respond to emergencies See telemetry face sheet for immediately available MD    Location [facility]    Staff Present Powell Benders, BS, Exercise Physiologist;Jessica Vonzell, MA, RCEP, CCRP, CCET    Virtual Visit No    Medication changes reported     No    Fall or balance concerns reported    No    Tobacco Cessation No Change    Warm-up and Cool-down Performed on first and last piece of equipment    Resistance Training Performed Yes    VAD Patient? No    PAD/SET Patient? No      Pain Assessment   Currently in Pain? No/denies    Pain Score 0-No pain    Multiple Pain Sites No          Capillary Blood Glucose: No results found for this or any previous visit (from the past 24 hours).    Social History   Tobacco Use  Smoking Status Former   Current packs/day: 0.50   Types: Cigarettes  Smokeless Tobacco Never    Goals Met:  Independence with exercise equipment Exercise tolerated well No report of concerns or symptoms today Strength training completed today  Goals Unmet:  Not Applicable  Comments: Pt able to follow exercise prescription today without complaint.  Will continue to monitor for progression.

## 2024-08-07 NOTE — Progress Notes (Signed)
 Dental Form with Estimates of Radiation Dose  [name] Date of birth: 09-28-1956     Diagnosis:    ICD-10-CM   1. Cervical lymphadenopathy  R59.0     2. 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 Cancer of pyriform sinus (HCC)  C12       Cancer Staging  Cancer of pyriform sinus (HCC) Staging form: Pharynx - Hypopharynx, AJCC 8th Edition - Clinical stage from 08/07/2024: Stage IVB (cT2, cN3, cM0) - Signed by Izell Domino, MD on 08/07/2024 Stage prefix: Initial diagnosis   Prognosis: curative  Anticipated # of fractions: 35    Daily?: yes  # of weeks of radiotherapy: 7  Chemotherapy?: likely  Anticipated xerostomia:  Mild permanent    Pre-simulation needs:  full extractions  Simulation: ASAP    Other Notes:   Please contact Domino Izell, MD, with patient's disposition after evaluation and/or dental treatment. -----------------------------------  Domino Izell, MD

## 2024-08-08 ENCOUNTER — Encounter: Payer: Self-pay | Admitting: Oncology

## 2024-08-08 DIAGNOSIS — G893 Neoplasm related pain (acute) (chronic): Secondary | ICD-10-CM | POA: Insufficient documentation

## 2024-08-08 NOTE — Assessment & Plan Note (Signed)
 Please review oncology history for additional details and timeline of events.  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  Patient declined surgical option.  Hence plan made to proceed with concurrent chemoradiation.  Renal function is fluctuating and this needs to be monitored closely.   The cancer has a high risk of recurrence, necessitating aggressive treatment.  We have discussed about role of cisplatin being a radiosensitizer in the treatment of head and neck cancer.  We have discussed about the curative intent of chemoradiation for this patient.     We have discussed about mechanism of action of cisplatin, adverse effects of cisplatin including but not limited to fatigue, nausea, vomiting, increased risk of infections, mucositis, ototoxicity, nephrotoxicity, peripheral neuropathy.  Patient understands that some of the side effects can be permanent and even potentially fatal.  We have discussed about role of Mediport and G-tube for chemotherapy administration and nutrition respectively since most of these patients have severe mucositis during the treatment.  At this time we do not know if weekly cisplatin is inferior to every 21 days cisplatin since there is no head-to-head comparison trial. I did mention to the patient however weekly cisplatin is well-tolerated with less adverse effects and most of the patients tend to complete treatment as planned.  Patient is willing to proceed with weekly cisplatin.  - Use cisplatin as the preferred chemotherapy agent, contingent on adequate kidney function due to nephrotoxicity.  Other options include weekly docetaxel or weekly carboplatin and paclitaxel, if kidney function does not permit cisplatin use.  - Arrange for port-a-cath placement.  Patient declined feeding tube placement at this time.  - Conduct formal chemotherapy education session. - Check blood work today to assess kidney function, thyroid  function,  and blood counts.  - Encourage hydration to maintain kidney function, especially if not using a feeding tube.  Appropriate referrals will be made for supportive care including physical therapy, speech pathology, nutrition.  Plan is to begin him on chemotherapy during the same week that he begins radiation treatments, pending healing from dental extractions.  Tentatively in the last week of September 2025.

## 2024-08-08 NOTE — Assessment & Plan Note (Signed)
 Neoplasm-related pain rated as 5 out of 10 on the pain scale. Previous prescriptions included hydrocodone  and oxycodone , with oxycodone  providing temporary relief. He reports better pain control when combining pain medication with Tylenol . - Prescribe hydrocodone  7.5 mg/325 mg for pain management, 60 tablets.

## 2024-08-09 ENCOUNTER — Encounter (HOSPITAL_COMMUNITY)

## 2024-08-09 ENCOUNTER — Encounter (HOSPITAL_COMMUNITY)
Admission: RE | Admit: 2024-08-09 | Discharge: 2024-08-09 | Disposition: A | Discharge status: Home / Self Care | Source: Ambulatory Visit | Attending: Cardiovascular Disease | Admitting: Cardiovascular Disease

## 2024-08-09 DIAGNOSIS — Z951 Presence of aortocoronary bypass graft: Secondary | ICD-10-CM

## 2024-08-09 DIAGNOSIS — Z48812 Encounter for surgical aftercare following surgery on the circulatory system: Secondary | ICD-10-CM | POA: Diagnosis not present

## 2024-08-09 NOTE — Progress Notes (Signed)
 Daily Session Note  Patient Details  Name: Dave Williams MRN: [id number] Date of Birth: August 01, 1956 Referring Provider:   Flowsheet Row CARDIAC REHAB PHASE II ORIENTATION from 05/20/2024 in [facility] CARDIAC REHABILITATION  Referring Provider Court Carrier MD    Encounter Date: 08/09/2024  Check In:  Session Check In - 08/09/24 0933       Check-In   Supervising physician immediately available to respond to emergencies See telemetry face sheet for immediately available MD    Location [facility]    Staff Present Ronal Idell Glen, RN, BSN, Randie Gelineau, MA, RCEP, CCRP, CCET;Rachel [place], RN, BSN    Virtual Visit No    Medication changes reported     No    Fall or balance concerns reported    No    Warm-up and Cool-down Performed on first and last piece of equipment    Resistance Training Performed Yes    VAD Patient? No    PAD/SET Patient? No      Pain Assessment   Currently in Pain? No/denies          Capillary Blood Glucose: No results found for this or any previous visit (from the past 24 hours).    Social History   Tobacco Use  Smoking Status Former   Current packs/day: 0.50   Types: Cigarettes  Smokeless Tobacco Never    Goals Met:  Independence with exercise equipment Exercise tolerated well No report of concerns or symptoms today Strength training completed today  Goals Unmet:  Not Applicable  Comments: Pt able to follow exercise prescription today without complaint.  Will continue to monitor for progression.

## 2024-08-14 ENCOUNTER — Ambulatory Visit: Admitting: Orthopaedic Surgery

## 2024-08-14 ENCOUNTER — Encounter (HOSPITAL_COMMUNITY)
Admission: RE | Admit: 2024-08-14 | Discharge: 2024-08-14 | Disposition: A | Discharge status: Home / Self Care | Source: Ambulatory Visit | Attending: Cardiovascular Disease | Admitting: Cardiovascular Disease

## 2024-08-14 DIAGNOSIS — Z951 Presence of aortocoronary bypass graft: Secondary | ICD-10-CM | POA: Insufficient documentation

## 2024-08-14 NOTE — Progress Notes (Signed)
 Daily Session Note  Patient Details  Name: Dave Williams MRN: [id number] Date of Birth: 1956/07/05 Referring Provider:   Flowsheet Row CARDIAC REHAB PHASE II ORIENTATION from 05/20/2024 in [facility] CARDIAC REHABILITATION  Referring Provider Court Carrier MD    Encounter Date: 08/14/2024  Check In:  Session Check In - 08/14/24 0932       Check-In   Supervising physician immediately available to respond to emergencies See telemetry face sheet for immediately available MD    Location [facility]    Staff Present Powell Benders, BS, Exercise Physiologist;Brittany Jackquline, BSN, RN, Rosalba Gelineau, MA, RCEP, CCRP, CCET    Virtual Visit No    Medication changes reported     No    Fall or balance concerns reported    No    Tobacco Cessation No Change    Warm-up and Cool-down Performed on first and last piece of equipment    Resistance Training Performed Yes    VAD Patient? No    PAD/SET Patient? No      Pain Assessment   Currently in Pain? No/denies    Pain Score 0-No pain    Multiple Pain Sites No          Capillary Blood Glucose: No results found for this or any previous visit (from the past 24 hours).    Social History   Tobacco Use  Smoking Status Former   Current packs/day: 0.50   Types: Cigarettes  Smokeless Tobacco Never    Goals Met:  Independence with exercise equipment Exercise tolerated well No report of concerns or symptoms today Strength training completed today  Goals Unmet:  Not Applicable  Comments: Pt able to follow exercise prescription today without complaint.  Will continue to monitor for progression.

## 2024-08-16 ENCOUNTER — Encounter (HOSPITAL_COMMUNITY)

## 2024-08-19 ENCOUNTER — Encounter (HOSPITAL_COMMUNITY)
Admission: RE | Admit: 2024-08-19 | Discharge: 2024-08-19 | Disposition: A | Discharge status: Home / Self Care | Source: Ambulatory Visit | Attending: Cardiovascular Disease | Admitting: Cardiovascular Disease

## 2024-08-19 DIAGNOSIS — Z951 Presence of aortocoronary bypass graft: Secondary | ICD-10-CM

## 2024-08-19 NOTE — Progress Notes (Signed)
 Daily Session Note  Patient Details  Name: Dave Williams MRN: [id number] Date of Birth: [birth year] Referring Provider:   Flowsheet Row CARDIAC REHAB PHASE II ORIENTATION from 05/20/2024 in [facility] CARDIAC REHABILITATION  Referring Provider Court Carrier MD    Encounter Date: 08/19/2024  Check In:  Session Check In - 08/19/24 0920       Check-In   Supervising physician immediately available to respond to emergencies See telemetry face sheet for immediately available MD    Location [facility]    Staff Present Laymon Rattler, BSN, RN, Rosalba Gelineau, MA, RCEP, CCRP, CCET    Virtual Visit No    Medication changes reported     No    Fall or balance concerns reported    No    Tobacco Cessation No Change    Warm-up and Cool-down Performed on first and last piece of equipment    Resistance Training Performed Yes    VAD Patient? No    PAD/SET Patient? No      Pain Assessment   Currently in Pain? No/denies          Capillary Blood Glucose: No results found for this or any previous visit (from the past 24 hours).    Social History   Tobacco Use  Smoking Status Former   Current packs/day: 0.50   Types: Cigarettes  Smokeless Tobacco Never    Goals Met:    Goals Unmet:  Not Applicable  Comments: Pt able to follow exercise prescription today without complaint.  Will continue to monitor for progression.

## 2024-08-21 ENCOUNTER — Encounter: Payer: Self-pay | Admitting: Orthopaedic Surgery

## 2024-08-21 ENCOUNTER — Encounter (HOSPITAL_COMMUNITY)
Admission: RE | Admit: 2024-08-21 | Discharge: 2024-08-21 | Disposition: A | Discharge status: Home / Self Care | Source: Ambulatory Visit | Attending: Cardiovascular Disease | Admitting: Cardiovascular Disease

## 2024-08-21 ENCOUNTER — Encounter (HOSPITAL_COMMUNITY): Payer: Self-pay | Admitting: *Deleted

## 2024-08-21 ENCOUNTER — Ambulatory Visit: Admitting: Orthopaedic Surgery

## 2024-08-21 DIAGNOSIS — M5442 Lumbago with sciatica, left side: Secondary | ICD-10-CM

## 2024-08-21 DIAGNOSIS — Z951 Presence of aortocoronary bypass graft: Secondary | ICD-10-CM

## 2024-08-21 DIAGNOSIS — G8929 Other chronic pain: Secondary | ICD-10-CM | POA: Diagnosis not present

## 2024-08-21 NOTE — Progress Notes (Signed)
 I am the same.  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  His prognosis is undetermined.  Dave Williams has a good attitude.  Dave Williams has lower back pain but Dave Williams says that is nothing compared to his neck pain.  Dave Williams has good days and bad days.    Lower back has good ROM, muscle tone and strength normal, NV intact.  Gait is good.  Encounter Diagnosis  Name Primary?   Chronic left-sided low back pain with left-sided sciatica Yes   I have informed the patient I will be retiring from medical practice and from this office on September 12, 2024.  The patient has been offered continuing care with Dr. Margrette or Dr. Onesimo of this office.  The patient may choose another provider and the records will be forwarded after proper signature and notification.  Patient understands and agrees.  Dave Williams prefers to return prn.  Call if any problem.  Precautions discussed.  Electronically Signed Lemond Stable, MD 9/10/20259:25 AM

## 2024-08-21 NOTE — Progress Notes (Signed)
 Daily Session Note  Patient Details  Name: Dave Williams MRN: [id number] Date of Birth: 1955/12/16 Referring Provider:   Flowsheet Row CARDIAC REHAB PHASE II ORIENTATION from 05/20/2024 in [facility] CARDIAC REHABILITATION  Referring Provider Court Carrier MD    Encounter Date: 08/21/2024  Check In:  Session Check In - 08/21/24 0944       Check-In   Supervising physician immediately available to respond to emergencies See telemetry face sheet for immediately available MD    Location [facility]    Staff Present Laymon Rattler, BSN, RN, WTA-C;Heather Con, BS, Exercise Physiologist    Virtual Visit No    Medication changes reported     No    Fall or balance concerns reported    No    Tobacco Cessation No Change    Warm-up and Cool-down Performed on first and last piece of equipment    Resistance Training Performed Yes    VAD Patient? No    PAD/SET Patient? No      Pain Assessment   Currently in Pain? No/denies          Capillary Blood Glucose: No results found for this or any previous visit (from the past 24 hours).    Social History   Tobacco Use  Smoking Status Former   Current packs/day: 0.50   Types: Cigarettes  Smokeless Tobacco Never    Goals Met:  Independence with exercise equipment Exercise tolerated well No report of concerns or symptoms today Strength training completed today  Goals Unmet:  Not Applicable  Comments: Pt able to follow exercise prescription today without complaint.  Will continue to monitor for progression.

## 2024-08-21 NOTE — Progress Notes (Signed)
 Cardiac Individual Treatment Plan  Patient Details  Name: Dave Williams MRN: [id number] Date of Birth: [birth year] Referring Provider:   Flowsheet Row CARDIAC REHAB PHASE II ORIENTATION from 05/20/2024 in [facility] CARDIAC REHABILITATION  Referring Provider Court Carrier MD    Initial Encounter Date:  Flowsheet Row CARDIAC REHAB PHASE II ORIENTATION from 05/20/2024 in [facility] CARDIAC REHABILITATION  Date 05/20/24    Visit Diagnosis: S/P CABG x 3  Patient's Home Medications on Admission:  Current Outpatient Medications:    acetaminophen  (TYLENOL ) 500 MG tablet, Take 1,000 mg by mouth every 6 (six) hours as needed (pain.)., Disp: , Rfl:    amLODipine  (NORVASC ) 10 MG tablet, Take 1 tablet (10 mg total) by mouth daily., Disp: 90 tablet, Rfl: 2   aspirin  EC 81 MG tablet, Take 1 tablet (81 mg total) by mouth daily. Swallow whole., Disp: , Rfl:    atorvastatin  (LIPITOR ) 80 MG tablet, Take 1 tablet (80 mg total) by mouth daily with supper., Disp: 90 tablet, Rfl: 3   HYDROcodone -acetaminophen  (NORCO) 7.5-325 MG tablet, Take 1 tablet by mouth every 6 (six) hours as needed for severe pain (pain score 7-10)., Disp: 60 tablet, Rfl: 0   hydrOXYzine  (VISTARIL ) 25 MG capsule, Take 1 capsule (25 mg total) by mouth every 8 (eight) hours as needed., Disp: 30 capsule, Rfl: 0   isosorbide  mononitrate (IMDUR ) 30 MG 24 hr tablet, Take 1 tablet (30 mg total) by mouth daily., Disp: 90 tablet, Rfl: 1   nitroGLYCERIN  (NITROSTAT ) 0.4 MG SL tablet, Place 1 tablet (0.4 mg total) under the tongue every 5 (five) minutes as needed for chest pain. DISSOLVE 1 TABLET UNDER THE TONGUE EVERY 5 MINUTES AS NEEDED FOR CHEST PAIN. DO NOT EXCEED A TOTAL OF 3 DOSES IN 15 MINUTES., Disp: 25 tablet, Rfl: 6   pantoprazole  (PROTONIX ) 40 MG tablet, Take 1 tablet (40 mg total) by mouth daily., Disp: 30 tablet, Rfl: 0   vitamin D3 (CHOLECALCIFEROL ) 25 MCG tablet, Take 1 tablet (1,000 Units total) by mouth daily., Disp: 30 tablet, Rfl:  0  Past Medical History: Past Medical History:  Diagnosis Date   Anxiety    Chronic left hip pain    Compression fracture of spine (HCC)    multiple compression Fx with disc bulge   Coronary artery disease    Hypertension    Pneumonia    As a teenager   Stroke [facility]) 2022   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    Tobacco Use: Social History   Tobacco Use  Smoking Status Former   Current packs/day: 0.50   Types: Cigarettes  Smokeless Tobacco Never    Labs: Review Flowsheet       Latest Ref Rng & Units 02/29/2024 03/04/2024 03/06/2024  Labs for ITP Cardiac and Pulmonary Rehab  Cholestrol 0 - 200 mg/dL - - 94   LDL (calc) 0 - 99 mg/dL - - 50   HDL-C >59 mg/dL - - 22   Trlycerides <849 mg/dL - - 887   Hemoglobin J8r 4.8 - 5.6 % 5.8  - -  PH, Arterial 7.35 - 7.45 - 7.324  7.306  7.340  7.280  7.271  7.290  7.314  -  PCO2 arterial 32 - 48 mmHg - 41.3  40.1  43.4  32.8  49.2  48.8  51.8  -  Bicarbonate 20.0 - 28.0 mmol/L - 21.1  19.7  23.1  15.4  22.8  23.5  25.5  26.4  -  TCO2 22 - 32 mmol/L - 22  21  24  16  24  23  25  26  27  28  25  25   -  Acid-base deficit 0.0 - 2.0 mmol/L - 4.0  6.0  2.0  10.0  4.0  3.0  1.0  -  O2 Saturation % - 95  97  99  98  98  96  84  100  -    Details       Multiple values from one day are sorted in reverse-chronological order         Capillary Blood Glucose: Lab Results  Component Value Date   GLUCAP 113 (H) 03/08/2024   GLUCAP 121 (H) 03/07/2024   GLUCAP 112 (H) 03/07/2024   GLUCAP 111 (H) 03/07/2024   GLUCAP 136 (H) 03/06/2024     Exercise Target Goals: Exercise Program Goal: Individual exercise prescription set using results from initial 6 min walk test and THRR while considering  patient's activity barriers and safety.   Exercise Prescription Goal: Starting with aerobic activity 30 plus minutes a day, 3 days per week for initial exercise prescription. Provide  home exercise prescription and guidelines that participant acknowledges understanding prior to discharge.  Activity Barriers & Risk Stratification:  Activity Barriers & Cardiac Risk Stratification - 05/16/24 0921       Activity Barriers & Cardiac Risk Stratification   Activity Barriers Back Problems;Chest Pain/Angina;Shortness of Breath   Multiple back sugeries for back.   Cardiac Risk Stratification High          6 Minute Walk:  6 Minute Walk     Row Name 05/20/24 1119         6 Minute Walk   Phase Initial     Distance 1432 feet     Walk Time 6 minutes     # of Rest Breaks 0     MPH 2.71     METS 3.31     RPE 8     VO2 Peak 11.57     Symptoms No     Resting HR 69 bpm     Resting BP 110/70     Resting Oxygen Saturation  95 %     Exercise Oxygen Saturation  during 6 min walk 97 %     Max Ex. HR 99 bpm     Max Ex. BP 136/62     2 Minute Post BP 126/62        Oxygen Initial Assessment:   Oxygen Re-Evaluation:   Oxygen Discharge (Final Oxygen Re-Evaluation):   Initial Exercise Prescription:  Initial Exercise Prescription - 05/20/24 1100       Date of Initial Exercise RX and Referring Provider   Date 05/20/24    Referring Provider Court Carrier MD      Oxygen   Maintain Oxygen Saturation 88% or higher      Treadmill   MPH 2.7    Grade 1    Minutes 15    METs 3.44      REL-XR   Level 13    Speed 50    Minutes 15    METs 3.4      Prescription Details   Frequency (times per week) 1   starting with one to meet OOP   Duration Progress to 30 minutes of continuous aerobic without signs/symptoms of physical distress      Intensity   THRR 40-80%  of Max Heartrate 102-137    Ratings of Perceived Exertion 11-13    Perceived Dyspnea 0-4      Progression   Progression Continue to progress workloads to maintain intensity without signs/symptoms of physical distress.      Resistance Training   Training Prescription Yes    Weight 5 lb    Reps 10-15           Perform Capillary Blood Glucose checks as needed.  Exercise Prescription Changes:   Exercise Prescription Changes     Row Name 05/20/24 1100 06/12/24 1300 07/17/24 1300 07/26/24 0800 07/26/24 0900     Response to Exercise   Blood Pressure (Admit) 110/70 104/62 126/82 124/66 --   Blood Pressure (Exercise) 136/62 144/72 -- -- --   Blood Pressure (Exit) 126/62 120/64 100/60 126/62 --   Heart Rate (Admit) 69 bpm 83 bpm 65 bpm 70 bpm --   Heart Rate (Exercise) 99 bpm 110 bpm 102 bpm 103 bpm --   Heart Rate (Exit) 76 bpm 90 bpm 85 bpm 77 bpm --   Oxygen Saturation (Admit) 95 % -- -- -- --   Oxygen Saturation (Exercise) 97 % -- -- -- --   Rating of Perceived Exertion (Exercise) 8 13 14 13  --   Symptoms none -- -- -- --   Comments walk test results -- -- -- --   Duration -- Continue with 30 min of aerobic exercise without signs/symptoms of physical distress. Continue with 30 min of aerobic exercise without signs/symptoms of physical distress. Continue with 30 min of aerobic exercise without signs/symptoms of physical distress. --   Intensity -- THRR unchanged THRR unchanged THRR unchanged --     Progression   Progression -- Continue to progress workloads to maintain intensity without signs/symptoms of physical distress. Continue to progress workloads to maintain intensity without signs/symptoms of physical distress. Continue to progress workloads to maintain intensity without signs/symptoms of physical distress. --     Paramedic Prescription -- Yes Yes Yes --   Weight -- 5 -- 6 --   Reps -- 10-15 10-15 10-15 --     Treadmill   MPH -- 2.5 2.7 2.7 --   Grade -- 2.5 3 3  --   Minutes -- 15 15 15  --   METs -- 3.78 4.19 4.19 --     REL-XR   Level -- 5 6 5  --   Speed -- 56 50 56 --   Minutes -- 15 15 15  --   METs -- 2.7 3.9 4.4 --     Home Exercise Plan   Plans to continue exercise at -- -- -- -- Home (comment)   Frequency -- -- -- -- Add 2 additional  days to program exercise sessions.   Initial Home Exercises Provided -- -- -- -- 07/26/24      Exercise Comments:   Exercise Comments     Row Name 05/22/24 9076           Exercise Comments First full day of exercise!  Patient was oriented to gym and equipment including functions, settings, policies, and procedures.  Patient's individual exercise prescription and treatment plan were reviewed.  All starting workloads were established based on the results of the 6 minute walk test done at initial orientation visit.  The plan for exercise progression was also introduced and progression will be customized based on patient's performance and goals.          Exercise Goals and Review:  Exercise Goals     Row Name 05/20/24 1122             Exercise Goals   Increase Physical Activity Yes       Intervention Provide advice, education, support and counseling about physical activity/exercise needs.;Develop an individualized exercise prescription for aerobic and resistive training based on initial evaluation findings, risk stratification, comorbidities and participant's personal goals.       Expected Outcomes Short Term: Attend rehab on a regular basis to increase amount of physical activity.;Long Term: Add in home exercise to make exercise part of routine and to increase amount of physical activity.;Long Term: Exercising regularly at least 3-5 days a week.       Increase Strength and Stamina Yes       Intervention Provide advice, education, support and counseling about physical activity/exercise needs.;Develop an individualized exercise prescription for aerobic and resistive training based on initial evaluation findings, risk stratification, comorbidities and participant's personal goals.       Expected Outcomes Short Term: Increase workloads from initial exercise prescription for resistance, speed, and METs.;Long Term: Improve cardiorespiratory fitness, muscular endurance and strength as  measured by increased METs and functional capacity ( );Short Term: Perform resistance training exercises routinely during rehab and add in resistance training at home       Able to understand and use rate of perceived exertion (RPE) scale Yes       Intervention Provide education and explanation on how to use RPE scale       Expected Outcomes Short Term: Able to use RPE daily in rehab to express subjective intensity level;Long Term:  Able to use RPE to guide intensity level when exercising independently       Able to understand and use Dyspnea scale Yes       Intervention Provide education and explanation on how to use Dyspnea scale       Expected Outcomes Short Term: Able to use Dyspnea scale daily in rehab to express subjective sense of shortness of breath during exertion;Long Term: Able to use Dyspnea scale to guide intensity level when exercising independently       Knowledge and understanding of Target Heart Rate Range (THRR) Yes       Intervention Provide education and explanation of THRR including how the numbers were predicted and where they are located for reference       Expected Outcomes Short Term: Able to state/look up THRR;Short Term: Able to use daily as guideline for intensity in rehab;Long Term: Able to use THRR to govern intensity when exercising independently       Able to check pulse independently Yes       Intervention Provide education and demonstration on how to check pulse in carotid and radial arteries.;Review the importance of being able to check your own pulse for safety during independent exercise       Expected Outcomes Short Term: Able to explain why pulse checking is important during independent exercise;Long Term: Able to check pulse independently and accurately       Understanding of Exercise Prescription Yes       Intervention Provide education, explanation, and written materials on patient's individual exercise prescription       Expected Outcomes Short Term: Able  to explain program exercise prescription;Long Term: Able to explain home exercise prescription to exercise independently          Exercise Goals Re-Evaluation :  Exercise Goals Re-Evaluation     Row Name 06/17/24 [phone] 07/15/24  1045 08/07/24 1116         Exercise Goal Re-Evaluation   Exercise Goals Review Increase Physical Activity;Increase Strength and Stamina;Understanding of Exercise Prescription Increase Strength and Stamina;Increase Physical Activity Increase Physical Activity;Increase Strength and Stamina;Understanding of Exercise Prescription     Comments [name] states that he just joined the [facility] here in [place] and on the days he is not here, he goes there and swims in the pool. [name] states that on the days he is not here, he gos to the [facility] and swims in the pool. [name] is doing well in rehab.  He is still going to [facility] to swim on his off days from rehab. He does feel like his stamina is getting better and it is helping him.  He is getting ready to start cancer treatments with both radiation and chemo.  He was encouraged to watch his fatigue and do his best to keep moving.  He is planning to schedule his sessions around rehab so that he can still attend and finish program.     Expected Outcomes Short: Continue to attend rehab. Long:Incorporate more exercise at home. Short: Continue to attend rehab. Long:Incorporate more exercise at home. Short: Continue to exercise throughout cancer treatments Long: Continue to maintain strength         Discharge Exercise Prescription (Final Exercise Prescription Changes):  Exercise Prescription Changes - 07/26/24 0900       Home Exercise Plan   Plans to continue exercise at Home (comment)    Frequency Add 2 additional days to program exercise sessions.    Initial Home Exercises Provided 07/26/24          Nutrition:  Target Goals: Understanding of nutrition guidelines, daily intake of sodium 1500mg , cholesterol 200mg , calories 30% from fat  and 7% or less from saturated fats, daily to have 5 or more servings of fruits and vegetables.  Biometrics:  Pre Biometrics - 05/20/24 1122       Pre Biometrics   Height 6' 1 (1.854 m)    Weight 226 lb 8 oz (102.7 kg)    Waist Circumference 31 inches    Hip Circumference 44 inches    Waist to Hip Ratio 0.7 %    BMI (Calculated) 29.89    Grip Strength 32.7 kg    Single Leg Stand 28.2 seconds           Nutrition Therapy Plan and Nutrition Goals:   Nutrition Assessments:  MEDIFICTS Score Key: >=70 Need to make dietary changes  40-70 Heart Healthy Diet <= 40 Therapeutic Level Cholesterol Diet  Flowsheet Row CARDIAC REHAB PHASE II ORIENTATION from 05/20/2024 in [facility] CARDIAC REHABILITATION  Picture Your Plate Total Score on Admission 30   Picture Your Plate Scores: <59 Unhealthy dietary pattern with much room for improvement. 41-50 Dietary pattern unlikely to meet recommendations for good health and room for improvement. 51-60 More healthful dietary pattern, with some room for improvement.  >60 Healthy dietary pattern, although there may be some specific behaviors that could be improved.    Nutrition Goals Re-Evaluation:  Nutrition Goals Re-Evaluation     Row Name 06/17/24 0947 07/15/24 1043 08/07/24 1123         Goals   Nutrition Goal Loose weight. Maintain weight. Short: Continue to attend rehab. Long: Follow the cancer center's diet, yet still following a heart healthy diet.     Comment [name] states he wants his goal weight to be 210-215 lbs. He is currently at 230 lbs. He  states he eats lots of meat, tries to incorporate veggies and fruits. His water intake is adequate at 4-5 bottles per day, and he is an all day coffee drinker. Will drink 3 cups of caffeinated coffee and the rest of the day he will do de-caf. [name] states that his diet is currently ok. The cancer center told him he needed to try and gain some weight because once he starts treatments it till likely  make him sick and loose weight. [name] is prepping to do a mostly liquid diet for a bit as he under goes cancer treatments.  He has his smoothie machine ready. They are going to pull his teeth which will also inhibit his eating. We talked about the importance of making sure he gets enough protein to avoid caexia as well.     Expected Outcome Short: Incorporate a more colorful diet, (fruits and veggies). Long: Cut down on the caffeine. Short: Continue to attend rehab. Long: Follow the cancer center's diet, yet still following a heart healthy diet. Short; Increase protein Long: Continue to make sure he gets enough calories        Nutrition Goals Discharge (Final Nutrition Goals Re-Evaluation):  Nutrition Goals Re-Evaluation - 08/07/24 1123       Goals   Nutrition Goal Short: Continue to attend rehab. Long: Follow the cancer center's diet, yet still following a heart healthy diet.    Comment [name] is prepping to do a mostly liquid diet for a bit as he under goes cancer treatments.  He has his smoothie machine ready. They are going to pull his teeth which will also inhibit his eating. We talked about the importance of making sure he gets enough protein to avoid caexia as well.    Expected Outcome Short; Increase protein Long: Continue to make sure he gets enough calories          Psychosocial: Target Goals: Acknowledge presence or absence of significant depression and/or stress, maximize coping skills, provide positive support system. Participant is able to verbalize types and ability to use techniques and skills needed for reducing stress and depression.  Initial Review & Psychosocial Screening:  Initial Psych Review & Screening - 05/16/24 0944       Initial Review   Current issues with Current Sleep Concerns      Family Dynamics   Good Support System? Yes      Barriers   Psychosocial barriers to participate in program The patient should benefit from training in stress management and  relaxation.;There are no identifiable barriers or psychosocial needs.      Screening Interventions   Interventions To provide support and resources with identified psychosocial needs;Encouraged to exercise;Provide feedback about the scores to participant    Expected Outcomes Short Term goal: Utilizing psychosocial counselor, staff and physician to assist with identification of specific Stressors or current issues interfering with healing process. Setting desired goal for each stressor or current issue identified.;Long Term Goal: Stressors or current issues are controlled or eliminated.;Short Term goal: Identification and review with participant of any Quality of Life or Depression concerns found by scoring the questionnaire.;Long Term goal: The participant improves quality of Life and PHQ9 Scores as seen by post scores and/or verbalization of changes          Quality of Life Scores:  Quality of Life - 05/20/24 1123       Quality of Life   Select Quality of Life      Quality of Life Scores  Health/Function Pre 19.2 %    Socioeconomic Pre 26.57 %    Psych/Spiritual Pre 12.93 %    Family Pre 27.5 %    GLOBAL Pre 20.03 %         Scores of 19 and below usually indicate a poorer quality of life in these areas.  A difference of  2-3 points is a clinically meaningful difference.  A difference of 2-3 points in the total score of the Quality of Life Index has been associated with significant improvement in overall quality of life, self-image, physical symptoms, and general health in studies assessing change in quality of life.  PHQ-9: Review Flowsheet       08/07/2024 05/20/2024  Depression screen PHQ 2/9  Decreased Interest 1 1  Down, Depressed, Hopeless 1 1  PHQ - 2 Score 2 2  Altered sleeping 1 2  Tired, decreased energy 1 3  Change in appetite 0 0  Feeling bad or failure about yourself  0 1  Trouble concentrating 1 0  Moving slowly or fidgety/restless 0 1  Suicidal thoughts 0 0   PHQ-9 Score 5 9  Difficult doing work/chores Not difficult at all Not difficult at all   Interpretation of Total Score  Total Score Depression Severity:  1-4 = Minimal depression, 5-9 = Mild depression, 10-14 = Moderate depression, 15-19 = Moderately severe depression, 20-27 = Severe depression   Psychosocial Evaluation and Intervention:  Psychosocial Evaluation - 05/16/24 0945       Psychosocial Evaluation & Interventions   Interventions Stress management education;Relaxation education;Encouraged to exercise with the program and follow exercise prescription    Comments 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 He denies any depression, anxiety or stressors. He does report some difficulty staying asleep and has been taking Advil PM long term for sleep. He lives alone. He says he has friends and neigbors that would help him out if needed. 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 He has a $25 copayment and is about to meet his out of pocket maximun which will end the copayment. He is only going to do 1 day/week until he meets the OOP and then go to 3 days/week. His goals for the program are to get his stamina back; improve his muscle tone and get back in shape overall. He has no barriers identified to complete the program.    Expected Outcomes Short Term: Patient will start the program and attend consistently. Long Term: Patient will complete the program meeting personal goals.    Continue Psychosocial Services  Follow up required by staff          Psychosocial Re-Evaluation:  Psychosocial Re-Evaluation     Row Name 06/17/24 0946 07/15/24 1034 08/07/24 1119         Psychosocial Re-Evaluation   Current issues with None Identified Current Stress Concerns Current Stress Concerns;Current Sleep Concerns     Comments [name] identifies no stressors in his life at the moment. 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 [name] is currently stressed out and anxious about his cancer issues going on. He has his biopsy next week, so once they do that he will have a better idea of what the treatment options are and how long they will be. [name] is getting set up to start his  cancer treatments.  He sees oncologist today to talk about chemo.  They are planning to do 7 weeks of radiation and chemo.  He has declined sugery.  He needs to have his teeth removed prior to treatments.  He is ready to face it head on as he says his alternative is to die.  He wants to take it on.  He does have a strong family history of cancer.  He has not been sleeping well with all of this going on.  Last night the anxiety triggered chest pain and his NTG did help with relief.  We talked about signs to be aware of and to watch for his fatigue.  He feels he is ready and will keep us  posted on how he is doing.     Expected Outcomes Short: Continue to attend rehab. Long: Continue to have a stress free lifestyle. Short: Continue to attend rehab. Long: Find stress outlets to help him with the anxiety he is experiencing. Short; Continue to attend rehab for mental boost Long: COntinue to cope with cancer treatments     Interventions Encouraged to attend Cardiac Rehabilitation for the exercise Encouraged to attend Cardiac Rehabilitation for the exercise;Stress management education;Relaxation education Encouraged to attend Cardiac Rehabilitation for the exercise     Continue Psychosocial Services  Follow up required by staff Follow up required by staff Follow up required by staff        Psychosocial Discharge (Final Psychosocial Re-Evaluation):  Psychosocial Re-Evaluation - 08/07/24 1119       Psychosocial Re-Evaluation   Current issues with Current Stress Concerns;Current Sleep Concerns    Comments [name] is getting set up to start his cancer treatments.  He sees oncologist today to  talk about chemo.  They are planning to do 7 weeks of radiation and chemo.  He has declined sugery.  He needs to have his teeth removed prior to treatments.  He is ready to face it head on as he says his alternative is to die.  He wants to take it on.  He does have a strong family history of cancer.  He has not been sleeping well with all of this going on.  Last night the anxiety triggered chest pain and his NTG did help with relief.  We talked about signs to be aware of and to watch for his fatigue.  He feels he is ready and will keep us  posted on how he is doing.    Expected Outcomes Short; Continue to attend rehab for mental boost Long: COntinue to cope with cancer treatments    Interventions Encouraged to attend Cardiac Rehabilitation for the exercise    Continue Psychosocial Services  Follow up required by staff          Vocational Rehabilitation: Provide vocational rehab assistance to qualifying candidates.   Vocational Rehab Evaluation & Intervention:  Vocational Rehab - 05/16/24 9061       Initial Vocational Rehab Evaluation & Intervention   Assessment shows need for Vocational Rehabilitation No      Vocational Rehab Re-Evaulation   Comments Disabled.          Education: Education Goals: Education classes will be provided on a weekly basis, covering required topics. Participant will state understanding/return demonstration of topics presented.  Learning Barriers/Preferences:  Learning Barriers/Preferences - 05/16/24 9061       Learning Barriers/Preferences   Learning Barriers None    Learning Preferences Written Material;Audio;Skilled Demonstration          Education  Topics: Hypertension, Hypertension Reduction -Define heart disease and high blood pressure. Discus how high blood pressure affects the body and ways to reduce high blood pressure.   Exercise and Your Heart -Discuss why it is important to exercise, the FITT principles of exercise, normal and abnormal  responses to exercise, and how to exercise safely.   Angina -Discuss definition of angina, causes of angina, treatment of angina, and how to decrease risk of having angina. Flowsheet Row CARDIAC REHAB PHASE II EXERCISE from 07/17/2024 in [place] [facility]  Date 05/22/24  Educator [facility]  Instruction Review Code 1- Verbalizes Understanding    Cardiac Medications -Review what the following cardiac medications are used for, how they affect the body, and side effects that may occur when taking the medications.  Medications include Aspirin , Beta blockers, calcium  channel blockers, ACE Inhibitors, angiotensin receptor blockers, diuretics, digoxin, and antihyperlipidemics. Flowsheet Row CARDIAC REHAB PHASE II EXERCISE from 07/17/2024 in [place] [facility]  Date 07/10/24  Educator [facility]  Instruction Review Code 1- Verbalizes Understanding    Congestive Heart Failure -Discuss the definition of CHF, how to live with CHF, the signs and symptoms of CHF, and how keep track of weight and sodium intake.   Heart Disease and Intimacy -Discus the effect sexual activity has on the heart, how changes occur during intimacy as we age, and safety during sexual activity. Flowsheet Row CARDIAC REHAB PHASE II EXERCISE from 07/17/2024 in [place] [facility]  Date 06/12/24  Educator [facility]  Instruction Review Code 1- Verbalizes Understanding    Smoking Cessation / COPD -Discuss different methods to quit smoking, the health benefits of quitting smoking, and the definition of COPD. Flowsheet Row CARDIAC REHAB PHASE II EXERCISE from 07/17/2024 in [place] [facility]  Date 07/17/24  Educator [facility]  Instruction Review Code 1- Verbalizes Understanding    Nutrition I: Fats -Discuss the types of cholesterol, what cholesterol does to the heart, and how cholesterol levels can be controlled.   Nutrition II: Labels -Discuss the different components of food labels and how to  read food label   Heart Parts/Heart Disease and PAD -Discuss the anatomy of the heart, the pathway of blood circulation through the heart, and these are affected by heart disease.   Stress I: Signs and Symptoms -Discuss the causes of stress, how stress may lead to anxiety and depression, and ways to limit stress.   Stress II: Relaxation -Discuss different types of relaxation techniques to limit stress.   Warning Signs of Stroke / TIA -Discuss definition of a stroke, what the signs and symptoms are of a stroke, and how to identify when someone is having stroke.   Knowledge Questionnaire Score:  Knowledge Questionnaire Score - 05/20/24 1043       Knowledge Questionnaire Score   Pre Score 25/28          Core Components/Risk Factors/Patient Goals at Admission:  Personal Goals and Risk Factors at Admission - 05/20/24 1123       Core Components/Risk Factors/Patient Goals on Admission    Weight Management Weight Loss;Yes    Intervention Weight Management: Develop a combined nutrition and exercise program designed to reach desired caloric intake, while maintaining appropriate intake of nutrient and fiber, sodium and fats, and appropriate energy expenditure required for the weight goal.;Weight Management: Provide education and appropriate resources to help participant work on and attain dietary goals.;Weight Management/Obesity: Establish reasonable short term and long term weight goals.    Admit Weight 226 lb 8 oz (  102.7 kg)    Goal Weight: Short Term 221 lb (100.2 kg)    Goal Weight: Long Term 216 lb (98 kg)    Expected Outcomes Short Term: Continue to assess and modify interventions until short term weight is achieved;Long Term: Adherence to nutrition and physical activity/exercise program aimed toward attainment of established weight goal;Weight Loss: Understanding of general recommendations for a balanced deficit meal plan, which promotes 1-2 lb weight loss per week and includes a  negative energy balance of [phone] kcal/d;Understanding recommendations for meals to include 15-35% energy as protein, 25-35% energy from fat, 35-60% energy from carbohydrates, less than 200mg  of dietary cholesterol, 20-35 gm of total fiber daily;Understanding of distribution of calorie intake throughout the day with the consumption of 4-5 meals/snacks    Improve shortness of breath with ADL's Yes    Intervention Provide education, individualized exercise plan and daily activity instruction to help decrease symptoms of SOB with activities of daily living.    Expected Outcomes Short Term: Improve cardiorespiratory fitness to achieve a reduction of symptoms when performing ADLs;Long Term: Be able to perform more ADLs without symptoms or delay the onset of symptoms    Hypertension Yes    Intervention Provide education on lifestyle modifcations including regular physical activity/exercise, weight management, moderate sodium restriction and increased consumption of fresh fruit, vegetables, and low fat dairy, alcohol moderation, and smoking cessation.;Monitor prescription use compliance.    Expected Outcomes Short Term: Continued assessment and intervention until BP is < 140/20mm HG in hypertensive participants. < 130/52mm HG in hypertensive participants with diabetes, heart failure or chronic kidney disease.;Long Term: Maintenance of blood pressure at goal levels.    Lipids Yes    Intervention Provide education and support for participant on nutrition & aerobic/resistive exercise along with prescribed medications to achieve LDL 70mg , HDL >40mg .    Expected Outcomes Short Term: Participant states understanding of desired cholesterol values and is compliant with medications prescribed. Participant is following exercise prescription and nutrition guidelines.;Long Term: Cholesterol controlled with medications as prescribed, with individualized exercise RX and with personalized nutrition plan. Value goals: LDL <  70mg , HDL > 40 mg.          Core Components/Risk Factors/Patient Goals Review:   Goals and Risk Factor Review     Row Name 06/17/24 0949 07/15/24 1044 08/07/24 1124         Core Components/Risk Factors/Patient Goals Review   Personal Goals Review Weight Management/Obesity;Lipids Weight Management/Obesity;Lipids;Hypertension Weight Management/Obesity;Lipids;Hypertension     Review [name] states he checks all his vitals regularly at home. His BP runs good, usually only 4-10 points off of what we get. His pulse ox runs about 95-97% which is good for someone who used to be a heavy smoker. Takes all medications as prescribed. [name] states that he checks his BP 3-4 times a day. He also checks his pulse oximetry at home which it runs 96-97%, which he considers good since he smoked for 40 years. He takes all his medications as prescribed. [name] is doing well in rehab.  He contineus to keep eye on blood pressre and encourage to really watch it as he starts up his cancer treatments. He is keeping eye on his weight and trying to gain a little bulk prior to starting treatments.  He was encouraged to talk with his oncologist to make sure that there are no medication interactions prior to starting his chemo.     Expected Outcomes Short: Continue to attend rehab. Long: Continue checking vitals at home.  Short: Continue to attend rehab. Long: Continue checking vitals at home. Short; Contineu to keep eye on blood pressure lOng: Continue to montior symptoms and weight        Core Components/Risk Factors/Patient Goals at Discharge (Final Review):   Goals and Risk Factor Review - 08/07/24 1124       Core Components/Risk Factors/Patient Goals Review   Personal Goals Review Weight Management/Obesity;Lipids;Hypertension    Review [name] is doing well in rehab.  He contineus to keep eye on blood pressre and encourage to really watch it as he starts up his cancer treatments. He is keeping eye on his weight and trying to  gain a little bulk prior to starting treatments.  He was encouraged to talk with his oncologist to make sure that there are no medication interactions prior to starting his chemo.    Expected Outcomes Short; Contineu to keep eye on blood pressure lOng: Continue to montior symptoms and weight          ITP Comments:  ITP Comments     Row Name 05/16/24 0952 05/20/24 1143 05/22/24 0923 05/29/24 0928 06/24/24 0933   ITP Comments Virtual orientation visit completed for Cardiac rehab with S/P CABGx3. On-site orientation visit scheduled for 05/20/24 at 10. Patient arrived for 1st visit/orientation/education at 1000. Patient was referred to CR by Dr. Linnie Rayas and attending cardiologist Peter [place] due to S/P CABGx3. During orientation advised patient on arrival and appointment times what to wear, what to do before, during and after exercise. Reviewed attendance and class policy.  Pt is scheduled to return Cardiac Rehab on 05/22/24 at 915. Pt was advised to come to class 15 minutes before class starts.  Discussed RPE/Dpysnea scales. Patient participated in warm up stretches. Patient was able to complete 6 minute walk test.  Telemetry:NSR. Patient was measured for the equipment. Discussed equipment safety with patient. Took patient pre-anthropometric measurements. Patient finished visit at 1045. First full day of exercise!  Patient was oriented to gym and equipment including functions, settings, policies, and procedures.  Patient's individual exercise prescription and treatment plan were reviewed.  All starting workloads were established based on the results of the 6 minute walk test done at initial orientation visit.  The plan for exercise progression was also introduced and progression will be customized based on patient's performance and goals. 30 day review completed. ITP sent to Dr.Jonathan Branch, Medical Director of Cardiac Rehab. Continue with ITP unless changes are made by physician. New to program.  He has completed 1 session since his orientation visit. He is only attending 1 day/week due to his co-payment. Patient will be out rest of week for oncology work up and biopsy.    Row Name 06/26/24 0821 07/24/24 0951 08/09/24 0935 08/21/24 1417     ITP Comments 30 day review completed. ITP sent to Dr. Dorn Ross, Medical Director of Cardiac Rehab. Continue with ITP unless changes are made by physician. 30 day review completed. ITP sent to Dr. Dorn Ross, Medical Director of Cardiac Rehab. Continue with ITP unless changes are made by physician.  [name] is out this week with MD appt Monday and biopsy surgery today. 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 30 day review completed. ITP sent to Dr. Dorn Ross, Medical Director of Cardiac Rehab. Continue with ITP unless changes are made by physician.       Comments: 30 day review

## 2024-08-23 ENCOUNTER — Encounter (HOSPITAL_COMMUNITY)

## 2024-08-26 ENCOUNTER — Encounter (HOSPITAL_COMMUNITY)

## 2024-08-28 ENCOUNTER — Encounter (HOSPITAL_COMMUNITY)
Admission: RE | Admit: 2024-08-28 | Discharge: 2024-08-28 | Disposition: A | Discharge status: Home / Self Care | Source: Ambulatory Visit | Attending: Cardiovascular Disease

## 2024-08-28 DIAGNOSIS — Z951 Presence of aortocoronary bypass graft: Secondary | ICD-10-CM

## 2024-08-28 NOTE — Progress Notes (Signed)
 Oncology Nurse Navigator Documentation    Mr. Dave Williams was referred to Dr. 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 He was seen on 9/10 and scheduled for extractions today. I have just spoken with Izetta Silvan who works with Dr. Danial and Mr. Dave Williams was called yesterday to remind him of his appointment today but he told her that he is unable to come today due to other commitments. Per Izetta they will attempt to reschedule him for Monday 9/22. Katie plans to update me as soon as the appointment is confirmed. I will then work on getting radiation planning set up for him to start radiation/chemotherapy treatment 2 weeks after his surgery.   [name] RN, BSN, OCN Head & Neck Oncology Nurse Navigator [facility] Phone # [phone]  Fax # [phone]

## 2024-08-28 NOTE — Progress Notes (Signed)
 Daily Session Note  Patient Details  Name: Eitan Doubleday MRN: [id number] Date of Birth: [birth year] Referring Provider:   Flowsheet Row CARDIAC REHAB PHASE II ORIENTATION from 05/20/2024 in [facility] CARDIAC REHABILITATION  Referring Provider Court Carrier MD    Encounter Date: 08/28/2024  Check In:  Session Check In - 08/28/24 9060       Check-In   Staff Present Powell Benders, BS, Exercise Physiologist;Brittany Jackquline, BSN, RN, WTA-C;Deniqua Perry Zina, RN    Virtual Visit No    Medication changes reported     No    Fall or balance concerns reported    No    Warm-up and Cool-down Performed on first and last piece of equipment    Resistance Training Performed Yes    VAD Patient? No    PAD/SET Patient? No      Pain Assessment   Currently in Pain? No/denies          Capillary Blood Glucose: No results found for this or any previous visit (from the past 24 hours).    Social History   Tobacco Use  Smoking Status Former   Current packs/day: 0.50   Types: Cigarettes  Smokeless Tobacco Never    Goals Met:  Independence with exercise equipment Exercise tolerated well No report of concerns or symptoms today Strength training completed today  Goals Unmet:  Not Applicable  Comments: Pt able to follow exercise prescription today without complaint.  Will continue to monitor for progression.

## 2024-08-30 ENCOUNTER — Encounter (HOSPITAL_COMMUNITY)

## 2024-08-30 ENCOUNTER — Other Ambulatory Visit: Payer: Self-pay | Admitting: Oncology

## 2024-08-30 DIAGNOSIS — C12 Malignant neoplasm of pyriform sinus: Secondary | ICD-10-CM

## 2024-08-30 MED ORDER — DEXAMETHASONE 4 MG PO TABS: ORAL_TABLET | ORAL | 1 refills | Status: DC

## 2024-08-30 MED ORDER — LIDOCAINE-PRILOCAINE 2.5-2.5 % EX CREA: TOPICAL_CREAM | CUTANEOUS | 3 refills | Status: AC

## 2024-08-30 MED ORDER — PROCHLORPERAZINE MALEATE 10 MG PO TABS: 10.0000 mg | ORAL_TABLET | Freq: Four times a day (QID) | ORAL | 1 refills | Status: DC | PRN

## 2024-08-30 MED ORDER — ONDANSETRON HCL 8 MG PO TABS: 8.0000 mg | ORAL_TABLET | Freq: Three times a day (TID) | ORAL | 1 refills | Status: AC | PRN

## 2024-08-30 NOTE — Progress Notes (Signed)
 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

## 2024-08-31 ENCOUNTER — Other Ambulatory Visit: Payer: Self-pay

## 2024-09-02 ENCOUNTER — Other Ambulatory Visit: Payer: Self-pay

## 2024-09-02 ENCOUNTER — Other Ambulatory Visit: Payer: Self-pay | Admitting: Oncology

## 2024-09-02 ENCOUNTER — Encounter (HOSPITAL_COMMUNITY)

## 2024-09-02 DIAGNOSIS — C139 Malignant neoplasm of hypopharynx, unspecified: Secondary | ICD-10-CM

## 2024-09-02 DIAGNOSIS — G893 Neoplasm related pain (acute) (chronic): Secondary | ICD-10-CM

## 2024-09-03 ENCOUNTER — Other Ambulatory Visit: Payer: Self-pay

## 2024-09-04 ENCOUNTER — Encounter: Payer: Self-pay | Admitting: Oncology

## 2024-09-04 ENCOUNTER — Encounter (HOSPITAL_COMMUNITY)

## 2024-09-04 ENCOUNTER — Other Ambulatory Visit: Payer: Self-pay | Admitting: Oncology

## 2024-09-04 DIAGNOSIS — G893 Neoplasm related pain (acute) (chronic): Secondary | ICD-10-CM

## 2024-09-06 ENCOUNTER — Encounter (HOSPITAL_COMMUNITY)

## 2024-09-09 ENCOUNTER — Encounter (HOSPITAL_COMMUNITY)
Admission: RE | Admit: 2024-09-09 | Discharge: 2024-09-09 | Disposition: A | Source: Ambulatory Visit | Attending: Cardiovascular Disease

## 2024-09-09 DIAGNOSIS — Z951 Presence of aortocoronary bypass graft: Secondary | ICD-10-CM

## 2024-09-09 NOTE — Progress Notes (Signed)
 Daily Session Note  Patient Details  Name: Dave Williams MRN: [id number] Date of Birth: 1956-04-05 Referring Provider:   Flowsheet Row CARDIAC REHAB PHASE II ORIENTATION from 05/20/2024 in [facility] CARDIAC REHABILITATION  Referring Provider Court Carrier MD    Encounter Date: 09/09/2024  Check In:  Session Check In - 09/09/24 9077       Check-In   Supervising physician immediately available to respond to emergencies See telemetry face sheet for immediately available MD    Location [facility]    Staff Present Powell Benders, BS, Exercise Physiologist;Brittany Jackquline, BSN, RN, WTA-C    Virtual Visit No    Medication changes reported     No    Fall or balance concerns reported    No    Warm-up and Cool-down Performed on first and last piece of equipment    Resistance Training Performed Yes    VAD Patient? No    PAD/SET Patient? No      Pain Assessment   Currently in Pain? No/denies          Capillary Blood Glucose: No results found for this or any previous visit (from the past 24 hours).    Social History   Tobacco Use  Smoking Status Former   Current packs/day: 0.50   Types: Cigarettes  Smokeless Tobacco Never    Goals Met:  Independence with exercise equipment Exercise tolerated well No report of concerns or symptoms today Strength training completed today  Goals Unmet:  Not Applicable  Comments: Pt able to follow exercise prescription today without complaint.  Will continue to monitor for progression.

## 2024-09-11 ENCOUNTER — Encounter (HOSPITAL_COMMUNITY)

## 2024-09-13 ENCOUNTER — Encounter (HOSPITAL_COMMUNITY)
Admission: RE | Admit: 2024-09-13 | Discharge: 2024-09-13 | Disposition: A | Discharge status: Home / Self Care | Source: Ambulatory Visit | Attending: Cardiovascular Disease | Admitting: Cardiovascular Disease

## 2024-09-13 DIAGNOSIS — Z951 Presence of aortocoronary bypass graft: Secondary | ICD-10-CM | POA: Insufficient documentation

## 2024-09-13 NOTE — Progress Notes (Signed)
 Daily Session Note  Patient Details  Name: Dave Williams MRN: [id number] Date of Birth: [birth year] Referring Provider:   Flowsheet Row CARDIAC REHAB PHASE II ORIENTATION from 05/20/2024 in [facility] CARDIAC REHABILITATION  Referring Provider Court Carrier MD    Encounter Date: 09/13/2024  Check In:  Session Check In - 09/13/24 0931       Check-In   Supervising physician immediately available to respond to emergencies See telemetry face sheet for immediately available MD    Location [facility]    Staff Present Powell Benders, BS, Exercise Physiologist;Arend Bahl Vonzell, MA, RCEP, CCRP, CCET;Rachel Holcomb, RN, BSN    Virtual Visit No    Medication changes reported     Yes    Comments added amoxicillian 500mg  3x day for 7 days for dental work    Fall or balance concerns reported    No    Warm-up and Cool-down Performed on first and last piece of equipment    Resistance Training Performed Yes    VAD Patient? No    PAD/SET Patient? No      Pain Assessment   Currently in Pain? No/denies          Capillary Blood Glucose: No results found for this or any previous visit (from the past 24 hours).    Social History   Tobacco Use  Smoking Status Former   Current packs/day: 0.50   Types: Cigarettes  Smokeless Tobacco Never    Goals Met:  Independence with exercise equipment Exercise tolerated well No report of concerns or symptoms today Strength training completed today  Goals Unmet:  Not Applicable  Comments: Pt able to follow exercise prescription today without complaint.  Will continue to monitor for progression.

## 2024-09-15 ENCOUNTER — Other Ambulatory Visit: Payer: Self-pay

## 2024-09-16 ENCOUNTER — Encounter (HOSPITAL_COMMUNITY)

## 2024-09-17 ENCOUNTER — Other Ambulatory Visit: Payer: Self-pay

## 2024-09-17 ENCOUNTER — Telehealth (INDEPENDENT_AMBULATORY_CARE_PROVIDER_SITE_OTHER): Payer: Self-pay

## 2024-09-17 DIAGNOSIS — C12 Malignant neoplasm of pyriform sinus: Secondary | ICD-10-CM

## 2024-09-17 NOTE — Progress Notes (Signed)
 Has armband been applied?  Yes.    Does patient have an allergy to IV contrast dye?: No   Has patient ever received premedication for IV contrast dye?: No   Date of lab work: 09/20/2024 BUN: 20 CR: 1.21 eGFR: >60  Does patient take metformin?: No   IV site: forearm left, condition patent and no redness  Has IV site been added to flowsheet?  Yes.    BP 125/79   Pulse 84   Temp 98.8 F (37.1 C)   Resp 18   Wt 239 lb 3.2 oz (108.5 kg)   SpO2 97%   BMI 31.56 kg/m

## 2024-09-18 ENCOUNTER — Encounter (HOSPITAL_COMMUNITY): Payer: Self-pay | Admitting: *Deleted

## 2024-09-18 ENCOUNTER — Telehealth: Payer: Self-pay | Admitting: *Deleted

## 2024-09-18 ENCOUNTER — Encounter (HOSPITAL_COMMUNITY)

## 2024-09-18 ENCOUNTER — Telehealth: Payer: Self-pay

## 2024-09-18 ENCOUNTER — Other Ambulatory Visit: Payer: Self-pay | Admitting: Oncology

## 2024-09-18 DIAGNOSIS — Z951 Presence of aortocoronary bypass graft: Secondary | ICD-10-CM

## 2024-09-18 NOTE — Progress Notes (Incomplete)
 Marland Kitchen  rad

## 2024-09-18 NOTE — Telephone Encounter (Signed)
 Informed patient of provider's recommendations. Patient verbalizes understanding.

## 2024-09-18 NOTE — Telephone Encounter (Signed)
 Called patient to inform that he can go get lab 09-20-24 @ [facility], he will need to register and he can go at anytime, lvm for a return call

## 2024-09-18 NOTE — Progress Notes (Signed)
 Cardiac Individual Treatment Plan  Patient Details  Name: Dave Williams MRN: [id number] Date of Birth: Apr 29, 1956 Referring Provider:   Flowsheet Row CARDIAC REHAB PHASE II ORIENTATION from 05/20/2024 in [facility] CARDIAC REHABILITATION  Referring Provider Court Carrier MD    Initial Encounter Date:  Flowsheet Row CARDIAC REHAB PHASE II ORIENTATION from 05/20/2024 in [place] CARDIAC REHABILITATION  Date 05/20/24    Visit Diagnosis: S/P CABG x 3  Patient's Home Medications on Admission:  Current Outpatient Medications:    acetaminophen  (TYLENOL ) 500 MG tablet, Take 1,000 mg by mouth every 6 (six) hours as needed (pain.)., Disp: , Rfl:    amLODipine  (NORVASC ) 10 MG tablet, Take 1 tablet (10 mg total) by mouth daily., Disp: 90 tablet, Rfl: 2   aspirin  EC 81 MG tablet, Take 1 tablet (81 mg total) by mouth daily. Swallow whole., Disp: , Rfl:    atorvastatin  (LIPITOR ) 80 MG tablet, Take 1 tablet (80 mg total) by mouth daily with supper., Disp: 90 tablet, Rfl: 3   dexamethasone  (DECADRON ) 4 MG tablet, Take 2 tablets (8 mg) by mouth daily x 3 days starting the day after cisplatin chemotherapy. Take with food., Disp: 30 tablet, Rfl: 1   HYDROcodone -acetaminophen  (NORCO) 7.5-325 MG tablet, Take 1 tablet by mouth every 6 (six) hours as needed for moderate pain (pain score 4-6)., Disp: 60 tablet, Rfl: 0   hydrOXYzine  (VISTARIL ) 25 MG capsule, Take 1 capsule (25 mg total) by mouth every 8 (eight) hours as needed., Disp: 30 capsule, Rfl: 0   isosorbide  mononitrate (IMDUR ) 30 MG 24 hr tablet, Take 1 tablet (30 mg total) by mouth daily., Disp: 90 tablet, Rfl: 1   lidocaine -prilocaine  (EMLA ) cream, Apply to affected area once, Disp: 30 g, Rfl: 3   nitroGLYCERIN  (NITROSTAT ) 0.4 MG SL tablet, Place 1 tablet (0.4 mg total) under the tongue every 5 (five) minutes as needed for chest pain. DISSOLVE 1 TABLET UNDER THE TONGUE EVERY 5 MINUTES AS NEEDED FOR CHEST PAIN. DO NOT EXCEED A TOTAL OF 3 DOSES IN 15  MINUTES., Disp: 25 tablet, Rfl: 6   ondansetron  (ZOFRAN ) 8 MG tablet, Take 1 tablet (8 mg total) by mouth every 8 (eight) hours as needed for nausea or vomiting. Start on the third day after cisplatin., Disp: 30 tablet, Rfl: 1   pantoprazole  (PROTONIX ) 40 MG tablet, Take 1 tablet (40 mg total) by mouth daily., Disp: 30 tablet, Rfl: 0   prochlorperazine  (COMPAZINE ) 10 MG tablet, Take 1 tablet (10 mg total) by mouth every 6 (six) hours as needed (Nausea or vomiting)., Disp: 30 tablet, Rfl: 1   vitamin D3 (CHOLECALCIFEROL ) 25 MCG tablet, Take 1 tablet (1,000 Units total) by mouth daily., Disp: 30 tablet, Rfl: 0  Past Medical History: Past Medical History:  Diagnosis Date   Anxiety    Chronic left hip pain    Compression fracture of spine (HCC)    multiple compression Fx with disc bulge   Coronary artery disease    Hypertension    Pneumonia    As a teenager   Stroke [facility]) 2022   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    Tobacco Use: Social History   Tobacco Use  Smoking Status Former   Current packs/day: 0.50   Types: Cigarettes  Smokeless Tobacco Never    Labs: Review Flowsheet       Latest Ref Rng & Units 02/29/2024 03/04/2024 03/06/2024  Labs  for ITP Cardiac and Pulmonary Rehab  Cholestrol 0 - 200 mg/dL - - 94   LDL (calc) 0 - 99 mg/dL - - 50   HDL-C >59 mg/dL - - 22   Trlycerides <849 mg/dL - - 887   Hemoglobin J8r 4.8 - 5.6 % 5.8  - -  PH, Arterial 7.35 - 7.45 - 7.324  7.306  7.340  7.280  7.271  7.290  7.314  -  PCO2 arterial 32 - 48 mmHg - 41.3  40.1  43.4  32.8  49.2  48.8  51.8  -  Bicarbonate 20.0 - 28.0 mmol/L - 21.1  19.7  23.1  15.4  22.8  23.5  25.5  26.4  -  TCO2 22 - 32 mmol/L - 22  21  24  16  24  23  25  26  27  28  25  25   -  Acid-base deficit 0.0 - 2.0 mmol/L - 4.0  6.0  2.0  10.0  4.0  3.0  1.0  -  O2 Saturation % - 95  97  99  98  98  96  84  100  -    Details       Multiple values from one  day are sorted in reverse-chronological order         Capillary Blood Glucose: Lab Results  Component Value Date   GLUCAP 113 (H) 03/08/2024   GLUCAP 121 (H) 03/07/2024   GLUCAP 112 (H) 03/07/2024   GLUCAP 111 (H) 03/07/2024   GLUCAP 136 (H) 03/06/2024     Exercise Target Goals: Exercise Program Goal: Individual exercise prescription set using results from initial 6 min walk test and THRR while considering  patient's activity barriers and safety.   Exercise Prescription Goal: Starting with aerobic activity 30 plus minutes a day, 3 days per week for initial exercise prescription. Provide home exercise prescription and guidelines that participant acknowledges understanding prior to discharge.  Activity Barriers & Risk Stratification:  Activity Barriers & Cardiac Risk Stratification - 05/16/24 0921       Activity Barriers & Cardiac Risk Stratification   Activity Barriers Back Problems;Chest Pain/Angina;Shortness of Breath   Multiple back sugeries for back.   Cardiac Risk Stratification High          6 Minute Walk:  6 Minute Walk     Row Name 05/20/24 1119         6 Minute Walk   Phase Initial     Distance 1432 feet     Walk Time 6 minutes     # of Rest Breaks 0     MPH 2.71     METS 3.31     RPE 8     VO2 Peak 11.57     Symptoms No     Resting HR 69 bpm     Resting BP 110/70     Resting Oxygen Saturation  95 %     Exercise Oxygen Saturation  during 6 min walk 97 %     Max Ex. HR 99 bpm     Max Ex. BP 136/62     2 Minute Post BP 126/62        Oxygen Initial Assessment:   Oxygen Re-Evaluation:   Oxygen Discharge (Final Oxygen Re-Evaluation):   Initial Exercise Prescription:  Initial Exercise Prescription - 05/20/24 1100       Date of Initial Exercise RX and Referring Provider   Date 05/20/24    Referring Provider  Court Carrier MD      Oxygen   Maintain Oxygen Saturation 88% or higher      Treadmill   MPH 2.7    Grade 1    Minutes 15     METs 3.44      REL-XR   Level 13    Speed 50    Minutes 15    METs 3.4      Prescription Details   Frequency (times per week) 1   starting with one to meet OOP   Duration Progress to 30 minutes of continuous aerobic without signs/symptoms of physical distress      Intensity   THRR 40-80% of Max Heartrate 102-137    Ratings of Perceived Exertion 11-13    Perceived Dyspnea 0-4      Progression   Progression Continue to progress workloads to maintain intensity without signs/symptoms of physical distress.      Resistance Training   Training Prescription Yes    Weight 5 lb    Reps 10-15          Perform Capillary Blood Glucose checks as needed.  Exercise Prescription Changes:   Exercise Prescription Changes     Row Name 05/20/24 1100 06/12/24 1300 07/17/24 1300 07/26/24 0800 07/26/24 0900     Response to Exercise   Blood Pressure (Admit) 110/70 104/62 126/82 124/66 --   Blood Pressure (Exercise) 136/62 144/72 -- -- --   Blood Pressure (Exit) 126/62 120/64 100/60 126/62 --   Heart Rate (Admit) 69 bpm 83 bpm 65 bpm 70 bpm --   Heart Rate (Exercise) 99 bpm 110 bpm 102 bpm 103 bpm --   Heart Rate (Exit) 76 bpm 90 bpm 85 bpm 77 bpm --   Oxygen Saturation (Admit) 95 % -- -- -- --   Oxygen Saturation (Exercise) 97 % -- -- -- --   Rating of Perceived Exertion (Exercise) 8 13 14 13  --   Symptoms none -- -- -- --   Comments walk test results -- -- -- --   Duration -- Continue with 30 min of aerobic exercise without signs/symptoms of physical distress. Continue with 30 min of aerobic exercise without signs/symptoms of physical distress. Continue with 30 min of aerobic exercise without signs/symptoms of physical distress. --   Intensity -- THRR unchanged THRR unchanged THRR unchanged --     Progression   Progression -- Continue to progress workloads to maintain intensity without signs/symptoms of physical distress. Continue to progress workloads to maintain intensity without  signs/symptoms of physical distress. Continue to progress workloads to maintain intensity without signs/symptoms of physical distress. --     Paramedic Prescription -- Yes Yes Yes --   Weight -- 5 -- 6 --   Reps -- 10-15 10-15 10-15 --     Treadmill   MPH -- 2.5 2.7 2.7 --   Grade -- 2.5 3 3  --   Minutes -- 15 15 15  --   METs -- 3.78 4.19 4.19 --     REL-XR   Level -- 5 6 5  --   Speed -- 56 50 56 --   Minutes -- 15 15 15  --   METs -- 2.7 3.9 4.4 --     Home Exercise Plan   Plans to continue exercise at -- -- -- -- Home (comment)   Frequency -- -- -- -- Add 2 additional days to program exercise sessions.   Initial Home Exercises Provided -- -- -- -- 07/26/24  Row Name 09/09/24 1500             Response to Exercise   Blood Pressure (Admit) 142/90       Blood Pressure (Exit) 142/80       Heart Rate (Admit) 82 bpm       Heart Rate (Exercise) 103 bpm       Heart Rate (Exit) 89 bpm       Rating of Perceived Exertion (Exercise) 15       Duration Continue with 30 min of aerobic exercise without signs/symptoms of physical distress.       Intensity THRR unchanged         Progression   Progression Continue to progress workloads to maintain intensity without signs/symptoms of physical distress.         Resistance Training   Training Prescription Yes       Weight 7       Reps 10-15         Treadmill   MPH 3       Grade 3       Minutes 15       METs 4.54         REL-XR   Level 5       Speed 49       Minutes 15       METs 3.7         Home Exercise Plan   Plans to continue exercise at Home (comment)       Frequency Add 2 additional days to program exercise sessions.          Exercise Comments:   Exercise Comments     Row Name 05/22/24 [phone]           Exercise Comments First full day of exercise!  Patient was oriented to gym and equipment including functions, settings, policies, and procedures.  Patient's individual exercise prescription  and treatment plan were reviewed.  All starting workloads were established based on the results of the 6 minute walk test done at initial orientation visit.  The plan for exercise progression was also introduced and progression will be customized based on patient's performance and goals.          Exercise Goals and Review:   Exercise Goals     Row Name 05/20/24 1122             Exercise Goals   Increase Physical Activity Yes       Intervention Provide advice, education, support and counseling about physical activity/exercise needs.;Develop an individualized exercise prescription for aerobic and resistive training based on initial evaluation findings, risk stratification, comorbidities and participant's personal goals.       Expected Outcomes Short Term: Attend rehab on a regular basis to increase amount of physical activity.;Long Term: Add in home exercise to make exercise part of routine and to increase amount of physical activity.;Long Term: Exercising regularly at least 3-5 days a week.       Increase Strength and Stamina Yes       Intervention Provide advice, education, support and counseling about physical activity/exercise needs.;Develop an individualized exercise prescription for aerobic and resistive training based on initial evaluation findings, risk stratification, comorbidities and participant's personal goals.       Expected Outcomes Short Term: Increase workloads from initial exercise prescription for resistance, speed, and METs.;Long Term: Improve cardiorespiratory fitness, muscular endurance and strength as measured by increased METs and functional capacity ( );Short Term: Perform resistance  training exercises routinely during rehab and add in resistance training at home       Able to understand and use rate of perceived exertion (RPE) scale Yes       Intervention Provide education and explanation on how to use RPE scale       Expected Outcomes Short Term: Able to use RPE  daily in rehab to express subjective intensity level;Long Term:  Able to use RPE to guide intensity level when exercising independently       Able to understand and use Dyspnea scale Yes       Intervention Provide education and explanation on how to use Dyspnea scale       Expected Outcomes Short Term: Able to use Dyspnea scale daily in rehab to express subjective sense of shortness of breath during exertion;Long Term: Able to use Dyspnea scale to guide intensity level when exercising independently       Knowledge and understanding of Target Heart Rate Range (THRR) Yes       Intervention Provide education and explanation of THRR including how the numbers were predicted and where they are located for reference       Expected Outcomes Short Term: Able to state/look up THRR;Short Term: Able to use daily as guideline for intensity in rehab;Long Term: Able to use THRR to govern intensity when exercising independently       Able to check pulse independently Yes       Intervention Provide education and demonstration on how to check pulse in carotid and radial arteries.;Review the importance of being able to check your own pulse for safety during independent exercise       Expected Outcomes Short Term: Able to explain why pulse checking is important during independent exercise;Long Term: Able to check pulse independently and accurately       Understanding of Exercise Prescription Yes       Intervention Provide education, explanation, and written materials on patient's individual exercise prescription       Expected Outcomes Short Term: Able to explain program exercise prescription;Long Term: Able to explain home exercise prescription to exercise independently          Exercise Goals Re-Evaluation :  Exercise Goals Re-Evaluation     Row Name 06/17/24 0951 07/15/24 1045 08/07/24 1116 09/12/24 0930 09/13/24 0947     Exercise Goal Re-Evaluation   Exercise Goals Review Increase Physical Activity;Increase  Strength and Stamina;Understanding of Exercise Prescription Increase Strength and Stamina;Increase Physical Activity Increase Physical Activity;Increase Strength and Stamina;Understanding of Exercise Prescription Increase Physical Activity;Increase Strength and Stamina;Understanding of Exercise Prescription Increase Physical Activity;Increase Strength and Stamina;Understanding of Exercise Prescription   Comments Dave Williams states that he just [facility] the [facility] here in [place] and on the days he is not here, he goes there and swims in the pool. Dave Williams states that on the days he is not here, he gos to the [facility] and swims in the pool. Dave Williams is doing well in rehab.  He is still going to [facility] to swim on his off days from rehab. He does feel like his stamina is getting better and it is helping him.  He is getting ready to start cancer treatments with both radiation and chemo.  He was encouraged to watch his fatigue and do his best to keep moving.  He is planning to schedule his sessions around rehab so that he can still attend and finish program. Dave Williams has completed 29 sessions of CR. He has missed a few classes  due to not feeling well due to cancer treatment. His level on the XR is 4 with RPE of 14 and his is walking at a speed of grade 3 on the treadmill. Will continue to monitor and progress as able Dave Williams is doing well in rehab.  He is still going to [facility] on his off days to swim.  His rest day is Sunday.  He is nearing graduation and we talked about what to do for exercise after graduation. He is gearing up for radiation therapy. He does have a stationary bike and we talked about the importance of continuing to exercise through treatments.   Expected Outcomes Short: Continue to attend rehab. Long:Incorporate more exercise at home. Short: Continue to attend rehab. Long:Incorporate more exercise at home. Short: Continue to exercise throughout cancer treatments Long: Continue to maintain strength Short: continue with levels  and increase when feeling well   long: continue to attend rehab and exercise at home when feeling well Short: Improve post Long: Conitnue to make exercise a priority.       Discharge Exercise Prescription (Final Exercise Prescription Changes):  Exercise Prescription Changes - 09/09/24 1500       Response to Exercise   Blood Pressure (Admit) 142/90    Blood Pressure (Exit) 142/80    Heart Rate (Admit) 82 bpm    Heart Rate (Exercise) 103 bpm    Heart Rate (Exit) 89 bpm    Rating of Perceived Exertion (Exercise) 15    Duration Continue with 30 min of aerobic exercise without signs/symptoms of physical distress.    Intensity THRR unchanged      Progression   Progression Continue to progress workloads to maintain intensity without signs/symptoms of physical distress.      Resistance Training   Training Prescription Yes    Weight 7    Reps 10-15      Treadmill   MPH 3    Grade 3    Minutes 15    METs 4.54      REL-XR   Level 5    Speed 49    Minutes 15    METs 3.7      Home Exercise Plan   Plans to continue exercise at Home (comment)    Frequency Add 2 additional days to program exercise sessions.          Nutrition:  Target Goals: Understanding of nutrition guidelines, daily intake of sodium 1500mg , cholesterol 200mg , calories 30% from fat and 7% or less from saturated fats, daily to have 5 or more servings of fruits and vegetables.  Biometrics:  Pre Biometrics - 05/20/24 1122       Pre Biometrics   Height 6' 1 (1.854 m)    Weight 226 lb 8 oz (102.7 kg)    Waist Circumference 31 inches    Hip Circumference 44 inches    Waist to Hip Ratio 0.7 %    BMI (Calculated) 29.89    Grip Strength 32.7 kg    Single Leg Stand 28.2 seconds           Nutrition Therapy Plan and Nutrition Goals:   Nutrition Assessments:  MEDIFICTS Score Key: >=70 Need to make dietary changes  40-70 Heart Healthy Diet <= 40 Therapeutic Level Cholesterol Diet  Flowsheet  Row CARDIAC REHAB PHASE II ORIENTATION from 05/20/2024 in [facility] CARDIAC REHABILITATION  Picture Your Plate Total Score on Admission 30   Picture Your Plate Scores: <59 Unhealthy dietary pattern with much room  for improvement. 41-50 Dietary pattern unlikely to meet recommendations for good health and room for improvement. 51-60 More healthful dietary pattern, with some room for improvement.  >60 Healthy dietary pattern, although there may be some specific behaviors that could be improved.    Nutrition Goals Re-Evaluation:  Nutrition Goals Re-Evaluation     Row Name 06/17/24 0947 07/15/24 1043 08/07/24 1123 09/13/24 0951       Goals   Nutrition Goal Loose weight. Maintain weight. Short: Continue to attend rehab. Long: Follow the cancer center's diet, yet still following a heart healthy diet. Short; Increase protein Long: Continue to make sure he gets enough calories    Comment Dave Williams states he wants his goal weight to be 210-215 lbs. He is currently at 230 lbs. He states he eats lots of meat, tries to incorporate veggies and fruits. His water intake is adequate at 4-5 bottles per day, and he is an all day coffee drinker. Will drink 3 cups of caffeinated coffee and the rest of the day he will do de-caf. Dave Williams states that his diet is currently ok. The cancer center told him he needed to try and gain some weight because once he starts treatments it till likely make him sick and loose weight. Dave Williams is prepping to do a mostly liquid diet for a bit as he under goes cancer treatments.  He has his smoothie machine ready. They are going to pull his teeth which will also inhibit his eating. We talked about the importance of making sure he gets enough protein to avoid caexia as well. Dave Williams is getting ready to start his cancer treatments with portacath being placed on 09/25/24. He is still trying to gain weight in prep for cancer treatments.  He is trying to get the protein in.  We talked about making sure he gets  calories in once he starts treatments.  He tries to get protein and vegetables.  He is sticking to fresh or frozen veggies.    Expected Outcome Short: Incorporate a more colorful diet, (fruits and veggies). Long: Cut down on the caffeine. Short: Continue to attend rehab. Long: Follow the cancer center's diet, yet still following a heart healthy diet. Short; Increase protein Long: Continue to make sure he gets enough calories Short: Make sure to eat even with treatments LOng: Conitnue to focus on healthy eating       Nutrition Goals Discharge (Final Nutrition Goals Re-Evaluation):  Nutrition Goals Re-Evaluation - 09/13/24 0951       Goals   Nutrition Goal Short; Increase protein Long: Continue to make sure he gets enough calories    Comment Dave Williams is getting ready to start his cancer treatments with portacath being placed on 09/25/24. He is still trying to gain weight in prep for cancer treatments.  He is trying to get the protein in.  We talked about making sure he gets calories in once he starts treatments.  He tries to get protein and vegetables.  He is sticking to fresh or frozen veggies.    Expected Outcome Short: Make sure to eat even with treatments LOng: Conitnue to focus on healthy eating          Psychosocial: Target Goals: Acknowledge presence or absence of significant depression and/or stress, maximize coping skills, provide positive support system. Participant is able to verbalize types and ability to use techniques and skills needed for reducing stress and depression.  Initial Review & Psychosocial Screening:  Initial Psych Review & Screening - 05/16/24 0944  Initial Review   Current issues with Current Sleep Concerns      Family Dynamics   Good Support System? Yes      Barriers   Psychosocial barriers to participate in program The patient should benefit from training in stress management and relaxation.;There are no identifiable barriers or psychosocial needs.       Screening Interventions   Interventions To provide support and resources with identified psychosocial needs;Encouraged to exercise;Provide feedback about the scores to participant    Expected Outcomes Short Term goal: Utilizing psychosocial counselor, staff and physician to assist with identification of specific Stressors or current issues interfering with healing process. Setting desired goal for each stressor or current issue identified.;Long Term Goal: Stressors or current issues are controlled or eliminated.;Short Term goal: Identification and review with participant of any Quality of Life or Depression concerns found by scoring the questionnaire.;Long Term goal: The participant improves quality of Life and PHQ9 Scores as seen by post scores and/or verbalization of changes          Quality of Life Scores:  Quality of Life - 05/20/24 1123       Quality of Life   Select Quality of Life      Quality of Life Scores   Health/Function Pre 19.2 %    Socioeconomic Pre 26.57 %    Psych/Spiritual Pre 12.93 %    Family Pre 27.5 %    GLOBAL Pre 20.03 %         Scores of 19 and below usually indicate a poorer quality of life in these areas.  A difference of  2-3 points is a clinically meaningful difference.  A difference of 2-3 points in the total score of the Quality of Life Index has been associated with significant improvement in overall quality of life, self-image, physical symptoms, and general health in studies assessing change in quality of life.  PHQ-9: Review Flowsheet       08/07/2024 05/20/2024  Depression screen PHQ 2/9  Decreased Interest 1 1  Down, Depressed, Hopeless 1 1  PHQ - 2 Score 2 2  Altered sleeping 1 2  Tired, decreased energy 1 3  Change in appetite 0 0  Feeling bad or failure about yourself  0 1  Trouble concentrating 1 0  Moving slowly or fidgety/restless 0 1  Suicidal thoughts 0 0  PHQ-9 Score 5 9  Difficult doing work/chores Not difficult at all Not  difficult at all   Interpretation of Total Score  Total Score Depression Severity:  1-4 = Minimal depression, 5-9 = Mild depression, 10-14 = Moderate depression, 15-19 = Moderately severe depression, 20-27 = Severe depression   Psychosocial Evaluation and Intervention:  Psychosocial Evaluation - 05/16/24 0945       Psychosocial Evaluation & Interventions   Interventions Stress management education;Relaxation education;Encouraged to exercise with the program and follow exercise prescription    Comments 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 He denies any depression, anxiety or stressors. He does report some difficulty staying asleep and has been taking Advil PM long term for sleep. He lives alone. He says he has friends and neigbors that would help him out if needed. 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 He has a $25 copayment and is about to meet his out of pocket maximun which will end the copayment. He is only going to do 1 day/week until he meets the OOP and then go to  3 days/week. His goals for the program are to get his stamina back; improve his muscle tone and get back in shape overall. He has no barriers identified to complete the program.    Expected Outcomes Short Term: Patient will start the program and attend consistently. Long Term: Patient will complete the program meeting personal goals.    Continue Psychosocial Services  Follow up required by staff          Psychosocial Re-Evaluation:  Psychosocial Re-Evaluation     Row Name 06/17/24 0946 07/15/24 1034 08/07/24 1119 09/13/24 0950       Psychosocial Re-Evaluation   Current issues with None Identified Current Stress Concerns Current Stress Concerns;Current Sleep Concerns Current Stress Concerns;Current Sleep Concerns    Comments Dave Williams identifies no stressors in his life at the moment. 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 Dave Williams is currently stressed out and anxious about his cancer issues going on. He has his biopsy next week, so once they do that he will have a better idea of what the treatment options are and how long they will be. Dave Williams is getting set up to start his cancer treatments.  He sees oncologist today to talk about chemo.  They are planning to do 7 weeks of radiation and chemo.  He has declined sugery.  He needs to have his teeth removed prior to treatments.  He is ready to face it head on as he says his alternative is to die.  He wants to take it on.  He does have a strong family history of cancer.  He has not been sleeping well with all of this going on.  Last night the anxiety triggered chest pain and his NTG did help with relief.  We talked about signs to be aware of and to watch for his fatigue.  He feels he is ready and will keep us  posted on how he is doing. Dave Williams is having his teeth pulled next week. He is also getting fitted for his cage on 10/13 and portacath placed on 10/15.  He is gearing up mentally for the treatments. He has been fighting with some pain in his throat leading up to treatments.  He is still not sleeping well.  He tries to deal with it the best he can.    Expected Outcomes Short: Continue to attend rehab. Long: Continue to have a stress free lifestyle. Short: Continue to attend rehab. Long: Find stress outlets to help him with the anxiety he is experiencing. Short; Continue to attend rehab for mental boost Long: COntinue to cope with cancer treatments Short: Stay positive through treatments Long: Conitnue to rest when he can    Interventions Encouraged to attend Cardiac Rehabilitation for the exercise Encouraged to attend Cardiac Rehabilitation for the exercise;Stress management education;Relaxation education Encouraged to attend Cardiac Rehabilitation for the exercise Encouraged to attend Cardiac Rehabilitation for the exercise     Continue Psychosocial Services  Follow up required by staff Follow up required by staff Follow up required by staff Follow up required by staff       Psychosocial Discharge (Final Psychosocial Re-Evaluation):  Psychosocial Re-Evaluation - 09/13/24 0950       Psychosocial Re-Evaluation   Current issues with Current Stress Concerns;Current Sleep Concerns    Comments Dave Williams is having his teeth pulled next week. He is also getting fitted for  his cage on 10/13 and portacath placed on 10/15.  He is gearing up mentally for the treatments. He has been fighting with some pain in his throat leading up to treatments.  He is still not sleeping well.  He tries to deal with it the best he can.    Expected Outcomes Short: Stay positive through treatments Long: Conitnue to rest when he can    Interventions Encouraged to attend Cardiac Rehabilitation for the exercise    Continue Psychosocial Services  Follow up required by staff          Vocational Rehabilitation: Provide vocational rehab assistance to qualifying candidates.   Vocational Rehab Evaluation & Intervention:  Vocational Rehab - 05/16/24 9061       Initial Vocational Rehab Evaluation & Intervention   Assessment shows need for Vocational Rehabilitation No      Vocational Rehab Re-Evaulation   Comments Disabled.          Education: Education Goals: Education classes will be provided on a weekly basis, covering required topics. Participant will state understanding/return demonstration of topics presented.  Learning Barriers/Preferences:  Learning Barriers/Preferences - 05/16/24 9061       Learning Barriers/Preferences   Learning Barriers None    Learning Preferences Written Material;Audio;Skilled Demonstration          Education Topics: Hypertension, Hypertension Reduction -Define heart disease and high blood pressure. Discus how high blood pressure affects the body and ways to reduce high blood pressure.   Exercise  and Your Heart -Discuss why it is important to exercise, the FITT principles of exercise, normal and abnormal responses to exercise, and how to exercise safely.   Angina -Discuss definition of angina, causes of angina, treatment of angina, and how to decrease risk of having angina. Flowsheet Row CARDIAC REHAB PHASE II EXERCISE from 07/17/2024 in [place] [facility]  Date 05/22/24  Educator [facility]  Instruction Review Code 1- Verbalizes Understanding    Cardiac Medications -Review what the following cardiac medications are used for, how they affect the body, and side effects that may occur when taking the medications.  Medications include Aspirin , Beta blockers, calcium  channel blockers, ACE Inhibitors, angiotensin receptor blockers, diuretics, digoxin, and antihyperlipidemics. Flowsheet Row CARDIAC REHAB PHASE II EXERCISE from 07/17/2024 in [place] [facility]  Date 07/10/24  Educator [facility]  Instruction Review Code 1- Verbalizes Understanding    Congestive Heart Failure -Discuss the definition of CHF, how to live with CHF, the signs and symptoms of CHF, and how keep track of weight and sodium intake.   Heart Disease and Intimacy -Discus the effect sexual activity has on the heart, how changes occur during intimacy as we age, and safety during sexual activity. Flowsheet Row CARDIAC REHAB PHASE II EXERCISE from 07/17/2024 in [place] [facility]  Date 06/12/24  Educator [facility]  Instruction Review Code 1- Verbalizes Understanding    Smoking Cessation / COPD -Discuss different methods to quit smoking, the health benefits of quitting smoking, and the definition of COPD. Flowsheet Row CARDIAC REHAB PHASE II EXERCISE from 07/17/2024 in [place] [facility]  Date 07/17/24  Educator [facility]  Instruction Review Code 1- Verbalizes Understanding    Nutrition I: Fats -Discuss the types of cholesterol, what cholesterol does to the heart, and how cholesterol  levels can be controlled.   Nutrition II: Labels -Discuss the different components of food labels and how to read food label   Heart Parts/Heart Disease and PAD -Discuss the anatomy of the heart, the pathway  of blood circulation through the heart, and these are affected by heart disease.   Stress I: Signs and Symptoms -Discuss the causes of stress, how stress may lead to anxiety and depression, and ways to limit stress.   Stress II: Relaxation -Discuss different types of relaxation techniques to limit stress.   Warning Signs of Stroke / TIA -Discuss definition of a stroke, what the signs and symptoms are of a stroke, and how to identify when someone is having stroke.   Knowledge Questionnaire Score:  Knowledge Questionnaire Score - 05/20/24 1043       Knowledge Questionnaire Score   Pre Score 25/28          Core Components/Risk Factors/Patient Goals at Admission:  Personal Goals and Risk Factors at Admission - 05/20/24 1123       Core Components/Risk Factors/Patient Goals on Admission    Weight Management Weight Loss;Yes    Intervention Weight Management: Develop a combined nutrition and exercise program designed to reach desired caloric intake, while maintaining appropriate intake of nutrient and fiber, sodium and fats, and appropriate energy expenditure required for the weight goal.;Weight Management: Provide education and appropriate resources to help participant work on and attain dietary goals.;Weight Management/Obesity: Establish reasonable short term and long term weight goals.    Admit Weight 226 lb 8 oz (102.7 kg)    Goal Weight: Short Term 221 lb (100.2 kg)    Goal Weight: Long Term 216 lb (98 kg)    Expected Outcomes Short Term: Continue to assess and modify interventions until short term weight is achieved;Long Term: Adherence to nutrition and physical activity/exercise program aimed toward attainment of established weight goal;Weight Loss: Understanding of  general recommendations for a balanced deficit meal plan, which promotes 1-2 lb weight loss per week and includes a negative energy balance of [phone] kcal/d;Understanding recommendations for meals to include 15-35% energy as protein, 25-35% energy from fat, 35-60% energy from carbohydrates, less than 200mg  of dietary cholesterol, 20-35 gm of total fiber daily;Understanding of distribution of calorie intake throughout the day with the consumption of 4-5 meals/snacks    Improve shortness of breath with ADL's Yes    Intervention Provide education, individualized exercise plan and daily activity instruction to help decrease symptoms of SOB with activities of daily living.    Expected Outcomes Short Term: Improve cardiorespiratory fitness to achieve a reduction of symptoms when performing ADLs;Long Term: Be able to perform more ADLs without symptoms or delay the onset of symptoms    Hypertension Yes    Intervention Provide education on lifestyle modifcations including regular physical activity/exercise, weight management, moderate sodium restriction and increased consumption of fresh fruit, vegetables, and low fat dairy, alcohol moderation, and smoking cessation.;Monitor prescription use compliance.    Expected Outcomes Short Term: Continued assessment and intervention until BP is < 140/62mm HG in hypertensive participants. < 130/73mm HG in hypertensive participants with diabetes, heart failure or chronic kidney disease.;Long Term: Maintenance of blood pressure at goal levels.    Lipids Yes    Intervention Provide education and support for participant on nutrition & aerobic/resistive exercise along with prescribed medications to achieve LDL 70mg , HDL >40mg .    Expected Outcomes Short Term: Participant states understanding of desired cholesterol values and is compliant with medications prescribed. Participant is following exercise prescription and nutrition guidelines.;Long Term: Cholesterol controlled with  medications as prescribed, with individualized exercise RX and with personalized nutrition plan. Value goals: LDL < 70mg , HDL > 40 mg.  Core Components/Risk Factors/Patient Goals Review:   Goals and Risk Factor Review     Row Name 06/17/24 0949 07/15/24 1044 08/07/24 1124 09/13/24 0954       Core Components/Risk Factors/Patient Goals Review   Personal Goals Review Weight Management/Obesity;Lipids Weight Management/Obesity;Lipids;Hypertension Weight Management/Obesity;Lipids;Hypertension Weight Management/Obesity;Lipids;Hypertension    Review Dave Williams states he checks all his vitals regularly at home. His BP runs good, usually only 4-10 points off of what we get. His pulse ox runs about 95-97% which is good for someone who used to be a heavy smoker. Takes all medications as prescribed. Dave Williams states that he checks his BP 3-4 times a day. He also checks his pulse oximetry at home which it runs 96-97%, which he considers good since he smoked for 40 years. He takes all his medications as prescribed. Dave Williams is doing well in rehab.  He contineus to keep eye on blood pressre and encourage to really watch it as he starts up his cancer treatments. He is keeping eye on his weight and trying to gain a little bulk prior to starting treatments.  He was encouraged to talk with his oncologist to make sure that there are no medication interactions prior to starting his chemo. Dave Williams has been working on weight gain to prep for cancer treatments that start this month.  His pressures have been running a little on the higher side with all the extra stress. He has also been having a chronic headache. He has tried a variety of things to help.  Sleep helps some.    Expected Outcomes Short: Continue to attend rehab. Long: Continue checking vitals at home. Short: Continue to attend rehab. Long: Continue checking vitals at home. Short; Contineu to keep eye on blood pressure lOng: Continue to montior symptoms and weight Short;  Continue to keep eye on blood pressures Long Continue to monitor risk factors       Core Components/Risk Factors/Patient Goals at Discharge (Final Review):   Goals and Risk Factor Review - 09/13/24 0954       Core Components/Risk Factors/Patient Goals Review   Personal Goals Review Weight Management/Obesity;Lipids;Hypertension    Review Dave Williams has been working on weight gain to prep for cancer treatments that start this month.  His pressures have been running a little on the higher side with all the extra stress. He has also been having a chronic headache. He has tried a variety of things to help.  Sleep helps some.    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 On-site orientation visit scheduled for 05/20/24 at 10. Patient arrived for 1st visit/orientation/education at 1000. Patient was referred to CR by Dr. Linnie Rayas and attending cardiologist Peter [place] due to S/P CABGx3. During orientation advised patient on arrival and appointment times what to wear, what to do before, during and after exercise. Reviewed attendance and class policy.  Pt is scheduled to return Cardiac Rehab on 05/22/24 at 915. Pt was advised to come to class 15 minutes before class starts.  Discussed RPE/Dpysnea scales. Patient participated in warm up stretches. Patient was able to complete 6 minute walk test.  Telemetry:NSR. Patient was measured for the equipment. Discussed equipment safety with patient. Took patient pre-anthropometric  measurements. Patient finished visit at 1045. First full day of exercise!  Patient was oriented to gym and equipment including functions, settings, policies, and procedures.  Patient's individual exercise prescription and  treatment plan were reviewed.  All starting workloads were established based on the results of the 6 minute walk test done at initial orientation visit.  The plan for exercise progression was also introduced and progression will be customized based on patient's performance and goals. 30 day review completed. ITP sent to Dr.Jonathan Branch, Medical Director of Cardiac Rehab. Continue with ITP unless changes are made by physician. New to program. He has completed 1 session since his orientation visit. He is only attending 1 day/week due to his co-payment. Patient will be out rest of week for oncology work up and biopsy.    Row Name 06/26/24 0821 07/24/24 0951 08/09/24 0935 08/21/24 1417 09/13/24 0932   ITP Comments 30 day review completed. ITP sent to Dr. Dorn Ross, Medical Director of Cardiac Rehab. Continue with ITP unless changes are made by physician. 30 day review completed. ITP sent to Dr. Dorn Ross, Medical Director of Cardiac Rehab. Continue with ITP unless changes are made by physician.  Dave Williams is out this week with MD appt Monday and biopsy surgery today. 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 30 day review completed. ITP sent to Dr. Dorn Ross, Medical Director of Cardiac Rehab. Continue with ITP unless changes are made by physician. Dave Williams is having his teeth pulled next week.  He is also getting fitted for his cage on 10/13 and portacath placed on 10/15.    Row Name 09/18/24 0846           ITP Comments 30 day review completed. ITP sent to Dr. Dorn Ross, Medical Director of Cardiac Rehab. Continue with ITP unless changes are made by physician.          Comments: 30 day review

## 2024-09-18 NOTE — Telephone Encounter (Signed)
 Followed up with patient's voicemail requesting rx for Valium . Per Dr. Autumn, patient was advised to call his PCP for his request.   Patient understood instructions.

## 2024-09-20 ENCOUNTER — Other Ambulatory Visit (HOSPITAL_COMMUNITY)
Admission: RE | Admit: 2024-09-20 | Discharge: 2024-09-20 | Disposition: A | Discharge status: Home / Self Care | Source: Ambulatory Visit | Attending: Radiation Oncology | Admitting: Radiation Oncology

## 2024-09-20 ENCOUNTER — Encounter (HOSPITAL_COMMUNITY)

## 2024-09-20 DIAGNOSIS — C12 Malignant neoplasm of pyriform sinus: Secondary | ICD-10-CM | POA: Insufficient documentation

## 2024-09-20 LAB — BASIC METABOLIC PANEL WITH GFR
Anion gap: 14 (ref 5–15)
BUN: 20 mg/dL (ref 8–23)
CO2: 25 mmol/L (ref 22–32)
Calcium: 9.8 mg/dL (ref 8.9–10.3)
Chloride: 103 mmol/L (ref 98–111)
Creatinine, Ser: 1.21 mg/dL (ref 0.61–1.24)
GFR, Estimated: >60 mL/min (ref >60)
Glucose, Bld: 113 mg/dL — ABNORMAL HIGH (ref 70–99)
Potassium: 4.1 mmol/L (ref 3.5–5.1)
Sodium: 142 mmol/L (ref 135–145)

## 2024-09-23 ENCOUNTER — Ambulatory Visit
Admission: RE | Admit: 2024-09-23 | Discharge: 2024-09-23 | Disposition: A | Discharge status: Home / Self Care | Source: Ambulatory Visit | Attending: Radiation Oncology | Admitting: Radiation Oncology

## 2024-09-23 ENCOUNTER — Other Ambulatory Visit: Payer: Self-pay | Admitting: Oncology

## 2024-09-23 ENCOUNTER — Encounter (HOSPITAL_COMMUNITY): Payer: Self-pay

## 2024-09-23 VITALS — BP 125/79 | HR 84 | Temp 98.8°F | Resp 18 | Wt 239.2 lb

## 2024-09-23 DIAGNOSIS — Z5111 Encounter for antineoplastic chemotherapy: Secondary | ICD-10-CM | POA: Diagnosis not present

## 2024-09-23 DIAGNOSIS — R519 Headache, unspecified: Secondary | ICD-10-CM | POA: Insufficient documentation

## 2024-09-23 DIAGNOSIS — Z79891 Long term (current) use of opiate analgesic: Secondary | ICD-10-CM | POA: Insufficient documentation

## 2024-09-23 DIAGNOSIS — Z510 Encounter for antineoplastic radiation therapy: Secondary | ICD-10-CM | POA: Insufficient documentation

## 2024-09-23 DIAGNOSIS — C12 Malignant neoplasm of pyriform sinus: Secondary | ICD-10-CM | POA: Insufficient documentation

## 2024-09-23 DIAGNOSIS — J392 Other diseases of pharynx: Secondary | ICD-10-CM

## 2024-09-23 DIAGNOSIS — Z808 Family history of malignant neoplasm of other organs or systems: Secondary | ICD-10-CM | POA: Insufficient documentation

## 2024-09-23 DIAGNOSIS — M542 Cervicalgia: Secondary | ICD-10-CM | POA: Insufficient documentation

## 2024-09-23 DIAGNOSIS — G893 Neoplasm related pain (acute) (chronic): Secondary | ICD-10-CM | POA: Diagnosis not present

## 2024-09-23 DIAGNOSIS — Z951 Presence of aortocoronary bypass graft: Secondary | ICD-10-CM

## 2024-09-23 NOTE — Progress Notes (Signed)
 Documented graduation test

## 2024-09-23 NOTE — Progress Notes (Signed)
 Oncology Nurse Navigator Documentation   To provide support, encouragement and care continuity, met with Dave Williams before and after his CT SIM. He tolerated procedure without difficulty, denied questions/concerns. I have reminded him of his PAC placement on 09/25/24 and he is aware.  I encouraged him to call me prior to 10/03/24 [facility].   [name] RN, BSN, OCN Head & Neck Oncology Nurse Navigator [facility] Phone # [phone]  Fax # [phone]

## 2024-09-24 ENCOUNTER — Other Ambulatory Visit: Payer: Self-pay

## 2024-09-24 NOTE — H&P (Signed)
 Chief Complaint: Patient was seen in consultation today for squamous cell cancer of the hypopharynx, with consideration for Port-A-Cath placement for chemotherapy administration.  Referring Provider(s): Dr. Chinita Patten, MD   Supervising Physician: Jenna Hacker  Patient Status: [facility] - Out-pt  Patient is Full Code  History of Present Illness: Dave Williams is a 68 y.o. male  with PMHx notable for cancer of piriform sinus (hypopharynx), HTN, CVA, CAD, and others as delineated below.  Per Dr. Chancy progress note dated 8/27: Cancer of pyriform sinus [facility]) Please review oncology history for additional details and timeline of events.   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  Patient declined surgical option.  Hence plan made to proceed with concurrent chemoradiation.  Renal function is fluctuating and this needs to be monitored closely.    The cancer has a high risk of recurrence, necessitating aggressive treatment.   [...]Patient is willing to proceed with weekly cisplatin.   - Use cisplatin as the preferred chemotherapy agent, contingent on adequate kidney function due to nephrotoxicity.  Other options include weekly docetaxel or weekly carboplatin and paclitaxel, if kidney function does not permit cisplatin use.   - Arrange for port-a-cath placement.  Patient declined feeding tube placement at this time.   Interventional Radiology was requested for Port-A-Cath placement. Patient is scheduled for same in IR today.   Patient is alert and laying in bed, calm.  Patient is currently without any significant complaints. He did take a Nitroglycerine tab last night due to high blood pressure. Patient is an EMT. Black coffee this morning at 8 am. Patient denies any fevers, headache, chest pain, SOB, cough, abdominal pain, nausea, vomiting or bleeding.    Past Medical History:  Diagnosis Date   Anxiety    Chronic left hip  pain    Compression fracture of spine (HCC)    multiple compression Fx with disc bulge   Coronary artery disease    Hypertension    Pneumonia    As a teenager   Stroke [facility]) 2022   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    Past Surgical History:  Procedure Laterality Date   BACK SURGERY     Radiocartography T2-T12   CORONARY ARTERY BYPASS GRAFT N/A 03/04/2024   Procedure: CORONARY ARTERY BYPASS GRAFTING (CABG) TIMES THREE UTILIZING LEFT INTERNAL MAMMARY ARTERY AND ENDOSCOPIC VEIN HARVEST RIGHT GREATER SAPHENOUS VEIN;  Surgeon: Shyrl Linnie KIDD, MD;  Location: [facility] OR;  Service: Open Heart Surgery;  Laterality: N/A;   DIRECT LARYNGOSCOPY N/A 07/24/2024   Procedure: LARYNGOSCOPY, DIRECT;  Surgeon: Tobie Eldora NOVAK, MD;  Location: [facility] OR;  Service: ENT;  Laterality: N/A;  Direct Laryngoscopy with biopsy with use of operating telescope   KNEE ARTHROSCOPY Bilateral    LEFT HEART CATH AND CORONARY ANGIOGRAPHY N/A 11/28/2023   Procedure: LEFT HEART CATH AND CORONARY ANGIOGRAPHY;  Surgeon: Swazil[place], Peter M, MD;  Location: [facility] INVASIVE CV LAB;  Service: Cardiovascular;  Laterality: N/A;   PILONIDAL CYST EXCISION     In patients 20s   RIGID ESOPHAGOSCOPY N/A 07/24/2024   Procedure: ESOPHAGOSCOPY, RIGID;  Surgeon: Tobie Eldora NOVAK, MD;  Location: [facility] OR;  Service: ENT;  Laterality: N/A;   TEE WITHOUT CARDIOVERSION N/A 03/04/2024   Procedure: ECHOCARDIOGRAM, TRANSESOPHAGEAL;  Surgeon: Shyrl Linnie KIDD, MD;  Location: [facility] OR;  Service: Open Heart Surgery;  Laterality: N/A;  Allergies: Patient has no known allergies.  Medications: Prior to Admission medications   Medication Sig Start Date End Date Taking? Authorizing Provider  acetaminophen  (TYLENOL ) 500 MG tablet Take 1,000 mg by mouth every 6 (six) hours as needed (pain.).    [provider]  amLODipine  (NORVASC ) 10 MG tablet Take 1 tablet (10 mg total) by mouth daily. 04/10/24    Rana Lum CROME, NP  aspirin  EC 81 MG tablet Take 1 tablet (81 mg total) by mouth daily. Swallow whole. 07/26/24   Tobie Eldora NOVAK, MD  atorvastatin  (LIPITOR ) 80 MG tablet Take 1 tablet (80 mg total) by mouth daily with supper. 04/22/24   Court Dorn PARAS, MD  dexamethasone  (DECADRON ) 4 MG tablet Take 2 tablets (8 mg) by mouth daily x 3 days starting the day after cisplatin chemotherapy. Take with food. 08/30/24   Pasam, Chinita, MD  HYDROcodone -acetaminophen  (NORCO) 7.5-325 MG tablet Take 1 tablet by mouth every 6 (six) hours as needed for moderate pain (pain score 4-6). 09/04/24   Pasam, Chinita, MD  hydrOXYzine  (VISTARIL ) 25 MG capsule Take 1 capsule (25 mg total) by mouth every 8 (eight) hours as needed. 07/09/24   Tobie Eldora NOVAK, MD  isosorbide  mononitrate (IMDUR ) 30 MG 24 hr tablet Take 1 tablet (30 mg total) by mouth daily. 04/10/24   Rana Lum CROME, NP  lidocaine -prilocaine  (EMLA ) cream Apply to affected area once 08/30/24   Pasam, Avinash, MD  nitroGLYCERIN  (NITROSTAT ) 0.4 MG SL tablet Place 1 tablet (0.4 mg total) under the tongue every 5 (five) minutes as needed for chest pain. DISSOLVE 1 TABLET UNDER THE TONGUE EVERY 5 MINUTES AS NEEDED FOR CHEST PAIN. DO NOT EXCEED A TOTAL OF 3 DOSES IN 15 MINUTES. 04/10/24   Fountain, Lum L, NP  ondansetron  (ZOFRAN ) 8 MG tablet Take 1 tablet (8 mg total) by mouth every 8 (eight) hours as needed for nausea or vomiting. Start on the third day after cisplatin. 08/30/24   Pasam, Chinita, MD  pantoprazole  (PROTONIX ) 40 MG tablet Take 1 tablet (40 mg total) by mouth daily. 03/21/24   Love, Sharlet RAMAN, PA-C  prochlorperazine  (COMPAZINE ) 10 MG tablet Take 1 tablet (10 mg total) by mouth every 6 (six) hours as needed (Nausea or vomiting). 08/30/24   Pasam, Avinash, MD  vitamin D3 (CHOLECALCIFEROL ) 25 MCG tablet Take 1 tablet (1,000 Units total) by mouth daily. 03/21/24   Love, Sharlet RAMAN, PA-C     Family History  Problem Relation Age of Onset   Cancer Mother     Hypertension Mother    Heart failure Mother    Throat cancer Father        Throat cancer mets to spine   Hypertension Father    Heart failure Father    Heart disease Father    Thyroid  cancer Sister    Skin cancer Maternal Aunt     Social History   Socioeconomic History   Marital status: Single    Spouse name: Not on file   Number of children: Not on file   Years of education: Not on file   Highest education level: Not on file  Occupational History   Not on file  Tobacco Use   Smoking status: Former    Current packs/day: 0.50    Types: Cigarettes   Smokeless tobacco: Never  Vaping Use   Vaping status: Never Used  Substance and Sexual Activity   Alcohol use: No    Comment: Sober for 20 yeras as of 02/2024  Drug use: Not Currently    Types: Crack cocaine, Marijuana    Comment: Used for 30+ years but quit in 1990s and has not picked up since   Sexual activity: Yes  Other Topics Concern   Not on file  Social History Narrative   Not on file   Social Drivers of Health   Financial Resource Strain: Not on file  Food Insecurity: No Food Insecurity (08/07/2024)   Hunger Vital Sign    Worried About Running Out of Food in the Last Year: Never true    Ran Out of Food in the Last Year: Never true  Transportation Needs: No Transportation Needs (08/07/2024)   PRAPARE - Administrator, Civil Service (Medical): No    Lack of Transportation (Non-Medical): No  Physical Activity: Not on file  Stress: Not on file  Social Connections: Moderately Integrated (03/05/2024)   Social Connection and Isolation Panel    Frequency of Communication with Friends and Family: More than three times a week    Frequency of Social Gatherings with Friends and Family: More than three times a week    Attends Religious Services: More than 4 times per year    Active Member of [employer] or Organizations: Yes    Attends Banker Meetings: 1 to 4 times per year    Marital Status:  Divorced     Review of Systems: A 12 point ROS discussed and pertinent positives are indicated in the HPI above.  All other systems are negative.  Vital Signs: BP 123/85 (BP Location: Right Arm)   Pulse 76   Temp 97.8 F (36.6 C) (Oral)   Resp 18   Ht 6' 1 (1.854 m)   Wt 240 lb (108.9 kg)   SpO2 97%   BMI 31.66 kg/m   Advance Care Plan: The advanced care place/surrogate decision maker was discussed at the time of visit and the patient did not wish to discuss or was not able to name a surrogate decision maker or provide an advance care plan.  Physical Exam Vitals reviewed.  Constitutional:      General: He is not in acute distress.    Appearance: Normal appearance.  HENT:     Mouth/Throat:     Mouth: Mucous membranes are dry.  Cardiovascular:     Rate and Rhythm: Normal rate and regular rhythm.  Pulmonary:     Effort: Pulmonary effort is normal.     Breath sounds: Normal breath sounds.  Musculoskeletal:        General: Normal range of motion.     Cervical back: Normal range of motion.  Skin:    General: Skin is warm and dry.  Neurological:     Mental Status: He is alert and oriented to person, place, and time.  Psychiatric:        Mood and Affect: Mood normal.        Behavior: Behavior normal.        Thought Content: Thought content normal.        Judgment: Judgment normal.     Imaging: No results found.  Labs:  CBC: Recent Labs    03/21/24 0530 06/19/24 1353 07/24/24 1304 08/07/24 1440  WBC 11.6* 8.7 10.7* 9.4  HGB 12.1* 14.3 13.3 13.4  HCT 36.1* 43.0 41.7 40.5  PLT 600* 265 258 270    COAGS: Recent Labs    02/29/24 1053 03/04/24 1253  INR 1.0 1.3*  APTT 34 37*    BMP: Recent  Labs    06/19/24 1353 07/24/24 1304 08/07/24 1440 09/20/24 1051  NA 137 138 139 142  K 4.2 4.2 4.3 4.1  CL 102 106 103 103  CO2 27 21* 30 25  GLUCOSE 101* 99 93 113*  BUN 19 17 14 20   CALCIUM  10.1 9.2 9.9 9.8  CREATININE 1.34* 1.28* 1.31* 1.21  GFRNONAA  58* >60 >60 >60    LIVER FUNCTION TESTS: Recent Labs    02/29/24 1053 03/14/24 0624 06/19/24 1353 08/07/24 1440  BILITOT 0.9 1.1 0.9 1.1  AST 36 28 20 16   ALT 37 33 27 14  ALKPHOS 40 49 61 49  PROT 7.4 6.9 8.3* 7.8  ALBUMIN  4.3 3.0* 4.7 4.6    TUMOR MARKERS: No results for input(s): AFPTM, CEA, CA199, CHROMGRNA in the last 8760 hours.  Assessment and Plan: Per Dr. Chancy progress note dated 8/27: Cancer of pyriform sinus [facility]) Please review oncology history for additional details and timeline of events.   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  Patient declined surgical option.  Hence plan made to proceed with concurrent chemoradiation.  Renal function is fluctuating and this needs to be monitored closely.    The cancer has a high risk of recurrence, necessitating aggressive treatment.   [...]Patient is willing to proceed with weekly cisplatin.   - Use cisplatin as the preferred chemotherapy agent, contingent on adequate kidney function due to nephrotoxicity.  Other options include weekly docetaxel or weekly carboplatin and paclitaxel, if kidney function does not permit cisplatin use.   - Arrange for port-a-cath placement.  Patient declined feeding tube placement at this time.  Patient presents for scheduled Port-A-Cath placement in IR today.  Patient has been NPO since midnight.  All labs and medications are within acceptable parameters.  No known allergies on file.  Risks and benefits of image guided port-a-catheter placement was discussed with the patient including, but not limited to bleeding, infection, pneumothorax, or fibrin sheath development and need for additional procedures.  All of the patient's questions were answered, patient is agreeable to proceed. Consent signed and in chart.    Thank you for allowing our service to participate in Brooks Kinnan 's care.  Electronically Signed: Carlin DELENA Griffon, PA-C   09/25/2024, 1:47 PM      I spent a total of 40 Minutes in face to face in clinical consultation, greater than 50% of which was counseling/coordinating care for squamous cell cancer of the hypopharynx, with consideration for Port-A-Cath placement for chemotherapy administration.

## 2024-09-24 NOTE — Progress Notes (Signed)
 Pharmacist Chemotherapy Monitoring - Initial Assessment    Anticipated start date: 10/03/24   The following has been reviewed per standard work regarding the patient's treatment regimen: The patient's diagnosis, treatment plan and drug doses, and organ/hematologic function Lab orders and baseline tests specific to treatment regimen  The treatment plan start date, drug sequencing, and pre-medications Prior authorization status  Patient's documented medication list, including drug-drug interaction screen and prescriptions for anti-emetics and supportive care specific to the treatment regimen The drug concentrations, fluid compatibility, administration routes, and timing of the medications to be used The patient's access for treatment and lifetime cumulative dose history, if applicable  The patient's medication allergies and previous infusion related reactions, if applicable   Changes made to treatment plan:  N/A  Follow up needed:  Port placement   Candy Glatter, PharmD, MBA

## 2024-09-25 ENCOUNTER — Other Ambulatory Visit: Payer: Self-pay

## 2024-09-25 ENCOUNTER — Ambulatory Visit (HOSPITAL_COMMUNITY)
Admission: RE | Admit: 2024-09-25 | Discharge: 2024-09-25 | Disposition: A | Discharge status: Home / Self Care | Source: Ambulatory Visit | Attending: Oncology | Admitting: Oncology

## 2024-09-25 ENCOUNTER — Ambulatory Visit (HOSPITAL_COMMUNITY)

## 2024-09-25 ENCOUNTER — Encounter (HOSPITAL_COMMUNITY): Payer: Self-pay

## 2024-09-25 DIAGNOSIS — Z79899 Other long term (current) drug therapy: Secondary | ICD-10-CM | POA: Diagnosis not present

## 2024-09-25 DIAGNOSIS — I2510 Atherosclerotic heart disease of native coronary artery without angina pectoris: Secondary | ICD-10-CM | POA: Insufficient documentation

## 2024-09-25 DIAGNOSIS — Z87891 Personal history of nicotine dependence: Secondary | ICD-10-CM | POA: Insufficient documentation

## 2024-09-25 DIAGNOSIS — C139 Malignant neoplasm of hypopharynx, unspecified: Secondary | ICD-10-CM | POA: Diagnosis present

## 2024-09-25 DIAGNOSIS — Z8673 Personal history of transient ischemic attack (TIA), and cerebral infarction without residual deficits: Secondary | ICD-10-CM | POA: Insufficient documentation

## 2024-09-25 DIAGNOSIS — I10 Essential (primary) hypertension: Secondary | ICD-10-CM | POA: Diagnosis not present

## 2024-09-25 HISTORY — PX: IR IMAGING GUIDED PORT INSERTION: IMG5740

## 2024-09-25 MED ORDER — MIDAZOLAM HCL 2 MG/2ML IJ SOLN
INTRAMUSCULAR | Status: AC
  Filled 2024-09-25: qty 2

## 2024-09-25 MED ORDER — SODIUM CHLORIDE 0.9 % IV SOLN: INTRAVENOUS | Status: DC

## 2024-09-25 MED ORDER — FENTANYL CITRATE (PF) 100 MCG/2ML IJ SOLN
INTRAMUSCULAR | Status: AC
  Filled 2024-09-25: qty 2

## 2024-09-25 MED ORDER — FENTANYL CITRATE (PF) 100 MCG/2ML IJ SOLN
INTRAMUSCULAR | Status: AC | PRN
  Administered 2024-09-25 (×3): 25 ug via INTRAVENOUS
  Administered 2024-09-25 (×2): 50 ug via INTRAVENOUS

## 2024-09-25 MED ORDER — LIDOCAINE HCL 1 % IJ SOLN
20.0000 mL | Freq: Once | INTRAMUSCULAR | Status: AC
  Administered 2024-09-25: 15 mL via INTRADERMAL

## 2024-09-25 MED ORDER — HEPARIN SOD (PORK) LOCK FLUSH 100 UNIT/ML IV SOLN
INTRAVENOUS | Status: AC
  Filled 2024-09-25: qty 5

## 2024-09-25 MED ORDER — LIDOCAINE HCL 1 % IJ SOLN
INTRAMUSCULAR | Status: AC
  Filled 2024-09-25: qty 20

## 2024-09-25 MED ORDER — HEPARIN SOD (PORK) LOCK FLUSH 100 UNIT/ML IV SOLN
500.0000 [IU] | Freq: Once | INTRAVENOUS | Status: AC
  Administered 2024-09-25: 500 [IU] via INTRAVENOUS

## 2024-09-25 MED ORDER — MIDAZOLAM HCL 2 MG/2ML IJ SOLN
INTRAMUSCULAR | Status: AC | PRN
  Administered 2024-09-25: 1 mg via INTRAVENOUS
  Administered 2024-09-25 (×2): .5 mg via INTRAVENOUS
  Administered 2024-09-25: 1 mg via INTRAVENOUS
  Administered 2024-09-25: .5 mg via INTRAVENOUS

## 2024-09-25 NOTE — Procedures (Signed)
 Interventional Radiology Procedure Note  Procedure: chest port placement  Complications: None  Estimated Blood Loss: < 10 mL  Findings: RIJ access for chest port placement.  Tip at cavoatrial junction  Cordella DELENA Banner, MD

## 2024-09-25 NOTE — Progress Notes (Signed)
 1515 Ice pack given for comfort to apply to right upper neck and right upper chest per instructions.

## 2024-09-25 NOTE — Discharge Instructions (Addendum)
Please call Interventional Radiology clinic [phone] with any questions or concerns.  You may remove your dressing and shower tomorrow.  DO NOT use EMLA cream on your port site for 2 weeks as this cream will remove surgical glue on your incision.    Implanted Port Insertion, Care After The following information offers guidance on how to care for yourself after your procedure. Your health care provider may also give you more specific instructions. If you have problems or questions, contact your health care provider. What can I expect after the procedure? After the procedure, it is common to have: Discomfort at the port insertion site. Bruising on the skin over the port. This should improve over 3-4 days. Follow these instructions at home: [facility] care After your port is placed, you will get a manufacturer's information card. The card has information about your port. Keep this card with you at all times. Take care of the port as told by your health care provider. Ask your health care provider if you or a family member can get training for taking care of the port at home. A home health care nurse will be be available to help care for the port. Make sure to remember what type of port you have. Incision care     Follow instructions from your health care provider about how to take care of your port insertion site. Make sure you: Wash your hands with soap and water for at least 20 seconds before and after you change your bandage (dressing). If soap and water are not available, use hand sanitizer. Change your dressing as told by your health care provider. Leave stitches (sutures), skin glue, or adhesive strips in place. These skin closures may need to stay in place for 2 weeks or longer. If adhesive strip edges start to loosen and curl up, you may trim the loose edges. Do not remove adhesive strips completely unless your health care provider tells you to do that. Check your port insertion site  every day for signs of infection. Check for: Redness, swelling, or pain. Fluid or blood. Warmth. Pus or a bad smell. Activity Return to your normal activities as told by your health care provider. Ask your health care provider what activities are safe for you. You may have to avoid lifting. Ask your health care provider how much you can safely lift. General instructions Take over-the-counter and prescription medicines only as told by your health care provider. Do not take baths, swim, or use a hot tub until your health care provider approves. Ask your health care provider if you may take showers. You may only be allowed to take sponge baths. If you were given a sedative during the procedure, it can affect you for several hours. Do not drive or operate machinery until your health care provider says that it is safe. Wear a medical alert bracelet in case of an emergency. This will tell any health care providers that you have a port. Keep all follow-up visits. This is important. Contact a health care provider if: You cannot flush your port with saline as directed, or you cannot draw blood from the port. You have a fever or chills. You have redness, swelling, or pain around your port insertion site. You have fluid or blood coming from your port insertion site. Your port insertion site feels warm to the touch. You have pus or a bad smell coming from the port insertion site. Get help right away if: You have chest pain or shortness of  breath. You have bleeding from your port that you cannot control. These symptoms may be an emergency. Get help right away. Call 911. Do not wait to see if the symptoms will go away. Do not drive yourself to the hospital. Summary Take care of the port as told by your health care provider. Keep the manufacturer's information card with you at all times. Change your dressing as told by your health care provider. Contact a health care provider if you have a fever or  chills or if you have redness, swelling, or pain around your port insertion site. Keep all follow-up visits. This information is not intended to replace advice given to you by your health care provider. Make sure you discuss any questions you have with your health care provider. Document Revised: 06/01/2021 Document Reviewed: 06/01/2021 Elsevier Patient Education  [employer].   Moderate Conscious Sedation, Adult, Care After This sheet gives you information about how to care for yourself after your procedure. Your health care provider may also give you more specific instructions. If you have problems or questions, contact your health care provider. What can I expect after the procedure? After the procedure, it is common to have: Sleepiness for several hours. Impaired judgment for several hours. Difficulty with balance. Vomiting if you eat too soon. Follow these instructions at home: For the time period you were told by your health care provider: Rest. Do not participate in activities where you could fall or become injured. Do not drive or use machinery. Do not drink alcohol. Do not take sleeping pills or medicines that cause drowsiness. Do not make important decisions or sign legal documents. Do not take care of children on your own. Eating and drinking  Follow the diet recommended by your health care provider. Drink enough fluid to keep your urine pale yellow. If you vomit: Drink water, juice, or soup when you can drink without vomiting. Make sure you have little or no nausea before eating solid foods. General instructions Take over-the-counter and prescription medicines only as told by your health care provider. Have a responsible adult stay with you for the time you are told. It is important to have someone help care for you until you are awake and alert. Do not smoke. Keep all follow-up visits as told by your health care provider. This is important. Contact a health  care provider if: You are still sleepy or having trouble with balance after 24 hours. You feel light-headed. You keep feeling nauseous or you keep vomiting. You develop a rash. You have a fever. You have redness or swelling around the IV site. Get help right away if: You have trouble breathing. You have new-onset confusion at home. Summary After the procedure, it is common to feel sleepy, have impaired judgment, or feel nauseous if you eat too soon. Rest after you get home. Know the things you should not do after the procedure. Follow the diet recommended by your health care provider and drink enough fluid to keep your urine pale yellow. Get help right away if you have trouble breathing or new-onset confusion at home. This information is not intended to replace advice given to you by your health care provider. Make sure you discuss any questions you have with your health care provider. Document Revised: 03/27/2020 Document Reviewed: 10/24/2019 Elsevier Patient Education  [place].

## 2024-09-27 ENCOUNTER — Encounter (HOSPITAL_COMMUNITY)
Admission: RE | Admit: 2024-09-27 | Discharge: 2024-09-27 | Disposition: A | Discharge status: Home / Self Care | Source: Ambulatory Visit | Attending: Cardiovascular Disease | Admitting: Cardiovascular Disease

## 2024-09-27 DIAGNOSIS — Z951 Presence of aortocoronary bypass graft: Secondary | ICD-10-CM | POA: Diagnosis not present

## 2024-09-27 NOTE — Progress Notes (Signed)
 Daily Session Note  Patient Details  Name: Dave Williams MRN: [id number] Date of Birth: [birth year] Referring Provider:   Flowsheet Row CARDIAC REHAB PHASE II ORIENTATION from 05/20/2024 in [facility] CARDIAC REHABILITATION  Referring Provider Court Carrier MD    Encounter Date: 09/27/2024  Check In:  Session Check In - 09/27/24 0928       Check-In   Supervising physician immediately available to respond to emergencies See telemetry face sheet for immediately available MD    Location [facility]    Staff Present Powell Benders, BS, Exercise Physiologist;Gloria Lambertson Zina, RN    Virtual Visit No    Medication changes reported     Yes    Comments Still on antibiotics for dental surgery and port a cath placement    Fall or balance concerns reported    No    Warm-up and Cool-down Performed on first and last piece of equipment    Resistance Training Performed Yes    VAD Patient? No    PAD/SET Patient? No      Pain Assessment   Currently in Pain? Yes   pt. does note tenderness on right chest, port a cath on right recently placed.   Pain Score 1     Pain Location Chest    Pain Orientation Right;Upper    Pain Descriptors / Indicators Tender    Pain Type Surgical pain    Pain Onset Today    Pain Frequency Constant    Multiple Pain Sites No          Capillary Blood Glucose: No results found for this or any previous visit (from the past 24 hours).    Social History   Tobacco Use  Smoking Status Former   Current packs/day: 0.50   Types: Cigarettes  Smokeless Tobacco Never    Goals Met:  Independence with exercise equipment Exercise tolerated well No report of concerns or symptoms today Strength training completed today  Goals Unmet:  Not Applicable  Comments: Pt able to follow exercise prescription today without complaint.  Will continue to monitor for progression.

## 2024-09-29 ENCOUNTER — Other Ambulatory Visit: Payer: Self-pay

## 2024-09-30 ENCOUNTER — Encounter (HOSPITAL_COMMUNITY)
Admission: RE | Admit: 2024-09-30 | Discharge: 2024-09-30 | Disposition: A | Discharge status: Home / Self Care | Source: Ambulatory Visit | Attending: Cardiovascular Disease | Admitting: Cardiovascular Disease

## 2024-09-30 ENCOUNTER — Other Ambulatory Visit: Payer: Self-pay

## 2024-09-30 VITALS — Ht 73.0 in | Wt 239.3 lb

## 2024-09-30 DIAGNOSIS — C139 Malignant neoplasm of hypopharynx, unspecified: Secondary | ICD-10-CM

## 2024-09-30 DIAGNOSIS — Z951 Presence of aortocoronary bypass graft: Secondary | ICD-10-CM | POA: Diagnosis not present

## 2024-09-30 NOTE — Progress Notes (Signed)
 Daily Session Note  Patient Details  Name: Charistopher Rumble MRN: [id number] Date of Birth: 1956-03-24 Referring Provider:   Flowsheet Row CARDIAC REHAB PHASE II ORIENTATION from 05/20/2024 in [facility] CARDIAC REHABILITATION  Referring Provider Court Carrier MD    Encounter Date: 09/30/2024  Check In:   Capillary Blood Glucose: No results found for this or any previous visit (from the past 24 hours).    Social History   Tobacco Use  Smoking Status Former   Current packs/day: 0.50   Types: Cigarettes  Smokeless Tobacco Never    Goals Met:  Independence with exercise equipment Exercise tolerated well No report of concerns or symptoms today Strength training completed today  Goals Unmet:  Not Applicable  Comments: Pt able to follow exercise prescription today without complaint.  Will continue to monitor for progression.

## 2024-09-30 NOTE — Patient Instructions (Signed)
 Discharge Patient Instructions  Patient Details  Name: Dave Williams MRN: [id number] Date of Birth: December 24, 1955 Referring Provider:  Myra Geni ORN, FNP   Number of Visits: 36  Reason for Discharge:  Patient reached a stable level of exercise. Patient independent in their exercise. Patient has met program and personal goals.  Smoking History:  Social History   Tobacco Use  Smoking Status Former   Current packs/day: 0.50   Types: Cigarettes  Smokeless Tobacco Never    Diagnosis:  S/P CABG x 3  Initial Exercise Prescription:  Initial Exercise Prescription - 05/20/24 1100       Date of Initial Exercise RX and Referring Provider   Date 05/20/24    Referring Provider Court Carrier MD      Oxygen   Maintain Oxygen Saturation 88% or higher      Treadmill   MPH 2.7    Grade 1    Minutes 15    METs 3.44      REL-XR   Level 13    Speed 50    Minutes 15    METs 3.4      Prescription Details   Frequency (times per week) 1   starting with one to meet OOP   Duration Progress to 30 minutes of continuous aerobic without signs/symptoms of physical distress      Intensity   THRR 40-80% of Max Heartrate 102-137    Ratings of Perceived Exertion 11-13    Perceived Dyspnea 0-4      Progression   Progression Continue to progress workloads to maintain intensity without signs/symptoms of physical distress.      Resistance Training   Training Prescription Yes    Weight 5 lb    Reps 10-15          Discharge Exercise Prescription (Final Exercise Prescription Changes):  Exercise Prescription Changes - 09/09/24 1500       Response to Exercise   Blood Pressure (Admit) 142/90    Blood Pressure (Exit) 142/80    Heart Rate (Admit) 82 bpm    Heart Rate (Exercise) 103 bpm    Heart Rate (Exit) 89 bpm    Rating of Perceived Exertion (Exercise) 15    Duration Continue with 30 min of aerobic exercise without signs/symptoms of physical distress.    Intensity THRR  unchanged      Progression   Progression Continue to progress workloads to maintain intensity without signs/symptoms of physical distress.      Resistance Training   Training Prescription Yes    Weight 7    Reps 10-15      Treadmill   MPH 3    Grade 3    Minutes 15    METs 4.54      REL-XR   Level 5    Speed 49    Minutes 15    METs 3.7      Home Exercise Plan   Plans to continue exercise at Home (comment)    Frequency Add 2 additional days to program exercise sessions.          Functional Capacity:  6 Minute Walk     Row Name 05/20/24 1119 09/30/24 0941       6 Minute Walk   Phase Initial Discharge    Distance 1432 feet 1400 feet    Distance % Change -- -0.23 %    Distance Feet Change -- -32 ft    Walk Time 6 minutes 6 minutes    #  of Rest Breaks 0 0    MPH 2.71 2.65    METS 3.31 3.23    RPE 8 13    VO2 Peak 11.57 11.3    Symptoms No Yes (comment)    Comments -- some pain from portacath procedure last week 3/10    Resting HR 69 bpm 93 bpm    Resting BP 110/70 124/64    Resting Oxygen Saturation  95 % --    Exercise Oxygen Saturation  during 6 min walk 97 % --    Max Ex. HR 99 bpm 95 bpm    Max Ex. BP 136/62 156/68    2 Minute Post BP 126/62 --       Quality of Life:  Quality of Life - 09/23/24 1144       Quality of Life   Select Quality of Life      Quality of Life Scores   Health/Function Pre 19.2 %    Health/Function Post 23.3 %    Health/Function % Change 21.35 %    Socioeconomic Pre 26.57 %    Socioeconomic Post 26.75 %    Socioeconomic % Change  0.68 %    Psych/Spiritual Pre 12.93 %    Psych/Spiritual Post 19.57 %    Psych/Spiritual % Change 51.35 %    Family Pre 27.5 %    Family Post 22.5 %    Family % Change -18.18 %    GLOBAL Pre 20.03 %    GLOBAL Post 23.05 %    GLOBAL % Change 15.08 %           Nutrition & Weight - Outcomes:  Pre Biometrics - 05/20/24 1122       Pre Biometrics   Height 6' 1 (1.854 m)    Weight  102.7 kg    Waist Circumference 31 inches    Hip Circumference 44 inches    Waist to Hip Ratio 0.7 %    BMI (Calculated) 29.89    Grip Strength 32.7 kg    Single Leg Stand 28.2 seconds          Post Biometrics - 09/30/24 0943        Post  Biometrics   Height 6' 1 (1.854 m)    Weight 108.5 kg    Waist Circumference 41 inches    Hip Circumference 44 inches    Waist to Hip Ratio 0.93 %    BMI (Calculated) 31.58    Grip Strength 30.2 kg    Single Leg Stand 30 seconds

## 2024-10-01 DIAGNOSIS — Z510 Encounter for antineoplastic radiation therapy: Secondary | ICD-10-CM | POA: Diagnosis not present

## 2024-10-02 ENCOUNTER — Other Ambulatory Visit: Payer: Self-pay

## 2024-10-02 ENCOUNTER — Encounter (HOSPITAL_COMMUNITY)

## 2024-10-02 ENCOUNTER — Inpatient Hospital Stay

## 2024-10-02 ENCOUNTER — Inpatient Hospital Stay (HOSPITAL_BASED_OUTPATIENT_CLINIC_OR_DEPARTMENT_OTHER): Admitting: Oncology

## 2024-10-02 ENCOUNTER — Encounter: Payer: Self-pay | Admitting: Oncology

## 2024-10-02 VITALS — BP 125/74 | HR 80 | Temp 97.6°F | Resp 17 | Ht 73.0 in | Wt 243.0 lb

## 2024-10-02 DIAGNOSIS — Z5111 Encounter for antineoplastic chemotherapy: Secondary | ICD-10-CM | POA: Insufficient documentation

## 2024-10-02 DIAGNOSIS — Z79891 Long term (current) use of opiate analgesic: Secondary | ICD-10-CM | POA: Insufficient documentation

## 2024-10-02 DIAGNOSIS — Z510 Encounter for antineoplastic radiation therapy: Secondary | ICD-10-CM | POA: Diagnosis not present

## 2024-10-02 DIAGNOSIS — Z808 Family history of malignant neoplasm of other organs or systems: Secondary | ICD-10-CM | POA: Insufficient documentation

## 2024-10-02 DIAGNOSIS — C12 Malignant neoplasm of pyriform sinus: Secondary | ICD-10-CM | POA: Diagnosis not present

## 2024-10-02 DIAGNOSIS — R519 Headache, unspecified: Secondary | ICD-10-CM | POA: Insufficient documentation

## 2024-10-02 DIAGNOSIS — M542 Cervicalgia: Secondary | ICD-10-CM | POA: Insufficient documentation

## 2024-10-02 DIAGNOSIS — G893 Neoplasm related pain (acute) (chronic): Secondary | ICD-10-CM | POA: Insufficient documentation

## 2024-10-02 LAB — BASIC METABOLIC PANEL - CANCER CENTER ONLY
Anion gap: 7 (ref 5–15)
BUN: 15 mg/dL (ref 8–23)
CO2: 26 mmol/L (ref 22–32)
Calcium: 9.7 mg/dL (ref 8.9–10.3)
Chloride: 104 mmol/L (ref 98–111)
Creatinine: 1.1 mg/dL (ref 0.61–1.24)
GFR, Estimated: >60 mL/min (ref >60)
Glucose, Bld: 160 mg/dL — ABNORMAL HIGH (ref 70–99)
Potassium: 3.7 mmol/L (ref 3.5–5.1)
Sodium: 137 mmol/L (ref 135–145)

## 2024-10-02 LAB — CBC WITH DIFFERENTIAL (CANCER CENTER ONLY)
Abs Immature Granulocytes: 0.04 K/uL (ref 0.00–0.07)
Basophils Absolute: 0.1 K/uL (ref 0.0–0.1)
Basophils Relative: 1 %
Eosinophils Absolute: 0.2 K/uL (ref 0.0–0.5)
Eosinophils Relative: 2 %
HCT: 37.3 % — ABNORMAL LOW (ref 39.0–52.0)
Hemoglobin: 12.3 g/dL — ABNORMAL LOW (ref 13.0–17.0)
Immature Granulocytes: 0 %
Lymphocytes Relative: 33 %
Lymphs Abs: 3 K/uL (ref 0.7–4.0)
MCH: 29.8 pg (ref 26.0–34.0)
MCHC: 33 g/dL (ref 30.0–36.0)
MCV: 90.3 fL (ref 80.0–100.0)
Monocytes Absolute: 0.3 K/uL (ref 0.1–1.0)
Monocytes Relative: 4 %
Neutro Abs: 5.5 K/uL (ref 1.7–7.7)
Neutrophils Relative %: 60 %
Platelet Count: 277 K/uL (ref 150–400)
RBC: 4.13 MIL/uL — ABNORMAL LOW (ref 4.22–5.81)
RDW: 13.3 % (ref 11.5–15.5)
WBC Count: 9.1 K/uL (ref 4.0–10.5)
nRBC: 0 % (ref 0.0–0.2)

## 2024-10-02 LAB — MAGNESIUM: Magnesium: 2.1 mg/dL (ref 1.7–2.4)

## 2024-10-02 MED FILL — Fosaprepitant Dimeglumine For IV Infusion 150 MG (Base Eq): INTRAVENOUS | Qty: 5 | Status: AC

## 2024-10-02 NOTE — Patient Instructions (Signed)

## 2024-10-02 NOTE — Progress Notes (Signed)
 [facility]  ONCOLOGY CLINIC PROGRESS NOTE   Patient Care Team: Myra Geni ORN, FNP as PCP - General (Family Medicine) Court Dorn PARAS, MD as PCP - Cardiology (Cardiology) Malmfelt, Delon CROME, RN as Oncology Nurse Navigator Izell Domino, MD as Consulting Physician (Radiation Oncology) Autumn Millman, MD as Consulting Physician (Oncology) Tobie Eldora NOVAK, MD as Consulting Physician (Otolaryngology)  PATIENT NAME: Dave Williams   MR#: [id number] DOB: [birth year]  Date of visit: 10/02/2024   ASSESSMENT & PLAN:   Dave Williams is a 68 y.o. 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   Cancer of pyriform sinus (HCC) Please review oncology history for additional details and timeline of events.  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  Patient declined surgical option.  Hence plan made to proceed with concurrent chemoradiation.  Renal function is fluctuating and this needs to be monitored closely.  The cancer has a high risk of recurrence, necessitating aggressive treatment.  We have discussed about role of cisplatin being a radiosensitizer in the treatment of head and neck cancer.  We have discussed about the curative intent of chemoradiation for this patient.     We have discussed about mechanism of action of cisplatin, adverse effects of cisplatin including but not limited to fatigue, nausea, vomiting, increased risk of infections, mucositis, ototoxicity, nephrotoxicity, peripheral neuropathy.  Patient understands that some of the side effects can be permanent and even potentially fatal.    At this time we do not know if weekly cisplatin  is inferior to every 21 days cisplatin since there is no head-to-head comparison trial. I did mention to the patient however weekly cisplatin is well-tolerated with less adverse effects and most of the patients tend to complete treatment as planned.  Patient is willing to proceed with weekly cisplatin.  He has had Port-A-Cath placement and chemo education.  - Plan is to use cisplatin as the preferred chemotherapy agent, contingent on adequate kidney function due to nephrotoxicity.  Other options include weekly docetaxel or weekly carboplatin and paclitaxel, if kidney function does not permit cisplatin use.  - Patient declined feeding tube placement at this time.  - Encourage hydration to maintain kidney function, especially if not using a feeding tube.  Appropriate referrals were previously made for supportive care including physical therapy, speech pathology, nutrition.  Labs today reveal no dose-limiting toxicities.  Creatinine is better at 1.1.  We will proceed with cycle 1 of cisplatin at 40 mg/m dose tomorrow, 10/03/2024.  He is scheduled to begin radiation treatments from tomorrow as well.  RTC in 1 week for labs, follow-up and continuation of chemotherapy.    I reviewed lab results and outside records for this visit and discussed relevant results with the patient. Diagnosis, plan of care and treatment options were also discussed in detail with the patient. Opportunity provided to ask questions and answers provided to his apparent satisfaction. Provided instructions to call our clinic with any problems, questions or concerns prior to return visit. I recommended to continue follow-up with PCP and sub-specialists. He verbalized understanding and agreed with the plan.   NCCN guidelines have been consulted in the planning of this patient's care.  I spent a total of 30 minutes  during this encounter with the patient including review of chart and various tests results, discussions about plan  of care and coordination of care plan.   Chinita Patten, MD  10/02/2024 4:37 PM  [facility] - A DEPT OF [facility] [address] [place] [address] Dept: [phone] Dept Fax: [phone]    CHIEF COMPLAINT/ REASON FOR VISIT:   Squamous cell carcinoma of the pyriform sinus, cT2, cN3, cM0, stage IV B.   Current Treatment:  Concurrent chemoradiation with weekly cisplatin started from 10/03/2024  INTERVAL HISTORY:    Discussed the use of AI scribe software for clinical note transcription with the patient, who gave verbal consent to proceed.  History of Present Illness Dave Williams is a 68 year old male who presents for initiation of chemotherapy and radiation therapy for cancer.  He is scheduled to begin chemotherapy and radiation therapy tomorrow, with weekly chemotherapy sessions concurrent with radiation therapy for seven weeks.  He has experienced a recent drop in hemoglobin to 12.3, which he attributes to blood loss from a dental procedure ten days ago where he had teeth removed. He has been trying to stay hydrated, which has helped improve his kidney function, with creatinine levels decreasing from 1.3 to 1.1.  He underwent open heart surgery in March. He mentions difficulty eating due to the recent dental procedure, as he is currently without most of his teeth, but he manages with softer foods like stuffed peppers, cabbage, and soups. He anticipates getting dentures in January or February.  He maintains a positive outlook despite the challenges, noting that his family is coming to visit him for the first time in eight years.   I have reviewed the past medical history, past surgical history, social history and family history with the patient and they are unchanged from previous note.  HISTORY OF PRESENT ILLNESS:   ONCOLOGY HISTORY:   He presented to his PCP with complains of a recurrent right neck  mass.   Patient had US  neck on 06/05/24 showing enlarged lymph nodes in right superior cervical chain measuring 5.1 x 2.1 x 3.0 cm and 2.5 x 1.4 x 1.6 cm. CT soft tissue neck was performed on 06/16/24 showed a 2 cm supraglottic mass to the right of midline consistent with a malignancy and bulky malignant lymphadenopathy on the right at level 2 and level 3, with indistinct margins consistent with extra capsular spread. The upper level 2 node shows a diameter of 2.2 x 2.8 cm. The lower level 2 node measures approximately 28 x 17 mm. Level 3 lymph node similarly with indistinct margins shows diameter of 21 x 19 mm.    His PCP referred him to our clinic for further evaluation of neck lymphadenopathy.  He was seen in our rapid diagnostic clinic on 06/19/2024.  CBCD was unremarkable.  Flow cytometry of peripheral blood was also unremarkable.  ESR, CRP, LDH were within normal limits.  HIV, hepatitis panel testing was negative.   To further investigate his symptoms, he was referred to Dr. Tobie on 06/26/24. At that time, patient recalled noticing a right neck mass last year that eventually resolved on its own. However, in January 2025, mass had reoccurred and had increased in size, causing  discomfort in the area.    He then underwent a Transnasal Fiberoptic Laryngoscopy indicating a hypopharynx neck mass involving the pyriform and likely arytenoid submucosally concerning for malignancy. Vocal folds were mobile bilaterally. No  lesions on the free edge of the vocal folds. No airway compromise concern   Subsequently, he underwent a right neck mass biopsy on 07/24/24 which revealed invasive squamous cell carcinoma.         He presented for a follow up with Dr. Tobie on 07/31/24 to discuss further treatment options. Patient expressed no interest in surgical interventions. He would like to consider chemoradiation.    PET scan performed on 08/01/24 showed the right eccentric supraglottic mass has a maximum SUV of 6.9  along with hypermetabolic activity in all identified lymph nodes. Scan also noted several scattered pulmonary nodules with the largest measuring 0.4 x 0.5 cm with surveillance recommended as nodules are small in size to detect hypermetabolic activity.     Family history includes his father having esophageal cancer that metastasized to the spine, his mother having skin and anal cancer, and his sister having thyroid  cancer.    He had consultation with Dr. Izell on 08/06/2024.  Plan made for 7 weeks of radiation therapy and he was referred to us  for concurrent chemotherapy.   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  Patient declined surgical option.  Hence plan made to proceed with concurrent chemoradiation using weekly cisplatin.  Renal function is fluctuating and this needs to be monitored closely.   Further chart review shows patient established care with GI on 06/10/24 after having a positive cologuard test. It was recommended patient have colonoscopy however he prefers to hold off while undergoing oncology work up. GI will reach out to patient in 2 months to schedule.   Started concurrent chemoradiation with weekly cisplatin from 10/03/2024.  Oncology History  Cancer of pyriform sinus (HCC)  08/07/2024 Initial Diagnosis   Cancer of pyriform sinus (HCC)   08/07/2024 Cancer Staging   Staging form: Pharynx - Hypopharynx, AJCC 8th Edition - Clinical stage from 08/07/2024: Stage IVB (cT2, cN3, cM0) - Signed by Izell Domino, MD on 08/07/2024 Stage prefix: Initial diagnosis   10/03/2024 -  Chemotherapy   Patient is on Treatment Plan : HEAD/NECK Cisplatin (40) q7d         REVIEW OF SYSTEMS:   Review of Systems - Oncology  All other pertinent systems were reviewed with the patient and are negative.  ALLERGIES: He has no known allergies.  MEDICATIONS:  Current Outpatient Medications  Medication Sig Dispense Refill   acetaminophen  (TYLENOL ) 500  MG tablet Take 1,000 mg by mouth every 6 (six) hours as needed (pain.).     amLODipine  (NORVASC ) 10 MG tablet Take 1 tablet (10 mg total) by mouth daily. 90 tablet 2   aspirin  EC 81 MG tablet Take 1 tablet (81 mg total) by mouth daily. Swallow whole.     atorvastatin  (LIPITOR ) 80 MG tablet Take 1 tablet (80 mg total) by mouth daily with supper. 90 tablet 3   HYDROcodone -acetaminophen  (NORCO) 7.5-325 MG tablet Take 1 tablet by mouth every 6 (six) hours as needed for moderate pain (pain score 4-6). 60 tablet 0   isosorbide  mononitrate (IMDUR ) 30 MG 24 hr tablet Take 1 tablet (30 mg total) by mouth daily. 90 tablet 1   lidocaine -prilocaine  (EMLA ) cream Apply to affected area once 30 g 3   nitroGLYCERIN  (NITROSTAT ) 0.4 MG SL tablet Place 1 tablet (0.4 mg total) under the tongue every 5 (five) minutes as needed for chest pain. DISSOLVE 1 TABLET UNDER THE TONGUE EVERY 5 MINUTES AS NEEDED FOR CHEST PAIN. DO NOT EXCEED A TOTAL  OF 3 DOSES IN 15 MINUTES. 25 tablet 6   ondansetron  (ZOFRAN ) 8 MG tablet Take 1 tablet (8 mg total) by mouth every 8 (eight) hours as needed for nausea or vomiting. Start on the third day after cisplatin. 30 tablet 1   pantoprazole  (PROTONIX ) 40 MG tablet Take 1 tablet (40 mg total) by mouth daily. 30 tablet 0   prochlorperazine  (COMPAZINE ) 10 MG tablet Take 1 tablet (10 mg total) by mouth every 6 (six) hours as needed (Nausea or vomiting). 30 tablet 1   vitamin D3 (CHOLECALCIFEROL ) 25 MCG tablet Take 1 tablet (1,000 Units total) by mouth daily. 30 tablet 0   dexamethasone  (DECADRON ) 4 MG tablet Take 2 tablets (8 mg) by mouth daily x 3 days starting the day after cisplatin chemotherapy. Take with food. (Patient not taking: Reported on 10/02/2024) 30 tablet 1   No current facility-administered medications for this visit.     VITALS:   Blood pressure 125/74, pulse 80, temperature 97.6 F (36.4 C), temperature source Temporal, resp. rate 17, height 6' 1 (1.854 m), weight 243 lb  (110.2 kg), SpO2 97%.  Wt Readings from Last 3 Encounters:  10/02/24 243 lb (110.2 kg)  09/30/24 239 lb 4.8 oz (108.5 kg)  09/25/24 240 lb (108.9 kg)    Body mass index is 32.06 kg/m.    Onc Performance Status - 10/02/24 1513       ECOG Perf Status   ECOG Perf Status Fully active, able to carry on all pre-disease performance without restriction      KPS SCALE   KPS % SCORE Normal, no compliants, no evidence of disease          PHYSICAL EXAM:   Physical Exam Constitutional:      General: He is not in acute distress.    Appearance: Normal appearance.  HENT:     Head: Normocephalic and atraumatic.  Eyes:     Conjunctiva/sclera: Conjunctivae normal.  Cardiovascular:     Rate and Rhythm: Normal rate and regular rhythm.  Pulmonary:     Effort: Pulmonary effort is normal. No respiratory distress.  Chest:     Comments: Port-A-Cath in place without signs of infection. Abdominal:     General: There is no distension.  Lymphadenopathy:     Cervical: Cervical adenopathy (Right Level 2/3 mass, fixed, >5cm) present.  Neurological:     General: No focal deficit present.     Mental Status: He is alert and oriented to person, place, and time.  Psychiatric:        Mood and Affect: Mood normal.        Behavior: Behavior normal.      LABORATORY DATA:   I have reviewed the data as listed.  Results for orders placed or performed in visit on 10/02/24  Magnesium   Result Value Ref Range   Magnesium  2.1 1.7 - 2.4 mg/dL  Basic Metabolic Panel - Cancer Center Only  Result Value Ref Range   Sodium 137 135 - 145 mmol/L   Potassium 3.7 3.5 - 5.1 mmol/L   Chloride 104 98 - 111 mmol/L   CO2 26 22 - 32 mmol/L   Glucose, Bld 160 (H) 70 - 99 mg/dL   BUN 15 8 - 23 mg/dL   Creatinine 8.89 9.38 - 1.24 mg/dL   Calcium  9.7 8.9 - 10.3 mg/dL   GFR, Estimated >39 >39 mL/min   Anion gap 7 5 - 15  CBC with Differential (Cancer Center Only)  Result Value Ref Range  WBC Count 9.1 4.0 - 10.5  K/uL   RBC 4.13 (L) 4.22 - 5.81 MIL/uL   Hemoglobin 12.3 (L) 13.0 - 17.0 g/dL   HCT 62.6 (L) 60.9 - 47.9 %   MCV 90.3 80.0 - 100.0 fL   MCH 29.8 26.0 - 34.0 pg   MCHC 33.0 30.0 - 36.0 g/dL   RDW 86.6 88.4 - 84.4 %   Platelet Count 277 150 - 400 K/uL   nRBC 0.0 0.0 - 0.2 %   Neutrophils Relative % 60 %   Neutro Abs 5.5 1.7 - 7.7 K/uL   Lymphocytes Relative 33 %   Lymphs Abs 3.0 0.7 - 4.0 K/uL   Monocytes Relative 4 %   Monocytes Absolute 0.3 0.1 - 1.0 K/uL   Eosinophils Relative 2 %   Eosinophils Absolute 0.2 0.0 - 0.5 K/uL   Basophils Relative 1 %   Basophils Absolute 0.1 0.0 - 0.1 K/uL   Immature Granulocytes 0 %   Abs Immature Granulocytes 0.04 0.00 - 0.07 K/uL      RADIOGRAPHIC STUDIES:  I have personally reviewed the radiological images as listed and agree with the findings in the report.  IR IMAGING GUIDED PORT INSERTION Result Date: 09/25/2024 CLINICAL DATA:  Squamous cell cancer of the hypopharynx. Chest port placement for therapy. EXAM: Chest port catheter placement TECHNIQUE: Procedure performed using fluoroscopy and ultrasound CONTRAST:  None RADIOPHARMACEUTICALS:  None FLUOROSCOPY: 1 mGy COMPARISON:  None FINDINGS: The patient was placed in supine position on the IR gantry and the right upper chest and neck were prepped and draped in the usual sterile fashion. The nurse administered intravenous fentanyl  and Versed  under my supervision and the nurse had no other injuries other than monitoring the patient and administering medications. I was present for the entire duration of procedure. 175 mcg intravenous fentanyl  and 3.5 mg intravenous Versed  were administered for a total sedation time of 40 minutes. Ultrasound guidance was used to investigate the right internal jugular vein which was anechoic and compressible indicating patency. The needle was then advanced from a scan negative through the soft tissue into the right internal jugular vein under ultrasound guidance. A final  image was obtained and stored in the patient's permanent medical record. Access was then exchanged over a guidewire which was advanced under fluoroscopic guidance. The needle was removed and replaced with a micropuncture sheath. Approximately 2 inches below the clavicle the port pocket was created with a subsequent incision. The catheter was then tunneled from the port pocket to the venotomy site overlying the right internal jugular vein. Access was then exchanged over an 035 guidewire for peel-away sheath which was advanced over the guidewire under fluoroscopic guidance. The catheter was then advanced through the peel-away sheath to the sinoatrial junction. Sheath was removed. The catheter was then cut at the port pocket and connected to chest port. The chest port was tested for function and finally function well. The chest port was then flushed with heparin  and a port pocket was closed with 4-0 suture. Final image was obtained demonstrating satisfactory position of chest port. The final count of all materials was satisfactory. IMPRESSION: 1. Satisfactory placement of right internal jugular vein single-lumen chest port. The catheter tip is at the cavoatrial junction. 2.  Okay to use and power inject chest port. Electronically Signed   By: Cordella Banner   On: 09/25/2024 15:39     CODE STATUS:  Code Status History     Date Active Date Inactive Code Status Order  ID Comments User Context   09/25/2024 1523 09/26/2024 0524 Full Code [id number]  Jenna Cordella LABOR, MD HOV   03/13/2024 1550 03/21/2024 1532 Full Code [id number]  Maurice Sharlet GORMAN DEVONNA Inpatient   03/04/2024 1247 03/13/2024 1548 Full Code [id number]  Viviane Lemond FORBES DEVONNA Inpatient   11/28/2023 1625 11/28/2023 2356 Full Code [id number]  [place], Peter M, MD Inpatient    Questions for Most Recent Historical Code Status (Order [id number])     Question Answer   By: Consent: discussion documented in EHR            Orders Placed This Encounter   Procedures   CBC with Differential (Cancer Center Only)    Standing Status:   Future    Expected Date:   10/24/2024    Expiration Date:   10/24/2025   Basic Metabolic Panel - Cancer Center Only    Standing Status:   Future    Expected Date:   10/24/2024    Expiration Date:   10/24/2025   Magnesium     Standing Status:   Future    Expected Date:   10/24/2024    Expiration Date:   10/24/2025   CBC with Differential (Cancer Center Only)    Standing Status:   Future    Expected Date:   10/31/2024    Expiration Date:   10/31/2025   Basic Metabolic Panel - Cancer Center Only    Standing Status:   Future    Expected Date:   10/31/2024    Expiration Date:   10/31/2025   Magnesium     Standing Status:   Future    Expected Date:   10/31/2024    Expiration Date:   10/31/2025   CBC with Differential (Cancer Center Only)    Standing Status:   Future    Expected Date:   11/07/2024    Expiration Date:   11/07/2025   Basic Metabolic Panel - Cancer Center Only    Standing Status:   Future    Expected Date:   11/07/2024    Expiration Date:   11/07/2025   Magnesium     Standing Status:   Future    Expected Date:   11/07/2024    Expiration Date:   11/07/2025   CBC with Differential (Cancer Center Only)    Standing Status:   Future    Expected Date:   11/14/2024    Expiration Date:   11/14/2025   Basic Metabolic Panel - Cancer Center Only    Standing Status:   Future    Expected Date:   11/14/2024    Expiration Date:   11/14/2025   Magnesium     Standing Status:   Future    Expected Date:   11/14/2024    Expiration Date:   11/14/2025     Future Appointments  Date Time Provider Department Center  10/03/2024  8:00 AM [facility] INFUSION [facility] None  10/03/2024 11:15 AM Ivonne Harlene GORMAN, RD [facility] None  10/03/2024  2:45 PM Izell Domino, MD [facility] None  10/04/2024  9:15 AM [facility] REHAB PROGRAM SESSION [facility]  10/04/2024 11:45 AM [facility] [id number]  [facility] None  10/07/2024  9:15 AM AP-CARDPULM REHAB PROGRAM SESSION [facility]  10/07/2024  3:00 PM [facility] [id number] [facility] None  10/07/2024  3:15 PM LINAC-SQUIRE [facility] None  10/08/2024 12:00 PM [facility] LINAC 4 [facility] None  10/09/2024  9:15 AM AP-CARDPULM REHAB PROGRAM SESSION [facility]  10/09/2024 11:00 AM [facility] FLUSH [facility] None  10/09/2024 11:45 AM Errick Salts,  MD [facility] None  10/09/2024  2:00 PM [facility] LINAC 4 [facility] None  10/10/2024  8:00 AM [facility] INFUSION [facility] None  10/10/2024  2:30 PM Ivonne Raisin S, RD [facility] None  10/10/2024  3:00 PM [facility] LINAC 4 [facility] None  10/11/2024  9:15 AM AP-CARDPULM REHAB PROGRAM SESSION [facility] AP[facility]  10/11/2024 11:15 AM [facility] LINAC 4 [facility] None  10/14/2024 11:15 AM [facility] LINAC 4 [facility] None  10/15/2024 10:15 AM [facility] LINAC 4 [facility] None  10/16/2024  9:00 AM [facility] FLUSH [facility] None  10/16/2024  9:30 AM Toby Breithaupt, MD [facility] None  10/16/2024 10:15 AM [facility] LINAC 4 [facility] None  10/17/2024  7:30 AM [facility] INFUSION [facility] None  10/17/2024  9:00 AM Ivonne Raisin RAMAN, RD [facility] None  10/17/2024  2:45 PM [facility] LINAC 4 [facility] None  10/18/2024 10:15 AM [facility] LINAC 4 [facility] None  10/21/2024 10:15 AM [facility] LINAC 4 [facility] None  10/22/2024 10:15 AM [facility] LINAC 4 [facility] None  10/23/2024 10:15 AM [facility] LINAC 4 [facility] None  10/24/2024 10:15 AM [facility] LINAC 4 [facility] None  10/25/2024 10:15 AM [facility] LINAC 4 [facility] None  10/28/2024 10:15 AM [facility] LINAC 4 [facility] None  10/29/2024 10:15 AM [facility] LINAC 4 [facility] None  10/30/2024 10:15 AM [facility] LINAC 4 [facility] None  10/31/2024 10:15 AM [facility] LINAC 4 [facility] None  11/01/2024 10:15 AM [facility] LINAC 4 [facility] None  11/04/2024  10:15 AM [facility] LINAC 4 [facility] None  11/05/2024 10:15 AM [facility] LINAC 4 [facility] None  11/06/2024 10:15 AM [facility] LINAC 4 [facility] None  11/11/2024 10:15 AM [facility] LINAC 4 [facility] None  11/12/2024 10:15 AM [facility] LINAC 4 [facility] None  11/13/2024 10:15 AM [facility] LINAC 4 [facility] None  11/14/2024 10:15 AM [facility] LINAC 4 [facility] None  11/15/2024 10:15 AM [facility] LINAC 4 [facility] None  11/18/2024 10:15 AM [facility] LINAC 4 [facility] None  11/19/2024 10:15 AM [facility] LINAC 4 [facility] None  11/20/2024 10:15 AM [facility] LINAC 4 [facility] None  11/21/2024 10:15 AM [facility] LINAC 4 [facility] None  11/22/2024 10:15 AM [facility] LINAC 4 [facility] None  01/22/2025 10:30 AM Fountain, Madison L, NP [facility]     This document was completed utilizing speech recognition software. Grammatical errors, random word insertions, pronoun errors, and incomplete sentences are an occasional consequence of this system due to software limitations, ambient noise, and hardware issues. Any formal questions or concerns about the content, text or information contained within the body of this dictation should be directly addressed to the provider for clarification.

## 2024-10-02 NOTE — Assessment & Plan Note (Signed)
 Please review oncology history for additional details and timeline of events.  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  Patient declined surgical option.  Hence plan made to proceed with concurrent chemoradiation.  Renal function is fluctuating and this needs to be monitored closely.  The cancer has a high risk of recurrence, necessitating aggressive treatment.  We have discussed about role of cisplatin being a radiosensitizer in the treatment of head and neck cancer.  We have discussed about the curative intent of chemoradiation for this patient.     We have discussed about mechanism of action of cisplatin, adverse effects of cisplatin including but not limited to fatigue, nausea, vomiting, increased risk of infections, mucositis, ototoxicity, nephrotoxicity, peripheral neuropathy.  Patient understands that some of the side effects can be permanent and even potentially fatal.    At this time we do not know if weekly cisplatin is inferior to every 21 days cisplatin since there is no head-to-head comparison trial. I did mention to the patient however weekly cisplatin is well-tolerated with less adverse effects and most of the patients tend to complete treatment as planned.  Patient is willing to proceed with weekly cisplatin.  He has had Port-A-Cath placement and chemo education.  - Plan is to use cisplatin as the preferred chemotherapy agent, contingent on adequate kidney function due to nephrotoxicity.  Other options include weekly docetaxel or weekly carboplatin and paclitaxel, if kidney function does not permit cisplatin use.  - Patient declined feeding tube placement at this time.  - Encourage hydration to maintain kidney function, especially if not using a feeding tube.  Appropriate referrals were previously made for supportive care including physical therapy, speech pathology, nutrition.  Labs today reveal no dose-limiting toxicities.   Creatinine is better at 1.1.  We will proceed with cycle 1 of cisplatin at 40 mg/m dose tomorrow, 10/03/2024.  He is scheduled to begin radiation treatments from tomorrow as well.  RTC in 1 week for labs, follow-up and continuation of chemotherapy.

## 2024-10-03 ENCOUNTER — Ambulatory Visit
Admission: RE | Admit: 2024-10-03 | Discharge: 2024-10-03 | Disposition: A | Source: Ambulatory Visit | Attending: Radiation Oncology | Admitting: Radiation Oncology

## 2024-10-03 ENCOUNTER — Other Ambulatory Visit: Payer: Self-pay

## 2024-10-03 ENCOUNTER — Inpatient Hospital Stay

## 2024-10-03 ENCOUNTER — Inpatient Hospital Stay: Admitting: Dietician

## 2024-10-03 VITALS — BP 112/90 | HR 78 | Temp 97.7°F | Resp 18

## 2024-10-03 DIAGNOSIS — C12 Malignant neoplasm of pyriform sinus: Secondary | ICD-10-CM

## 2024-10-03 DIAGNOSIS — Z510 Encounter for antineoplastic radiation therapy: Secondary | ICD-10-CM | POA: Diagnosis not present

## 2024-10-03 LAB — RAD ONC ARIA SESSION SUMMARY
Course Elapsed Days: 0
Plan Fractions Treated to Date: 1
Plan Prescribed Dose Per Fraction: 2 Gy
Plan Total Fractions Prescribed: 35
Plan Total Prescribed Dose: 70 Gy
Reference Point Dosage Given to Date: 2 Gy
Reference Point Session Dosage Given: 2 Gy
Session Number: 1

## 2024-10-03 MED ORDER — SODIUM CHLORIDE 0.9 % IV SOLN
150.0000 mg | Freq: Once | INTRAVENOUS | Status: AC
  Administered 2024-10-03: 150 mg via INTRAVENOUS
  Filled 2024-10-03: qty 150

## 2024-10-03 MED ORDER — POTASSIUM CHLORIDE IN NACL 20-0.9 MEQ/L-% IV SOLN
Freq: Once | INTRAVENOUS | Status: AC
  Filled 2024-10-03: qty 1000

## 2024-10-03 MED ORDER — PALONOSETRON HCL INJECTION 0.25 MG/5ML
0.2500 mg | Freq: Once | INTRAVENOUS | Status: AC
  Administered 2024-10-03: 0.25 mg via INTRAVENOUS
  Filled 2024-10-03: qty 5

## 2024-10-03 MED ORDER — MAGNESIUM SULFATE 2 GM/50ML IV SOLN
2.0000 g | Freq: Once | INTRAVENOUS | Status: AC
  Administered 2024-10-03: 2 g via INTRAVENOUS
  Filled 2024-10-03: qty 50

## 2024-10-03 MED ORDER — SODIUM CHLORIDE 0.9 % IV SOLN: INTRAVENOUS | Status: DC

## 2024-10-03 MED ORDER — ACETAMINOPHEN 500 MG PO TABS
1000.0000 mg | ORAL_TABLET | Freq: Once | ORAL | Status: AC
  Administered 2024-10-03: 1000 mg via ORAL
  Filled 2024-10-03: qty 2

## 2024-10-03 MED ORDER — SODIUM CHLORIDE 0.9 % IV SOLN
40.0000 mg/m2 | Freq: Once | INTRAVENOUS | Status: AC
  Administered 2024-10-03: 100 mg via INTRAVENOUS
  Filled 2024-10-03: qty 100

## 2024-10-03 MED ORDER — DEXAMETHASONE SOD PHOSPHATE PF 10 MG/ML IJ SOLN
10.0000 mg | Freq: Once | INTRAMUSCULAR | Status: AC
  Administered 2024-10-03: 10 mg via INTRAVENOUS

## 2024-10-03 NOTE — Progress Notes (Signed)
 Nutrition Assessment   Reason for Assessment: HNC   ASSESSMENT: 68 year old male with stage IV cancer of pyriform sinus. He is receiving concurrent chemoradiation with weekly cisplatin (start 10/23). Patient followed by Dr. Autumn and Dr. Izell.   Past medical history includes CVA, CAD s/p CABG x3, PAF, HTN, urinary retention, hypercholesterolemia, s/p multiple extractions  Met with patient in infusion. He is doing well today. Patient reports good appetite, eating 2 meals daily as usual. Patient eating soft foods as he has no upper teeth. These were extracted prior to starting treatment. He is planning to get dentures once he is done with treatment. Patient ate 4 eggs this morning which is typical. Eating hearty soups, spaghetti, peanut butter/jelly sandwiches, steamed soft vegetables for dinner. Patient is hesitant about having feeding tube placed at this time. He says he has purchased a ninja and juicer. He will drink meats if it comes down to it. Patient has purchased Boost. This is expensive. He drinks milk occasionally with sandwiches.   Nutrition Focused Physical Exam: well nourished, no fat/muscle depletion appreciated    Medications: norvasc , lipitor , decadron , norco, imdur , zofran , compazine , D3   Labs: glucose 160   Anthropometrics:   Height: 6'1 Weight: 243 lb  UBW: 240-245 lb  BMI: 32.06   Estimated Energy Needs  Kcals: 2700-3080 Protein: 132-154 Fluid: 3L    NUTRITION DIAGNOSIS: Food and nutrition related knowledge deficit related to [facility] as evidenced by no prior need for associated nutrition education    INTERVENTION:  Educated on importance of adequate calorie/protein energy intake to preserve LBM/support post treatment healing Discussed likely side effects of therapy including altered taste, dry mouth, sore throat, dysphagia, odynophagia - encouraged pt to consider feeding tube  Educated on soft moist high protein foods - handout provided Encouraged snacks +  Boost Plus/equivalent mid day given he does not eat lunch as well as suggested adding bedtime snack - snack ideas, Ensure Complete, CIB powder samples provided  Contact information provided    MONITORING, EVALUATION, GOAL: Patient will tolerate increased calories and protein during treatment to minimize wt loss/promote healing post treatment    Next Visit: Thursday October 30 during infusion

## 2024-10-03 NOTE — Progress Notes (Signed)
 Oncology Nurse Navigator Documentation   To provide support, encouragement and care continuity, met with Mr. Wight after his initial chemotherapy and RT today.  I reviewed the 2-step treatment process, answered questions.  Mr. Filley completed treatment without difficulty, denied questions/concerns. I reviewed the registration/arrival procedure for subsequent treatments. I encouraged him to call me with questions/concerns as treatments proceed.   [name] RN, BSN, OCN Head & Neck Oncology Nurse Navigator [facility] Phone # [phone]  Fax # [phone]

## 2024-10-03 NOTE — Patient Instructions (Signed)
 [facility] - A DEPT OF [facility]  Discharge Instructions: Thank you for choosing [facility] to provide your oncology and hematology care.   If you have a lab appointment with the Cancer Center, please go directly to the Cancer Center and check in at the registration area.   Wear comfortable clothing and clothing appropriate for easy access to any Portacath or PICC line.   We strive to give you quality time with your provider. You may need to reschedule your appointment if you arrive late (15 or more minutes).  Arriving late affects you and other patients whose appointments are after yours.  Also, if you miss three or more appointments without notifying the office, you may be dismissed from the clinic at the provider's discretion.      For prescription refill requests, have your pharmacy contact our office and allow 72 hours for refills to be completed.    Today you received the following chemotherapy and/or immunotherapy agents cisplatin      To help prevent nausea and vomiting after your treatment, we encourage you to take your nausea medication as directed.  BELOW ARE SYMPTOMS THAT SHOULD BE REPORTED IMMEDIATELY: *FEVER GREATER THAN 100.4 F (38 C) OR HIGHER *CHILLS OR SWEATING *NAUSEA AND VOMITING THAT IS NOT CONTROLLED WITH YOUR NAUSEA MEDICATION *UNUSUAL SHORTNESS OF BREATH *UNUSUAL BRUISING OR BLEEDING *URINARY PROBLEMS (pain or burning when urinating, or frequent urination) *BOWEL PROBLEMS (unusual diarrhea, constipation, pain near the anus) TENDERNESS IN MOUTH AND THROAT WITH OR WITHOUT PRESENCE OF ULCERS (sore throat, sores in mouth, or a toothache) UNUSUAL RASH, SWELLING OR PAIN  UNUSUAL VAGINAL DISCHARGE OR ITCHING   Items with * indicate a potential emergency and should be followed up as soon as possible or go to the Emergency Department if any problems should occur.  Please show the CHEMOTHERAPY ALERT CARD or IMMUNOTHERAPY  ALERT CARD at check-in to the Emergency Department and triage nurse.  Should you have questions after your visit or need to cancel or reschedule your appointment, please contact [facility] - A DEPT OF [facility]  Dept: [phone]  and follow the prompts.  Office hours are 8:00 a.m. to 4:30 p.m. Monday - Friday. Please note that voicemails left after 4:00 p.m. may not be returned until the following business day.  We are closed weekends and major holidays. You have access to a nurse at all times for urgent questions. Please call the main number to the clinic Dept: [phone] and follow the prompts.   For any non-urgent questions, you may also contact your provider using MyChart. We now offer e-Visits for anyone 66 and older to request care online for non-urgent symptoms. For details visit [email or web address].   Also download the MyChart app! Go to the app store, search "MyChart", open the app, select [place], and log in with your MyChart username and password.

## 2024-10-04 ENCOUNTER — Encounter (HOSPITAL_COMMUNITY)

## 2024-10-04 ENCOUNTER — Other Ambulatory Visit: Payer: Self-pay

## 2024-10-04 ENCOUNTER — Telehealth: Payer: Self-pay

## 2024-10-04 ENCOUNTER — Ambulatory Visit
Admission: RE | Admit: 2024-10-04 | Discharge: 2024-10-04 | Disposition: A | Discharge status: Home / Self Care | Source: Ambulatory Visit | Attending: Radiation Oncology | Admitting: Radiation Oncology

## 2024-10-04 DIAGNOSIS — Z510 Encounter for antineoplastic radiation therapy: Secondary | ICD-10-CM | POA: Diagnosis not present

## 2024-10-04 LAB — RAD ONC ARIA SESSION SUMMARY
Course Elapsed Days: 1
Plan Fractions Treated to Date: 2
Plan Prescribed Dose Per Fraction: 2 Gy
Plan Total Fractions Prescribed: 35
Plan Total Prescribed Dose: 70 Gy
Reference Point Dosage Given to Date: 4 Gy
Reference Point Session Dosage Given: 2 Gy
Session Number: 2

## 2024-10-04 NOTE — Telephone Encounter (Signed)
-----   Message from Nurse Damien ORN sent at 10/03/2024  2:39 PM EDT ----- Regarding: Dr Autumn first time cisplatin Pt had first time tx today. Tolerated with no issues. Please f/u

## 2024-10-04 NOTE — Telephone Encounter (Signed)
 [facility] Clinical Social Work  Clinical Social Work was referred by medical provider for assessment of psychosocial needs.  Clinical Social Worker attempted to contact patient by phone to offer support and assess for needs.     Interventions: CSW left VM with purpose of call. CSW informed patient tentative appt set for 10/29 following radiation. If appointment needs to be changed or altered, direct contact given.   Lizbeth Sprague, LCSW  Clinical Social Worker [facility]

## 2024-10-07 ENCOUNTER — Ambulatory Visit
Admission: RE | Admit: 2024-10-07 | Discharge: 2024-10-07 | Disposition: A | Discharge status: Home / Self Care | Source: Ambulatory Visit | Attending: Radiation Oncology | Admitting: Radiation Oncology

## 2024-10-07 ENCOUNTER — Other Ambulatory Visit: Payer: Self-pay | Admitting: Radiation Oncology

## 2024-10-07 ENCOUNTER — Encounter (HOSPITAL_COMMUNITY)

## 2024-10-07 ENCOUNTER — Other Ambulatory Visit: Payer: Self-pay

## 2024-10-07 DIAGNOSIS — C12 Malignant neoplasm of pyriform sinus: Secondary | ICD-10-CM

## 2024-10-07 DIAGNOSIS — Z510 Encounter for antineoplastic radiation therapy: Secondary | ICD-10-CM | POA: Diagnosis not present

## 2024-10-07 LAB — RAD ONC ARIA SESSION SUMMARY
Course Elapsed Days: 4
Plan Fractions Treated to Date: 3
Plan Prescribed Dose Per Fraction: 2 Gy
Plan Total Fractions Prescribed: 35
Plan Total Prescribed Dose: 70 Gy
Reference Point Dosage Given to Date: 6 Gy
Reference Point Session Dosage Given: 2 Gy
Session Number: 3

## 2024-10-07 MED ORDER — LIDOCAINE VISCOUS HCL 2 % MT SOLN: OROMUCOSAL | 3 refills | Status: DC

## 2024-10-08 ENCOUNTER — Other Ambulatory Visit: Payer: Self-pay

## 2024-10-08 ENCOUNTER — Ambulatory Visit
Admission: RE | Admit: 2024-10-08 | Discharge: 2024-10-08 | Disposition: A | Discharge status: Home / Self Care | Source: Ambulatory Visit | Attending: Radiation Oncology | Admitting: Radiation Oncology

## 2024-10-08 DIAGNOSIS — Z510 Encounter for antineoplastic radiation therapy: Secondary | ICD-10-CM | POA: Diagnosis not present

## 2024-10-08 LAB — RAD ONC ARIA SESSION SUMMARY
Course Elapsed Days: 5
Plan Fractions Treated to Date: 4
Plan Prescribed Dose Per Fraction: 2 Gy
Plan Total Fractions Prescribed: 35
Plan Total Prescribed Dose: 70 Gy
Reference Point Dosage Given to Date: 8 Gy
Reference Point Session Dosage Given: 2 Gy
Session Number: 4

## 2024-10-09 ENCOUNTER — Inpatient Hospital Stay

## 2024-10-09 ENCOUNTER — Telehealth: Payer: Self-pay

## 2024-10-09 ENCOUNTER — Inpatient Hospital Stay: Admitting: Oncology

## 2024-10-09 ENCOUNTER — Telehealth (HOSPITAL_COMMUNITY): Payer: Self-pay | Admitting: Surgery

## 2024-10-09 ENCOUNTER — Encounter: Payer: Self-pay | Admitting: Oncology

## 2024-10-09 ENCOUNTER — Ambulatory Visit
Admission: RE | Admit: 2024-10-09 | Discharge: 2024-10-09 | Disposition: A | Discharge status: Home / Self Care | Source: Ambulatory Visit | Attending: Radiation Oncology | Admitting: Radiation Oncology

## 2024-10-09 ENCOUNTER — Other Ambulatory Visit: Payer: Self-pay

## 2024-10-09 ENCOUNTER — Encounter (HOSPITAL_COMMUNITY): Admission: RE | Admit: 2024-10-09 | Source: Ambulatory Visit

## 2024-10-09 VITALS — BP 136/72 | HR 76 | Temp 97.7°F | Resp 17 | Wt 238.8 lb

## 2024-10-09 DIAGNOSIS — C12 Malignant neoplasm of pyriform sinus: Secondary | ICD-10-CM

## 2024-10-09 DIAGNOSIS — G893 Neoplasm related pain (acute) (chronic): Secondary | ICD-10-CM | POA: Diagnosis not present

## 2024-10-09 DIAGNOSIS — Z510 Encounter for antineoplastic radiation therapy: Secondary | ICD-10-CM | POA: Diagnosis not present

## 2024-10-09 LAB — BASIC METABOLIC PANEL - CANCER CENTER ONLY
Anion gap: 5 (ref 5–15)
BUN: 14 mg/dL (ref 8–23)
CO2: 27 mmol/L (ref 22–32)
Calcium: 9.4 mg/dL (ref 8.9–10.3)
Chloride: 103 mmol/L (ref 98–111)
Creatinine: 0.97 mg/dL (ref 0.61–1.24)
GFR, Estimated: >60 mL/min (ref >60)
Glucose, Bld: 163 mg/dL — ABNORMAL HIGH (ref 70–99)
Potassium: 4 mmol/L (ref 3.5–5.1)
Sodium: 135 mmol/L (ref 135–145)

## 2024-10-09 LAB — CBC WITH DIFFERENTIAL (CANCER CENTER ONLY)
Abs Immature Granulocytes: 0.07 K/uL (ref 0.00–0.07)
Basophils Absolute: 0.1 K/uL (ref 0.0–0.1)
Basophils Relative: 1 %
Eosinophils Absolute: 0.1 K/uL (ref 0.0–0.5)
Eosinophils Relative: 2 %
HCT: 37.8 % — ABNORMAL LOW (ref 39.0–52.0)
Hemoglobin: 12.6 g/dL — ABNORMAL LOW (ref 13.0–17.0)
Immature Granulocytes: 1 %
Lymphocytes Relative: 19 %
Lymphs Abs: 1.6 K/uL (ref 0.7–4.0)
MCH: 30.2 pg (ref 26.0–34.0)
MCHC: 33.3 g/dL (ref 30.0–36.0)
MCV: 90.6 fL (ref 80.0–100.0)
Monocytes Absolute: 0.9 K/uL (ref 0.1–1.0)
Monocytes Relative: 10 %
Neutro Abs: 5.7 K/uL (ref 1.7–7.7)
Neutrophils Relative %: 67 %
Platelet Count: 300 K/uL (ref 150–400)
RBC: 4.17 MIL/uL — ABNORMAL LOW (ref 4.22–5.81)
RDW: 13.1 % (ref 11.5–15.5)
WBC Count: 8.4 K/uL (ref 4.0–10.5)
nRBC: 0 % (ref 0.0–0.2)

## 2024-10-09 LAB — RAD ONC ARIA SESSION SUMMARY
Course Elapsed Days: 6
Plan Fractions Treated to Date: 5
Plan Prescribed Dose Per Fraction: 2 Gy
Plan Total Fractions Prescribed: 35
Plan Total Prescribed Dose: 70 Gy
Reference Point Dosage Given to Date: 10 Gy
Reference Point Session Dosage Given: 2 Gy
Session Number: 5

## 2024-10-09 LAB — MAGNESIUM: Magnesium: 2.1 mg/dL (ref 1.7–2.4)

## 2024-10-09 MED FILL — Fosaprepitant Dimeglumine For IV Infusion 150 MG (Base Eq): INTRAVENOUS | Qty: 5 | Status: AC

## 2024-10-09 NOTE — Assessment & Plan Note (Addendum)
 Please review oncology history for additional details and timeline of events.  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  Patient declined surgical option.  Hence plan made to proceed with concurrent chemoradiation.  Renal function is fluctuating and this needs to be monitored closely.  The cancer has a high risk of recurrence, necessitating aggressive treatment.  We have discussed about role of cisplatin being a radiosensitizer in the treatment of head and neck cancer.  We have discussed about the curative intent of chemoradiation for this patient.     We have discussed about mechanism of action of cisplatin, adverse effects of cisplatin including but not limited to fatigue, nausea, vomiting, increased risk of infections, mucositis, ototoxicity, nephrotoxicity, peripheral neuropathy.  Patient understands that some of the side effects can be permanent and even potentially fatal.Patient is willing to proceed with weekly cisplatin.  - Plan is to use cisplatin as the preferred chemotherapy agent, contingent on adequate kidney function due to nephrotoxicity.  Other options include weekly docetaxel or weekly carboplatin and paclitaxel, if kidney function does not permit cisplatin use.  - Patient declined feeding tube placement at this time.  - Encourage hydration to maintain kidney function, especially if not using a feeding tube.  Appropriate referrals were previously made for supportive care including physical therapy, speech pathology, nutrition.  Labs today reveal no dose-limiting toxicities.  Creatinine is better at 0.97.  We will proceed with cycle 2 of cisplatin at 40 mg/m dose tomorrow, 10/10/2024.   RTC in 1 week for labs, follow-up and continuation of chemotherapy.

## 2024-10-09 NOTE — Progress Notes (Signed)
 [facility]  ONCOLOGY CLINIC PROGRESS NOTE   Patient Care Team: Myra Geni ORN, FNP as PCP - General (Family Medicine) Court Dorn PARAS, MD as PCP - Cardiology (Cardiology) Malmfelt, Delon CROME, RN as Oncology Nurse Navigator Izell Domino, MD as Consulting Physician (Radiation Oncology) Autumn Millman, MD as Consulting Physician (Oncology) Tobie Eldora NOVAK, MD as Consulting Physician (Otolaryngology)  PATIENT NAME: Dave Williams   MR#: [id number] DOB: 02-20-1956  Date of visit: 10/09/2024   ASSESSMENT & PLAN:   Dave Williams is a 68 y.o. 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   Cancer of pyriform sinus (HCC) Please review oncology history for additional details and timeline of events.  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  Patient declined surgical option.  Hence plan made to proceed with concurrent chemoradiation.  Renal function is fluctuating and this needs to be monitored closely.  The cancer has a high risk of recurrence, necessitating aggressive treatment.  We have discussed about role of cisplatin being a radiosensitizer in the treatment of head and neck cancer.  We have discussed about the curative intent of chemoradiation for this patient.     We have discussed about mechanism of action of cisplatin, adverse effects of cisplatin including but not limited to fatigue, nausea, vomiting, increased risk of infections, mucositis, ototoxicity, nephrotoxicity, peripheral neuropathy.  Patient understands that some of the side effects can be permanent and even potentially fatal.Patient is willing to proceed with weekly  cisplatin.  - Plan is to use cisplatin as the preferred chemotherapy agent, contingent on adequate kidney function due to nephrotoxicity.  Other options include weekly docetaxel or weekly carboplatin and paclitaxel, if kidney function does not permit cisplatin use.  - Patient declined feeding tube placement at this time.  - Encourage hydration to maintain kidney function, especially if not using a feeding tube.  Appropriate referrals were previously made for supportive care including physical therapy, speech pathology, nutrition.  Labs today reveal no dose-limiting toxicities.  Creatinine is better at 0.97.  We will proceed with cycle 2 of cisplatin at 40 mg/m dose tomorrow, 10/10/2024.   RTC in 1 week for labs, follow-up and continuation of chemotherapy.  Cancer associated pain Experiencing intermittent pain managed with hydrocodone  7.5 mg and acetaminophen . Pain is variable, sometimes more like a headache. Prefers to avoid stronger medications to prevent dependency. Using baking soda and salt mouthwash for oral discomfort. - Continue hydrocodone  7.5 mg and acetaminophen  as needed for pain. - Use baking soda and salt mouthwash for oral discomfort. - Monitor pain levels and contact provider if pain becomes unmanageable.    I reviewed lab results and outside records for this visit and discussed relevant results with the patient. Diagnosis, plan of care and treatment options were also discussed in detail with the patient. Opportunity provided to ask questions and answers provided to his apparent satisfaction. Provided instructions to call our clinic with any problems, questions or concerns prior to return visit. I recommended to continue follow-up with PCP and sub-specialists. He verbalized understanding and agreed with the plan.   NCCN guidelines have been consulted in the planning of this patient's care.  I spent a total of 40 minutes during this encounter with  the patient including  review of chart and various tests results, discussions about plan of care and coordination of care plan.   Chinita Patten, MD  10/09/2024 2:06 PM  [facility] - A DEPT OF [facility] [address] [place] [address] Dept: [phone] Dept Fax: [phone]    CHIEF COMPLAINT/ REASON FOR VISIT:   Squamous cell carcinoma of the pyriform sinus, cT2, cN3, cM0, stage IV B.   Current Treatment:  Concurrent chemoradiation with weekly cisplatin started from 10/03/2024  INTERVAL HISTORY:    Discussed the use of AI scribe software for clinical note transcription with the patient, who gave verbal consent to proceed.  History of Present Illness Dave Williams is a 68 year old male undergoing chemotherapy who presents for a follow-up visit.  He is currently undergoing chemotherapy and reports no major side effects. He feels 'surprisingly okay.' His creatinine level is 0.97.  He is scheduled for radiation therapy today but is frustrated with the timing, as he does not want to wait for an hour and a half.  He experiences some pain, managed with hydrocodone  7.5 mg, which works intermittently. He sometimes combines it with 1000 mg of acetaminophen . He has headaches across the back of his head and body aches, which he attributes to a 'thing on his neck.'  He notes a change in his voice and soreness in his neck, especially when lying down, which he attributes to radiation treatment. He uses a baking soda and salt mouthwash and has been prescribed a lidocaine  rinse to help with swallowing, although he has not yet picked it up.  He tries to maintain his activity level but acknowledges the need to rest when necessary. He recounts an episode of fever after sleeping under a blanket in a warm room, resulting in a temperature of 102F.  In terms of nutrition, he has switched from Boost to [employer] for cost reasons  and is consuming the high-protein version. He also enjoys pork roll, a comfort food from his childhood, which his sister brought him.   I have reviewed the past medical history, past surgical history, social history and family history with the patient and they are unchanged from previous note.  HISTORY OF PRESENT ILLNESS:   ONCOLOGY HISTORY:   He presented to his PCP with complains of a recurrent right neck mass.   Patient had US  neck on 06/05/24 showing enlarged lymph nodes in right superior cervical chain measuring 5.1 x 2.1 x 3.0 cm and 2.5 x 1.4 x 1.6 cm. CT soft tissue neck was performed on 06/16/24 showed a 2 cm supraglottic mass to the right of midline consistent with a malignancy and bulky malignant lymphadenopathy on the right at level 2 and level 3, with indistinct margins consistent with extra capsular spread. The upper level 2 node shows a diameter of 2.2 x 2.8 cm. The lower level 2 node measures approximately 28 x 17 mm. Level 3 lymph node similarly with indistinct margins shows diameter of 21 x 19 mm.    His PCP referred him to our clinic for further evaluation of neck lymphadenopathy.  He was seen in our rapid diagnostic clinic on 06/19/2024.  CBCD was unremarkable.  Flow cytometry of peripheral blood was also unremarkable.  ESR, CRP, LDH were within normal limits.  HIV, hepatitis panel testing was negative.   To further investigate his symptoms, he was referred to Dr. Tobie on 06/26/24. At that  time, patient recalled noticing a right neck mass last year that eventually resolved on its own. However, in January 2025, mass had reoccurred and had increased in size, causing  discomfort in the area.    He then underwent a Transnasal Fiberoptic Laryngoscopy indicating a hypopharynx neck mass involving the pyriform and likely arytenoid submucosally concerning for malignancy. Vocal folds were mobile bilaterally. No lesions on the free edge of the vocal folds. No airway compromise concern    Subsequently, he underwent a right neck mass biopsy on 07/24/24 which revealed invasive squamous cell carcinoma.         He presented for a follow up with Dr. Tobie on 07/31/24 to discuss further treatment options. Patient expressed no interest in surgical interventions. He would like to consider chemoradiation.    PET scan performed on 08/01/24 showed the right eccentric supraglottic mass has a maximum SUV of 6.9 along with hypermetabolic activity in all identified lymph nodes. Scan also noted several scattered pulmonary nodules with the largest measuring 0.4 x 0.5 cm with surveillance recommended as nodules are small in size to detect hypermetabolic activity.     Family history includes his father having esophageal cancer that metastasized to the spine, his mother having skin and anal cancer, and his sister having thyroid  cancer.    He had consultation with Dr. Izell on 08/06/2024.  Plan made for 7 weeks of radiation therapy and he was referred to us  for concurrent chemotherapy.   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  Patient declined surgical option.  Hence plan made to proceed with concurrent chemoradiation using weekly cisplatin.  Renal function is fluctuating and this needs to be monitored closely.   Further chart review shows patient established care with GI on 06/10/24 after having a positive cologuard test. It was recommended patient have colonoscopy however he prefers to hold off while undergoing oncology work up. GI will reach out to patient in 2 months to schedule.   Started concurrent chemoradiation with weekly cisplatin from 10/03/2024.  Oncology History  Cancer of pyriform sinus (HCC)  08/07/2024 Initial Diagnosis   Cancer of pyriform sinus (HCC)   08/07/2024 Cancer Staging   Staging form: Pharynx - Hypopharynx, AJCC 8th Edition - Clinical stage from 08/07/2024: Stage IVB (cT2, cN3, cM0) - Signed by Izell Domino, MD on  08/07/2024 Stage prefix: Initial diagnosis   10/03/2024 -  Chemotherapy   Patient is on Treatment Plan : HEAD/NECK Cisplatin (40) q7d         REVIEW OF SYSTEMS:   Review of Systems - Oncology  All other pertinent systems were reviewed with the patient and are negative.  ALLERGIES: He has no known allergies.  MEDICATIONS:  Current Outpatient Medications  Medication Sig Dispense Refill   acetaminophen  (TYLENOL ) 500 MG tablet Take 1,000 mg by mouth every 6 (six) hours as needed (pain.).     amLODipine  (NORVASC ) 10 MG tablet Take 1 tablet (10 mg total) by mouth daily. 90 tablet 2   aspirin  EC 81 MG tablet Take 1 tablet (81 mg total) by mouth daily. Swallow whole.     atorvastatin  (LIPITOR ) 80 MG tablet Take 1 tablet (80 mg total) by mouth daily with supper. 90 tablet 3   dexamethasone  (DECADRON ) 4 MG tablet Take 2 tablets (8 mg) by mouth daily x 3 days starting the day after cisplatin chemotherapy. Take with food. 30 tablet 1   HYDROcodone -acetaminophen  (NORCO) 7.5-325 MG tablet Take 1 tablet by mouth  every 6 (six) hours as needed for moderate pain (pain score 4-6). 60 tablet 0   isosorbide  mononitrate (IMDUR ) 30 MG 24 hr tablet Take 1 tablet (30 mg total) by mouth daily. 90 tablet 1   lidocaine  (XYLOCAINE ) 2 % solution Patient: Mix 1part 2% viscous lidocaine , 1part H20. Swish & swallow 10mL of diluted mixture, 30min before meals and at bedtime, up to QID 200 mL 3   lidocaine -prilocaine  (EMLA ) cream Apply to affected area once 30 g 3   nitroGLYCERIN  (NITROSTAT ) 0.4 MG SL tablet Place 1 tablet (0.4 mg total) under the tongue every 5 (five) minutes as needed for chest pain. DISSOLVE 1 TABLET UNDER THE TONGUE EVERY 5 MINUTES AS NEEDED FOR CHEST PAIN. DO NOT EXCEED A TOTAL OF 3 DOSES IN 15 MINUTES. 25 tablet 6   ondansetron  (ZOFRAN ) 8 MG tablet Take 1 tablet (8 mg total) by mouth every 8 (eight) hours as needed for nausea or vomiting. Start on the third day after cisplatin. 30 tablet 1    pantoprazole  (PROTONIX ) 40 MG tablet Take 1 tablet (40 mg total) by mouth daily. 30 tablet 0   prochlorperazine  (COMPAZINE ) 10 MG tablet Take 1 tablet (10 mg total) by mouth every 6 (six) hours as needed (Nausea or vomiting). 30 tablet 1   vitamin D3 (CHOLECALCIFEROL ) 25 MCG tablet Take 1 tablet (1,000 Units total) by mouth daily. 30 tablet 0   No current facility-administered medications for this visit.     VITALS:   Blood pressure 136/72, pulse 76, temperature 97.7 F (36.5 C), resp. rate 17, weight 238 lb 12.8 oz (108.3 kg), SpO2 99%.  Wt Readings from Last 3 Encounters:  10/09/24 238 lb 12.8 oz (108.3 kg)  10/02/24 243 lb (110.2 kg)  09/30/24 239 lb 4.8 oz (108.5 kg)    Body mass index is 31.51 kg/m.    Onc Performance Status - 10/09/24 1158       ECOG Perf Status   ECOG Perf Status Fully active, able to carry on all pre-disease performance without restriction      KPS SCALE   KPS % SCORE Normal, no compliants, no evidence of disease           PHYSICAL EXAM:   Physical Exam Constitutional:      General: He is not in acute distress.    Appearance: Normal appearance.  HENT:     Head: Normocephalic and atraumatic.  Eyes:     Conjunctiva/sclera: Conjunctivae normal.  Cardiovascular:     Rate and Rhythm: Normal rate and regular rhythm.  Pulmonary:     Effort: Pulmonary effort is normal. No respiratory distress.  Chest:     Comments: Port-A-Cath in place without signs of infection. Abdominal:     General: There is no distension.  Lymphadenopathy:     Cervical: Cervical adenopathy (Right Level 2/3 mass, fixed, >5cm) present.  Neurological:     General: No focal deficit present.     Mental Status: He is alert and oriented to person, place, and time.  Psychiatric:        Mood and Affect: Mood normal.        Behavior: Behavior normal.      LABORATORY DATA:   I have reviewed the data as listed.  Results for orders placed or performed in visit on 10/09/24   Rad Onc Aria Session Summary  Result Value Ref Range   Course ID [id number]    Course Start Date 09/23/2024    Session Number 5  Course First Treatment Date 10/03/2024  3:00 PM    Course Last Treatment Date 10/09/2024 12:29 PM    Course Elapsed Days 6    Reference Point ID HN DP    Reference Point Dosage Given to Date 10.00000005 Gy   Reference Point Session Dosage Given 2.00000001 Gy   Plan ID HN_HypoPhar    Plan Fractions Treated to Date 5    Plan Total Fractions Prescribed 35    Plan Prescribed Dose Per Fraction 2 Gy   Plan Total Prescribed Dose 70.000000 Gy   Plan Primary Reference Point HN DP   Results for orders placed or performed in visit on 10/09/24  CBC with Differential (Cancer Center Only)  Result Value Ref Range   WBC Count 8.4 4.0 - 10.5 K/uL   RBC 4.17 (L) 4.22 - 5.81 MIL/uL   Hemoglobin 12.6 (L) 13.0 - 17.0 g/dL   HCT 62.1 (L) 60.9 - 47.9 %   MCV 90.6 80.0 - 100.0 fL   MCH 30.2 26.0 - 34.0 pg   MCHC 33.3 30.0 - 36.0 g/dL   RDW 86.8 88.4 - 84.4 %   Platelet Count 300 150 - 400 K/uL   nRBC 0.0 0.0 - 0.2 %   Neutrophils Relative % 67 %   Neutro Abs 5.7 1.7 - 7.7 K/uL   Lymphocytes Relative 19 %   Lymphs Abs 1.6 0.7 - 4.0 K/uL   Monocytes Relative 10 %   Monocytes Absolute 0.9 0.1 - 1.0 K/uL   Eosinophils Relative 2 %   Eosinophils Absolute 0.1 0.0 - 0.5 K/uL   Basophils Relative 1 %   Basophils Absolute 0.1 0.0 - 0.1 K/uL   Immature Granulocytes 1 %   Abs Immature Granulocytes 0.07 0.00 - 0.07 K/uL  Basic Metabolic Panel - Cancer Center Only  Result Value Ref Range   Sodium 135 135 - 145 mmol/L   Potassium 4.0 3.5 - 5.1 mmol/L   Chloride 103 98 - 111 mmol/L   CO2 27 22 - 32 mmol/L   Glucose, Bld 163 (H) 70 - 99 mg/dL   BUN 14 8 - 23 mg/dL   Creatinine 9.02 9.38 - 1.24 mg/dL   Calcium  9.4 8.9 - 10.3 mg/dL   GFR, Estimated >39 >39 mL/min   Anion gap 5 5 - 15  Magnesium   Result Value Ref Range   Magnesium  2.1 1.7 - 2.4 mg/dL      RADIOGRAPHIC  STUDIES:  I have personally reviewed the radiological images as listed and agree with the findings in the report.  IR IMAGING GUIDED PORT INSERTION Result Date: 09/25/2024 CLINICAL DATA:  Squamous cell cancer of the hypopharynx. Chest port placement for therapy. EXAM: Chest port catheter placement TECHNIQUE: Procedure performed using fluoroscopy and ultrasound CONTRAST:  None RADIOPHARMACEUTICALS:  None FLUOROSCOPY: 1 mGy COMPARISON:  None FINDINGS: The patient was placed in supine position on the IR gantry and the right upper chest and neck were prepped and draped in the usual sterile fashion. The nurse administered intravenous fentanyl  and Versed  under my supervision and the nurse had no other injuries other than monitoring the patient and administering medications. I was present for the entire duration of procedure. 175 mcg intravenous fentanyl  and 3.5 mg intravenous Versed  were administered for a total sedation time of 40 minutes. Ultrasound guidance was used to investigate the right internal jugular vein which was anechoic and compressible indicating patency. The needle was then advanced from a scan negative through the soft tissue into the right internal  jugular vein under ultrasound guidance. A final image was obtained and stored in the patient's permanent medical record. Access was then exchanged over a guidewire which was advanced under fluoroscopic guidance. The needle was removed and replaced with a micropuncture sheath. Approximately 2 inches below the clavicle the port pocket was created with a subsequent incision. The catheter was then tunneled from the port pocket to the venotomy site overlying the right internal jugular vein. Access was then exchanged over an 035 guidewire for peel-away sheath which was advanced over the guidewire under fluoroscopic guidance. The catheter was then advanced through the peel-away sheath to the sinoatrial junction. Sheath was removed. The catheter was then cut at  the port pocket and connected to chest port. The chest port was tested for function and finally function well. The chest port was then flushed with heparin  and a port pocket was closed with 4-0 suture. Final image was obtained demonstrating satisfactory position of chest port. The final count of all materials was satisfactory. IMPRESSION: 1. Satisfactory placement of right internal jugular vein single-lumen chest port. The catheter tip is at the cavoatrial junction. 2.  Okay to use and power inject chest port. Electronically Signed   By: Cordella Banner   On: 09/25/2024 15:39     CODE STATUS:  Code Status History     Date Active Date Inactive Code Status Order ID Comments User Context   09/25/2024 1523 09/26/2024 0524 Full Code [id number]  Banner Cordella LABOR, MD HOV   03/13/2024 1550 03/21/2024 1532 Full Code [id number]  Maurice Sharlet GORMAN DEVONNA Inpatient   03/04/2024 1247 03/13/2024 1548 Full Code [id number]  Viviane Lemond BRAVO, PA-C Inpatient   11/28/2023 1625 11/28/2023 2356 Full Code [id number]  Jordan, Peter M, MD Inpatient    Questions for Most Recent Historical Code Status (Order [id number])     Question Answer   By: Consent: discussion documented in EHR            No orders of the defined types were placed in this encounter.    Future Appointments  Date Time Provider Department Center  10/10/2024  8:00 AM [facility] INFUSION [facility] None  10/10/2024  2:30 PM Ivonne Harlene GORMAN, RD [facility] None  10/10/2024  3:00 PM [facility] LINAC 4 [facility] None  10/11/2024  9:15 AM [facility] REH[facility] PROGRAM SESSION [facility]  10/11/2024 11:15 AM [facility] LINAC 4 [facility] None  10/14/2024 11:15 AM [facility] LINAC 4 [facility] None  10/14/2024 11:30 AM LINAC-SQUIRE [facility] None  10/15/2024 10:15 AM [facility] LINAC 4 [facility] None  10/16/2024  9:00 AM [facility] FLUSH [facility] None  10/16/2024  9:30 AM Madai Nuccio, MD [facility] None  10/16/2024 10:15 AM [facility]  LINAC 4 [facility] None  10/17/2024  7:30 AM [facility] INFUSION [facility] None  10/17/2024  9:00 AM Ivonne Harlene GORMAN, RD [facility] None  10/17/2024  2:15 PM [facility] [id number] [facility] None  10/18/2024 10:15 AM [facility] LINAC 4 [facility] None  10/21/2024 10:15 AM [facility] LINAC 4 [facility] None  10/22/2024 10:15 AM [facility] LINAC 4 [facility] None  10/23/2024 10:15 AM [facility] LINAC 4 [facility] None  10/24/2024 10:15 AM [facility] LINAC 4 [facility] None  10/25/2024 10:15 AM [facility] LINAC 4 [facility] None  10/28/2024 10:15 AM [facility] LINAC 4 [facility] None  10/29/2024 10:15 AM [facility] LINAC 4 [facility] None  10/30/2024 10:15 AM [facility] LINAC 4 [facility] None  10/31/2024 10:15 AM [facility] LINAC 4 [facility] None  11/01/2024 10:15 AM [facility] LINAC 4 [facility] None  11/04/2024 10:15 AM [facility] LINAC 4 [facility]  None  11/05/2024 10:15 AM [facility] LINAC 4 [facility] None  11/06/2024 10:15 AM [facility] LINAC 4 [facility] None  11/11/2024 10:15 AM [facility] LINAC 4 [facility] None  11/12/2024 10:15 AM [facility] LINAC 4 [facility] None  11/13/2024 10:15 AM [facility] LINAC 4 [facility] None  11/14/2024 10:15 AM [facility] LINAC 4 [facility] None  11/15/2024 10:15 AM [facility] LINAC 4 [facility] None  11/18/2024 10:15 AM [facility] LINAC 4 [facility] None  11/19/2024 10:15 AM [facility] LINAC 4 [facility] None  11/20/2024 10:15 AM [facility] LINAC 4 [facility] None  11/21/2024 10:15 AM [facility] LINAC 4 [facility] None  11/22/2024 10:15 AM [facility] LINAC 4 [facility] None  01/22/2025 10:30 AM Fountain, Madison L, NP [facility]     This document was completed utilizing speech recognition software. Grammatical errors, random word insertions, pronoun errors, and incomplete sentences are an occasional consequence of this system due to software limitations, ambient noise, and hardware issues. Any  formal questions or concerns about the content, text or information contained within the body of this dictation should be directly addressed to the provider for clarification.

## 2024-10-09 NOTE — Telephone Encounter (Signed)
 [facility] Clinical Social Work  Clinical Social Work was referred by statistician for assessment of psychosocial needs.  Clinical Social Worker contacted patient by phone to offer support and assess for needs.   Patient unable to attend 10/29 appt with social worker. CSW will attempt to speak with patient on 10/31   Lizbeth Sprague, LCSW  Clinical Social Worker [facility]

## 2024-10-09 NOTE — Telephone Encounter (Signed)
 I called the pt since he missed his cardiac rehab session today.  He stated that he is now having daily radiation treatments and chemo every Thursday.  He stated that he will not be able to attend his rehab sessions on Wednesdays any more, but he should be able to come Mondays and Fridays.  He plans to come back to cardiac rehab this Friday.  I told the pt to call our office back if he is not able to attend Friday, and the pt verbalized understanding.

## 2024-10-09 NOTE — Assessment & Plan Note (Addendum)
 Experiencing intermittent pain managed with hydrocodone  7.5 mg and acetaminophen . Pain is variable, sometimes more like a headache. Prefers to avoid stronger medications to prevent dependency. Using baking soda and salt mouthwash for oral discomfort. - Continue hydrocodone  7.5 mg and acetaminophen  as needed for pain. - Use baking soda and salt mouthwash for oral discomfort. - Monitor pain levels and contact provider if pain becomes unmanageable.

## 2024-10-10 ENCOUNTER — Other Ambulatory Visit: Payer: Self-pay | Admitting: Medical Genetics

## 2024-10-10 ENCOUNTER — Other Ambulatory Visit: Payer: Self-pay | Admitting: *Deleted

## 2024-10-10 ENCOUNTER — Inpatient Hospital Stay

## 2024-10-10 ENCOUNTER — Inpatient Hospital Stay: Admitting: Dietician

## 2024-10-10 ENCOUNTER — Other Ambulatory Visit: Payer: Self-pay

## 2024-10-10 ENCOUNTER — Ambulatory Visit
Admission: RE | Admit: 2024-10-10 | Discharge: 2024-10-10 | Disposition: A | Source: Ambulatory Visit | Attending: Radiation Oncology

## 2024-10-10 VITALS — BP 115/74 | HR 81 | Temp 97.6°F | Resp 16 | Ht 73.0 in | Wt 238.0 lb

## 2024-10-10 DIAGNOSIS — G893 Neoplasm related pain (acute) (chronic): Secondary | ICD-10-CM

## 2024-10-10 DIAGNOSIS — Z510 Encounter for antineoplastic radiation therapy: Secondary | ICD-10-CM | POA: Diagnosis not present

## 2024-10-10 DIAGNOSIS — Z006 Encounter for examination for normal comparison and control in clinical research program: Secondary | ICD-10-CM

## 2024-10-10 DIAGNOSIS — C12 Malignant neoplasm of pyriform sinus: Secondary | ICD-10-CM

## 2024-10-10 LAB — RAD ONC ARIA SESSION SUMMARY
Course Elapsed Days: 7
Plan Fractions Treated to Date: 6
Plan Prescribed Dose Per Fraction: 2 Gy
Plan Total Fractions Prescribed: 35
Plan Total Prescribed Dose: 70 Gy
Reference Point Dosage Given to Date: 12 Gy
Reference Point Session Dosage Given: 2 Gy
Session Number: 6

## 2024-10-10 MED ORDER — DEXAMETHASONE SOD PHOSPHATE PF 10 MG/ML IJ SOLN
10.0000 mg | Freq: Once | INTRAMUSCULAR | Status: AC
  Administered 2024-10-10: 10 mg via INTRAVENOUS

## 2024-10-10 MED ORDER — POTASSIUM CHLORIDE IN NACL 20-0.9 MEQ/L-% IV SOLN
Freq: Once | INTRAVENOUS | Status: AC
  Filled 2024-10-10: qty 1000

## 2024-10-10 MED ORDER — SODIUM CHLORIDE 0.9 % IV SOLN: INTRAVENOUS | Status: DC

## 2024-10-10 MED ORDER — SODIUM CHLORIDE 0.9 % IV SOLN
150.0000 mg | Freq: Once | INTRAVENOUS | Status: AC
  Administered 2024-10-10: 150 mg via INTRAVENOUS
  Filled 2024-10-10: qty 150

## 2024-10-10 MED ORDER — SODIUM CHLORIDE 0.9 % IV SOLN
40.0000 mg/m2 | Freq: Once | INTRAVENOUS | Status: AC
  Administered 2024-10-10: 100 mg via INTRAVENOUS
  Filled 2024-10-10: qty 100

## 2024-10-10 MED ORDER — PALONOSETRON HCL INJECTION 0.25 MG/5ML
0.2500 mg | Freq: Once | INTRAVENOUS | Status: AC
  Administered 2024-10-10: 0.25 mg via INTRAVENOUS
  Filled 2024-10-10: qty 5

## 2024-10-10 MED ORDER — MAGNESIUM SULFATE 2 GM/50ML IV SOLN
2.0000 g | Freq: Once | INTRAVENOUS | Status: AC
  Administered 2024-10-10: 2 g via INTRAVENOUS
  Filled 2024-10-10: qty 50

## 2024-10-10 MED ORDER — ACETAMINOPHEN 325 MG PO TABS
650.0000 mg | ORAL_TABLET | Freq: Once | ORAL | Status: AC
  Administered 2024-10-10: 650 mg via ORAL
  Filled 2024-10-10: qty 2

## 2024-10-10 NOTE — Progress Notes (Signed)
 Nutrition Follow-up:  Pt with stage IV cancer of pyriform sinus. He is receiving concurrent chemoradiation with weekly cisplatin (start 10/23). Patient followed by Dr. Autumn and Dr. Izell.   Met with patient in infusion. He is doing well today. Reports tolerating first chemotherapy without side effects. His throat is mildly sore. Patient tolerating regular diet without difficulty. His sister was in town over the weekend. Reports eating well (sandwiches, pork roll, meatloaf, lasagna). He is drinking 2 CIB breakfast and having peanut butter/jelly sandwich with glass of milk for bedtime snack.   Medications: reviewed   Labs: glucose 163  Anthropometrics: Wt 238 lb 12.8 oz on 10/30  10/22 - 243 lb 10/20 - 239 lb 4.8 oz  10/13 - 239 lb 3.2 oz    Estimated Energy Needs  Kcals: 2700-3080 Protein: 132-154 Fluid: 3 L  NUTRITION DIAGNOSIS: Food and nutrition related knowledge deficit improving    INTERVENTION:  Continue 2 CIB in between meals - samples of Ensure Complete and Boost VHC provided Continue bedtime snack    MONITORING, EVALUATION, GOAL: wt trends, intake    NEXT VISIT: Thursday November 6 during infusion

## 2024-10-10 NOTE — Patient Instructions (Signed)

## 2024-10-11 ENCOUNTER — Encounter (HOSPITAL_COMMUNITY)

## 2024-10-11 ENCOUNTER — Ambulatory Visit

## 2024-10-14 ENCOUNTER — Ambulatory Visit
Admission: RE | Admit: 2024-10-14 | Discharge: 2024-10-14 | Disposition: A | Discharge status: Home / Self Care | Source: Ambulatory Visit | Attending: Radiation Oncology | Admitting: Radiation Oncology

## 2024-10-14 ENCOUNTER — Encounter (HOSPITAL_COMMUNITY)
Admission: RE | Admit: 2024-10-14 | Discharge: 2024-10-14 | Disposition: A | Discharge status: Home / Self Care | Source: Ambulatory Visit | Attending: Cardiovascular Disease | Admitting: Cardiovascular Disease

## 2024-10-14 ENCOUNTER — Encounter: Payer: Self-pay | Admitting: Radiology

## 2024-10-14 ENCOUNTER — Other Ambulatory Visit: Payer: Self-pay

## 2024-10-14 DIAGNOSIS — Z808 Family history of malignant neoplasm of other organs or systems: Secondary | ICD-10-CM | POA: Insufficient documentation

## 2024-10-14 DIAGNOSIS — Z951 Presence of aortocoronary bypass graft: Secondary | ICD-10-CM | POA: Insufficient documentation

## 2024-10-14 DIAGNOSIS — Z79891 Long term (current) use of opiate analgesic: Secondary | ICD-10-CM | POA: Insufficient documentation

## 2024-10-14 DIAGNOSIS — R519 Headache, unspecified: Secondary | ICD-10-CM | POA: Insufficient documentation

## 2024-10-14 DIAGNOSIS — C12 Malignant neoplasm of pyriform sinus: Secondary | ICD-10-CM | POA: Insufficient documentation

## 2024-10-14 DIAGNOSIS — G893 Neoplasm related pain (acute) (chronic): Secondary | ICD-10-CM | POA: Insufficient documentation

## 2024-10-14 DIAGNOSIS — M542 Cervicalgia: Secondary | ICD-10-CM | POA: Insufficient documentation

## 2024-10-14 DIAGNOSIS — Z5111 Encounter for antineoplastic chemotherapy: Secondary | ICD-10-CM | POA: Insufficient documentation

## 2024-10-14 DIAGNOSIS — Z510 Encounter for antineoplastic radiation therapy: Secondary | ICD-10-CM | POA: Insufficient documentation

## 2024-10-14 LAB — RAD ONC ARIA SESSION SUMMARY
Course Elapsed Days: 11
Plan Fractions Treated to Date: 7
Plan Prescribed Dose Per Fraction: 2 Gy
Plan Total Fractions Prescribed: 35
Plan Total Prescribed Dose: 70 Gy
Reference Point Dosage Given to Date: 14 Gy
Reference Point Session Dosage Given: 2 Gy
Session Number: 7

## 2024-10-14 NOTE — Progress Notes (Signed)
 Daily Session Note  Patient Details  Name: Dave Williams MRN: [id number] Date of Birth: 09/28/1956 Referring Provider:   Flowsheet Row CARDIAC REHAB PHASE II ORIENTATION from 05/20/2024 in [facility] CARDIAC REHABILITATION  Referring Provider Court Carrier MD    Encounter Date: 10/14/2024  Check In:  Session Check In - 10/14/24 0936       Check-In   Supervising physician immediately available to respond to emergencies See telemetry face sheet for immediately available MD    Location [facility]    Staff Present Powell Benders, BS, Exercise Physiologist;Jessica Vonzell, MA, RCEP, CCRP, CCET;Lerae Langham Jackquline, BSN, RN, WTA-C    Virtual Visit No    Medication changes reported     Yes    Comments Started chemo    Fall or balance concerns reported    No    Tobacco Cessation No Change    Warm-up and Cool-down Performed on first and last piece of equipment    Resistance Training Performed Yes    VAD Patient? No    PAD/SET Patient? No      Pain Assessment   Currently in Pain? No/denies          Capillary Blood Glucose: No results found for this or any previous visit (from the past 24 hours).    Social History   Tobacco Use  Smoking Status Former   Current packs/day: 0.50   Types: Cigarettes  Smokeless Tobacco Never    Goals Met:  Independence with exercise equipment Exercise tolerated well No report of concerns or symptoms today Strength training completed today  Goals Unmet:  Not Applicable  Comments: Pt able to follow exercise prescription today without complaint.  Will continue to monitor for progression.

## 2024-10-15 ENCOUNTER — Other Ambulatory Visit: Payer: Self-pay | Admitting: Oncology

## 2024-10-15 ENCOUNTER — Ambulatory Visit
Admission: RE | Admit: 2024-10-15 | Discharge: 2024-10-15 | Disposition: A | Discharge status: Home / Self Care | Source: Ambulatory Visit | Attending: Radiation Oncology | Admitting: Radiation Oncology

## 2024-10-15 ENCOUNTER — Other Ambulatory Visit: Payer: Self-pay

## 2024-10-15 DIAGNOSIS — G893 Neoplasm related pain (acute) (chronic): Secondary | ICD-10-CM

## 2024-10-15 LAB — RAD ONC ARIA SESSION SUMMARY
Course Elapsed Days: 12
Plan Fractions Treated to Date: 8
Plan Prescribed Dose Per Fraction: 2 Gy
Plan Total Fractions Prescribed: 35
Plan Total Prescribed Dose: 70 Gy
Reference Point Dosage Given to Date: 16 Gy
Reference Point Session Dosage Given: 2 Gy
Session Number: 8

## 2024-10-15 NOTE — Therapy (Signed)
 OUTPATIENT PHYSICAL THERAPY HEAD AND NECK BASELINE EVALUATION   Patient Name: Dave Williams MRN: [id number] DOB:Mar 22, 1956, 68 y.o., male Today's Date: 10/17/2024  END OF SESSION:  PT End of Session - 10/17/24 0940     Visit Number 1    Number of Visits 2    Date for Recertification  12/26/24    PT Start Time 0903    PT Stop Time 0938    PT Time Calculation (min) 35 min    Activity Tolerance Patient tolerated treatment well    Behavior During Therapy [facility] for tasks assessed/performed          Past Medical History:  Diagnosis Date   Anxiety    Chronic left hip pain    Compression fracture of spine (HCC)    multiple compression Fx with disc bulge   Coronary artery disease    Hypertension    Pneumonia    As a teenager   Stroke [facility]) 2022   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   Past Surgical History:  Procedure Laterality Date   BACK SURGERY     Radiocartography T2-T12   CORONARY ARTERY BYPASS GRAFT N/A 03/04/2024   Procedure: CORONARY ARTERY BYPASS GRAFTING (CABG) TIMES THREE UTILIZING LEFT INTERNAL MAMMARY ARTERY AND ENDOSCOPIC VEIN HARVEST RIGHT GREATER SAPHENOUS VEIN;  Surgeon: Shyrl Linnie KIDD, MD;  Location: [facility] OR;  Service: Open Heart Surgery;  Laterality: N/A;   DIRECT LARYNGOSCOPY N/A 07/24/2024   Procedure: LARYNGOSCOPY, DIRECT;  Surgeon: Tobie Eldora NOVAK, MD;  Location: [facility] OR;  Service: ENT;  Laterality: N/A;  Direct Laryngoscopy with biopsy with use of operating telescope   IR IMAGING GUIDED PORT INSERTION  09/25/2024   KNEE ARTHROSCOPY Bilateral    LEFT HEART CATH AND CORONARY ANGIOGRAPHY N/A 11/28/2023   Procedure: LEFT HEART CATH AND CORONARY ANGIOGRAPHY;  Surgeon: Jordan, Peter M, MD;  Location: [facility] INVASIVE CV LAB;  Service: Cardiovascular;  Laterality: N/A;   PILONIDAL CYST EXCISION     In patients 20s   RIGID ESOPHAGOSCOPY N/A 07/24/2024   Procedure: ESOPHAGOSCOPY, RIGID;  Surgeon: Tobie Eldora NOVAK, MD;  Location: [facility] OR;  Service: ENT;  Laterality: N/A;   TEE WITHOUT CARDIOVERSION N/A 03/04/2024   Procedure: ECHOCARDIOGRAM, TRANSESOPHAGEAL;  Surgeon: Shyrl Linnie KIDD, MD;  Location: [facility] OR;  Service: Open Heart Surgery;  Laterality: N/A;   Patient Active Problem List   Diagnosis Date Noted   Cancer associated pain 08/08/2024   Cancer of pyriform sinus (HCC) 08/07/2024   Mass of hypopharynx 07/24/2024   Positive colorectal cancer screening using Cologuard test 06/10/2024   PAF (paroxysmal atrial fibrillation) (HCC) 04/22/2024   Acute blood loss anemia 03/21/2024   Leucocytosis 03/21/2024   Urinary retention with incomplete bladder emptying 03/21/2024   Debility 03/13/2024   S/P CABG x 3 03/04/2024   CAD (coronary artery disease) 12/22/2023   Chest pain of uncertain etiology 11/27/2023   Cerebrovascular accident (HCC) 07/05/2023   History of hypertension 07/05/2023   Hypercholesterolemia 07/05/2023    PCP: Geni Clause FNP  REFERRING PROVIDER: Izell Domino, MD  REFERRING DIAG: C13.9 (ICD-10-CM) - Squamous cell cancer of hypopharynx (HCC)  THERAPY DIAG:  Abnormal posture  Malignant neoplasm hypopharynx (HCC)  Rationale for Evaluation and Treatment: Rehabilitation  ONSET DATE: 07/24/24  SUBJECTIVE:     SUBJECTIVE STATEMENT: Patient reports they are here today to be seen by their medical team for newly diagnosed cancer of pyriform  sinus with cervical lymphadenopathy.    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 He presented to his PCP with complaints of a recurrent right neck mass. 06/26/24 He saw Dr. Tobie. He underwent a transnasal fiberoptic laryngoscopy indicating a hypopharynx mass involving the pyriform and likely arytenoid submucosally concerning for malignancy. 07/24/24 He underwent a right neck mass biopsy revealing Invasive SCC. 07/31/24 CT neck showed a 2 cm supraglottic mass to the right  of midline consistent with a malignancy along with a bulky malignant lymphadenopathy on the right at level 2 and level 3, with indistinct margins consistent with extra capsular spread. The upper level 2 node shows a diameter of 2.2 x 2.8 cm. The lower level 2 node measures approximately 28 x 17 mm. level 3 lymph node similarly with indistinct margins shows diameter of 21 x 19 mm. He followed up with Dr. Tobie and expressed no interest in surgical interventions. He will receive 35 fractions of radiation with weekly chemotherapy to his Laryngopharynx and bilateral neck which started on 10/03/24 and complete 11/25/24. 09/18/24 Extraction of all max teeth and posterior mand teeth (2,3,5,6,7,8,9,10,11,17,18,19,20,21,28,29 and 31) and debridement performed. 09/25/24 PAC. He has declined PEG against medical advice. He has quit smoking on 03/04/24 after smoking a pack a day for 50 years. 02/2024 underwent CABG  PATIENT GOALS:   to be educated about the signs and symptoms of lymphedema and learn post op HEP.   PAIN:  Are you having pain? Yes: NPRS scale: 3/10 Pain location: throat Pain description: sore Aggravating factors: swallowing  Relieving factors: pain medication  PRECAUTIONS: Active CA  RED FLAGS: 3 compression fractures, 5 herniated disc all between T2 and T12 all on the anterior side   WEIGHT BEARING RESTRICTIONS: No  FALLS:  Has patient fallen in last 6 months? Yes. Number of falls 1 after CABG 03/05/24- passed out Does the patient have a fear of falling that limits activity? No Is the patient reluctant to leave the house due to a fear of falling?No  LIVING ENVIRONMENT: Patient lives with: alone Lives in: House/apartment Has following equipment at home: Ramped entry  OCCUPATION: unemployed, disabled  LEISURE: was going to [employer] to swim 3 days a week, was also going to cardiac rehab  PRIOR LEVEL OF FUNCTION: Independent   OBJECTIVE: Note: Objective measures were completed at  Evaluation unless otherwise noted.  COGNITION: Overall cognitive status: Within functional limits for tasks assessed                  POSTURE:  Forward head and rounded shoulders posture  30 SEC SIT TO STAND: Unable to assess because receiving infusion  SHOULDER AROM:   WFL   CERVICAL AROM:   Percent limited  Flexion WFL  Extension WFL  Right lateral flexion WFL  Left lateral flexion WFL  Right rotation WFL  Left rotation 50% limited    (Blank rows=not tested)  GAIT: Unable to assess as pt is receiving chemo  PATIENT EDUCATION:  Education details: Neck ROM, importance of posture when sitting, standing and lying down, deep breathing, walking program and importance of staying active throughout treatment, CURE article on staying active, Why exercise? flyer, lymphedema and PT info Person educated: Patient Education method: Explanation, Demonstration, Handout Education comprehension: Patient verbalized understanding and returned demonstration  HOME EXERCISE PROGRAM: Patient was instructed today in a home exercise program today for head and neck range of motion exercises. These included active cervical flexion, active  cervical extension, active cervical rotation to each direction, upper trap stretch, and shoulder retraction. Patient was encouraged to do these 2-3 times a day, holding for 5 sec each and completing for 5 reps. Pt was educated that once this becomes easier then hold the stretches for 30-60 seconds.    ASSESSMENT:  CLINICAL IMPRESSION: Pt arrives to PT with recently diagnosed pyriform sinus with cervical lymphadenopathy cancer. He will receive 35 fractions of radiation with weekly chemotherapy to his Laryngopharynx and bilateral neck which started on 10/03/24 and complete 11/25/24. Pt's cervical ROM was [facility] except for L rotation which was 50% limited due to tumor. Educated pt about signs and symptoms of lymphedema as well as anatomy and physiology of lymphatic  system. Educated pt in importance of staying as active as possible throughout treatment to decrease fatigue as well as head and neck ROM exercises to decrease loss of ROM. Will see pt after completion of radiation to reassess ROM and assess for lymphedema and to determine therapy needs at that time.  Pt will benefit from skilled therapeutic intervention to improve on the following deficits: Decreased knowledge of precautions and postural dysfunction. Other deficits: decreased ROM  PT treatment/interventions: ADL/self-care home management, pt/family education, therapeutic exercise. Other interventions [id number]- PT Re-evaluation, 97110-Therapeutic exercises, 97530- Therapeutic activity, [id number]- Neuromuscular re-education, 97535- Self Care, [id number]- Manual therapy, 97760- Orthotic Initial, [phone]- Orthotic/Prosthetic subsequent, and Patient/Family education  REHAB POTENTIAL: Good  CLINICAL DECISION MAKING: Evolving/moderate complexity  EVALUATION COMPLEXITY: Moderate   GOALS: Goals reviewed with patient? YES  LONG TERM GOALS: (STG=LTG)   Name Target Date  Goal status  1 Patient will be able to verbalize understanding of a home exercise program for cervical range of motion, posture, and walking.   Baseline:  No knowledge 10/17/2024 Achieved at eval  2 Patient will be able to verbalize understanding of proper sitting and standing posture. Baseline:  No knowledge 10/17/2024 Achieved at eval  3 Patient will be able to verbalize understanding of lymphedema risk and availability of treatment for this condition Baseline:  No knowledge 10/17/2024 Achieved at eval  4 Pt will demonstrate a return to full cervical ROM and function post operatively compared to baselines and not demonstrate any signs or symptoms of lymphedema.  Baseline: See objective measurements taken today. 12/26/24 New    PLAN:  PT FREQUENCY/DURATION: EVAL and 1 follow up appointment.   PLAN FOR NEXT SESSION: will reassess 2 weeks after  completion of radiation to determine needs.  Patient will follow up at outpatient cancer rehab 2 weeks after completion of radiation.  If the patient requires physical therapy at that time, a specific plan will be dictated and sent to the referring physician for approval. The patient was educated today on appropriate basic range of motion exercises to begin now and continue throughout radiation and educated on the signs and symptoms of lymphedema. Patient verbalized good understanding.     Physical Therapy Information for During and After Head/Neck Cancer Treatment: Lymphedema is a swelling condition that you may be at risk for in your neck and/or face if you have radiation treatment to the area and/or if you have surgery that includes removing lymph nodes.  There is treatment available for this condition and it is not life-threatening.  Contact your physician or physical therapist with concerns. An excellent resource for those seeking information on lymphedema is the National Lymphedema Network's website.  It can be accessed at www.lymphnet.org If you notice swelling in your neck or face at any time following  surgery (even if it is many years from now), please contact your doctor or physical therapist to discuss this.  Lymphedema can be treated at any time but it is easier for you if it is treated early on. If you have had surgery to your neck, please check with your surgeon about how soon to start doing neck range of motion exercises.  If you are not having surgery, I encourage you to start doing neck range of motion exercises today and continue these while undergoing treatment, UNLESS you have irritation of your skin or soft tissue that is aggravated by doing them.  These exercises are intended to help you prevent loss of range of motion and/or to gain range of motion in your neck (which can be limited by tightening effects of radiation), and NOT to aggravate these tissues if they develop sensitivities  from treatment. Neck range of motion exercises should be done to the point of feeling a GENTLE, TOLERABLE stretch only.  You are encouraged to start a walking or other exercise program tomorrow and continue this as much as you are able through and after treatment.  Please feel free to call me with any questions. Florina Lanis Carbon, PT, CLT Physical Therapist and Certified Lymphedema Therapist [facility] [address], [address], [place], [place] [address] [phone] [email or web address]  WALKING  Walking is a great form of exercise to increase your strength, endurance and overall fitness.  A walking program can help you start slowly and gradually build endurance as you go.  Everyone's ability is different, so each person's starting point will be different.  You do not have to follow them exactly.  The are just samples. You should simply find out what's right for you and stick to that program.   In the beginning, you'll start off walking 2-3 times a day for short distances.  As you get stronger, you'll be walking further at just 1-2 times per day.  A. You Can Walk For A Certain Length Of Time Each Day    Walk 5 minutes 3 times per day.  Increase 2 minutes every 2 days (3 times per day).  Work up to 25-30 minutes (1-2 times per day).   Example:   Day 1-2 5 minutes 3 times per day   Day 7-8 12 minutes 2-3 times per day   Day 13-14 25 minutes 1-2 times per day  B. You Can Walk For a Certain Distance Each Day     Distance can be substituted for time.    Example:   3 trips to mailbox (at road)   3 trips to corner of block   3 trips around the block  C. Go to local high school and use the track.    Walk for distance ____ around track  Or time ____ minutes  D. Walk ____ Jog ____ Run ___   Why exercise?  So many benefits! Here are SOME of them: Heart health, including raising your good cholesterol level and reducing heart rate and blood  pressure Lung health, including improved lung capacity It burns fats, and most of us  can stand to be leaner, whether or not we are overweight. It increases the body's natural painkillers and mood elevators, so makes you feel better. Not only makes you feel better, but look better too Improves sleep Takes a bite out of stress May decrease your risk of many types of cancer If you are currently undergoing cancer treatment, exercise may improve your ability to tolerate  treatments including chemotherapy. For everybody, it can improve your energy level. Those with cancer-related fatigue report a 40-50% reduction in this symptom when exercising regularly. If you are a survivor of breast, colon, or prostate cancer, it may decrease your risk of a recurrence. (This may hold for other cancers too, but so far we have data just for these three types.)  How to exercise: Get your doctor's okay. Pick something you enjoy doing, like walking, Zumba, biking, swimming, or whatever. Start at low intensity and time, then gradually increase.  (See walking program handout.) Set a goal to achieve over time.  The American Cancer Society, American Heart Association, and U.S. Dept. of Health and Human Services recommend 150 minutes of moderate exercise, 75 minutes of vigorous exercise, or a combination of both per week. This should be done in episodes at least 10 minutes long, spread throughout the week.  Need help being motivated? Pick something you enjoy doing, because you'll be more inclined to stick with that activity than something that feels like a chore. Do it with a friend so that you are accountable to each other. Schedule it into your day. Place it on your calendar and keep that appointment just like you do any appointment that you make. Join an exercise group that meets at a specific time.  That way, you have to show up on time, and that makes it harder to procrastinate about doing your workout.  It also keeps  you accountable--people begin to expect you to be there. Join a gym where you feel comfortable and not intimidated, at the right cost. Sign up for something that you'll need to be in shape for on a specific date, like a 1K or a 5K to walk or run, a 20 or 30 mile bike ride, a mud run or something like that. If the date is looming, you know you'll need to train to be ready for it.  An added benefit is that many of these are fundraisers for good causes. If you've already paid for a gym membership, group exercise class or event, you might as well work out, so you haven't wasted your money!    [facility], PT 10/17/2024, 9:49 AM

## 2024-10-16 ENCOUNTER — Encounter (HOSPITAL_COMMUNITY): Payer: Self-pay

## 2024-10-16 ENCOUNTER — Other Ambulatory Visit: Payer: Self-pay

## 2024-10-16 ENCOUNTER — Inpatient Hospital Stay (HOSPITAL_BASED_OUTPATIENT_CLINIC_OR_DEPARTMENT_OTHER): Admitting: Oncology

## 2024-10-16 ENCOUNTER — Encounter: Payer: Self-pay | Admitting: Oncology

## 2024-10-16 ENCOUNTER — Ambulatory Visit
Admission: RE | Admit: 2024-10-16 | Discharge: 2024-10-16 | Disposition: A | Discharge status: Home / Self Care | Source: Ambulatory Visit | Attending: Radiation Oncology | Admitting: Radiation Oncology

## 2024-10-16 ENCOUNTER — Inpatient Hospital Stay

## 2024-10-16 VITALS — BP 115/65 | HR 82 | Temp 98.4°F | Resp 17 | Ht 73.0 in | Wt 239.4 lb

## 2024-10-16 DIAGNOSIS — Z79899 Other long term (current) drug therapy: Secondary | ICD-10-CM | POA: Insufficient documentation

## 2024-10-16 DIAGNOSIS — I10 Essential (primary) hypertension: Secondary | ICD-10-CM | POA: Insufficient documentation

## 2024-10-16 DIAGNOSIS — G893 Neoplasm related pain (acute) (chronic): Secondary | ICD-10-CM | POA: Insufficient documentation

## 2024-10-16 DIAGNOSIS — Z7901 Long term (current) use of anticoagulants: Secondary | ICD-10-CM | POA: Insufficient documentation

## 2024-10-16 DIAGNOSIS — C12 Malignant neoplasm of pyriform sinus: Secondary | ICD-10-CM | POA: Insufficient documentation

## 2024-10-16 DIAGNOSIS — I480 Paroxysmal atrial fibrillation: Secondary | ICD-10-CM | POA: Insufficient documentation

## 2024-10-16 DIAGNOSIS — Z87891 Personal history of nicotine dependence: Secondary | ICD-10-CM | POA: Insufficient documentation

## 2024-10-16 DIAGNOSIS — G4700 Insomnia, unspecified: Secondary | ICD-10-CM | POA: Insufficient documentation

## 2024-10-16 DIAGNOSIS — Z8673 Personal history of transient ischemic attack (TIA), and cerebral infarction without residual deficits: Secondary | ICD-10-CM | POA: Insufficient documentation

## 2024-10-16 DIAGNOSIS — Z5111 Encounter for antineoplastic chemotherapy: Secondary | ICD-10-CM | POA: Insufficient documentation

## 2024-10-16 DIAGNOSIS — Z800 Family history of malignant neoplasm of digestive organs: Secondary | ICD-10-CM | POA: Insufficient documentation

## 2024-10-16 DIAGNOSIS — Z951 Presence of aortocoronary bypass graft: Secondary | ICD-10-CM | POA: Insufficient documentation

## 2024-10-16 DIAGNOSIS — I2510 Atherosclerotic heart disease of native coronary artery without angina pectoris: Secondary | ICD-10-CM | POA: Insufficient documentation

## 2024-10-16 DIAGNOSIS — R634 Abnormal weight loss: Secondary | ICD-10-CM | POA: Insufficient documentation

## 2024-10-16 DIAGNOSIS — Z79891 Long term (current) use of opiate analgesic: Secondary | ICD-10-CM | POA: Insufficient documentation

## 2024-10-16 DIAGNOSIS — Z808 Family history of malignant neoplasm of other organs or systems: Secondary | ICD-10-CM | POA: Insufficient documentation

## 2024-10-16 LAB — RAD ONC ARIA SESSION SUMMARY
Course Elapsed Days: 13
Plan Fractions Treated to Date: 9
Plan Prescribed Dose Per Fraction: 2 Gy
Plan Total Fractions Prescribed: 35
Plan Total Prescribed Dose: 70 Gy
Reference Point Dosage Given to Date: 18 Gy
Reference Point Session Dosage Given: 0.5734 Gy
Session Number: 9

## 2024-10-16 LAB — CBC WITH DIFFERENTIAL (CANCER CENTER ONLY)
Abs Immature Granulocytes: 0.07 K/uL (ref 0.00–0.07)
Basophils Absolute: 0 K/uL (ref 0.0–0.1)
Basophils Relative: 0 %
Eosinophils Absolute: 0.1 K/uL (ref 0.0–0.5)
Eosinophils Relative: 1 %
HCT: 35.1 % — ABNORMAL LOW (ref 39.0–52.0)
Hemoglobin: 12 g/dL — ABNORMAL LOW (ref 13.0–17.0)
Immature Granulocytes: 1 %
Lymphocytes Relative: 13 %
Lymphs Abs: 1.2 K/uL (ref 0.7–4.0)
MCH: 30.8 pg (ref 26.0–34.0)
MCHC: 34.2 g/dL (ref 30.0–36.0)
MCV: 90.2 fL (ref 80.0–100.0)
Monocytes Absolute: 1 K/uL (ref 0.1–1.0)
Monocytes Relative: 12 %
Neutro Abs: 6.6 K/uL (ref 1.7–7.7)
Neutrophils Relative %: 73 %
Platelet Count: 250 K/uL (ref 150–400)
RBC: 3.89 MIL/uL — ABNORMAL LOW (ref 4.22–5.81)
RDW: 13.1 % (ref 11.5–15.5)
WBC Count: 9.1 K/uL (ref 4.0–10.5)
nRBC: 0 % (ref 0.0–0.2)

## 2024-10-16 LAB — BASIC METABOLIC PANEL - CANCER CENTER ONLY
Anion gap: 5 (ref 5–15)
BUN: 20 mg/dL (ref 8–23)
CO2: 28 mmol/L (ref 22–32)
Calcium: 9 mg/dL (ref 8.9–10.3)
Chloride: 102 mmol/L (ref 98–111)
Creatinine: 0.93 mg/dL (ref 0.61–1.24)
GFR, Estimated: >60 mL/min (ref >60)
Glucose, Bld: 130 mg/dL — ABNORMAL HIGH (ref 70–99)
Potassium: 4.3 mmol/L (ref 3.5–5.1)
Sodium: 135 mmol/L (ref 135–145)

## 2024-10-16 LAB — MAGNESIUM: Magnesium: 2.3 mg/dL (ref 1.7–2.4)

## 2024-10-16 MED FILL — Fosaprepitant Dimeglumine For IV Infusion 150 MG (Base Eq): INTRAVENOUS | Qty: 5 | Status: AC

## 2024-10-16 NOTE — Therapy (Signed)
 OUTPATIENT SPEECH LANGUAGE PATHOLOGY ONCOLOGY EVALUATION   Patient Name: Dave Williams MRN: [id number] DOB:[birth year], 68 y.o., male Today's Date: 10/17/2024  PCP: Myra Frieze, MD REFERRING PROVIDER: Izell Domino, MD  END OF SESSION:  End of Session - 10/17/24 1420     Visit Number 1    Number of Visits 7    Date for Recertification  01/15/25    SLP Start Time 0820    SLP Stop Time  0852    SLP Time Calculation (min) 32 min    Activity Tolerance Patient tolerated treatment well          Past Medical History:  Diagnosis Date   Anxiety    Chronic left hip pain    Compression fracture of spine (HCC)    multiple compression Fx with disc bulge   Coronary artery disease    Hypertension    Pneumonia    As a teenager   Stroke [facility]) 2022   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   Past Surgical History:  Procedure Laterality Date   BACK SURGERY     Radiocartography T2-T12   CORONARY ARTERY BYPASS GRAFT N/A 03/04/2024   Procedure: CORONARY ARTERY BYPASS GRAFTING (CABG) TIMES THREE UTILIZING LEFT INTERNAL MAMMARY ARTERY AND ENDOSCOPIC VEIN HARVEST RIGHT GREATER SAPHENOUS VEIN;  Surgeon: Shyrl Linnie KIDD, MD;  Location: [facility] OR;  Service: Open Heart Surgery;  Laterality: N/A;   DIRECT LARYNGOSCOPY N/A 07/24/2024   Procedure: LARYNGOSCOPY, DIRECT;  Surgeon: Tobie Eldora NOVAK, MD;  Location: [facility] OR;  Service: ENT;  Laterality: N/A;  Direct Laryngoscopy with biopsy with use of operating telescope   IR IMAGING GUIDED PORT INSERTION  09/25/2024   KNEE ARTHROSCOPY Bilateral    LEFT HEART CATH AND CORONARY ANGIOGRAPHY N/A 11/28/2023   Procedure: LEFT HEART CATH AND CORONARY ANGIOGRAPHY;  Surgeon: Jordan, Peter M, MD;  Location: [facility] INVASIVE CV LAB;  Service: Cardiovascular;  Laterality: N/A;   PILONIDAL CYST EXCISION     In patients 20s   RIGID ESOPHAGOSCOPY N/A 07/24/2024   Procedure: ESOPHAGOSCOPY, RIGID;  Surgeon: Tobie Eldora NOVAK, MD;  Location: [facility] OR;  Service: ENT;  Laterality: N/A;   TEE WITHOUT CARDIOVERSION N/A 03/04/2024   Procedure: ECHOCARDIOGRAM, TRANSESOPHAGEAL;  Surgeon: Shyrl Linnie KIDD, MD;  Location: [facility] OR;  Service: Open Heart Surgery;  Laterality: N/A;   Patient Active Problem List   Diagnosis Date Noted   Cancer associated pain 08/08/2024   Cancer of pyriform sinus (HCC) 08/07/2024   Mass of hypopharynx 07/24/2024   Positive colorectal cancer screening using Cologuard test 06/10/2024   PAF (paroxysmal atrial fibrillation) (HCC) 04/22/2024   Acute blood loss anemia 03/21/2024   Leucocytosis 03/21/2024   Urinary retention with incomplete bladder emptying 03/21/2024   Debility 03/13/2024   S/P CABG x 3 03/04/2024   CAD (coronary artery disease) 12/22/2023   Chest pain of uncertain etiology 11/27/2023   Cerebrovascular accident (HCC) 07/05/2023   History of hypertension 07/05/2023   Hypercholesterolemia 07/05/2023    ONSET DATE: See pertinent history below   REFERRING DIAG:  C13.9 (ICD-10-CM) - Squamous cell cancer of hypopharynx (HCC)    THERAPY DIAG:  Dysphagia, unspecified type  Rationale for Evaluation and Treatment: Rehabilitation  SUBJECTIVE:   SUBJECTIVE STATEMENT: PT denies no overt s/sx dysphagia.  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 He presented  to his PCP with complaints of a recurrent right neck mass. 06/26/24 He saw Dr. Tobie. He underwent a transnasal fiberoptic laryngoscopy indicating a hypopharynx mass involving the pyriform and likely arytenoid submucosally concerning for malignancy. 07/24/24 He underwent a right neck mass biopsy revealing Invasive SCC. 07/31/24 CT neck showed a 2 cm supraglottic mass to the right of midline consistent with a malignancy along with a bulky malignant lymphadenopathy on the right at level 2 and level 3, with indistinct margins  consistent with extra capsular spread. The upper level 2 node shows a diameter of 2.2 x 2.8 cm. The lower level 2 node measures approximately 28 x 17 mm. level 3 lymph node similarly with indistinct margins shows diameter of 21 x 19 mm. He followed up with Dr. Tobie and expressed no interest in surgical interventions. Dr. Izell 08/06/24 and Dr. Autumn 08/07/24. He will receive chemotherapy with radiation. Treatment plan: He will receive 35 fractions of radiation with weekly chemotherapy to his Laryngopharynx and bilateral neck which started on 10/03/24 and complete 11/25/24. Pretreatment procedures: 09/18/24 Extraction of all max teeth and posterior mand teeth and debridement performed. 09/25/24 PAC. He has declined PEG against medical advice.   PAIN:  Are you having pain? Yes: NPRS scale: 4/10 Pain location: throat Pain description: sore Aggravating factors: dryness Relieving factors: meds  FALLS: Has patient fallen in last 6 months?  See PT evaluation for details  LIVING ENVIRONMENT: Lives with: lives alone Lives in: House/apartment  PLOF:  Level of assistance: Independent with ADLs, Independent with IADLs Employment: On disability  PATIENT GOALS: Maintain WNL swallowing  OBJECTIVE:  Note: Objective measures were completed at Evaluation unless otherwise noted.   INSTRUMENTAL SWALLOW STUDY FINDINGS (MBSS) n/a  COGNITION: Overall cognitive status: Within functional limits for tasks assessed  LANGUAGE: Receptive and Expressive language appeared WNL.  ORAL MOTOR EXAMINATION: Overall status: WFL  MOTOR SPEECH: Overall motor speech: Appears intact Respiration: thoracic breathing Phonation: normal Resonance: WFL Articulation: Appears intact  SUBJECTIVE DYSPHAGIA REPORTS:  Date of onset: n/a Reported symptoms: n/a  Current diet: Dysphagia 3 (mechanical soft) and thin liquids  Co-morbid voice changes: No  FACTORS WHICH MAY INCREASE RISK OF ADVERSE EVENT IN PRESENCE OF  ASPIRATION:  General health: well appearing  Risk factors: none evident   CLINICAL SWALLOW ASSESSMENT:   Dentition: missing dentition due to extractions Vocal quality at baseline: normal Patient directly observed with POs: Yes: dysphagia 3 (soft) and thin liquids  Feeding: able to feed self Liquids provided by: cup Oral phase signs and symptoms: none noted today Pharyngeal phase signs and symptoms: none noted today                                                                                                                            TREATMENT DATE:   10/17/24: Research states the risk for dysphagia increases due to radiation and/or chemotherapy treatment due to a variety of factors, so SLP educated the pt about the possibility  of reduced/limited ability for PO intake during rad tx. SLP also educated pt regarding possible changes to swallowing musculature after rad tx, and why adherence to dysphagia HEP provided today and PO consumption was necessary to inhibit muscle fibrosis following rad tx and to mitigate muscle disuse atrophy. SLP informed pt why this would be detrimental to their swallowing status and to their pulmonary health. Pt demonstrated understanding of these things to SLP. SLP encouraged pt to safely eat and drink as deep into their radiation/chemotherapy as possible to provide the best possible long-term swallowing outcome for pt.  SLP then developed an individualized HEP for pt involving oral and pharyngeal strengthening and ROM and pt was instructed how to perform these exercises, including SLP demonstration. After SLP demonstration, pt return demonstrated each exercise. SLP ensured pt performance was correct prior to educating pt on next exercise. Pt required usual min cues faded to modified independent to perform HEP. Pt was instructed to complete this program 5-7 days/week, at least 20 reps until 6 months after his last day of rad tx, and then x2 a week after that, indefinitely.  Among other modifications for days when pt cannot functionally swallow, SLP also suggested pt to perform only non-swallowing tasks on the handout/HEP, and if necessary to cycle through the swallowing portion so the full program of exercises can be completed instead of fatiguing on one of the swallowing exercises and being unable to perform the other swallowing exercises. SLP instructed that swallowing exercises should then be added back into the regimen as pt is able to do so. Secondly, pt was told that former patients have told SLP that during their course of radiation therapy, taking prescribed pain medication just prior to performing HEP (and eating/drinking) has proven helpful in completing HEP (and eating and drinking) more regularly when going through their course of radiation treatment.    PATIENT EDUCATION: Education details: late effects head/neck radiation on swallow function, HEP procedure, and modification to HEP when difficulty experienced with swallowing during and after radiation course Person educated: Patient Education method: Explanation, Demonstration, Verbal cues, and Handouts Education comprehension: verbalized understanding, returned demonstration, verbal cues required, and needs further education   ASSESSMENT:  CLINICAL IMPRESSION: Patient is a 68 y.o. M who was seen today for assessment of swallowing as they undergo radiation/chemoradiation therapy. Today pt ate ate crackers/chips and drank thin liquids without overt s/s oral or pharyngeal difficulty. At this time pt swallowing is deemed WNL/WFL with these POs. No oral or overt s/sx pharyngeal deficits, including aspiration were observed. There are no overt s/s aspiration PNA observed by SLP nor any reported by pt at this time. Data indicate that pt's swallow ability will likely decrease over the course of radiation/chemoradiation therapy and could very well decline over time following the conclusion of that therapy due to muscle  disuse atrophy and/or muscle fibrosis. Pt will cont to need to be seen by SLP in order to assess safety of PO intake, assess the need for recommending any objective swallow assessment, and ensuring pt is correctly completing the individualized HEP.   OBJECTIVE IMPAIRMENTS: include dysphagia. These impairments are limiting patient from safety when swallowing. Factors affecting potential to achieve goals and functional outcome are none noted today. Patient will benefit from skilled SLP services to address above impairments and improve overall function.   REHAB POTENTIAL: Good     GOALS: Goals reviewed with patient? No   SHORT TERM GOALS: Target: 3rd total session   1. Pt will complete HEP with  modified independence in 2 sessions Baseline: Goal status: INITIAL   2.  pt will tell SLP why pt is completing HEP with modified independence Baseline:  Goal status: INITIAL   3.  pt will describe 3 overt s/s aspiration PNA with modified independence Baseline:  Goal status: INITIAL   4.  pt will tell SLP how a food journal could hasten return to a more normalized diet Baseline:  Goal status: INITIAL     LONG TERM GOALS: Target: 7th total session   1.  pt will complete HEP with independence over two visits Baseline:  Goal status: INITIAL   2.  pt will describe how to modify HEP over time, and the timeline associated with reduction in HEP frequency with modified independence over two sessions Baseline:  Goal status: INITIAL     PLAN:   SLP FREQUENCY:  once approx every 4 weeks   SLP DURATION:  7 sessions   PLANNED INTERVENTIONS: Aspiration precaution training, Pharyngeal strengthening exercises, Diet toleration management , Trials of upgraded texture/liquids, SLP instruction and feedback, Compensatory strategies, and Patient/family education, [phone] (treatment of swallowing dysfunction and/or oral function for feeding)  Abanoub Hanken, CCC-SLP 10/17/2024, 2:20 PM

## 2024-10-16 NOTE — Progress Notes (Signed)
 [name]  ONCOLOGY CLINIC PROGRESS NOTE   Patient Care Team: Myra Geni ORN, FNP as PCP - General (Family Medicine) Court Dorn PARAS, MD as PCP - Cardiology (Cardiology) Malmfelt, Delon CROME, RN as Oncology Nurse Navigator Izell Domino, MD as Consulting Physician (Radiation Oncology) Autumn Millman, MD as Consulting Physician (Oncology) Tobie Eldora NOVAK, MD as Consulting Physician (Otolaryngology)  PATIENT NAME: Dave Williams   MR#: [id number] DOB: [birth year]  Date of visit: 10/16/2024   ASSESSMENT & PLAN:   Dave Williams is a 68 y.o. 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   Cancer of pyriform sinus (HCC) Please review oncology history for additional details and timeline of events.  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  Patient declined surgical option.  Hence plan made to proceed with concurrent chemoradiation.  Renal function is fluctuating and this needs to be monitored closely.  The cancer has a high risk of recurrence, necessitating aggressive treatment.  We have discussed about role of cisplatin being a radiosensitizer in the treatment of head and neck cancer.  We have discussed about the curative intent of chemoradiation for this patient.     We have discussed about mechanism of action of cisplatin, adverse effects of cisplatin including but not limited to fatigue, nausea, vomiting, increased risk of infections, mucositis, ototoxicity, nephrotoxicity, peripheral neuropathy.  Patient understands that some of the side effects can be permanent and even potentially fatal. Patient is willing to proceed with weekly  cisplatin.  - Plan is to use cisplatin as the preferred chemotherapy agent, contingent on adequate kidney function due to nephrotoxicity.  Other options include weekly docetaxel or weekly carboplatin and paclitaxel, if kidney function does not permit cisplatin use.  - Patient declined feeding tube placement at this time.  - Encourage hydration to maintain kidney function, especially if not using a feeding tube.  Appropriate referrals were previously made for supportive care including physical therapy, speech pathology, nutrition.  Labs today reveal no dose-limiting toxicities.  Creatinine is better at 0.93.  We will proceed with cycle 3 of cisplatin at 40 mg/m dose tomorrow, 10/17/2024 and plan to continue treatments once a week.  RTC in 1 week for labs, follow-up and continuation of chemotherapy.  Cancer associated pain Experiencing intermittent pain managed with hydrocodone  7.5 mg and acetaminophen . Pain is variable, sometimes more like a headache. Prefers to avoid stronger medications to prevent dependency. Using baking soda and salt mouthwash for oral discomfort. - Continue hydrocodone  7.5 mg and acetaminophen  as needed for pain. Considering switching to oxycodone  to avoid acetaminophen -related liver issues, if he needs higher doses. - Use baking soda and salt mouthwash for oral discomfort. - Monitor pain levels and contact provider if pain becomes unmanageable.  I reviewed lab results and outside records for this visit and discussed relevant results with the patient. Diagnosis, plan of care and treatment options were also discussed in detail with the patient. Opportunity provided to ask questions and answers provided to his apparent satisfaction. Provided instructions to call our clinic with any problems, questions or concerns prior to return visit. I recommended to continue follow-up with PCP and sub-specialists. He verbalized understanding and agreed with the plan.   NCCN guidelines  have  been consulted in the planning of this patient's care.  I spent a total of 40 minutes during this encounter with the patient including review of chart and various tests results, discussions about plan of care and coordination of care plan.   Chinita Patten, MD  10/16/2024 10:45 AM  [facility] - A DEPT OF [facility] [address] [place] [address] Dept: [phone] Dept Fax: [phone]    CHIEF COMPLAINT/ REASON FOR VISIT:   Squamous cell carcinoma of the pyriform sinus, cT2, cN3, cM0, stage IV B.   Current Treatment:  Concurrent chemoradiation with weekly cisplatin started from 10/03/2024  INTERVAL HISTORY:    Discussed the use of AI scribe software for clinical note transcription with the patient, who gave verbal consent to proceed.  History of Present Illness Dave Williams is a 68 year old male undergoing chemotherapy who presents with concerns about treatment scheduling and medication side effects.  He experiences severe side effects from steroid use, including intense rage, elevated blood pressure, and a pulse rate of 130-140 bpm. He wishes to avoid steroids in the future due to these adverse reactions.  He is currently undergoing chemotherapy and is concerned about the lack of advance scheduling for his treatments, which complicates arranging transportation. He requires at least two weeks' notice to organize rides, as he prefers not to drive himself to chemotherapy sessions due to potential reactions.  He takes hydrocodone  for pain management approximately every four to four and a half hours. He uses two 5 mg doses instead of a single 7.5 mg dose, as the latter was not effective. He is cautious about the acetaminophen  content due to potential liver effects, although his liver function tests are currently normal.  He continues to use [employer] as part of his nutritional  intake. He is concerned about his immune system becoming compromised and is considering wearing a mask to prevent infections, especially with the rise of COVID-19 cases.  He is vigilant about avoiding situations that could compromise his immune system, such as exposure to COVID-19.  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 No nausea, vomiting, fatigue, and mouth sores. He reports soreness from radiation but notes improvement in swelling.   I have reviewed the past medical history, past surgical history, social history and family history with the patient and they are unchanged from previous note.  HISTORY OF PRESENT ILLNESS:   ONCOLOGY HISTORY:   He presented to his PCP with complains of a recurrent right neck mass.   Patient had US  neck on 06/05/24 showing enlarged lymph nodes in right superior cervical chain measuring 5.1 x 2.1 x 3.0 cm and 2.5 x 1.4 x 1.6 cm. CT soft tissue neck was performed on 06/16/24 showed a 2 cm supraglottic mass to the right of midline consistent with a malignancy and bulky malignant lymphadenopathy on the right at level 2 and level 3, with indistinct margins consistent with extra capsular spread. The upper level 2 node shows a diameter of 2.2 x 2.8 cm. The lower level 2 node measures approximately 28 x 17 mm. Level 3 lymph node similarly with indistinct margins shows diameter of 21 x 19 mm.    His PCP referred him to our clinic for further evaluation of neck lymphadenopathy.  He was seen in our rapid diagnostic clinic on 06/19/2024.  CBCD was unremarkable.  Flow cytometry of peripheral blood  was also unremarkable.  ESR, CRP, LDH were within normal limits.  HIV, hepatitis panel testing was negative.   To further investigate his symptoms, he was referred to Dr. Tobie on 06/26/24. At that time, patient recalled noticing a right neck mass last year that eventually resolved on its own. However, in January 2025, mass had  reoccurred and had increased in size, causing  discomfort in the area.    He then underwent a Transnasal Fiberoptic Laryngoscopy indicating a hypopharynx neck mass involving the pyriform and likely arytenoid submucosally concerning for malignancy. Vocal folds were mobile bilaterally. No lesions on the free edge of the vocal folds. No airway compromise concern   Subsequently, he underwent a right neck mass biopsy on 07/24/24 which revealed invasive squamous cell carcinoma.         He presented for a follow up with Dr. Tobie on 07/31/24 to discuss further treatment options. Patient expressed no interest in surgical interventions. He would like to consider chemoradiation.    PET scan performed on 08/01/24 showed the right eccentric supraglottic mass has a maximum SUV of 6.9 along with hypermetabolic activity in all identified lymph nodes. Scan also noted several scattered pulmonary nodules with the largest measuring 0.4 x 0.5 cm with surveillance recommended as nodules are small in size to detect hypermetabolic activity.     Family history includes his father having esophageal cancer that metastasized to the spine, his mother having skin and anal cancer, and his sister having thyroid  cancer.    He had consultation with Dr. Izell on 08/06/2024.  Plan made for 7 weeks of radiation therapy and he was referred to us  for concurrent chemotherapy.   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  Patient declined surgical option.  Hence plan made to proceed with concurrent chemoradiation using weekly cisplatin.  Renal function is fluctuating and this needs to be monitored closely.   Further chart review shows patient established care with GI on 06/10/24 after having a positive cologuard test. It was recommended patient have colonoscopy however he prefers to hold off while undergoing oncology work up. GI will reach out to patient in 2 months to schedule.   Started  concurrent chemoradiation with weekly cisplatin from 10/03/2024.  Oncology History  Cancer of pyriform sinus (HCC)  08/07/2024 Initial Diagnosis   Cancer of pyriform sinus (HCC)   08/07/2024 Cancer Staging   Staging form: Pharynx - Hypopharynx, AJCC 8th Edition - Clinical stage from 08/07/2024: Stage IVB (cT2, cN3, cM0) - Signed by Izell Domino, MD on 08/07/2024 Stage prefix: Initial diagnosis   10/03/2024 -  Chemotherapy   Patient is on Treatment Plan : HEAD/NECK Cisplatin (40) q7d         REVIEW OF SYSTEMS:   Review of Systems - Oncology  All other pertinent systems were reviewed with the patient and are negative.  ALLERGIES: He is allergic to prednisone .  MEDICATIONS:  Current Outpatient Medications  Medication Sig Dispense Refill   acetaminophen  (TYLENOL ) 500 MG tablet Take 1,000 mg by mouth every 6 (six) hours as needed (pain.).     amLODipine  (NORVASC ) 10 MG tablet Take 1 tablet (10 mg total) by mouth daily. 90 tablet 2   aspirin  EC 81 MG tablet Take 1 tablet (81 mg total) by mouth daily. Swallow whole.     atorvastatin  (LIPITOR ) 80 MG tablet Take 1 tablet (80 mg total) by mouth daily with supper. 90 tablet 3   HYDROcodone -acetaminophen  (NORCO) 7.5-325 MG tablet  Take 1 tablet by mouth every 6 (six) hours as needed for severe pain (pain score 7-10). 60 tablet 0   isosorbide  mononitrate (IMDUR ) 30 MG 24 hr tablet Take 1 tablet (30 mg total) by mouth daily. 90 tablet 1   lidocaine  (XYLOCAINE ) 2 % solution Patient: Mix 1part 2% viscous lidocaine , 1part H20. Swish & swallow 10mL of diluted mixture, 30min before meals and at bedtime, up to QID 200 mL 3   lidocaine -prilocaine  (EMLA ) cream Apply to affected area once 30 g 3   nitroGLYCERIN  (NITROSTAT ) 0.4 MG SL tablet Place 1 tablet (0.4 mg total) under the tongue every 5 (five) minutes as needed for chest pain. DISSOLVE 1 TABLET UNDER THE TONGUE EVERY 5 MINUTES AS NEEDED FOR CHEST PAIN. DO NOT EXCEED A TOTAL OF 3 DOSES IN 15 MINUTES.  25 tablet 6   ondansetron  (ZOFRAN ) 8 MG tablet Take 1 tablet (8 mg total) by mouth every 8 (eight) hours as needed for nausea or vomiting. Start on the third day after cisplatin. 30 tablet 1   pantoprazole  (PROTONIX ) 40 MG tablet Take 1 tablet (40 mg total) by mouth daily. 30 tablet 0   prochlorperazine  (COMPAZINE ) 10 MG tablet Take 1 tablet (10 mg total) by mouth every 6 (six) hours as needed (Nausea or vomiting). 30 tablet 1   vitamin D3 (CHOLECALCIFEROL ) 25 MCG tablet Take 1 tablet (1,000 Units total) by mouth daily. 30 tablet 0   No current facility-administered medications for this visit.     VITALS:   Blood pressure 115/65, pulse 82, temperature 98.4 F (36.9 C), temperature source Temporal, resp. rate 17, height 6' 1 (1.854 m), weight 239 lb 6.4 oz (108.6 kg), SpO2 96%.  Wt Readings from Last 3 Encounters:  10/16/24 239 lb 6.4 oz (108.6 kg)  10/10/24 238 lb (108 kg)  10/09/24 238 lb 12.8 oz (108.3 kg)    Body mass index is 31.59 kg/m.    Onc Performance Status - 10/16/24 0943       ECOG Perf Status   ECOG Perf Status Fully active, able to carry on all pre-disease performance without restriction      KPS SCALE   KPS % SCORE Normal, no compliants, no evidence of disease           PHYSICAL EXAM:   Physical Exam Constitutional:      General: He is not in acute distress.    Appearance: Normal appearance.  HENT:     Head: Normocephalic and atraumatic.  Eyes:     Conjunctiva/sclera: Conjunctivae normal.  Cardiovascular:     Rate and Rhythm: Normal rate and regular rhythm.  Pulmonary:     Effort: Pulmonary effort is normal. No respiratory distress.  Chest:     Comments: Port-A-Cath in place without signs of infection. Abdominal:     General: There is no distension.  Lymphadenopathy:     Cervical: Cervical adenopathy (Right Level 2/3 mass, fixed, ~ 5cm, less firm compared to prior) present.  Neurological:     General: No focal deficit present.     Mental  Status: He is alert and oriented to person, place, and time.  Psychiatric:        Mood and Affect: Mood normal.        Behavior: Behavior normal.      LABORATORY DATA:   I have reviewed the data as listed.  Results for orders placed or performed in visit on 10/16/24  Rad Onc Aria Session Summary  Result Value Ref Range  Course ID [id number]    Course Start Date 09/23/2024    Session Number 9    Course First Treatment Date 10/03/2024  3:00 PM    Course Last Treatment Date 10/16/2024 10:40 AM    Course Elapsed Days 13    Reference Point ID HN DP    Reference Point Dosage Given to Date 82.57341252 Gy   Reference Point Session Dosage Given 8.57341260 Gy   Plan ID [id number]    Plan Fractions Treated to Date 9    Plan Total Fractions Prescribed 35    Plan Prescribed Dose Per Fraction 2 Gy   Plan Total Prescribed Dose 70.000000 Gy   Plan Primary Reference Point HN DP   Results for orders placed or performed in visit on 10/16/24  Magnesium   Result Value Ref Range   Magnesium  2.3 1.7 - 2.4 mg/dL  Basic Metabolic Panel - Cancer Center Only  Result Value Ref Range   Sodium 135 135 - 145 mmol/L   Potassium 4.3 3.5 - 5.1 mmol/L   Chloride 102 98 - 111 mmol/L   CO2 28 22 - 32 mmol/L   Glucose, Bld 130 (H) 70 - 99 mg/dL   BUN 20 8 - 23 mg/dL   Creatinine 9.06 9.38 - 1.24 mg/dL   Calcium  9.0 8.9 - 10.3 mg/dL   GFR, Estimated >39 >39 mL/min   Anion gap 5 5 - 15  CBC with Differential (Cancer Center Only)  Result Value Ref Range   WBC Count 9.1 4.0 - 10.5 K/uL   RBC 3.89 (L) 4.22 - 5.81 MIL/uL   Hemoglobin 12.0 (L) 13.0 - 17.0 g/dL   HCT 64.8 (L) 60.9 - 47.9 %   MCV 90.2 80.0 - 100.0 fL   MCH 30.8 26.0 - 34.0 pg   MCHC 34.2 30.0 - 36.0 g/dL   RDW 86.8 88.4 - 84.4 %   Platelet Count 250 150 - 400 K/uL   nRBC 0.0 0.0 - 0.2 %   Neutrophils Relative % 73 %   Neutro Abs 6.6 1.7 - 7.7 K/uL   Lymphocytes Relative 13 %   Lymphs Abs 1.2 0.7 - 4.0 K/uL   Monocytes Relative 12 %    Monocytes Absolute 1.0 0.1 - 1.0 K/uL   Eosinophils Relative 1 %   Eosinophils Absolute 0.1 0.0 - 0.5 K/uL   Basophils Relative 0 %   Basophils Absolute 0.0 0.0 - 0.1 K/uL   Immature Granulocytes 1 %   Abs Immature Granulocytes 0.07 0.00 - 0.07 K/uL      RADIOGRAPHIC STUDIES:  I have personally reviewed the radiological images as listed and agree with the findings in the report.  IR IMAGING GUIDED PORT INSERTION Result Date: 09/25/2024 CLINICAL DATA:  Squamous cell cancer of the hypopharynx. Chest port placement for therapy. EXAM: Chest port catheter placement TECHNIQUE: Procedure performed using fluoroscopy and ultrasound CONTRAST:  None RADIOPHARMACEUTICALS:  None FLUOROSCOPY: 1 mGy COMPARISON:  None FINDINGS: The patient was placed in supine position on the IR gantry and the right upper chest and neck were prepped and draped in the usual sterile fashion. The nurse administered intravenous fentanyl  and Versed  under my supervision and the nurse had no other injuries other than monitoring the patient and administering medications. I was present for the entire duration of procedure. 175 mcg intravenous fentanyl  and 3.5 mg intravenous Versed  were administered for a total sedation time of 40 minutes. Ultrasound guidance was used to investigate the right internal jugular vein which was anechoic and compressible  indicating patency. The needle was then advanced from a scan negative through the soft tissue into the right internal jugular vein under ultrasound guidance. A final image was obtained and stored in the patient's permanent medical record. Access was then exchanged over a guidewire which was advanced under fluoroscopic guidance. The needle was removed and replaced with a micropuncture sheath. Approximately 2 inches below the clavicle the port pocket was created with a subsequent incision. The catheter was then tunneled from the port pocket to the venotomy site overlying the right internal jugular  vein. Access was then exchanged over an 035 guidewire for peel-away sheath which was advanced over the guidewire under fluoroscopic guidance. The catheter was then advanced through the peel-away sheath to the sinoatrial junction. Sheath was removed. The catheter was then cut at the port pocket and connected to chest port. The chest port was tested for function and finally function well. The chest port was then flushed with heparin  and a port pocket was closed with 4-0 suture. Final image was obtained demonstrating satisfactory position of chest port. The final count of all materials was satisfactory. IMPRESSION: 1. Satisfactory placement of right internal jugular vein single-lumen chest port. The catheter tip is at the cavoatrial junction. 2.  Okay to use and power inject chest port. Electronically Signed   By: Cordella Banner   On: 09/25/2024 15:39     CODE STATUS:  Code Status History     Date Active Date Inactive Code Status Order ID Comments User Context   09/25/2024 1523 09/26/2024 0524 Full Code [id number]  Banner Cordella LABOR, MD HOV   03/13/2024 1550 03/21/2024 1532 Full Code [id number]  Maurice Sharlet GORMAN DEVONNA Inpatient   03/04/2024 1247 03/13/2024 1548 Full Code [id number]  Viviane Lemond BRAVO, PA-C Inpatient   11/28/2023 1625 11/28/2023 2356 Full Code [id number]  Jordan, Peter M, MD Inpatient    Questions for Most Recent Historical Code Status (Order [id number])     Question Answer   By: Consent: discussion documented in EHR            No orders of the defined types were placed in this encounter.    Future Appointments  Date Time Provider Department Center  10/17/2024  7:30 AM [facility] INF[facility]ION [facility] None  10/17/2024  8:15 AM Jacelyn Lupita NOVAK, CCC-SLP [facility]  10/17/2024  9:00 AM Lanis Carbon, Blaire L, PT [facility] None  10/17/2024  9:00 AM Ivonne Harlene GORMAN, RD [facility] None  10/17/2024  2:15 PM [facility] [id number] [facility] None  10/18/2024 10:15 AM [facility] LINAC 4  [facility] None  10/21/2024 10:15 AM [facility] LINAC 4 [facility] None  10/21/2024 10:30 AM [facility]-SQUIRE [facility] None  10/22/2024 10:15 AM [facility] LINAC 4 [facility] None  10/23/2024 10:15 AM [facility] LINAC 4 [facility] None  10/24/2024 10:15 AM [facility] LINAC 4 [facility] None  10/25/2024 10:15 AM [facility] LINAC 4 [facility] None  10/28/2024 10:15 AM [facility] LINAC 4 [facility] None  10/29/2024 10:15 AM [facility] LINAC 4 [facility] None  10/30/2024 10:15 AM [facility] LINAC 4 [facility] None  10/31/2024 10:15 AM [facility] LINAC 4 [facility] None  11/01/2024 10:15 AM [facility] LINAC 4 [facility] None  11/03/2024 10:15 AM [facility] LINAC 4 [facility] None  11/04/2024 10:15 AM [facility] LINAC 4 [facility] None  11/05/2024 10:15 AM [facility] LINAC 4 [facility] None  11/06/2024 10:15 AM [facility] LINAC 4 [facility] None  11/11/2024 10:15 AM [facility] LINAC 4 [facility] None  11/12/2024 10:15 AM [facility] LINAC 4 [facility] None  11/13/2024 10:15 AM [facility] LINAC 4 [facility] None  11/14/2024 10:15 AM [facility] LINAC 4 [facility] None  11/15/2024 10:15 AM [facility] LINAC 4 [facility] None  11/18/2024 10:15 AM [facility] LINAC 4 [facility] None  11/19/2024 10:15 AM [facility] LINAC 4 [facility] None  11/20/2024 10:15 AM [facility] LINAC 4 [facility] None  11/21/2024 10:15 AM [facility] LINAC 4 [facility] None  11/25/2024 10:15 AM [facility] LINAC 4 [facility] None  01/22/2025 10:30 AM Fountain, Lum CROME, NP [facility]     This document was completed utilizing speech recognition software. Grammatical errors, random word insertions, pronoun errors, and incomplete sentences are an occasional consequence of this system due to software limitations, ambient noise, and hardware issues. Any formal questions or concerns about the content, text or information contained within the body of this dictation should be directly  addressed to the provider for clarification.

## 2024-10-16 NOTE — Assessment & Plan Note (Addendum)
 Please review oncology history for additional details and timeline of events.  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  Patient declined surgical option.  Hence plan made to proceed with concurrent chemoradiation.  Renal function is fluctuating and this needs to be monitored closely.  The cancer has a high risk of recurrence, necessitating aggressive treatment.  We have discussed about role of cisplatin being a radiosensitizer in the treatment of head and neck cancer.  We have discussed about the curative intent of chemoradiation for this patient.     We have discussed about mechanism of action of cisplatin, adverse effects of cisplatin including but not limited to fatigue, nausea, vomiting, increased risk of infections, mucositis, ototoxicity, nephrotoxicity, peripheral neuropathy.  Patient understands that some of the side effects can be permanent and even potentially fatal. Patient is willing to proceed with weekly cisplatin.  - Plan is to use cisplatin as the preferred chemotherapy agent, contingent on adequate kidney function due to nephrotoxicity.  Other options include weekly docetaxel or weekly carboplatin and paclitaxel, if kidney function does not permit cisplatin use.  - Patient declined feeding tube placement at this time.  - Encourage hydration to maintain kidney function, especially if not using a feeding tube.  Appropriate referrals were previously made for supportive care including physical therapy, speech pathology, nutrition.  Labs today reveal no dose-limiting toxicities.  Creatinine is better at 0.93.  We will proceed with cycle 3 of cisplatin at 40 mg/m dose tomorrow, 10/17/2024 and plan to continue treatments once a week.  RTC in 1 week for labs, follow-up and continuation of chemotherapy.

## 2024-10-16 NOTE — Progress Notes (Signed)
 Cardiac Individual Treatment Plan  Patient Details  Name: Dave Williams MRN: [id number] Date of Birth: 05-Jan-1956 Referring Provider:   Flowsheet Row CARDIAC REHAB PHASE II ORIENTATION from 05/20/2024 in [facility] CARDIAC REH[facility]ILITATION  Referring Provider Court Carrier MD    Initial Encounter Date:  Flowsheet Row CARDIAC REHAB PHASE II ORIENTATION from 05/20/2024 in [place] [facility]  Date 05/20/24    Visit Diagnosis: S/P CABG x 3  Patient's Home Medications on Admission:  Current Outpatient Medications:    acetaminophen  (TYLENOL ) 500 MG tablet, Take 1,000 mg by mouth every 6 (six) hours as needed (pain.)., Disp: , Rfl:    amLODipine  (NORVASC ) 10 MG tablet, Take 1 tablet (10 mg total) by mouth daily., Disp: 90 tablet, Rfl: 2   aspirin  EC 81 MG tablet, Take 1 tablet (81 mg total) by mouth daily. Swallow whole., Disp: , Rfl:    atorvastatin  (LIPITOR ) 80 MG tablet, Take 1 tablet (80 mg total) by mouth daily with supper., Disp: 90 tablet, Rfl: 3   dexamethasone  (DECADRON ) 4 MG tablet, Take 2 tablets (8 mg) by mouth daily x 3 days starting the day after cisplatin chemotherapy. Take with food., Disp: 30 tablet, Rfl: 1   HYDROcodone -acetaminophen  (NORCO) 7.5-325 MG tablet, Take 1 tablet by mouth every 6 (six) hours as needed for moderate pain (pain score 4-6)., Disp: 60 tablet, Rfl: 0   isosorbide  mononitrate (IMDUR ) 30 MG 24 hr tablet, Take 1 tablet (30 mg total) by mouth daily., Disp: 90 tablet, Rfl: 1   lidocaine  (XYLOCAINE ) 2 % solution, Patient: Mix 1part 2% viscous lidocaine , 1part H20. Swish & swallow 10mL of diluted mixture, before meals and at bedtime, up to QID, Disp: 200 mL, Rfl: 3   lidocaine -prilocaine  (EMLA ) cream, Apply to affected area once, Disp: 30 g, Rfl: 3   nitroGLYCERIN  (NITROSTAT ) 0.4 MG SL tablet, Place 1 tablet (0.4 mg total) under the tongue every 5 (five) minutes as needed for chest pain. DISSOLVE 1 TABLET UNDER THE TONGUE EVERY 5 MINUTES AS  NEEDED FOR CHEST PAIN. DO NOT EXCEED A TOTAL OF 3 DOSES IN 15 MINUTES., Disp: 25 tablet, Rfl: 6   ondansetron  (ZOFRAN ) 8 MG tablet, Take 1 tablet (8 mg total) by mouth every 8 (eight) hours as needed for nausea or vomiting. Start on the third day after cisplatin., Disp: 30 tablet, Rfl: 1   pantoprazole  (PROTONIX ) 40 MG tablet, Take 1 tablet (40 mg total) by mouth daily., Disp: 30 tablet, Rfl: 0   prochlorperazine  (COMPAZINE ) 10 MG tablet, Take 1 tablet (10 mg total) by mouth every 6 (six) hours as needed (Nausea or vomiting)., Disp: 30 tablet, Rfl: 1   vitamin D3 (CHOLECALCIFEROL ) 25 MCG tablet, Take 1 tablet (1,000 Units total) by mouth daily., Disp: 30 tablet, Rfl: 0  Past Medical History: Past Medical History:  Diagnosis Date   Anxiety    Chronic left hip pain    Compression fracture of spine (HCC)    multiple compression Fx with disc bulge   Coronary artery disease    Hypertension    Pneumonia    As a teenager   Stroke [facility]) 2022   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    Tobacco Use: Social History   Tobacco Use  Smoking Status Former   Current packs/day: 0.50   Types: Cigarettes  Smokeless Tobacco Never    Labs: Review Flowsheet  Latest Ref Rng & Units 02/29/2024 03/04/2024 03/06/2024  Labs for ITP Cardiac and Pulmonary Rehab  Cholestrol 0 - 200 mg/dL - - 94   LDL (calc) 0 - 99 mg/dL - - 50   HDL-C >59 mg/dL - - 22   Trlycerides <849 mg/dL - - 887   Hemoglobin J8r 4.8 - 5.6 % 5.8  - -  PH, Arterial 7.35 - 7.45 - 7.324  7.306  7.340  7.280  7.271  7.290  7.314  -  PCO2 arterial 32 - 48 mmHg - 41.3  40.1  43.4  32.8  49.2  48.8  51.8  -  Bicarbonate 20.0 - 28.0 mmol/L - 21.1  19.7  23.1  15.4  22.8  23.5  25.5  26.4  -  TCO2 22 - 32 mmol/L - 22  21  24  16  24  23  25  26  27  28  25  25   -  Acid-base deficit 0.0 - 2.0 mmol/L - 4.0  6.0  2.0  10.0  4.0  3.0  1.0  -  O2 Saturation % - 95  97  99  98  98   96  84  100  -    Details       Multiple values from one day are sorted in reverse-chronological order          Exercise Target Goals: Exercise Program Goal: Individual exercise prescription set using results from initial 6 min walk test and THRR while considering  patient's activity barriers and safety.   Exercise Prescription Goal: Initial exercise prescription builds to 30-45 minutes a day of aerobic activity, 2-3 days per week.  Home exercise guidelines will be given to patient during program as part of exercise prescription that the participant will acknowledge.   Education: Aerobic Exercise: - Group verbal and visual presentation on the components of exercise prescription. Introduces F.I.T.T principle from ACSM for exercise prescriptions.  Reviews F.I.T.T. principles of aerobic exercise including progression. Written material provided at class time. Flowsheet Row CARDIAC REHAB PHASE II EXERCISE from 08/28/2024 in [place] [facility]  Date 08/07/24  Educator jh  Instruction Review Code 2- Demonstrated Understanding    Education: Resistance Exercise: - Group verbal and visual presentation on the components of exercise prescription. Introduces F.I.T.T principle from ACSM for exercise prescriptions  Reviews F.I.T.T. principles of resistance exercise including progression. Written material provided at class time. Flowsheet Row CARDIAC REHAB PHASE II EXERCISE from 08/28/2024 in [place] [facility]  Date 08/14/24  Educator jh  Instruction Review Code 2- Demonstrated Understanding     Education: Exercise & Equipment Safety: - Individual verbal instruction and demonstration of equipment use and safety with use of the equipment.   Education: Exercise Physiology & General Exercise Guidelines: - Group verbal and written instruction with models to review the exercise physiology of the cardiovascular system and associated critical values. Provides general  exercise guidelines with specific guidelines to those with heart or lung disease. Written material provided at class time.   Education: Flexibility, Balance, Mind/Body Relaxation: - Group verbal and visual presentation with interactive activity on the components of exercise prescription. Introduces F.I.T.T principle from ACSM for exercise prescriptions. Reviews F.I.T.T. principles of flexibility and balance exercise training including progression. Also discusses the mind body connection.  Reviews various relaxation techniques to help reduce and manage stress (i.e. Deep breathing, progressive muscle relaxation, and visualization). Balance handout provided to take home. Written material provided at class time. Flowsheet  Row CARDIAC REHAB PHASE II EXERCISE from 08/28/2024 in [place] [facility]  Date 08/14/24  Educator jh  Instruction Review Code 2- Demonstrated Understanding    Activity Barriers & Risk Stratification:  Activity Barriers & Cardiac Risk Stratification - 05/16/24 9078       Activity Barriers & Cardiac Risk Stratification   Activity Barriers Back Problems;Chest Pain/Angina;Shortness of Breath   Multiple back sugeries for back.   Cardiac Risk Stratification High          6 Minute Walk:  6 Minute Walk     Row Name 05/20/24 1119 09/30/24 0941       6 Minute Walk   Phase Initial Discharge    Distance 1432 feet 1400 feet    Distance % Change -- -0.23 %    Distance Feet Change -- -32 ft    Walk Time 6 minutes 6 minutes    # of Rest Breaks 0 0    MPH 2.71 2.65    METS 3.31 3.23    RPE 8 13    VO2 Peak 11.57 11.3    Symptoms No Yes (comment)    Comments -- some pain from portacath procedure last week 3/10    Resting HR 69 bpm 93 bpm    Resting BP 110/70 124/64    Resting Oxygen Saturation  95 % --    Exercise Oxygen Saturation  during 6 min walk 97 % --    Max Ex. HR 99 bpm 95 bpm    Max Ex. BP 136/62 156/68    2 Minute Post BP 126/62 --        Oxygen Initial Assessment:   Oxygen Re-Evaluation:   Oxygen Discharge (Final Oxygen Re-Evaluation):   Initial Exercise Prescription:  Initial Exercise Prescription - 05/20/24 1100       Date of Initial Exercise RX and Referring Provider   Date 05/20/24    Referring Provider Court Carrier MD      Oxygen   Maintain Oxygen Saturation 88% or higher      Treadmill   MPH 2.7    Grade 1    Minutes 15    METs 3.44      REL-XR   Level 13    Speed 50    Minutes 15    METs 3.4      Prescription Details   Frequency (times per week) 1   starting with one to meet OOP   Duration Progress to 30 minutes of continuous aerobic without signs/symptoms of physical distress      Intensity   THRR 40-80% of Max Heartrate 102-137    Ratings of Perceived Exertion 11-13    Perceived Dyspnea 0-4      Progression   Progression Continue to progress workloads to maintain intensity without signs/symptoms of physical distress.      Resistance Training   Training Prescription Yes    Weight 5 lb    Reps 10-15          Perform Capillary Blood Glucose checks as needed.  Exercise Prescription Changes:   Exercise Prescription Changes     Row Name 05/20/24 1100 06/12/24 1300 07/17/24 1300 07/26/24 0800 07/26/24 0900     Response to Exercise   Blood Pressure (Admit) 110/70 104/62 126/82 124/66 --   Blood Pressure (Exercise) 136/62 144/72 -- -- --   Blood Pressure (Exit) 126/62 120/64 100/60 126/62 --   Heart Rate (Admit) 69 bpm 83 bpm 65 bpm 70 bpm --  Heart Rate (Exercise) 99 bpm 110 bpm 102 bpm 103 bpm --   Heart Rate (Exit) 76 bpm 90 bpm 85 bpm 77 bpm --   Oxygen Saturation (Admit) 95 % -- -- -- --   Oxygen Saturation (Exercise) 97 % -- -- -- --   Rating of Perceived Exertion (Exercise) 8 13 14 13  --   Symptoms none -- -- -- --   Comments walk test results -- -- -- --   Duration -- Continue with 30 min of aerobic exercise without signs/symptoms of physical distress.  Continue with 30 min of aerobic exercise without signs/symptoms of physical distress. Continue with 30 min of aerobic exercise without signs/symptoms of physical distress. --   Intensity -- THRR unchanged THRR unchanged THRR unchanged --     Progression   Progression -- Continue to progress workloads to maintain intensity without signs/symptoms of physical distress. Continue to progress workloads to maintain intensity without signs/symptoms of physical distress. Continue to progress workloads to maintain intensity without signs/symptoms of physical distress. --     Paramedic Prescription -- Yes Yes Yes --   Weight -- 5 -- 6 --   Reps -- 10-15 10-15 10-15 --     Treadmill   MPH -- 2.5 2.7 2.7 --   Grade -- 2.5 3 3  --   Minutes -- 15 15 15  --   METs -- 3.78 4.19 4.19 --     REL-XR   Level -- 5 6 5  --   Speed -- 56 50 56 --   Minutes -- 15 15 15  --   METs -- 2.7 3.9 4.4 --     Home Exercise Plan   Plans to continue exercise at -- -- -- -- Home (comment)   Frequency -- -- -- -- Add 2 additional days to program exercise sessions.   Initial Home Exercises Provided -- -- -- -- 07/26/24    Row Name 09/09/24 1500             Response to Exercise   Blood Pressure (Admit) 142/90       Blood Pressure (Exit) 142/80       Heart Rate (Admit) 82 bpm       Heart Rate (Exercise) 103 bpm       Heart Rate (Exit) 89 bpm       Rating of Perceived Exertion (Exercise) 15       Duration Continue with 30 min of aerobic exercise without signs/symptoms of physical distress.       Intensity THRR unchanged         Progression   Progression Continue to progress workloads to maintain intensity without signs/symptoms of physical distress.         Resistance Training   Training Prescription Yes       Weight 7       Reps 10-15         Treadmill   MPH 3       Grade 3       Minutes 15       METs 4.54         REL-XR   Level 5       Speed 49       Minutes 15       METs 3.7          Home Exercise Plan   Plans to continue exercise at Home (comment)       Frequency Add 2 additional days  to program exercise sessions.          Exercise Comments:   Exercise Comments     Row Name 05/22/24 [phone]           Exercise Comments First full day of exercise!  Patient was oriented to gym and equipment including functions, settings, policies, and procedures.  Patient's individual exercise prescription and treatment plan were reviewed.  All starting workloads were established based on the results of the 6 minute walk test done at initial orientation visit.  The plan for exercise progression was also introduced and progression will be customized based on patient's performance and goals.          Exercise Goals and Review:   Exercise Goals     Row Name 05/20/24 1122             Exercise Goals   Increase Physical Activity Yes       Intervention Provide advice, education, support and counseling about physical activity/exercise needs.;Develop an individualized exercise prescription for aerobic and resistive training based on initial evaluation findings, risk stratification, comorbidities and participant's personal goals.       Expected Outcomes Short Term: Attend rehab on a regular basis to increase amount of physical activity.;Long Term: Add in home exercise to make exercise part of routine and to increase amount of physical activity.;Long Term: Exercising regularly at least 3-5 days a week.       Increase Strength and Stamina Yes       Intervention Provide advice, education, support and counseling about physical activity/exercise needs.;Develop an individualized exercise prescription for aerobic and resistive training based on initial evaluation findings, risk stratification, comorbidities and participant's personal goals.       Expected Outcomes Short Term: Increase workloads from initial exercise prescription for resistance, speed, and METs.;Long Term: Improve  cardiorespiratory fitness, muscular endurance and strength as measured by increased METs and functional capacity ( );Short Term: Perform resistance training exercises routinely during rehab and add in resistance training at home       Able to understand and use rate of perceived exertion (RPE) scale Yes       Intervention Provide education and explanation on how to use RPE scale       Expected Outcomes Short Term: Able to use RPE daily in rehab to express subjective intensity level;Long Term:  Able to use RPE to guide intensity level when exercising independently       Able to understand and use Dyspnea scale Yes       Intervention Provide education and explanation on how to use Dyspnea scale       Expected Outcomes Short Term: Able to use Dyspnea scale daily in rehab to express subjective sense of shortness of breath during exertion;Long Term: Able to use Dyspnea scale to guide intensity level when exercising independently       Knowledge and understanding of Target Heart Rate Range (THRR) Yes       Intervention Provide education and explanation of THRR including how the numbers were predicted and where they are located for reference       Expected Outcomes Short Term: Able to state/look up THRR;Short Term: Able to use daily as guideline for intensity in rehab;Long Term: Able to use THRR to govern intensity when exercising independently       Able to check pulse independently Yes       Intervention Provide education and demonstration on how to check pulse in carotid and radial arteries.;Review the importance  of being able to check your own pulse for safety during independent exercise       Expected Outcomes Short Term: Able to explain why pulse checking is important during independent exercise;Long Term: Able to check pulse independently and accurately       Understanding of Exercise Prescription Yes       Intervention Provide education, explanation, and written materials on patient's individual  exercise prescription       Expected Outcomes Short Term: Able to explain program exercise prescription;Long Term: Able to explain home exercise prescription to exercise independently          Exercise Goals Re-Evaluation :  Exercise Goals Re-Evaluation     Row Name 06/17/24 0951 07/15/24 1045 08/07/24 1116 09/12/24 0930 09/13/24 0947     Exercise Goal Re-Evaluation   Exercise Goals Review Increase Physical Activity;Increase Strength and Stamina;Understanding of Exercise Prescription Increase Strength and Stamina;Increase Physical Activity Increase Physical Activity;Increase Strength and Stamina;Understanding of Exercise Prescription Increase Physical Activity;Increase Strength and Stamina;Understanding of Exercise Prescription Increase Physical Activity;Increase Strength and Stamina;Understanding of Exercise Prescription   Comments [name] states that he just joined the [facility] here in [place] and on the days he is not here, he goes there and swims in the pool. [name] states that on the days he is not here, he gos to the [facility] and swims in the pool. [name] is doing well in rehab.  He is still going to [facility] to swim on his off days from rehab. He does feel like his stamina is getting better and it is helping him.  He is getting ready to start cancer treatments with both radiation and chemo.  He was encouraged to watch his fatigue and do his best to keep moving.  He is planning to schedule his sessions around rehab so that he can still attend and finish program. [name] has completed 29 sessions of CR. He has missed a few classes due to not feeling well due to cancer treatment. His level on the XR is 4 with RPE of 14 and his is walking at a speed of grade 3 on the treadmill. Will continue to monitor and progress as able [name] is doing well in rehab.  He is still going to [facility] on his off days to swim.  His rest day is Sunday.  He is nearing graduation and we talked about what to do for exercise after graduation.  He is gearing up for radiation therapy. He does have a stationary bike and we talked about the importance of continuing to exercise through treatments.   Expected Outcomes Short: Continue to attend rehab. Long:Incorporate more exercise at home. Short: Continue to attend rehab. Long:Incorporate more exercise at home. Short: Continue to exercise throughout cancer treatments Long: Continue to maintain strength Short: continue with levels and increase when feeling well   long: continue to attend rehab and exercise at home when feeling well Short: Improve post Long: Conitnue to make exercise a priority.    Row Name 09/27/24 1001             Exercise Goal Re-Evaluation   Exercise Goals Review Increase Strength and Stamina       Comments [name] is doing great in rehab. He starts chemo next week, so he is really hoping he can continue to make every class. He also talks about once he is done with the program he wants to continue to come and join the [facility] class.       Expected  Outcomes Short: Improve post Long: Conitnue to make exercise a priority.          Discharge Exercise Prescription (Final Exercise Prescription Changes):  Exercise Prescription Changes - 09/09/24 1500       Response to Exercise   Blood Pressure (Admit) 142/90    Blood Pressure (Exit) 142/80    Heart Rate (Admit) 82 bpm    Heart Rate (Exercise) 103 bpm    Heart Rate (Exit) 89 bpm    Rating of Perceived Exertion (Exercise) 15    Duration Continue with 30 min of aerobic exercise without signs/symptoms of physical distress.    Intensity THRR unchanged      Progression   Progression Continue to progress workloads to maintain intensity without signs/symptoms of physical distress.      Resistance Training   Training Prescription Yes    Weight 7    Reps 10-15      Treadmill   MPH 3    Grade 3    Minutes 15    METs 4.54      REL-XR   Level 5    Speed 49    Minutes 15    METs 3.7      Home Exercise Plan    Plans to continue exercise at Home (comment)    Frequency Add 2 additional days to program exercise sessions.          Nutrition:  Target Goals: Understanding of nutrition guidelines, daily intake of sodium 1500mg , cholesterol 200mg , calories 30% from fat and 7% or less from saturated fats, daily to have 5 or more servings of fruits and vegetables.  Education: Nutrition 1 -Group instruction provided by verbal, written material, interactive activities, discussions, models, and posters to present general guidelines for heart healthy nutrition including macronutrients, label reading, and promoting whole foods over processed counterparts. Education serves as pensions consultant of discussion of heart healthy eating for all. Written material provided at class time.    Education: Nutrition 2 -Group instruction provided by verbal, written material, interactive activities, discussions, models, and posters to present general guidelines for heart healthy nutrition including sodium, cholesterol, and saturated fat. Providing guidance of habit forming to improve blood pressure, cholesterol, and body weight. Written material provided at class time.     Biometrics:  Pre Biometrics - 05/20/24 1122       Pre Biometrics   Height 6' 1 (1.854 m)    Weight 226 lb 8 oz (102.7 kg)    Waist Circumference 31 inches    Hip Circumference 44 inches    Waist to Hip Ratio 0.7 %    BMI (Calculated) 29.89    Grip Strength 32.7 kg    Single Leg Stand 28.2 seconds          Post Biometrics - 09/30/24 0943        Post  Biometrics   Height 6' 1 (1.854 m)    Weight 239 lb 4.8 oz (108.5 kg)    Waist Circumference 41 inches    Hip Circumference 44 inches    Waist to Hip Ratio 0.93 %    BMI (Calculated) 31.58    Grip Strength 30.2 kg    Single Leg Stand 30 seconds          Nutrition Therapy Plan and Nutrition Goals:   Nutrition Assessments:  MEDIFICTS Score Key: >=70 Need to make dietary changes   40-70 Heart Healthy Diet <= 40 Therapeutic Level Cholesterol Diet  Flowsheet Row Documentation from 09/23/2024 in  [place]  Picture Your Plate Total Score on Discharge 57   Picture Your Plate Scores: <59 Unhealthy dietary pattern with much room for improvement. 41-50 Dietary pattern unlikely to meet recommendations for good health and room for improvement. 51-60 More healthful dietary pattern, with some room for improvement.  >60 Healthy dietary pattern, although there may be some specific behaviors that could be improved.    Nutrition Goals Re-Evaluation:  Nutrition Goals Re-Evaluation     Row Name 06/17/24 0947 07/15/24 1043 08/07/24 1123 09/13/24 0951 09/27/24 0953     Goals   Nutrition Goal Loose weight. Maintain weight. Short: Continue to attend rehab. Long: Follow the cancer center's diet, yet still following a heart healthy diet. Short; Increase protein Long: Continue to make sure he gets enough calories Short; Increase protein Long: Continue to make sure he gets enough calories   Comment [name] states he wants his goal weight to be 210-215 lbs. He is currently at 230 lbs. He states he eats lots of meat, tries to incorporate veggies and fruits. His water intake is adequate at 4-5 bottles per day, and he is an all day coffee drinker. Will drink 3 cups of caffeinated coffee and the rest of the day he will do de-caf. [name] states that his diet is currently ok. The cancer center told him he needed to try and gain some weight because once he starts treatments it till likely make him sick and loose weight. [name] is prepping to do a mostly liquid diet for a bit as he under goes cancer treatments.  He has his smoothie machine ready. They are going to pull his teeth which will also inhibit his eating. We talked about the importance of making sure he gets enough protein to avoid caexia as well. [name] is getting ready to start his cancer treatments with portacath being placed on  09/25/24. He is still trying to gain weight in prep for cancer treatments.  He is trying to get the protein in.  We talked about making sure he gets calories in once he starts treatments.  He tries to get protein and vegetables.  He is sticking to fresh or frozen veggies. Bill starts chemo next week. He also had 17 teeth pulled last week so his mouth is pretty sore, he notes. [name] is eating softer foods due to that, but as of right now still eating good and eating enough protein and calories currently. He is a little concerned how that may change after treatments start.   Expected Outcome Short: Incorporate a more colorful diet, (fruits and veggies). Long: Cut down on the caffeine. Short: Continue to attend rehab. Long: Follow the cancer center's diet, yet still following a heart healthy diet. Short; Increase protein Long: Continue to make sure he gets enough calories Short: Make sure to eat even with treatments LOng: Conitnue to focus on healthy eating Short: try to maintain protein and calorie intake through treatments. Long: eating healthly well balanced meals      Nutrition Goals Discharge (Final Nutrition Goals Re-Evaluation):  Nutrition Goals Re-Evaluation - 09/27/24 0953       Goals   Nutrition Goal Short; Increase protein Long: Continue to make sure he gets enough calories    Comment Bill starts chemo next week. He also had 17 teeth pulled last week so his mouth is pretty sore, he notes. [name] is eating softer foods due to that, but as of right now still eating good and eating enough protein and calories  currently. He is a little concerned how that may change after treatments start.    Expected Outcome Short: try to maintain protein and calorie intake through treatments. Long: eating healthly well balanced meals          Psychosocial: Target Goals: Acknowledge presence or absence of significant depression and/or stress, maximize coping skills, provide positive support system.  Participant is able to verbalize types and ability to use techniques and skills needed for reducing stress and depression.   Education: Stress, Anxiety, and Depression - Group verbal and visual presentation to define topics covered.  Reviews how body is impacted by stress, anxiety, and depression.  Also discusses healthy ways to reduce stress and to treat/manage anxiety and depression. Written material provided at class time. Flowsheet Row CARDIAC REHAB PHASE II EXERCISE from 08/28/2024 in [place] [facility]  Date 08/28/24  Educator bs  Instruction Review Code 1- Verbalizes Understanding    Education: Sleep Hygiene -Provides group verbal and written instruction about how sleep can affect your health.  Define sleep hygiene, discuss sleep cycles and impact of sleep habits. Review good sleep hygiene tips.   Initial Review & Psychosocial Screening:  Initial Psych Review & Screening - 05/16/24 0944       Initial Review   Current issues with Current Sleep Concerns      Family Dynamics   Good Support System? Yes      Barriers   Psychosocial barriers to participate in program The patient should benefit from training in stress management and relaxation.;There are no identifiable barriers or psychosocial needs.      Screening Interventions   Interventions To provide support and resources with identified psychosocial needs;Encouraged to exercise;Provide feedback about the scores to participant    Expected Outcomes Short Term goal: Utilizing psychosocial counselor, staff and physician to assist with identification of specific Stressors or current issues interfering with healing process. Setting desired goal for each stressor or current issue identified.;Long Term Goal: Stressors or current issues are controlled or eliminated.;Short Term goal: Identification and review with participant of any Quality of Life or Depression concerns found by scoring the questionnaire.;Long Term goal: The  participant improves quality of Life and PHQ9 Scores as seen by post scores and/or verbalization of changes          Quality of Life Scores:   Quality of Life - 09/23/24 1144       Quality of Life   Select Quality of Life      Quality of Life Scores   Health/Function Pre 19.2 %    Health/Function Post 23.3 %    Health/Function % Change 21.35 %    Socioeconomic Pre 26.57 %    Socioeconomic Post 26.75 %    Socioeconomic % Change  0.68 %    Psych/Spiritual Pre 12.93 %    Psych/Spiritual Post 19.57 %    Psych/Spiritual % Change 51.35 %    Family Pre 27.5 %    Family Post 22.5 %    Family % Change -18.18 %    GLOBAL Pre 20.03 %    GLOBAL Post 23.05 %    GLOBAL % Change 15.08 %         Scores of 19 and below usually indicate a poorer quality of life in these areas.  A difference of  2-3 points is a clinically meaningful difference.  A difference of 2-3 points in the total score of the Quality of Life Index has been associated with significant improvement in overall  quality of life, self-image, physical symptoms, and general health in studies assessing change in quality of life.  PHQ-9: Review Flowsheet  More data exists      10/10/2024 10/09/2024 10/03/2024 09/23/2024 08/07/2024  Depression screen PHQ 2/9  Decreased Interest 0 0 0 1 1  Down, Depressed, Hopeless 0 0 0 1 1  PHQ - 2 Score 0 0 0 2 2  Altered sleeping - 0 0 2 1  Tired, decreased energy - 0 0 2 1  Change in appetite - 0 0 1 0  Feeling bad or failure about yourself  - 0 0 0 0  Trouble concentrating - 0 0 1 1  Moving slowly or fidgety/restless - 0 0 0 0  Suicidal thoughts - 0 0 0 0  PHQ-9 Score - 0 0 8 5  Difficult doing work/chores - Not difficult at all - Somewhat difficult Not difficult at all   Interpretation of Total Score  Total Score Depression Severity:  1-4 = Minimal depression, 5-9 = Mild depression, 10-14 = Moderate depression, 15-19 = Moderately severe depression, 20-27 = Severe depression    Psychosocial Evaluation and Intervention:  Psychosocial Evaluation - 05/16/24 0945       Psychosocial Evaluation & Interventions   Interventions Stress management education;Relaxation education;Encouraged to exercise with the program and follow exercise prescription    Comments 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 He denies any depression, anxiety or stressors. He does report some difficulty staying asleep and has been taking Advil PM long term for sleep. He lives alone. He says he has friends and neigbors that would help him out if needed. 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 He has a $25 copayment and is about to meet his out of pocket maximun which will end the copayment. He is only going to do 1 day/week until he meets the OOP and then go to 3 days/week. His goals for the program are to get his stamina back; improve his muscle tone and get back in shape overall. He has no barriers identified to complete the program.    Expected Outcomes Short Term: Patient will start the program and attend consistently. Long Term: Patient will complete the program meeting personal goals.    Continue Psychosocial Services  Follow up required by staff          Psychosocial Re-Evaluation:  Psychosocial Re-Evaluation     Row Name 06/17/24 0946 07/15/24 1034 08/07/24 1119 09/13/24 0950 09/27/24 0948     Psychosocial Re-Evaluation   Current issues with None Identified Current Stress Concerns Current Stress Concerns;Current Sleep Concerns Current Stress Concerns;Current Sleep Concerns Current Stress Concerns;Current Sleep Concerns   Comments [name] identifies no stressors in his life at the moment. 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 [name] is currently stressed out and anxious about his cancer issues going on. He has his biopsy next week, so  once they do that he will have a better idea of what the treatment options are and how long they will be. [name] is getting set up to start his cancer treatments.  He sees oncologist today to talk about chemo.  They are planning to do 7 weeks of radiation and chemo.  He has declined sugery.  He needs to have his teeth removed prior to treatments.  He is ready to face it head on as he says his alternative is to die.  He wants to take it on.  He does have a strong family history of cancer.  He has not been sleeping well with all of this going on.  Last night the anxiety triggered chest pain and his NTG did help with relief.  We talked about signs to be aware of and to watch for his fatigue.  He feels he is ready and will keep us  posted on how he is doing. [name] is having his teeth pulled next week. He is also getting fitted for his cage on 10/13 and portacath placed on 10/15.  He is gearing up mentally for the treatments. He has been fighting with some pain in his throat leading up to treatments.  He is still not sleeping well.  He tries to deal with it the best he can. Bill had his teeth pulled, 17 he says. He also had his port a cath placed 2 days ago. He says he is dealing with everythig fairly well. He starts chemo next week so he is preparing himself for that. He seems to be in great spirits, says he's ready to get treatment started.   Expected Outcomes Short: Continue to attend rehab. Long: Continue to have a stress free lifestyle. Short: Continue to attend rehab. Long: Find stress outlets to help him with the anxiety he is experiencing. Short; Continue to attend rehab for mental boost Long: COntinue to cope with cancer treatments Short: Stay positive through treatments Long: Conitnue to rest when he can Short: staying positive through chemo/radiation. Long: continue to rest and relax   Interventions Encouraged to attend Cardiac Rehabilitation for the exercise Encouraged to attend Cardiac Rehabilitation for the  exercise;Stress management education;Relaxation education Encouraged to attend Cardiac Rehabilitation for the exercise Encouraged to attend Cardiac Rehabilitation for the exercise Encouraged to attend Cardiac Rehabilitation for the exercise   Continue Psychosocial Services  Follow up required by staff Follow up required by staff Follow up required by staff Follow up required by staff Follow up required by staff      Psychosocial Discharge (Final Psychosocial Re-Evaluation):  Psychosocial Re-Evaluation - 09/27/24 0948       Psychosocial Re-Evaluation   Current issues with Current Stress Concerns;Current Sleep Concerns    Comments Bill had his teeth pulled, 17 he says. He also had his port a cath placed 2 days ago. He says he is dealing with everythig fairly well. He starts chemo next week so he is preparing himself for that. He seems to be in great spirits, says he's ready to get treatment started.    Expected Outcomes Short: staying positive through chemo/radiation. Long: continue to rest and relax    Interventions Encouraged to attend Cardiac Rehabilitation for the exercise    Continue Psychosocial Services  Follow up required by staff          Vocational Rehabilitation: Provide vocational rehab assistance to qualifying candidates.   Vocational Rehab Evaluation & Intervention:  Vocational Rehab - 05/16/24 9061       Initial Vocational Rehab Evaluation & Intervention   Assessment shows need for Vocational Rehabilitation No      Vocational Rehab Re-Evaulation   Comments Disabled.          Education: Education Goals: Education classes will be provided on a variety of topics geared toward better understanding of heart health and risk factor modification. Participant will state understanding/return demonstration of topics presented as noted  by education test scores.  Learning Barriers/Preferences:  Learning Barriers/Preferences - 05/16/24 9061       Learning  Barriers/Preferences   Learning Barriers None    Learning Preferences Written Material;Audio;Skilled Demonstration          General Cardiac Education Topics:  AED/CPR: - Group verbal and written instruction with the use of models to demonstrate the basic use of the AED with the basic ABC's of resuscitation.   Test and Procedures: - Group verbal and visual presentation and models provide information about basic cardiac anatomy and function. Reviews the testing methods done to diagnose heart disease and the outcomes of the test results. Describes the treatment choices: Medical Management, Angioplasty, or Coronary Bypass Surgery for treating various heart conditions including Myocardial Infarction, Angina, Valve Disease, and Cardiac Arrhythmias. Written material provided at class time.   Medication Safety: - Group verbal and visual instruction to review commonly prescribed medications for heart and lung disease. Reviews the medication, class of the drug, and side effects. Includes the steps to properly store meds and maintain the prescription regimen. Written material provided at class time.   Intimacy: - Group verbal instruction through game format to discuss how heart and lung disease can affect sexual intimacy. Written material provided at class time. Flowsheet Row CARDIAC REHAB PHASE II EXERCISE from 08/28/2024 in [place] [facility]  Date 08/07/24  Educator jh  Instruction Review Code 2- Demonstrated Understanding    Know Your Numbers and Heart Failure: - Group verbal and visual instruction to discuss disease risk factors for cardiac and pulmonary disease and treatment options.  Reviews associated critical values for Overweight/Obesity, Hypertension, Cholesterol, and Diabetes.  Discusses basics of heart failure: signs/symptoms and treatments.  Introduces Heart Failure Zone chart for action plan for heart failure. Written material provided at class time.   Infection  Prevention: - Provides verbal and written material to individual with discussion of infection control including proper hand washing and proper equipment cleaning during exercise session.   Falls Prevention: - Provides verbal and written material to individual with discussion of falls prevention and safety.   Other: -Provides group and verbal instruction on various topics (see comments)   Knowledge Questionnaire Score:  Knowledge Questionnaire Score - 09/23/24 1142       Knowledge Questionnaire Score   Post Score 25/28          Core Components/Risk Factors/Patient Goals at Admission:  Personal Goals and Risk Factors at Admission - 05/20/24 1123       Core Components/Risk Factors/Patient Goals on Admission    Weight Management Weight Loss;Yes    Intervention Weight Management: Develop a combined nutrition and exercise program designed to reach desired caloric intake, while maintaining appropriate intake of nutrient and fiber, sodium and fats, and appropriate energy expenditure required for the weight goal.;Weight Management: Provide education and appropriate resources to help participant work on and attain dietary goals.;Weight Management/Obesity: Establish reasonable short term and long term weight goals.    Admit Weight 226 lb 8 oz (102.7 kg)    Goal Weight: Short Term 221 lb (100.2 kg)    Goal Weight: Long Term 216 lb (98 kg)    Expected Outcomes Short Term: Continue to assess and modify interventions until short term weight is achieved;Long Term: Adherence to nutrition and physical activity/exercise program aimed toward attainment of established weight goal;Weight Loss: Understanding of general recommendations for a balanced deficit meal plan, which promotes 1-2 lb weight loss per week and includes a negative energy balance of  [phone] kcal/d;Understanding recommendations for meals to include 15-35% energy as protein, 25-35% energy from fat, 35-60% energy from carbohydrates, less  than 200mg  of dietary cholesterol, 20-35 gm of total fiber daily;Understanding of distribution of calorie intake throughout the day with the consumption of 4-5 meals/snacks    Improve shortness of breath with ADL's Yes    Intervention Provide education, individualized exercise plan and daily activity instruction to help decrease symptoms of SOB with activities of daily living.    Expected Outcomes Short Term: Improve cardiorespiratory fitness to achieve a reduction of symptoms when performing ADLs;Long Term: Be able to perform more ADLs without symptoms or delay the onset of symptoms    Hypertension Yes    Intervention Provide education on lifestyle modifcations including regular physical activity/exercise, weight management, moderate sodium restriction and increased consumption of fresh fruit, vegetables, and low fat dairy, alcohol moderation, and smoking cessation.;Monitor prescription use compliance.    Expected Outcomes Short Term: Continued assessment and intervention until BP is < 140/70mm HG in hypertensive participants. < 130/61mm HG in hypertensive participants with diabetes, heart failure or chronic kidney disease.;Long Term: Maintenance of blood pressure at goal levels.    Lipids Yes    Intervention Provide education and support for participant on nutrition & aerobic/resistive exercise along with prescribed medications to achieve LDL 70mg , HDL >40mg .    Expected Outcomes Short Term: Participant states understanding of desired cholesterol values and is compliant with medications prescribed. Participant is following exercise prescription and nutrition guidelines.;Long Term: Cholesterol controlled with medications as prescribed, with individualized exercise RX and with personalized nutrition plan. Value goals: LDL < 70mg , HDL > 40 mg.          Education:Diabetes - Individual verbal and written instruction to review signs/symptoms of diabetes, desired ranges of glucose level fasting, after  meals and with exercise. Acknowledge that pre and post exercise glucose checks will be done for 3 sessions at entry of program.   Core Components/Risk Factors/Patient Goals Review:   Goals and Risk Factor Review     Row Name 06/17/24 0949 07/15/24 1044 08/07/24 1124 09/13/24 0954 09/27/24 0957     Core Components/Risk Factors/Patient Goals Review   Personal Goals Review Weight Management/Obesity;Lipids Weight Management/Obesity;Lipids;Hypertension Weight Management/Obesity;Lipids;Hypertension Weight Management/Obesity;Lipids;Hypertension Lipids;Hypertension   Review [name] states he checks all his vitals regularly at home. His BP runs good, usually only 4-10 points off of what we get. His pulse ox runs about 95-97% which is good for someone who used to be a heavy smoker. Takes all medications as prescribed. [name] states that he checks his BP 3-4 times a day. He also checks his pulse oximetry at home which it runs 96-97%, which he considers good since he smoked for 40 years. He takes all his medications as prescribed. [name] is doing well in rehab.  He contineus to keep eye on blood pressre and encourage to really watch it as he starts up his cancer treatments. He is keeping eye on his weight and trying to gain a little bulk prior to starting treatments.  He was encouraged to talk with his oncologist to make sure that there are no medication interactions prior to starting his chemo. [name] has been working on weight gain to prep for cancer treatments that start this month.  His pressures have been running a little on the higher side with all the extra stress. He has also been having a chronic headache. He has tried a variety of things to help.  Sleep helps some. Bill's blood pressures  have gotten better. Bill also noted his headaches were coming from a new glasses prescription, so he switched back to his old glasses and it is better. [name] will start chemo next week, port a cath was placed 2 days ago.   Expected  Outcomes Short: Continue to attend rehab. Long: Continue checking vitals at home. Short: Continue to attend rehab. Long: Continue checking vitals at home. Short; Contineu to keep eye on blood pressure lOng: Continue to montior symptoms and weight Short; Continue to keep eye on blood pressures Long Continue to monitor risk factors Short: start chemo treatments, speak with MD if anything is bothering him. Long: continue to monitor health and speak with MD for any significant changes.      Core Components/Risk Factors/Patient Goals at Discharge (Final Review):   Goals and Risk Factor Review - 09/27/24 0957       Core Components/Risk Factors/Patient Goals Review   Personal Goals Review Lipids;Hypertension    Review Bill's blood pressures have gotten better. Bill also noted his headaches were coming from a new glasses prescription, so he switched back to his old glasses and it is better. [name] will start chemo next week, port a cath was placed 2 days ago.    Expected Outcomes Short: start chemo treatments, speak with MD if anything is bothering him. Long: continue to monitor health and speak with MD for any significant changes.          ITP Comments:  ITP Comments     Row Name 05/16/24 [id number] 05/20/24 1143 05/22/24 0923 05/29/24 0928 06/24/24 0933   ITP Comments Virtual orientation visit completed for Cardiac rehab with S/P CABGx3. On-site orientation visit scheduled for 05/20/24 at 10. Patient arrived for 1st visit/orientation/education at 1000. Patient was referred to CR by Dr. Linnie Rayas and attending cardiologist Peter Jordan due to S/P CABGx3. During orientation advised patient on arrival and appointment times what to wear, what to do before, during and after exercise. Reviewed attendance and class policy.  Pt is scheduled to return Cardiac Rehab on 05/22/24 at 915. Pt was advised to come to class 15 minutes before class starts.  Discussed RPE/Dpysnea scales. Patient participated in warm up  stretches. Patient was able to complete 6 minute walk test.  Telemetry:NSR. Patient was measured for the equipment. Discussed equipment safety with patient. Took patient pre-anthropometric measurements. Patient finished visit at 1045. First full day of exercise!  Patient was oriented to gym and equipment including functions, settings, policies, and procedures.  Patient's individual exercise prescription and treatment plan were reviewed.  All starting workloads were established based on the results of the 6 minute walk test done at initial orientation visit.  The plan for exercise progression was also introduced and progression will be customized based on patient's performance and goals. 30 day review completed. ITP sent to Dr.Jonathan Branch, Medical Director of Cardiac Rehab. Continue with ITP unless changes are made by physician. New to program. He has completed 1 session since his orientation visit. He is only attending 1 day/week due to his co-payment. Patient will be out rest of week for oncology work up and biopsy.    Row Name 06/26/24 0821 07/24/24 0951 08/09/24 0935 08/21/24 1417 09/13/24 0932   ITP Comments 30 day review completed. ITP sent to Dr. Dorn Ross, Medical Director of Cardiac Rehab. Continue with ITP unless changes are made by physician. 30 day review completed. ITP sent to Dr. Dorn Ross, Medical Director of Cardiac Rehab. Continue with ITP unless changes are  made by physician.  [name] is out this week with MD appt Monday and biopsy surgery today. 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 30 day review completed. ITP sent to Dr. Dorn Ross, Medical Director of Cardiac Rehab. Continue with ITP unless changes are made by physician. [name] is having his teeth pulled next week.  He is also getting fitted for his cage on 10/13 and portacath placed on 10/15.    Row Name 09/18/24 0846 10/16/24 0759         ITP Comments 30 day review completed. ITP sent to Dr.  Dorn Ross, Medical Director of Cardiac Rehab. Continue with ITP unless changes are made by physician. 30 day review completed. ITP sent to Dr. Dorn Ross, Medical Director of Cardiac Rehab. Continue with ITP unless changes are made by physician.         Comments: 30 day review

## 2024-10-16 NOTE — Assessment & Plan Note (Addendum)
 Experiencing intermittent pain managed with hydrocodone  7.5 mg and acetaminophen . Pain is variable, sometimes more like a headache. Prefers to avoid stronger medications to prevent dependency. Using baking soda and salt mouthwash for oral discomfort. - Continue hydrocodone  7.5 mg and acetaminophen  as needed for pain. Considering switching to oxycodone  to avoid acetaminophen -related liver issues, if he needs higher doses. - Use baking soda and salt mouthwash for oral discomfort. - Monitor pain levels and contact provider if pain becomes unmanageable.

## 2024-10-17 ENCOUNTER — Inpatient Hospital Stay

## 2024-10-17 ENCOUNTER — Ambulatory Visit
Admission: RE | Admit: 2024-10-17 | Discharge: 2024-10-17 | Disposition: A | Discharge status: Home / Self Care | Source: Ambulatory Visit | Attending: Radiation Oncology | Admitting: Radiation Oncology

## 2024-10-17 ENCOUNTER — Other Ambulatory Visit: Payer: Self-pay

## 2024-10-17 ENCOUNTER — Ambulatory Visit: Attending: Radiation Oncology | Admitting: Physical Therapy

## 2024-10-17 ENCOUNTER — Ambulatory Visit: Attending: Radiation Oncology

## 2024-10-17 ENCOUNTER — Inpatient Hospital Stay: Admitting: Dietician

## 2024-10-17 ENCOUNTER — Encounter: Payer: Self-pay | Admitting: Physical Therapy

## 2024-10-17 VITALS — BP 129/75 | HR 80 | Temp 98.0°F | Resp 16

## 2024-10-17 DIAGNOSIS — C139 Malignant neoplasm of hypopharynx, unspecified: Secondary | ICD-10-CM | POA: Insufficient documentation

## 2024-10-17 DIAGNOSIS — C12 Malignant neoplasm of pyriform sinus: Secondary | ICD-10-CM

## 2024-10-17 DIAGNOSIS — R1310 Dysphagia, unspecified: Secondary | ICD-10-CM | POA: Diagnosis present

## 2024-10-17 DIAGNOSIS — R293 Abnormal posture: Secondary | ICD-10-CM | POA: Diagnosis present

## 2024-10-17 LAB — RAD ONC ARIA SESSION SUMMARY
Course Elapsed Days: 14
Plan Fractions Treated to Date: 10
Plan Prescribed Dose Per Fraction: 2 Gy
Plan Total Fractions Prescribed: 35
Plan Total Prescribed Dose: 70 Gy
Reference Point Dosage Given to Date: 20 Gy
Reference Point Session Dosage Given: 2 Gy
Session Number: 10

## 2024-10-17 MED ORDER — SODIUM CHLORIDE 0.9 % IV SOLN: INTRAVENOUS | Status: DC

## 2024-10-17 MED ORDER — MAGNESIUM SULFATE 2 GM/50ML IV SOLN
2.0000 g | Freq: Once | INTRAVENOUS | Status: AC
  Administered 2024-10-17: 2 g via INTRAVENOUS
  Filled 2024-10-17: qty 50

## 2024-10-17 MED ORDER — SODIUM CHLORIDE 0.9 % IV SOLN
150.0000 mg | Freq: Once | INTRAVENOUS | Status: AC
  Administered 2024-10-17: 150 mg via INTRAVENOUS
  Filled 2024-10-17: qty 150

## 2024-10-17 MED ORDER — SODIUM CHLORIDE 0.9 % IV SOLN
40.0000 mg/m2 | Freq: Once | INTRAVENOUS | Status: AC
  Administered 2024-10-17: 100 mg via INTRAVENOUS
  Filled 2024-10-17: qty 100

## 2024-10-17 MED ORDER — POTASSIUM CHLORIDE IN NACL 20-0.9 MEQ/L-% IV SOLN
Freq: Once | INTRAVENOUS | Status: AC
  Filled 2024-10-17: qty 1000

## 2024-10-17 MED ORDER — ACETAMINOPHEN 500 MG PO TABS
1000.0000 mg | ORAL_TABLET | Freq: Once | ORAL | Status: AC
  Administered 2024-10-17: 1000 mg via ORAL
  Filled 2024-10-17: qty 2

## 2024-10-17 MED ORDER — PALONOSETRON HCL INJECTION 0.25 MG/5ML
0.2500 mg | Freq: Once | INTRAVENOUS | Status: AC
  Administered 2024-10-17: 0.25 mg via INTRAVENOUS
  Filled 2024-10-17: qty 5

## 2024-10-17 NOTE — Patient Instructions (Signed)
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
## 2024-10-17 NOTE — Patient Instructions (Signed)
 SWALLOWING EXERCISES Do these 5-6 days/week until 6 months after your last day of radiation, then 2 days per week afterwards You can use 1-2 drops of liquid to help you swallow, if your mouth gets dry  Effortful Swallows - Press your tongue against the roof of your mouth for 3 seconds, then swallow as hard as you can - Do at least 20 reps/day, in sets of 5-10  Masako Swallow - swallow with your tongue sticking out - Stick tongue out past your lips and gently bite tongue with your teeth - Swallow, while holding your tongue with your teeth - Do at least 20 reps/day, in sets of 5-10   Shaker Exercise - head lift - Lie flat on your back in your bed, the floor, or a couch  - Raise your head and look at your feet - KEEP YOUR SHOULDERS DOWN - HOLD FOR 45-60 SECONDS, then lower your head back down - Repeat 3 times, 2-3 times a day  [employer] - "squeeze swallow" exercise - Swallow, and squeeze tight to keep your Adam's Apple up - Hold the squeeze for 5-7 seconds - then relax - Do at least 20 reps/day, in sets of 5-10

## 2024-10-18 ENCOUNTER — Ambulatory Visit

## 2024-10-18 ENCOUNTER — Telehealth: Payer: Self-pay | Admitting: Radiation Oncology

## 2024-10-18 ENCOUNTER — Other Ambulatory Visit: Payer: Self-pay | Admitting: Oncology

## 2024-10-18 NOTE — Telephone Encounter (Signed)
 11/7 Patient left voicemail to cancel his treatment appt for today due to being very sick.  Email forward to Support and copied L4 machine, so they are aware.

## 2024-10-21 ENCOUNTER — Ambulatory Visit

## 2024-10-21 NOTE — Progress Notes (Signed)
 Oncology Nurse Navigator Documentation   I received an incoming telephone call from Mr. Thibeau this morning. He reports a fever of 101 this morning and he will not be coming in for his scheduled radiation appointment. I offered evaluation by Dr. Izell or our symptom management clinic but he declined. I have notified Dr. Izell and Dr. Autumn. I informed him that a radiation appointment will be added to the end of his treatment and he voiced his understanding. I did encourage him to come tomorrow to continue treatment without more gaps in care.  [name] RN, BSN, OCN Head & Neck Oncology Nurse Navigator [facility] [place] Phone # [phone]  Fax # [phone]

## 2024-10-22 ENCOUNTER — Ambulatory Visit
Admission: RE | Admit: 2024-10-22 | Discharge: 2024-10-22 | Disposition: A | Discharge status: Home / Self Care | Source: Ambulatory Visit | Attending: Radiation Oncology | Admitting: Radiation Oncology

## 2024-10-22 ENCOUNTER — Other Ambulatory Visit: Payer: Self-pay

## 2024-10-22 LAB — RAD ONC ARIA SESSION SUMMARY
Course Elapsed Days: 19
Plan Fractions Treated to Date: 11
Plan Prescribed Dose Per Fraction: 2 Gy
Plan Total Fractions Prescribed: 35
Plan Total Prescribed Dose: 70 Gy
Reference Point Dosage Given to Date: 22 Gy
Reference Point Session Dosage Given: 2 Gy
Session Number: 11

## 2024-10-23 ENCOUNTER — Other Ambulatory Visit: Payer: Self-pay

## 2024-10-23 ENCOUNTER — Inpatient Hospital Stay: Admitting: Oncology

## 2024-10-23 ENCOUNTER — Inpatient Hospital Stay (HOSPITAL_BASED_OUTPATIENT_CLINIC_OR_DEPARTMENT_OTHER): Admitting: Oncology

## 2024-10-23 ENCOUNTER — Inpatient Hospital Stay

## 2024-10-23 ENCOUNTER — Encounter: Payer: Self-pay | Admitting: Oncology

## 2024-10-23 ENCOUNTER — Ambulatory Visit
Admission: RE | Admit: 2024-10-23 | Discharge: 2024-10-23 | Disposition: A | Discharge status: Home / Self Care | Source: Ambulatory Visit | Attending: Radiation Oncology | Admitting: Radiation Oncology

## 2024-10-23 VITALS — BP 111/69 | HR 89 | Temp 98.5°F | Resp 17 | Ht 73.0 in | Wt 234.0 lb

## 2024-10-23 DIAGNOSIS — C12 Malignant neoplasm of pyriform sinus: Secondary | ICD-10-CM

## 2024-10-23 DIAGNOSIS — G893 Neoplasm related pain (acute) (chronic): Secondary | ICD-10-CM

## 2024-10-23 LAB — CBC WITH DIFFERENTIAL (CANCER CENTER ONLY)
Abs Immature Granulocytes: 0.07 K/uL (ref 0.00–0.07)
Basophils Absolute: 0.1 K/uL (ref 0.0–0.1)
Basophils Relative: 1 %
Eosinophils Absolute: 0.1 K/uL (ref 0.0–0.5)
Eosinophils Relative: 1 %
HCT: 37.4 % — ABNORMAL LOW (ref 39.0–52.0)
Hemoglobin: 12.6 g/dL — ABNORMAL LOW (ref 13.0–17.0)
Immature Granulocytes: 1 %
Lymphocytes Relative: 13 %
Lymphs Abs: 1.1 K/uL (ref 0.7–4.0)
MCH: 30.3 pg (ref 26.0–34.0)
MCHC: 33.7 g/dL (ref 30.0–36.0)
MCV: 89.9 fL (ref 80.0–100.0)
Monocytes Absolute: 0.9 K/uL (ref 0.1–1.0)
Monocytes Relative: 10 %
Neutro Abs: 6.4 K/uL (ref 1.7–7.7)
Neutrophils Relative %: 74 %
Platelet Count: 228 K/uL (ref 150–400)
RBC: 4.16 MIL/uL — ABNORMAL LOW (ref 4.22–5.81)
RDW: 13.1 % (ref 11.5–15.5)
WBC Count: 8.5 K/uL (ref 4.0–10.5)
nRBC: 0 % (ref 0.0–0.2)

## 2024-10-23 LAB — RAD ONC ARIA SESSION SUMMARY
Course Elapsed Days: 20
Plan Fractions Treated to Date: 12
Plan Prescribed Dose Per Fraction: 2 Gy
Plan Total Fractions Prescribed: 35
Plan Total Prescribed Dose: 70 Gy
Reference Point Dosage Given to Date: 24 Gy
Reference Point Session Dosage Given: 2 Gy
Session Number: 12

## 2024-10-23 LAB — BASIC METABOLIC PANEL - CANCER CENTER ONLY
Anion gap: 6 (ref 5–15)
BUN: 17 mg/dL (ref 8–23)
CO2: 27 mmol/L (ref 22–32)
Calcium: 9.6 mg/dL (ref 8.9–10.3)
Chloride: 102 mmol/L (ref 98–111)
Creatinine: 0.97 mg/dL (ref 0.61–1.24)
GFR, Estimated: >60 mL/min (ref >60)
Glucose, Bld: 108 mg/dL — ABNORMAL HIGH (ref 70–99)
Potassium: 4.4 mmol/L (ref 3.5–5.1)
Sodium: 135 mmol/L (ref 135–145)

## 2024-10-23 LAB — MAGNESIUM: Magnesium: 2.1 mg/dL (ref 1.7–2.4)

## 2024-10-23 NOTE — Progress Notes (Signed)
 [facility]  ONCOLOGY CLINIC PROGRESS NOTE   Patient Care Team: Myra Geni ORN, FNP as PCP - General (Family Medicine) Court Dorn PARAS, MD as PCP - Cardiology (Cardiology) Malmfelt, Delon CROME, RN as Oncology Nurse Navigator Izell Domino, MD as Consulting Physician (Radiation Oncology) Autumn Millman, MD as Consulting Physician (Oncology) Tobie Eldora NOVAK, MD as Consulting Physician (Otolaryngology)  PATIENT NAME: Dave Williams   MR#: [id number] DOB: 09/04/1956  Date of visit: 10/23/2024   ASSESSMENT & PLAN:   Dave Williams is a 68 y.o. 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   Cancer of pyriform sinus (HCC) Please review oncology history for additional details and timeline of events.  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  Patient declined surgical option.  Hence plan made to proceed with concurrent chemoradiation.  Renal function is fluctuating and this needs to be monitored closely. The cancer has a high risk of recurrence, necessitating aggressive treatment.  We have discussed about role of cisplatin being a radiosensitizer in the treatment of head and neck cancer.  We have discussed about the curative intent of chemoradiation for this patient.     We have discussed about mechanism of action of cisplatin, adverse effects of cisplatin including but not limited to fatigue, nausea, vomiting, increased risk of infections, mucositis, ototoxicity, nephrotoxicity, peripheral neuropathy.  Patient understands that some of the side effects can be permanent and even potentially fatal. Patient is willing to proceed with weekly cisplatin.  -  Plan is to use cisplatin as the preferred chemotherapy agent, contingent on adequate kidney function due to nephrotoxicity.  Other options include weekly docetaxel or weekly carboplatin and paclitaxel, if kidney function does not permit cisplatin use.  - Patient declined feeding tube placement at this time.  - Encourage hydration to maintain kidney function, especially if not using a feeding tube.  Appropriate referrals were previously made for supportive care including physical therapy, speech pathology, nutrition.  Labs today reveal no dose-limiting toxicities.  Creatinine remains within normal limits.  We will proceed with cycle 4 of cisplatin at 40 mg/m dose tomorrow, 10/24/2024 and plan to continue treatments once a week.  RTC in 1 week for labs, follow-up and continuation of chemotherapy.  Cancer associated pain Experiencing intermittent pain managed with hydrocodone  7.5 mg and acetaminophen . Pain is variable, sometimes more like a headache. Prefers to avoid stronger medications to prevent dependency. Using baking soda and salt mouthwash for oral discomfort. - Continue hydrocodone  7.5 mg and acetaminophen  as needed for pain. Considering switching to oxycodone  to avoid acetaminophen -related liver issues, if he needs higher doses. - Use baking soda and salt mouthwash for oral discomfort. - Monitor pain levels and contact provider if pain becomes unmanageable.  Chemoradiation therapy side effects (dysgeusia, weight loss, constipation, mucositis) Reports dysgeusia, weight loss, constipation, and mucositis as side effects of chemoradiation. Dysgeusia described as metallic taste. Weight loss attributed to constipation, which has been managed with MiraLAX , dulcolax tablets, and suppositories. Mucositis present but improving, with less prominent symptoms. - Continue MiraLAX , dulcolax tablets, and suppositories as needed for constipation  I reviewed lab results and outside records for this  visit and discussed relevant results with the patient. Diagnosis, plan of care and  treatment options were also discussed in detail with the patient. Opportunity provided to ask questions and answers provided to his apparent satisfaction. Provided instructions to call our clinic with any problems, questions or concerns prior to return visit. I recommended to continue follow-up with PCP and sub-specialists. He verbalized understanding and agreed with the plan.   NCCN guidelines have been consulted in the planning of this patient's care.  I spent a total of 42 minutes during this encounter with the patient including review of chart and various tests results, discussions about plan of care and coordination of care plan.   Chinita Patten, MD  10/23/2024 4:08 PM  [facility] - A DEPT OF [facility] [address] [place] [address] Dept: [phone] Dept Fax: [phone]    CHIEF COMPLAINT/ REASON FOR VISIT:   Squamous cell carcinoma of the pyriform sinus, cT2, cN3, cM0, stage IV B.   Current Treatment:  Concurrent chemoradiation with weekly cisplatin started from 10/03/2024  INTERVAL HISTORY:    Discussed the use of AI scribe software for clinical note transcription with the patient, who gave verbal consent to proceed.  History of Present Illness  Dave Williams is a 68 year old male undergoing treatment for cancer who presents with side effects from radiation and chemotherapy.  He experiences a metallic taste in his mouth, which he attributes to radiation treatment. He has also experienced some weight loss. He had constipation last week, which resolved after using Mylanta and Dulcolax, and his bowel movements have returned to normal.  He is currently taking Norco 7.5 mg for pain management but sometimes opts for Tylenol . He is concerned about his past addictive behavior, specifically a history of cocaine use  over thirty years ago, and is cautious about using narcotics.  He is no longer taking steroids and has noticed significant improvement since stopping them. He is pleased with the change and is not taking any steroids at home.  Last Friday, he felt woozy due to chemotherapy and decided not to drive to his appointment, prioritizing safety. He lives alone and has no family nearby to assist with transportation, although he arranges for someone to drive him on chemotherapy days to avoid potential risks.  Swallowing is becoming more difficult, which he attributes to radiation treatment. The discomfort is deeper down in his throat, although the mouth itself is not significantly affected.   I have reviewed the past medical history, past surgical history, social history and family history with the patient and they are unchanged from previous note.  HISTORY OF PRESENT ILLNESS:   ONCOLOGY HISTORY:   He presented to his PCP with complains of a recurrent right neck mass.   Patient had US  neck on 06/05/24 showing enlarged lymph nodes in right superior cervical chain measuring 5.1 x 2.1 x 3.0 cm and 2.5 x 1.4 x 1.6 cm. CT soft tissue neck was performed on 06/16/24 showed a 2 cm supraglottic mass to the right of midline consistent with a malignancy and bulky malignant lymphadenopathy on the right at level 2 and level 3, with indistinct margins consistent with extra capsular spread. The upper level 2 node shows a diameter of 2.2 x 2.8 cm. The lower level 2 node measures approximately 28 x 17 mm. Level 3 lymph node similarly with indistinct margins shows diameter of 21 x 19 mm.    His PCP referred him to our clinic for further evaluation of neck lymphadenopathy.  He was seen in our rapid diagnostic clinic on 06/19/2024.  CBCD was unremarkable.  Flow cytometry of peripheral blood was also unremarkable.  ESR, CRP, LDH were within normal limits.  HIV, hepatitis panel testing was negative.   To further investigate his  symptoms, he was referred to Dr. Tobie on 06/26/24. At that time, patient recalled noticing a right neck mass last year that eventually resolved on its own. However, in January 2025, mass had reoccurred and had increased in size, causing  discomfort in the area.    He then underwent a Transnasal Fiberoptic Laryngoscopy indicating a hypopharynx neck mass involving the pyriform and likely arytenoid submucosally concerning for malignancy. Vocal folds were mobile bilaterally. No lesions on the free edge of the vocal folds. No airway compromise concern   Subsequently, he underwent a right neck mass biopsy on 07/24/24 which revealed invasive squamous cell carcinoma.         He presented for a follow up with Dr. Tobie on 07/31/24 to discuss further treatment options. Patient expressed no interest in surgical interventions. He would like to consider chemoradiation.    PET scan performed on 08/01/24 showed the right eccentric supraglottic mass has a maximum SUV of 6.9 along with hypermetabolic activity in all identified lymph nodes. Scan also noted several scattered pulmonary nodules with the largest measuring 0.4 x 0.5 cm with surveillance recommended as nodules are small in size to detect hypermetabolic activity.     Family history includes his father having esophageal cancer that metastasized to the spine, his mother having skin and anal cancer, and his sister having thyroid  cancer.    He had consultation with Dr. Izell on 08/06/2024.  Plan made for 7 weeks of radiation therapy and he was referred to us  for concurrent chemotherapy.   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  Patient declined surgical option.  Hence plan made to proceed with concurrent chemoradiation using weekly cisplatin.  Renal function is fluctuating and this needs to be monitored closely.   Further chart review shows patient established care with GI on 06/10/24 after having a positive cologuard  test. It was recommended patient have colonoscopy however he prefers to hold off while undergoing oncology work up. GI will reach out to patient in 2 months to schedule.   Started concurrent chemoradiation with weekly cisplatin from 10/03/2024.  Oncology History  Cancer of pyriform sinus (HCC)  08/07/2024 Initial Diagnosis   Cancer of pyriform sinus (HCC)   08/07/2024 Cancer Staging   Staging form: Pharynx - Hypopharynx, AJCC 8th Edition - Clinical stage from 08/07/2024: Stage IVB (cT2, cN3, cM0) - Signed by Izell Domino, MD on 08/07/2024 Stage prefix: Initial diagnosis   10/03/2024 -  Chemotherapy   Patient is on Treatment Plan : HEAD/NECK Cisplatin (40) q7d         REVIEW OF SYSTEMS:   Review of Systems - Oncology  All other pertinent systems were reviewed with the patient and are negative.  ALLERGIES: He is allergic to prednisone .  MEDICATIONS:  Current Outpatient Medications  Medication Sig Dispense Refill   acetaminophen  (TYLENOL ) 500 MG tablet Take 1,000 mg by mouth every 6 (six) hours as needed (pain.).     amLODipine  (NORVASC ) 10 MG tablet Take 1 tablet (10 mg total) by mouth daily. 90 tablet 2   aspirin  EC 81 MG tablet Take 1 tablet (81 mg total) by mouth daily. Swallow whole.     atorvastatin  (LIPITOR ) 80 MG tablet Take  1 tablet (80 mg total) by mouth daily with supper. 90 tablet 3   HYDROcodone -acetaminophen  (NORCO) 7.5-325 MG tablet Take 1 tablet by mouth every 6 (six) hours as needed for severe pain (pain score 7-10). 60 tablet 0   isosorbide  mononitrate (IMDUR ) 30 MG 24 hr tablet Take 1 tablet (30 mg total) by mouth daily. 90 tablet 1   lidocaine  (XYLOCAINE ) 2 % solution Patient: Mix 1part 2% viscous lidocaine , 1part H20. Swish & swallow 10mL of diluted mixture, 30min before meals and at bedtime, up to QID 200 mL 3   lidocaine -prilocaine  (EMLA ) cream Apply to affected area once 30 g 3   nitroGLYCERIN  (NITROSTAT ) 0.4 MG SL tablet Place 1 tablet (0.4 mg total) under  the tongue every 5 (five) minutes as needed for chest pain. DISSOLVE 1 TABLET UNDER THE TONGUE EVERY 5 MINUTES AS NEEDED FOR CHEST PAIN. DO NOT EXCEED A TOTAL OF 3 DOSES IN 15 MINUTES. 25 tablet 6   ondansetron  (ZOFRAN ) 8 MG tablet Take 1 tablet (8 mg total) by mouth every 8 (eight) hours as needed for nausea or vomiting. Start on the third day after cisplatin. 30 tablet 1   pantoprazole  (PROTONIX ) 40 MG tablet Take 1 tablet (40 mg total) by mouth daily. 30 tablet 0   prochlorperazine  (COMPAZINE ) 10 MG tablet Take 1 tablet (10 mg total) by mouth every 6 (six) hours as needed (Nausea or vomiting). 30 tablet 1   vitamin D3 (CHOLECALCIFEROL ) 25 MCG tablet Take 1 tablet (1,000 Units total) by mouth daily. 30 tablet 0   No current facility-administered medications for this visit.     VITALS:   Blood pressure 111/69, pulse 89, temperature 98.5 F (36.9 C), temperature source Temporal, resp. rate 17, height 6' 1 (1.854 m), weight 234 lb (106.1 kg), SpO2 97%.  Wt Readings from Last 3 Encounters:  10/23/24 234 lb (106.1 kg)  10/16/24 239 lb 6.4 oz (108.6 kg)  10/10/24 238 lb (108 kg)    Body mass index is 30.87 kg/m.    Onc Performance Status - 10/23/24 0950       ECOG Perf Status   ECOG Perf Status Restricted in physically strenuous activity but ambulatory and able to carry out work of a light or sedentary nature, e.g., light house work, office work      KPS SCALE   KPS % SCORE Able to carry on normal activity, minor s/s of disease           PHYSICAL EXAM:   Physical Exam Constitutional:      General: He is not in acute distress.    Appearance: Normal appearance.  HENT:     Head: Normocephalic and atraumatic.  Eyes:     Conjunctiva/sclera: Conjunctivae normal.  Cardiovascular:     Rate and Rhythm: Normal rate and regular rhythm.  Pulmonary:     Effort: Pulmonary effort is normal. No respiratory distress.  Chest:     Comments: Port-A-Cath in place without signs of  infection. Abdominal:     General: There is no distension.  Lymphadenopathy:     Cervical: Cervical adenopathy (Right Level 2/3 mass, fixed, ~ 5cm, less firm compared to prior) present.  Neurological:     General: No focal deficit present.     Mental Status: He is alert and oriented to person, place, and time.  Psychiatric:        Mood and Affect: Mood normal.        Behavior: Behavior normal.  LABORATORY DATA:   I have reviewed the data as listed.  Results for orders placed or performed in visit on 10/23/24  Rad Onc Aria Session Summary  Result Value Ref Range   Course ID [id number]    Course Start Date 09/23/2024    Session Number 12    Course First Treatment Date 10/03/2024  3:00 PM    Course Last Treatment Date 10/23/2024 10:43 AM    Course Elapsed Days 20    Reference Point ID HN DP    Reference Point Dosage Given to Date 24.00000012 Gy   Reference Point Session Dosage Given 2.00000001 Gy   Plan ID [id number]    Plan Fractions Treated to Date 12    Plan Total Fractions Prescribed 35    Plan Prescribed Dose Per Fraction 2 Gy   Plan Total Prescribed Dose 70.000000 Gy   Plan Primary Reference Point HN DP   Results for orders placed or performed in visit on 10/23/24  Magnesium   Result Value Ref Range   Magnesium  2.1 1.7 - 2.4 mg/dL  Basic Metabolic Panel - Cancer Center Only  Result Value Ref Range   Sodium 135 135 - 145 mmol/L   Potassium 4.4 3.5 - 5.1 mmol/L   Chloride 102 98 - 111 mmol/L   CO2 27 22 - 32 mmol/L   Glucose, Bld 108 (H) 70 - 99 mg/dL   BUN 17 8 - 23 mg/dL   Creatinine 9.02 9.38 - 1.24 mg/dL   Calcium  9.6 8.9 - 10.3 mg/dL   GFR, Estimated >39 >39 mL/min   Anion gap 6 5 - 15  CBC with Differential (Cancer Center Only)  Result Value Ref Range   WBC Count 8.5 4.0 - 10.5 K/uL   RBC 4.16 (L) 4.22 - 5.81 MIL/uL   Hemoglobin 12.6 (L) 13.0 - 17.0 g/dL   HCT 62.5 (L) 60.9 - 47.9 %   MCV 89.9 80.0 - 100.0 fL   MCH 30.3 26.0 - 34.0 pg   MCHC 33.7 30.0  - 36.0 g/dL   RDW 86.8 88.4 - 84.4 %   Platelet Count 228 150 - 400 K/uL   nRBC 0.0 0.0 - 0.2 %   Neutrophils Relative % 74 %   Neutro Abs 6.4 1.7 - 7.7 K/uL   Lymphocytes Relative 13 %   Lymphs Abs 1.1 0.7 - 4.0 K/uL   Monocytes Relative 10 %   Monocytes Absolute 0.9 0.1 - 1.0 K/uL   Eosinophils Relative 1 %   Eosinophils Absolute 0.1 0.0 - 0.5 K/uL   Basophils Relative 1 %   Basophils Absolute 0.1 0.0 - 0.1 K/uL   Immature Granulocytes 1 %   Abs Immature Granulocytes 0.07 0.00 - 0.07 K/uL      RADIOGRAPHIC STUDIES:  I have personally reviewed the radiological images as listed and agree with the findings in the report.  IR IMAGING GUIDED PORT INSERTION Result Date: 09/25/2024 CLINICAL DATA:  Squamous cell cancer of the hypopharynx. Chest port placement for therapy. EXAM: Chest port catheter placement TECHNIQUE: Procedure performed using fluoroscopy and ultrasound CONTRAST:  None RADIOPHARMACEUTICALS:  None FLUOROSCOPY: 1 mGy COMPARISON:  None FINDINGS: The patient was placed in supine position on the IR gantry and the right upper chest and neck were prepped and draped in the usual sterile fashion. The nurse administered intravenous fentanyl  and Versed  under my supervision and the nurse had no other injuries other than monitoring the patient and administering medications. I was present for the entire duration of procedure.  175 mcg intravenous fentanyl  and 3.5 mg intravenous Versed  were administered for a total sedation time of 40 minutes. Ultrasound guidance was used to investigate the right internal jugular vein which was anechoic and compressible indicating patency. The needle was then advanced from a scan negative through the soft tissue into the right internal jugular vein under ultrasound guidance. A final image was obtained and stored in the patient's permanent medical record. Access was then exchanged over a guidewire which was advanced under fluoroscopic guidance. The needle was  removed and replaced with a micropuncture sheath. Approximately 2 inches below the clavicle the port pocket was created with a subsequent incision. The catheter was then tunneled from the port pocket to the venotomy site overlying the right internal jugular vein. Access was then exchanged over an 035 guidewire for peel-away sheath which was advanced over the guidewire under fluoroscopic guidance. The catheter was then advanced through the peel-away sheath to the sinoatrial junction. Sheath was removed. The catheter was then cut at the port pocket and connected to chest port. The chest port was tested for function and finally function well. The chest port was then flushed with heparin  and a port pocket was closed with 4-0 suture. Final image was obtained demonstrating satisfactory position of chest port. The final count of all materials was satisfactory. IMPRESSION: 1. Satisfactory placement of right internal jugular vein single-lumen chest port. The catheter tip is at the cavoatrial junction. 2.  Okay to use and power inject chest port. Electronically Signed   By: Cordella Banner   On: 09/25/2024 15:39     CODE STATUS:  Code Status History     Date Active Date Inactive Code Status Order ID Comments User Context   09/25/2024 1523 09/26/2024 0524 Full Code [id number]  Banner Cordella LABOR, MD HOV   03/13/2024 1550 03/21/2024 1532 Full Code [id number]  Maurice Sharlet GORMAN DEVONNA Inpatient   03/04/2024 1247 03/13/2024 1548 Full Code [id number]  Viviane Lemond BRAVO, PA-C Inpatient   11/28/2023 1625 11/28/2023 2356 Full Code [id number]  Jordan, Peter M, MD Inpatient    Questions for Most Recent Historical Code Status (Order [id number])     Question Answer   By: Consent: discussion documented in EHR            No orders of the defined types were placed in this encounter.    Future Appointments  Date Time Provider Department Center  10/24/2024 10:15 AM [facility] LINAC 4 [facility] None  10/24/2024 10:30 AM  [facility] INFUSION [facility] None  10/24/2024  2:30 PM Neff, Barbara L, RD [facility] None  10/25/2024 10:15 AM [facility] LINAC 4 [facility] None  10/28/2024 10:15 AM [facility] LINAC 4 [facility] None  10/28/2024 10:40 AM LINAC-SQUIRE [facility] None  10/29/2024 10:15 AM [facility] LINAC 4 [facility] None  10/30/2024 10:15 AM [facility] LINAC 4 [facility] None  10/31/2024  8:45 AM [facility] FLUSH [facility] None  10/31/2024  9:30 AM Makesha Belitz, MD [facility] None  10/31/2024 10:15 AM [facility] LINAC 4 [facility] None  10/31/2024 10:30 AM [facility] INFUSION [facility] None  10/31/2024 12:00 PM Ivonne Harlene GORMAN, RD [facility] None  11/01/2024 10:15 AM [facility] LINAC 4 [facility] None  11/03/2024 10:15 AM [facility] LINAC 4 [facility] None  11/04/2024 10:15 AM [facility] LINAC 4 [facility] None  11/05/2024 10:15 AM [facility] LINAC 4 [facility] None  11/06/2024  9:30 AM [facility] FLUSH [facility] None  11/06/2024 10:15 AM [facility] LINAC 4 [facility] None  11/06/2024 10:30 AM [facility] INFUSION [facility] None  11/06/2024 11:15 AM Neff,  Barbara L, RD [facility] None  11/11/2024 10:15 AM [facility] LINAC 4 [facility] None  11/12/2024 10:15 AM [facility] LINAC 4 [facility] None  11/13/2024 10:15 AM [facility] LINAC 4 [facility] None  11/14/2024  9:00 AM [facility] FLUSH [facility] None  11/14/2024  9:30 AM Rakhi Romagnoli, MD [facility] None  11/14/2024 10:15 AM [facility] LINAC 4 [facility] None  11/14/2024 10:30 AM [facility] INFUSION [facility] None  11/14/2024 11:15 AM Ivonne Harlene RAMAN, RD [facility] None  11/15/2024 10:15 AM [facility] LINAC 4 [facility] None  11/18/2024 10:15 AM [facility] LINAC 4 [facility] None  11/19/2024 10:15 AM [facility] LINAC 4 [facility] None  11/20/2024 10:15 AM [facility] LINAC 4 [facility] None  11/21/2024 10:15 AM [facility] LINAC 4 [facility] None  11/22/2024 10:15 AM [facility] LINAC  4 [facility] None  11/25/2024 10:15 AM [facility] LINAC 4 [facility] None  11/26/2024 10:15 AM [facility] LINAC 4 [facility] None  12/19/2024 10:00 AM Breedlove Blue, Blaire L, PT [facility] None  01/22/2025 10:30 AM Fountain, Lum CROME, NP [facility] H&V     This document was completed utilizing speech recognition software. Grammatical errors, random word insertions, pronoun errors, and incomplete sentences are an occasional consequence of this system due to software limitations, ambient noise, and hardware issues. Any formal questions or concerns about the content, text or information contained within the body of this dictation should be directly addressed to the provider for clarification.

## 2024-10-23 NOTE — Assessment & Plan Note (Addendum)
 Please review oncology history for additional details and timeline of events.  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  Patient declined surgical option.  Hence plan made to proceed with concurrent chemoradiation.  Renal function is fluctuating and this needs to be monitored closely. The cancer has a high risk of recurrence, necessitating aggressive treatment.  We have discussed about role of cisplatin being a radiosensitizer in the treatment of head and neck cancer.  We have discussed about the curative intent of chemoradiation for this patient.     We have discussed about mechanism of action of cisplatin, adverse effects of cisplatin including but not limited to fatigue, nausea, vomiting, increased risk of infections, mucositis, ototoxicity, nephrotoxicity, peripheral neuropathy.  Patient understands that some of the side effects can be permanent and even potentially fatal. Patient is willing to proceed with weekly cisplatin.  - Plan is to use cisplatin as the preferred chemotherapy agent, contingent on adequate kidney function due to nephrotoxicity.  Other options include weekly docetaxel or weekly carboplatin and paclitaxel, if kidney function does not permit cisplatin use.  - Patient declined feeding tube placement at this time.  - Encourage hydration to maintain kidney function, especially if not using a feeding tube.  Appropriate referrals were previously made for supportive care including physical therapy, speech pathology, nutrition.  Labs today reveal no dose-limiting toxicities.  Creatinine remains within normal limits.  We will proceed with cycle 4 of cisplatin at 40 mg/m dose tomorrow, 10/24/2024 and plan to continue treatments once a week.  RTC in 1 week for labs, follow-up and continuation of chemotherapy.

## 2024-10-23 NOTE — Assessment & Plan Note (Signed)
 Experiencing intermittent pain managed with hydrocodone  7.5 mg and acetaminophen . Pain is variable, sometimes more like a headache. Prefers to avoid stronger medications to prevent dependency. Using baking soda and salt mouthwash for oral discomfort. - Continue hydrocodone  7.5 mg and acetaminophen  as needed for pain. Considering switching to oxycodone  to avoid acetaminophen -related liver issues, if he needs higher doses. - Use baking soda and salt mouthwash for oral discomfort. - Monitor pain levels and contact provider if pain becomes unmanageable.

## 2024-10-24 ENCOUNTER — Ambulatory Visit
Admission: RE | Admit: 2024-10-24 | Discharge: 2024-10-24 | Disposition: A | Discharge status: Home / Self Care | Source: Ambulatory Visit | Attending: Radiation Oncology | Admitting: Radiation Oncology

## 2024-10-24 ENCOUNTER — Inpatient Hospital Stay: Admitting: Nutrition

## 2024-10-24 ENCOUNTER — Inpatient Hospital Stay

## 2024-10-24 ENCOUNTER — Other Ambulatory Visit: Payer: Self-pay

## 2024-10-24 ENCOUNTER — Inpatient Hospital Stay: Admitting: Oncology

## 2024-10-24 VITALS — BP 140/77 | HR 78 | Temp 98.5°F | Resp 16

## 2024-10-24 DIAGNOSIS — C12 Malignant neoplasm of pyriform sinus: Secondary | ICD-10-CM

## 2024-10-24 LAB — RAD ONC ARIA SESSION SUMMARY
Course Elapsed Days: 21
Plan Fractions Treated to Date: 13
Plan Prescribed Dose Per Fraction: 2 Gy
Plan Total Fractions Prescribed: 35
Plan Total Prescribed Dose: 70 Gy
Reference Point Dosage Given to Date: 26 Gy
Reference Point Session Dosage Given: 2 Gy
Session Number: 13

## 2024-10-24 MED ORDER — MAGNESIUM SULFATE 2 GM/50ML IV SOLN
2.0000 g | Freq: Once | INTRAVENOUS | Status: AC
  Administered 2024-10-24: 2 g via INTRAVENOUS
  Filled 2024-10-24: qty 50

## 2024-10-24 MED ORDER — PALONOSETRON HCL INJECTION 0.25 MG/5ML
0.2500 mg | Freq: Once | INTRAVENOUS | Status: AC
  Administered 2024-10-24: 0.25 mg via INTRAVENOUS
  Filled 2024-10-24: qty 5

## 2024-10-24 MED ORDER — POTASSIUM CHLORIDE IN NACL 20-0.9 MEQ/L-% IV SOLN
Freq: Once | INTRAVENOUS | Status: AC
  Filled 2024-10-24: qty 1000

## 2024-10-24 MED ORDER — SODIUM CHLORIDE 0.9% FLUSH: 10.0000 mL | INTRAVENOUS | Status: DC | PRN

## 2024-10-24 MED ORDER — SODIUM CHLORIDE 0.9 % IV SOLN: INTRAVENOUS | Status: DC

## 2024-10-24 MED ORDER — SODIUM CHLORIDE 0.9 % IV SOLN
150.0000 mg | Freq: Once | INTRAVENOUS | Status: AC
  Administered 2024-10-24: 150 mg via INTRAVENOUS
  Filled 2024-10-24: qty 150

## 2024-10-24 MED ORDER — SODIUM CHLORIDE 0.9 % IV SOLN
40.0000 mg/m2 | Freq: Once | INTRAVENOUS | Status: AC
  Administered 2024-10-24: 100 mg via INTRAVENOUS
  Filled 2024-10-24: qty 100

## 2024-10-24 NOTE — Progress Notes (Signed)
 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  Patient receives concurrent chemoradiation therapy with weekly cisplatin.  Final radiation therapy is scheduled for December 16.  He is followed by Dr. Autumn and Dr. Izell.  Patient does not have a feeding tube.  Weight:  234 pounds November 13 239 pounds 6.4 ounces November 5 238 pounds 12.8 ounces October 29  Labs include glucose 108  Patient appears to be in good spirits.  His metallic taste continues.  He has not tried baking soda and salt water gargle.  He does not use lidocaine .  Reports weight loss is a result of recent relief from constipation.  He denies nausea and vomiting.  Nutrition diagnosis:  Food and nutrition related knowledge deficit, improved.  Intervention: Provided additional education on managing taste alterations. Recommended patient try baking soda and salt water gargles prior to eating. Try to consume smaller more frequent meals and snacks. Continue Carnation breakfast essentials mixed with whole milk between meals 2 times daily. Bowel regimen.  Monitoring, evaluation, goals: Patient will tolerate adequate calories and protein to minimize further weight loss.  Next visit: Thursday, November 20 with Elvie.  **Disclaimer: This note was dictated with voice recognition software. Similar sounding words can inadvertently be transcribed and this note may contain transcription errors which may not have been corrected upon publication of note.**

## 2024-10-24 NOTE — Patient Instructions (Signed)

## 2024-10-25 ENCOUNTER — Other Ambulatory Visit: Payer: Self-pay

## 2024-10-25 ENCOUNTER — Ambulatory Visit
Admission: RE | Admit: 2024-10-25 | Discharge: 2024-10-25 | Disposition: A | Discharge status: Home / Self Care | Source: Ambulatory Visit | Attending: Radiation Oncology | Admitting: Radiation Oncology

## 2024-10-25 LAB — RAD ONC ARIA SESSION SUMMARY
Course Elapsed Days: 22
Plan Fractions Treated to Date: 14
Plan Prescribed Dose Per Fraction: 2 Gy
Plan Total Fractions Prescribed: 35
Plan Total Prescribed Dose: 70 Gy
Reference Point Dosage Given to Date: 28 Gy
Reference Point Session Dosage Given: 2 Gy
Session Number: 14

## 2024-10-28 ENCOUNTER — Ambulatory Visit
Admission: RE | Admit: 2024-10-28 | Discharge: 2024-10-28 | Disposition: A | Discharge status: Home / Self Care | Source: Ambulatory Visit | Attending: Radiation Oncology | Admitting: Radiation Oncology

## 2024-10-28 ENCOUNTER — Other Ambulatory Visit: Payer: Self-pay

## 2024-10-28 LAB — RAD ONC ARIA SESSION SUMMARY
Course Elapsed Days: 25
Plan Fractions Treated to Date: 15
Plan Prescribed Dose Per Fraction: 2 Gy
Plan Total Fractions Prescribed: 35
Plan Total Prescribed Dose: 70 Gy
Reference Point Dosage Given to Date: 30 Gy
Reference Point Session Dosage Given: 2 Gy
Session Number: 15

## 2024-10-29 ENCOUNTER — Ambulatory Visit
Admission: RE | Admit: 2024-10-29 | Discharge: 2024-10-29 | Disposition: A | Discharge status: Home / Self Care | Source: Ambulatory Visit | Attending: Radiation Oncology | Admitting: Radiation Oncology

## 2024-10-29 ENCOUNTER — Other Ambulatory Visit: Payer: Self-pay

## 2024-10-29 LAB — RAD ONC ARIA SESSION SUMMARY
Course Elapsed Days: 26
Plan Fractions Treated to Date: 16
Plan Prescribed Dose Per Fraction: 2 Gy
Plan Total Fractions Prescribed: 35
Plan Total Prescribed Dose: 70 Gy
Reference Point Dosage Given to Date: 32 Gy
Reference Point Session Dosage Given: 2 Gy
Session Number: 16

## 2024-10-30 ENCOUNTER — Ambulatory Visit

## 2024-10-30 ENCOUNTER — Telehealth: Payer: Self-pay | Admitting: Radiation Oncology

## 2024-10-30 MED FILL — Fosaprepitant Dimeglumine For IV Infusion 150 MG (Base Eq): INTRAVENOUS | Qty: 5 | Status: AC

## 2024-10-30 NOTE — Telephone Encounter (Signed)
 Pt called to advise he would not be coming in for tx today. He states he does not feel well and is not confident he can drive himself. He is not able to ask anyone to bring him in today. He is hoping to be back in tomorrow. L4 advised via email.

## 2024-10-31 ENCOUNTER — Encounter: Payer: Self-pay | Admitting: Oncology

## 2024-10-31 ENCOUNTER — Inpatient Hospital Stay: Admitting: Dietician

## 2024-10-31 ENCOUNTER — Other Ambulatory Visit (HOSPITAL_COMMUNITY): Payer: Self-pay

## 2024-10-31 ENCOUNTER — Inpatient Hospital Stay

## 2024-10-31 ENCOUNTER — Other Ambulatory Visit: Payer: Self-pay

## 2024-10-31 ENCOUNTER — Ambulatory Visit
Admission: RE | Admit: 2024-10-31 | Discharge: 2024-10-31 | Disposition: A | Discharge status: Home / Self Care | Source: Ambulatory Visit | Attending: Radiation Oncology | Admitting: Radiation Oncology

## 2024-10-31 ENCOUNTER — Encounter (HOSPITAL_COMMUNITY): Payer: Self-pay

## 2024-10-31 ENCOUNTER — Ambulatory Visit

## 2024-10-31 ENCOUNTER — Inpatient Hospital Stay (HOSPITAL_BASED_OUTPATIENT_CLINIC_OR_DEPARTMENT_OTHER): Admitting: Oncology

## 2024-10-31 VITALS — BP 108/64 | HR 83 | Temp 97.7°F | Resp 17 | Ht 73.0 in | Wt 227.0 lb

## 2024-10-31 DIAGNOSIS — G893 Neoplasm related pain (acute) (chronic): Secondary | ICD-10-CM

## 2024-10-31 DIAGNOSIS — C12 Malignant neoplasm of pyriform sinus: Secondary | ICD-10-CM

## 2024-10-31 DIAGNOSIS — F5109 Other insomnia not due to a substance or known physiological condition: Secondary | ICD-10-CM

## 2024-10-31 LAB — CBC WITH DIFFERENTIAL (CANCER CENTER ONLY)
Abs Immature Granulocytes: 0.06 K/uL (ref 0.00–0.07)
Basophils Absolute: 0 K/uL (ref 0.0–0.1)
Basophils Relative: 0 %
Eosinophils Absolute: 0 K/uL (ref 0.0–0.5)
Eosinophils Relative: 0 %
HCT: 35.5 % — ABNORMAL LOW (ref 39.0–52.0)
Hemoglobin: 12.3 g/dL — ABNORMAL LOW (ref 13.0–17.0)
Immature Granulocytes: 1 %
Lymphocytes Relative: 12 %
Lymphs Abs: 0.9 K/uL (ref 0.7–4.0)
MCH: 30.5 pg (ref 26.0–34.0)
MCHC: 34.6 g/dL (ref 30.0–36.0)
MCV: 88.1 fL (ref 80.0–100.0)
Monocytes Absolute: 0.8 K/uL (ref 0.1–1.0)
Monocytes Relative: 10 %
Neutro Abs: 5.8 K/uL (ref 1.7–7.7)
Neutrophils Relative %: 77 %
Platelet Count: 174 K/uL (ref 150–400)
RBC: 4.03 MIL/uL — ABNORMAL LOW (ref 4.22–5.81)
RDW: 13.3 % (ref 11.5–15.5)
WBC Count: 7.5 K/uL (ref 4.0–10.5)
nRBC: 0 % (ref 0.0–0.2)

## 2024-10-31 LAB — MAGNESIUM: Magnesium: 2.2 mg/dL (ref 1.7–2.4)

## 2024-10-31 LAB — RAD ONC ARIA SESSION SUMMARY
Course Elapsed Days: 28
Plan Fractions Treated to Date: 17
Plan Prescribed Dose Per Fraction: 2 Gy
Plan Total Fractions Prescribed: 35
Plan Total Prescribed Dose: 70 Gy
Reference Point Dosage Given to Date: 34 Gy
Reference Point Session Dosage Given: 2 Gy
Session Number: 17

## 2024-10-31 LAB — BASIC METABOLIC PANEL - CANCER CENTER ONLY
Anion gap: 10 (ref 5–15)
BUN: 18 mg/dL (ref 8–23)
CO2: 26 mmol/L (ref 22–32)
Calcium: 9.6 mg/dL (ref 8.9–10.3)
Chloride: 99 mmol/L (ref 98–111)
Creatinine: 0.96 mg/dL (ref 0.61–1.24)
GFR, Estimated: >60 mL/min (ref >60)
Glucose, Bld: 105 mg/dL — ABNORMAL HIGH (ref 70–99)
Potassium: 4.3 mmol/L (ref 3.5–5.1)
Sodium: 135 mmol/L (ref 135–145)

## 2024-10-31 MED ORDER — POTASSIUM CHLORIDE IN NACL 20-0.9 MEQ/L-% IV SOLN
Freq: Once | INTRAVENOUS | Status: AC
  Filled 2024-10-31: qty 1000

## 2024-10-31 MED ORDER — SODIUM CHLORIDE 0.9 % IV SOLN: INTRAVENOUS | Status: DC

## 2024-10-31 MED ORDER — SODIUM CHLORIDE 0.9 % IV SOLN
40.0000 mg/m2 | Freq: Once | INTRAVENOUS | Status: AC
  Administered 2024-10-31: 100 mg via INTRAVENOUS
  Filled 2024-10-31: qty 100

## 2024-10-31 MED ORDER — MAGNESIUM SULFATE 2 GM/50ML IV SOLN
2.0000 g | Freq: Once | INTRAVENOUS | Status: AC
  Administered 2024-10-31: 2 g via INTRAVENOUS
  Filled 2024-10-31: qty 50

## 2024-10-31 MED ORDER — ALPRAZOLAM 0.5 MG PO TABS
0.5000 mg | ORAL_TABLET | Freq: Every evening | ORAL | 0 refills | Status: DC | PRN
  Filled 2024-10-31: qty 30, 30d supply, fill #0

## 2024-10-31 MED ORDER — SODIUM CHLORIDE 0.9% FLUSH: 10.0000 mL | INTRAVENOUS | Status: DC | PRN

## 2024-10-31 MED ORDER — PALONOSETRON HCL INJECTION 0.25 MG/5ML
0.2500 mg | Freq: Once | INTRAVENOUS | Status: AC
  Administered 2024-10-31: 0.25 mg via INTRAVENOUS
  Filled 2024-10-31: qty 5

## 2024-10-31 MED ORDER — SODIUM CHLORIDE 0.9 % IV SOLN
150.0000 mg | Freq: Once | INTRAVENOUS | Status: AC
  Administered 2024-10-31: 150 mg via INTRAVENOUS
  Filled 2024-10-31: qty 150

## 2024-10-31 NOTE — Patient Instructions (Signed)
 [facility] - A DEPT OF [facility]  Discharge Instructions: Thank you for choosing [facility] to provide your oncology and hematology care.   If you have a lab appointment with the Cancer Center, please go directly to the Cancer Center and check in at the registration area.   Wear comfortable clothing and clothing appropriate for easy access to any Portacath or PICC line.   We strive to give you quality time with your provider. You may need to reschedule your appointment if you arrive late (15 or more minutes).  Arriving late affects you and other patients whose appointments are after yours.  Also, if you miss three or more appointments without notifying the office, you may be dismissed from the clinic at the provider's discretion.      For prescription refill requests, have your pharmacy contact our office and allow 72 hours for refills to be completed.    Today you received the following chemotherapy and/or immunotherapy agents cisplatin      To help prevent nausea and vomiting after your treatment, we encourage you to take your nausea medication as directed.  BELOW ARE SYMPTOMS THAT SHOULD BE REPORTED IMMEDIATELY: *FEVER GREATER THAN 100.4 F (38 C) OR HIGHER *CHILLS OR SWEATING *NAUSEA AND VOMITING THAT IS NOT CONTROLLED WITH YOUR NAUSEA MEDICATION *UNUSUAL SHORTNESS OF BREATH *UNUSUAL BRUISING OR BLEEDING *URINARY PROBLEMS (pain or burning when urinating, or frequent urination) *BOWEL PROBLEMS (unusual diarrhea, constipation, pain near the anus) TENDERNESS IN MOUTH AND THROAT WITH OR WITHOUT PRESENCE OF ULCERS (sore throat, sores in mouth, or a toothache) UNUSUAL RASH, SWELLING OR PAIN  UNUSUAL VAGINAL DISCHARGE OR ITCHING   Items with * indicate a potential emergency and should be followed up as soon as possible or go to the Emergency Department if any problems should occur.  Please show the CHEMOTHERAPY ALERT CARD or IMMUNOTHERAPY  ALERT CARD at check-in to the Emergency Department and triage nurse.  Should you have questions after your visit or need to cancel or reschedule your appointment, please contact [facility] - A DEPT OF [facility]  Dept: [phone]  and follow the prompts.  Office hours are 8:00 a.m. to 4:30 p.m. Monday - Friday. Please note that voicemails left after 4:00 p.m. may not be returned until the following business day.  We are closed weekends and major holidays. You have access to a nurse at all times for urgent questions. Please call the main number to the clinic Dept: [phone] and follow the prompts.   For any non-urgent questions, you may also contact your provider using MyChart. We now offer e-Visits for anyone 66 and older to request care online for non-urgent symptoms. For details visit [email or web address].   Also download the MyChart app! Go to the app store, search "MyChart", open the app, select [place], and log in with your MyChart username and password.

## 2024-10-31 NOTE — Assessment & Plan Note (Signed)
 Experiencing insomnia with difficulty maintaining sleep, averaging 2.5 to 3 hours per night. Previous use of Tylenol  PM and Advil PM was ineffective. Melatonin was ineffective and not preferred. Discussed potential use of Xanax for short-term management, acknowledging its habit-forming nature. He understands the risks and is willing to try Xanax to improve sleep quality. - Prescribed Xanax 0.5 mg for short-term use to aid sleep. - Sent prescription to in-house pharmacy for immediate availability.

## 2024-10-31 NOTE — Assessment & Plan Note (Signed)
 Experiencing intermittent pain managed with hydrocodone  7.5 mg and acetaminophen . Pain is variable, sometimes more like a headache. Prefers to avoid stronger medications to prevent dependency. Using baking soda and salt mouthwash for oral discomfort. - Continue hydrocodone  7.5 mg and acetaminophen  as needed for pain. Considering switching to oxycodone  to avoid acetaminophen -related liver issues, if he needs higher doses. - Use baking soda and salt mouthwash for oral discomfort. - Monitor pain levels and contact provider if pain becomes unmanageable.

## 2024-10-31 NOTE — Progress Notes (Signed)
 Pt declined to finish the last of NaCl with Kcl 20meq. He reported that his ride was waiting and could not stay past 1630. Dr. Autumn notified.

## 2024-10-31 NOTE — Progress Notes (Signed)
 [facility]  ONCOLOGY CLINIC PROGRESS NOTE   Patient Care Team: Myra Geni ORN, FNP as PCP - General (Family Medicine) Court Dorn PARAS, MD as PCP - Cardiology (Cardiology) Malmfelt, Delon CROME, RN as Oncology Nurse Navigator Izell Domino, MD as Consulting Physician (Radiation Oncology) Autumn Millman, MD as Consulting Physician (Oncology) Tobie Eldora NOVAK, MD as Consulting Physician (Otolaryngology)  PATIENT NAME: Dave Williams   MR#: [id number] DOB: Jan 21, 1956  Date of visit: 10/31/2024   ASSESSMENT & PLAN:   Dave Williams is a 68 y.o. 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   Cancer of pyriform sinus (HCC) Please review oncology history for additional details and timeline of events.  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  Patient declined surgical option.  Hence plan made to proceed with concurrent chemoradiation.  Renal function is fluctuating and this needs to be monitored closely. The cancer has a high risk of recurrence, necessitating aggressive treatment.  We have discussed about role of cisplatin being a radiosensitizer in the treatment of head and neck cancer.  We have discussed about the curative intent of chemoradiation for this patient.  Patient is willing to proceed with weekly cisplatin.  He has been tolerating chemotherapy reasonably well.  Lately he has been having altered taste, dry mouth, which is making him difficult to eat and he has lost 7 pounds in the last week.  Previously declined feeding tube placement but is now willing to proceed with this.  Will arrange this with IR next week.  Due for cycle 5 of  chemotherapy with cisplatin today.  Labs reveal no dose-limiting toxicities.  Will proceed with cycle 5 today.  - Encourage hydration to maintain kidney function, especially if not using a feeding tube.  Appropriate referrals were previously made for supportive care including physical therapy, speech pathology, nutrition.  RTC in 1 week for labs, and continuation of chemotherapy.  RTC in 2 weeks for labs, office visit and chemotherapy.  Cancer associated pain Experiencing intermittent pain managed with hydrocodone  7.5 mg and acetaminophen . Pain is variable, sometimes more like a headache. Prefers to avoid stronger medications to prevent dependency. Using baking soda and salt mouthwash for oral discomfort. - Continue hydrocodone  7.5 mg and acetaminophen  as needed for pain. Considering switching to oxycodone  to avoid acetaminophen -related liver issues, if he needs higher doses. - Use baking soda and salt mouthwash for oral discomfort. - Monitor pain levels and contact provider if pain becomes unmanageable.  Situational insomnia Experiencing insomnia with difficulty maintaining sleep, averaging 2.5 to 3 hours per night. Previous use of Tylenol  PM and Advil PM was ineffective. Melatonin was ineffective and not preferred. Discussed potential use of Xanax for short-term management, acknowledging its habit-forming nature. He understands the risks and is willing to try Xanax to improve sleep quality. - Prescribed Xanax 0.5 mg for short-term use to aid sleep. - Sent prescription to in-house pharmacy for immediate availability.  I reviewed lab results and outside records for this visit and discussed relevant results with the patient. Diagnosis, plan of care and treatment options were also discussed in detail with the patient. Opportunity provided to ask questions and answers provided to his apparent satisfaction. Provided instructions to call our clinic  with any problems, questions or concerns prior to  return visit. I recommended to continue follow-up with PCP and sub-specialists. He verbalized understanding and agreed with the plan.   NCCN guidelines have been consulted in the planning of this patient's care.  I spent a total of 42 minutes during this encounter with the patient including review of chart and various tests results, discussions about plan of care and coordination of care plan.   Chinita Patten, MD  10/31/2024 5:19 PM  [facility] - [facility] DE[facility] [address] [place] [address] Dept: [phone] Dept Fax: [phone]    CHIEF COMPLAINT/ REASON FOR VISIT:   Squamous cell carcinoma of the pyriform sinus, cT2, cN3, cM0, stage IV B.   Current Treatment:  Concurrent chemoradiation with weekly cisplatin started from 10/03/2024  INTERVAL HISTORY:    Discussed the use of AI scribe software for clinical note transcription with the patient, who gave verbal consent to proceed.  History of Present Illness Dave Williams is a 68 year old male undergoing chemotherapy and radiation therapy who presents with weight loss and sleep disturbances.  He is experiencing significant weight loss, which he attributes to the combined effects of chemotherapy and radiation therapy. Food tastes like 'slime or metal,' making it difficult to eat, despite efforts to consume instant breakfasts. He anticipated losing 20 to 25 pounds, having gained 30 pounds prior to starting treatment. He is staying hydrated, drinking at least four to five bottles of water daily.  He is experiencing sleep disturbances, getting only two and a half to three hours of sleep at night. He has tried Tylenol  PM and Advil PM without success. He needs rest, as he sleeps for two hours and then remains awake for four hours. He has a history of sleeping through the night prior to his heart surgery and current treatments.  He discusses  transportation challenges, particularly on chemotherapy days, as he does not feel safe driving due to potential medical emergencies. He missed an appointment because he could not find someone to drive him and did not want to risk driving himself.  His constipation has resolved and is no longer an issue.   I have reviewed the past medical history, past surgical history, social history and family history with the patient and they are unchanged from previous note.  HISTORY OF PRESENT ILLNESS:   ONCOLOGY HISTORY:   He presented to his PCP with complains of a recurrent right neck mass.   Patient had US  neck on 06/05/24 showing enlarged lymph nodes in right superior cervical chain measuring 5.1 x 2.1 x 3.0 cm and 2.5 x 1.4 x 1.6 cm. CT soft tissue neck was performed on 06/16/24 showed a 2 cm supraglottic mass to the right of midline consistent with a malignancy and bulky malignant lymphadenopathy on the right at level 2 and level 3, with indistinct margins consistent with extra capsular spread. The upper level 2 node shows a diameter of 2.2 x 2.8 cm. The lower level 2 node measures approximately 28 x 17 mm. Level 3 lymph node similarly with indistinct margins shows diameter of 21 x 19 mm.    His PCP referred him to our clinic for further evaluation of neck lymphadenopathy.  He was seen in our rapid diagnostic clinic on 06/19/2024.  CBCD was unremarkable.  Flow cytometry of peripheral blood was also unremarkable.  ESR, CRP, LDH were within normal limits.  HIV,  hepatitis panel testing was negative.   To further investigate his symptoms, he was referred to Dr. Tobie on 06/26/24. At that time, patient recalled noticing a right neck mass last year that eventually resolved on its own. However, in January 2025, mass had reoccurred and had increased in size, causing  discomfort in the area.    He then underwent a Transnasal Fiberoptic Laryngoscopy indicating a hypopharynx neck mass involving the pyriform and  likely arytenoid submucosally concerning for malignancy. Vocal folds were mobile bilaterally. No lesions on the free edge of the vocal folds. No airway compromise concern   Subsequently, he underwent a right neck mass biopsy on 07/24/24 which revealed invasive squamous cell carcinoma.         He presented for a follow up with Dr. Tobie on 07/31/24 to discuss further treatment options. Patient expressed no interest in surgical interventions. He would like to consider chemoradiation.    PET scan performed on 08/01/24 showed the right eccentric supraglottic mass has a maximum SUV of 6.9 along with hypermetabolic activity in all identified lymph nodes. Scan also noted several scattered pulmonary nodules with the largest measuring 0.4 x 0.5 cm with surveillance recommended as nodules are small in size to detect hypermetabolic activity.     Family history includes his father having esophageal cancer that metastasized to the spine, his mother having skin and anal cancer, and his sister having thyroid  cancer.    He had consultation with Dr. Izell on 08/06/2024.  Plan made for 7 weeks of radiation therapy and he was referred to us  for concurrent chemotherapy.   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  Patient declined surgical option.  Hence plan made to proceed with concurrent chemoradiation using weekly cisplatin.  Renal function is fluctuating and this needs to be monitored closely.   Further chart review shows patient established care with GI on 06/10/24 after having a positive cologuard test. It was recommended patient have colonoscopy however he prefers to hold off while undergoing oncology work up. GI will reach out to patient in 2 months to schedule.   Started concurrent chemoradiation with weekly cisplatin from 10/03/2024.  Oncology History  Cancer of pyriform sinus (HCC)  08/07/2024 Initial Diagnosis   Cancer of pyriform sinus (HCC)   08/07/2024 Cancer  Staging   Staging form: Pharynx - Hypopharynx, AJCC 8th Edition - Clinical stage from 08/07/2024: Stage IVB (cT2, cN3, cM0) - Signed by Izell Domino, MD on 08/07/2024 Stage prefix: Initial diagnosis   10/03/2024 -  Chemotherapy   Patient is on Treatment Plan : HEAD/NECK Cisplatin (40) q7d         REVIEW OF SYSTEMS:   Review of Systems - Oncology  All other pertinent systems were reviewed with the patient and are negative.  ALLERGIES: He is allergic to prednisone .  MEDICATIONS:  Current Outpatient Medications  Medication Sig Dispense Refill   acetaminophen  (TYLENOL ) 500 MG tablet Take 1,000 mg by mouth every 6 (six) hours as needed (pain.).     ALPRAZolam (XANAX) 0.5 MG tablet Take 1 tablet (0.5 mg total) by mouth at bedtime as needed for anxiety or sleep. 30 tablet 0   amLODipine  (NORVASC ) 10 MG tablet Take 1 tablet (10 mg total) by mouth daily. 90 tablet 2   aspirin  EC 81 MG tablet Take 1 tablet (81 mg total) by mouth daily. Swallow whole.     atorvastatin  (LIPITOR ) 80 MG tablet Take 1 tablet (80 mg total) by mouth  daily with supper. 90 tablet 3   HYDROcodone -acetaminophen  (NORCO) 7.5-325 MG tablet Take 1 tablet by mouth every 6 (six) hours as needed for severe pain (pain score 7-10). 60 tablet 0   isosorbide  mononitrate (IMDUR ) 30 MG 24 hr tablet Take 1 tablet (30 mg total) by mouth daily. 90 tablet 1   lidocaine  (XYLOCAINE ) 2 % solution Patient: Mix 1part 2% viscous lidocaine , 1part H20. Swish & swallow 10mL of diluted mixture, 30min before meals and at bedtime, up to QID 200 mL 3   lidocaine -prilocaine  (EMLA ) cream Apply to affected area once 30 g 3   nitroGLYCERIN  (NITROSTAT ) 0.4 MG SL tablet Place 1 tablet (0.4 mg total) under the tongue every 5 (five) minutes as needed for chest pain. DISSOLVE 1 TABLET UNDER THE TONGUE EVERY 5 MINUTES AS NEEDED FOR CHEST PAIN. DO NOT EXCEED A TOTAL OF 3 DOSES IN 15 MINUTES. 25 tablet 6   ondansetron  (ZOFRAN ) 8 MG tablet Take 1 tablet (8 mg  total) by mouth every 8 (eight) hours as needed for nausea or vomiting. Start on the third day after cisplatin . 30 tablet 1   pantoprazole  (PROTONIX ) 40 MG tablet Take 1 tablet (40 mg total) by mouth daily. 30 tablet 0   prochlorperazine  (COMPAZINE ) 10 MG tablet Take 1 tablet (10 mg total) by mouth every 6 (six) hours as needed (Nausea or vomiting). 30 tablet 1   vitamin D3 (CHOLECALCIFEROL ) 25 MCG tablet Take 1 tablet (1,000 Units total) by mouth daily. 30 tablet 0   No current facility-administered medications for this visit.     VITALS:   Blood pressure 108/64, pulse 83, temperature 97.7 F (36.5 C), temperature source Temporal, resp. rate 17, height 6' 1 (1.854 m), weight 227 lb (103 kg), SpO2 98%.  Wt Readings from Last 3 Encounters:  10/31/24 227 lb (103 kg)  10/23/24 234 lb (106.1 kg)  10/16/24 239 lb 6.4 oz (108.6 kg)    Body mass index is 29.95 kg/m.    Onc Performance Status - 10/31/24 0936       ECOG Perf Status   ECOG Perf Status Restricted in physically strenuous activity but ambulatory and able to carry out work of a light or sedentary nature, e.g., light house work, office work      KPS SCALE   KPS % SCORE Cares for self, unable to carry on normal activity or to do active work           PHYSICAL EXAM:   Physical Exam Constitutional:      General: He is not in acute distress.    Appearance: Normal appearance.  HENT:     Head: Normocephalic and atraumatic.  Eyes:     Conjunctiva/sclera: Conjunctivae normal.  Cardiovascular:     Rate and Rhythm: Normal rate and regular rhythm.  Pulmonary:     Effort: Pulmonary effort is normal. No respiratory distress.  Chest:     Comments: Port-A-Cath in place without signs of infection. Abdominal:     General: There is no distension.  Lymphadenopathy:     Cervical: Cervical adenopathy (Right Level 2/3 mass, fixed, ~ 5cm, less firm compared to prior) present.  Neurological:     General: No focal deficit present.      Mental Status: He is alert and oriented to person, place, and time.  Psychiatric:        Mood and Affect: Mood normal.        Behavior: Behavior normal.      LABORATORY DATA:  I have reviewed the data as listed.  Results for orders placed or performed in visit on 10/31/24  Rad Onc Aria Session Summary  Result Value Ref Range   Course ID [id number]    Course Start Date 09/23/2024    Session Number 17    Course First Treatment Date 10/03/2024  3:00 PM    Course Last Treatment Date 10/31/2024 10:27 AM    Course Elapsed Days 28    Reference Point ID HN DP    Reference Point Dosage Given to Date 34.00000017 Gy   Reference Point Session Dosage Given 2.00000001 Gy   Plan ID [id number]    Plan Fractions Treated to Date 17    Plan Total Fractions Prescribed 35    Plan Prescribed Dose Per Fraction 2 Gy   Plan Total Prescribed Dose 70.000000 Gy   Plan Primary Reference Point HN DP   Results for orders placed or performed in visit on 10/31/24  Magnesium   Result Value Ref Range   Magnesium  2.2 1.7 - 2.4 mg/dL  Basic Metabolic Panel - Cancer Center Only  Result Value Ref Range   Sodium 135 135 - 145 mmol/L   Potassium 4.3 3.5 - 5.1 mmol/L   Chloride 99 98 - 111 mmol/L   CO2 26 22 - 32 mmol/L   Glucose, Bld 105 (H) 70 - 99 mg/dL   BUN 18 8 - 23 mg/dL   Creatinine 9.03 9.38 - 1.24 mg/dL   Calcium  9.6 8.9 - 10.3 mg/dL   GFR, Estimated >39 >39 mL/min   Anion gap 10 5 - 15  CBC with Differential (Cancer Center Only)  Result Value Ref Range   WBC Count 7.5 4.0 - 10.5 K/uL   RBC 4.03 (L) 4.22 - 5.81 MIL/uL   Hemoglobin 12.3 (L) 13.0 - 17.0 g/dL   HCT 64.4 (L) 60.9 - 47.9 %   MCV 88.1 80.0 - 100.0 fL   MCH 30.5 26.0 - 34.0 pg   MCHC 34.6 30.0 - 36.0 g/dL   RDW 86.6 88.4 - 84.4 %   Platelet Count 174 150 - 400 K/uL   nRBC 0.0 0.0 - 0.2 %   Neutrophils Relative % 77 %   Neutro Abs 5.8 1.7 - 7.7 K/uL   Lymphocytes Relative 12 %   Lymphs Abs 0.9 0.7 - 4.0 K/uL   Monocytes  Relative 10 %   Monocytes Absolute 0.8 0.1 - 1.0 K/uL   Eosinophils Relative 0 %   Eosinophils Absolute 0.0 0.0 - 0.5 K/uL   Basophils Relative 0 %   Basophils Absolute 0.0 0.0 - 0.1 K/uL   Immature Granulocytes 1 %   Abs Immature Granulocytes 0.06 0.00 - 0.07 K/uL      RADIOGRAPHIC STUDIES:  I have personally reviewed the radiological images as listed and agree with the findings in the report.  No results found.    CODE STATUS:  Code Status History     Date Active Date Inactive Code Status Order ID Comments User Context   09/25/2024 1523 09/26/2024 0524 Full Code [id number]  Jenna Cordella LABOR, MD HOV   03/13/2024 1550 03/21/2024 1532 Full Code [id number]  Maurice Sharlet GORMAN DEVONNA Inpatient   03/04/2024 1247 03/13/2024 1548 Full Code [id number]  Viviane Lemond BRAVO, PA-C Inpatient   11/28/2023 1625 11/28/2023 2356 Full Code [id number]  Jordan, Peter M, MD Inpatient    Questions for Most Recent Historical Code Status (Order [id number])     Question Answer   By: Consent: discussion documented in  EHR            No orders of the defined types were placed in this encounter.    Future Appointments  Date Time Provider Department Center  11/01/2024  7:00 AM [facility] LINAC 4 [facility] None  11/03/2024 10:15 AM [facility] LINAC 4 [facility] None  11/04/2024 10:15 AM [facility] LINAC 4 [facility] None  11/04/2024 10:30 AM [facility] None  11/05/2024 10:15 AM [facility] LINAC 4 [facility] None  11/05/2024  1:00 PM [facility] 2 [facility]  11/06/2024  9:30 AM [facility] FLUSH [facility] None  11/06/2024 10:15 AM [facility] LINAC 4 [facility] None  11/06/2024 10:30 AM [facility] INFUSION [facility] None  11/06/2024 11:15 AM Neff, Barbara L, RD [facility] None  11/11/2024 10:15 AM [facility] LINAC 4 [facility] None  11/12/2024 10:15 AM [facility] LINAC 4 [facility] None  11/13/2024 10:15 AM [facility] LINAC 4 [facility] None  11/14/2024  9:00 AM [facility] FLUSH  [facility] None  11/14/2024  9:30 AM Tranisha Tissue, MD [facility] None  11/14/2024 10:15 AM [facility] LINAC 4 [facility] None  11/15/2024 10:15 AM [facility] LINAC 4 [facility] None  11/15/2024 10:30 AM [facility] INFUSION [facility] None  11/15/2024 12:00 PM Ivonne Harlene RAMAN, RD [facility] None  11/18/2024 10:15 AM [facility] LINAC 4 [facility] None  11/19/2024 10:15 AM [facility] LINAC 4 [facility] None  11/20/2024 10:15 AM [facility] LINAC 4 [facility] None  11/21/2024 10:15 AM [facility] LINAC 4 [facility] None  11/22/2024 10:15 AM [facility] LINAC 4 [facility] None  11/25/2024 10:15 AM [facility] LINAC 4 [facility] None  11/26/2024 10:15 AM [facility] LINAC 4 [facility] None  11/27/2024 10:15 AM [facility] LINAC 4 [facility] None  12/19/2024 10:00 AM Breedlove Blue, Blaire L, PT [facility] None  01/22/2025 10:30 AM Fountain, Lum CROME, NP [facility] H[facility]     This document was completed utilizing speech recognition software. Grammatical errors, random word insertions, pronoun errors, and incomplete sentences are an occasional consequence of this system due to software limitations, ambient noise, and hardware issues. Any formal questions or concerns about the content, text or information contained within the body of this dictation should be directly addressed to the provider for clarification.

## 2024-10-31 NOTE — Progress Notes (Signed)
 Nutrition Follow-up:  Pt with stage IV cancer of pyriform sinus. He is receiving concurrent chemoradiation with weekly cisplatin (start 10/23). He does not have feeding tube. Patient followed by Dr. Autumn and Dr. Izell.   Met with patient in infusion. He reports withering away. Foods taste horrible. States he's not going to eat anything that taste like crap. Intake further challenged by missing upper teeth. He did like the taste of scrambled eggs with swiss cheese after he added lots of salt. Noting salt is not good s/p heart surgery. He was also able to eat a couple pancakes that were burnt. He is drinking 2 CIB and 80 ounces or more of water. Patient denies sore throat or painful swallow. Saliva is thick. He is doing salt water gargles. Patient unable to recall amount of baking soda.   Patient agreeable to feeding tube. He reports speaking with a friend at [facility] who places tubes. Said he made him feel comfortable about procedure. Patient was assured that he would not need continuous feedings via pump. This was his father had and it was miserable.   Medications: reviewed   Labs: reviewed   Anthropometrics: Wt 227 lb today decreased 3% in 7 days - this is severe for time frame  11/13 - 234 lb 11/5 - 239 lb 6.4 oz  10/29 - 238 lb 12.8 oz  10/20 - 239 lb 4.8 oz  10/13 - 239 lb 3.2 oz    Estimated Energy Needs  Kcals: 2700-3080 Protein: 132-154 Fluid: 3 L  NUTRITION DIAGNOSIS: Food and nutrition related knowledge deficit improving    INTERVENTION:  Educated on strategies for taste changes - suggested trying baking soda salt water gargles before eating, handout with tips + recipe provided Continue drinking CIB - recommend increasing to 3/day Provided samples of KF 1.4, Boost VHC for pt to try Patient agreeable to FT - discussed with Dr. Autumn via secure chat, IR referral placed  Will provide bolus education pending PEG placement    MONITORING, EVALUATION, GOAL: wt trends,  intake   NEXT VISIT: Wednesday November 26 during infusion with Heron

## 2024-10-31 NOTE — Progress Notes (Signed)
 Oncology Nurse Navigator Documentation   Met with Dave Williams to provide PEG education prior to 11/05/24 placement.  Using  PEG teaching device and Teach Back, provided education for PEG use and care, including: hand hygiene, gravity bolus administration of daily water flushes and nutritional supplement, fluids and medications; care of tube insertion site including daily dressing change and cleaning; S&S of infection.   Dave Williams correctly verbalized procedures for gravity administration of water, dressing change and site care.  I provided written instructions for PEG flushing/dressing change in support of verbal instruction.   I provided/described contents of Start of Care Bolus Feeding Kit (2 60 cc syringes, 1 box 4x4 drainage sponges, 1 package mesh briefs, 1 roll paper tape, 1 case Osmolite 1.5).  He voiced understanding he is to start using Osmolite per guidance of Nutrition. He knows to hold his 81 mg aspirin  starting today.  He understands I will be available for ongoing PEG support. Provided barium sulfate prep which I obtained from [facility] and reviewed instructions.    [name] RN, BSN, OCN Head & Neck Oncology Nurse Navigator [facility] Phone # [phone]  Fax # [phone]

## 2024-10-31 NOTE — Progress Notes (Signed)
 RE: GTUBE REVIEW Received: Today Jenna Cordella LABOR, MD  Tommie Riggs OK to set up for G tube placement with any provider.       Previous Messages    ----- Message ----- From: Tommie Riggs Sent: 10/31/2024   1:46 PM EST To: Ir Procedure Requests Subject: GTUBE REVIEW                                  PROCEDURE:  GTUBE PLACEMENT  REASON: receiving chemo/radiation for head and neck cancer, patient is struggling with po intake  HISTORY: PET 8/21  PHYSICIAN: PASAM

## 2024-10-31 NOTE — Assessment & Plan Note (Addendum)
 Please review oncology history for additional details and timeline of events.  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  Patient declined surgical option.  Hence plan made to proceed with concurrent chemoradiation.  Renal function is fluctuating and this needs to be monitored closely. The cancer has a high risk of recurrence, necessitating aggressive treatment.  We have discussed about role of cisplatin being a radiosensitizer in the treatment of head and neck cancer.  We have discussed about the curative intent of chemoradiation for this patient.  Patient is willing to proceed with weekly cisplatin.  He has been tolerating chemotherapy reasonably well.  Lately he has been having altered taste, dry mouth, which is making him difficult to eat and he has lost 7 pounds in the last week.  Previously declined feeding tube placement but is now willing to proceed with this.  Will arrange this with IR next week.  Due for cycle 5 of chemotherapy with cisplatin today.  Labs reveal no dose-limiting toxicities.  Will proceed with cycle 5 today.  - Encourage hydration to maintain kidney function, especially if not using a feeding tube.  Appropriate referrals were previously made for supportive care including physical therapy, speech pathology, nutrition.  RTC in 1 week for labs, and continuation of chemotherapy.  RTC in 2 weeks for labs, office visit and chemotherapy.

## 2024-11-01 ENCOUNTER — Other Ambulatory Visit: Payer: Self-pay

## 2024-11-01 ENCOUNTER — Ambulatory Visit
Admission: RE | Admit: 2024-11-01 | Discharge: 2024-11-01 | Disposition: A | Discharge status: Home / Self Care | Source: Ambulatory Visit | Attending: Radiation Oncology | Admitting: Radiation Oncology

## 2024-11-01 LAB — RAD ONC ARIA SESSION SUMMARY
Course Elapsed Days: 29
Plan Fractions Treated to Date: 18
Plan Prescribed Dose Per Fraction: 2 Gy
Plan Total Fractions Prescribed: 35
Plan Total Prescribed Dose: 70 Gy
Reference Point Dosage Given to Date: 36 Gy
Reference Point Session Dosage Given: 2 Gy
Session Number: 18

## 2024-11-03 ENCOUNTER — Ambulatory Visit
Admission: RE | Admit: 2024-11-03 | Discharge: 2024-11-03 | Disposition: A | Discharge status: Home / Self Care | Source: Ambulatory Visit | Attending: Radiation Oncology | Admitting: Radiation Oncology

## 2024-11-03 ENCOUNTER — Other Ambulatory Visit: Payer: Self-pay

## 2024-11-03 LAB — RAD ONC ARIA SESSION SUMMARY
Course Elapsed Days: 31
Plan Fractions Treated to Date: 19
Plan Prescribed Dose Per Fraction: 2 Gy
Plan Total Fractions Prescribed: 35
Plan Total Prescribed Dose: 70 Gy
Reference Point Dosage Given to Date: 38 Gy
Reference Point Session Dosage Given: 2 Gy
Session Number: 19

## 2024-11-04 ENCOUNTER — Other Ambulatory Visit: Payer: Self-pay

## 2024-11-04 ENCOUNTER — Other Ambulatory Visit: Payer: Self-pay | Admitting: Student

## 2024-11-04 ENCOUNTER — Ambulatory Visit
Admission: RE | Admit: 2024-11-04 | Discharge: 2024-11-04 | Disposition: A | Discharge status: Home / Self Care | Source: Ambulatory Visit | Attending: Radiation Oncology | Admitting: Radiation Oncology

## 2024-11-04 ENCOUNTER — Other Ambulatory Visit (HOSPITAL_COMMUNITY): Payer: Self-pay | Admitting: Radiology

## 2024-11-04 DIAGNOSIS — Z01818 Encounter for other preprocedural examination: Secondary | ICD-10-CM

## 2024-11-04 LAB — RAD ONC ARIA SESSION SUMMARY
Course Elapsed Days: 32
Plan Fractions Treated to Date: 20
Plan Prescribed Dose Per Fraction: 2 Gy
Plan Total Fractions Prescribed: 35
Plan Total Prescribed Dose: 70 Gy
Reference Point Dosage Given to Date: 40 Gy
Reference Point Session Dosage Given: 2 Gy
Session Number: 20

## 2024-11-04 NOTE — H&P (Signed)
 Chief Complaint: Patient was seen in consultation today for head/neck cancer  Referring Physician(s): Pasam,Avinash  Supervising Physician: Jennefer Rover  Patient Status: Dave Williams  History of Present Illness: Dave Williams is a 68 y.o. male with a medical history significant for anxiety, CAD s/p CABG, HTN, stroke, paroxysmal atrial fibrillation (Eliquis ) and polysubstance abuse. He also has a history of cancer of the piriform sinus and is familiar to IR from port-a-catheter placement 09/25/24.   In June 2025 he developed dysphagia and noticed a growth on the right side of his neck. He underwent laryngoscopy which revealed a hypopharynx mass and biopsy was positive for invasive squamous cell carcinoma. Subsequent work up including a PET scan showed lymphadenopathy and pulmonary nodules consistent with metastatic disease.   He was started on chemoradiation and is having an increasingly difficult time with nutrition. He weight is rapidly decreasing and his Oncology team has recommended gastrostomy tube placement.   Patient alert and laying in bed, calm.  Currently without any significant complaints. He is feeling well overall per his report. Patient denies any fevers, headache, chest pain, SOB, cough, abdominal pain, nausea, vomiting or bleeding.     Past Medical History:  Diagnosis Date   Anxiety    Chronic left hip pain    Compression fracture of spine (HCC)    multiple compression Fx with disc bulge   Coronary artery disease    Hypertension    Pneumonia    As a teenager   Stroke [facility]) 2022   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    Past Surgical History:  Procedure Laterality Date   BACK SURGERY     Radiocartography T2-T12   CORONARY ARTERY BYPASS GRAFT N/A 03/04/2024   Procedure: CORONARY ARTERY BYPASS GRAFTING (CABG) TIMES THREE UTILIZING LEFT INTERNAL MAMMARY ARTERY AND ENDOSCOPIC VEIN HARVEST RIGHT  GREATER SAPHENOUS VEIN;  Surgeon: Shyrl Linnie KIDD, MD;  Location: [facility] OR;  Service: Open Heart Surgery;  Laterality: N/A;   DIRECT LARYNGOSCOPY N/A 07/24/2024   Procedure: LARYNGOSCOPY, DIRECT;  Surgeon: Tobie Eldora NOVAK, MD;  Location: [facility] OR;  Service: ENT;  Laterality: N/A;  Direct Laryngoscopy with biopsy with use of operating telescope   IR IMAGING GUIDED PORT INSERTION  09/25/2024   KNEE ARTHROSCOPY Bilateral    LEFT HEART CATH AND CORONARY ANGIOGRAPHY N/A 11/28/2023   Procedure: LEFT HEART CATH AND CORONARY ANGIOGRAPHY;  Surgeon: Jordan, Peter M, MD;  Location: [facility] INVASIVE CV LAB;  Service: Cardiovascular;  Laterality: N/A;   PILONIDAL CYST EXCISION     In patients 20s   RIGID ESOPHAGOSCOPY N/A 07/24/2024   Procedure: ESOPHAGOSCOPY, RIGID;  Surgeon: Tobie Eldora NOVAK, MD;  Location: [facility] OR;  Service: ENT;  Laterality: N/A;   TEE WITHOUT CARDIOVERSION N/A 03/04/2024   Procedure: ECHOCARDIOGRAM, TRANSESOPHAGEAL;  Surgeon: Shyrl Linnie KIDD, MD;  Location: [facility] OR;  Service: Open Heart Surgery;  Laterality: N/A;    Allergies: Prednisone   Medications: Prior to Admission medications   Medication Sig Start Date End Date Taking? Authorizing Provider  acetaminophen  (TYLENOL ) 500 MG tablet Take 1,000 mg by mouth every 6 (six) hours as needed (pain.).    [provider]  ALPRAZolam  (XANAX ) 0.5 MG tablet Take 1 tablet (0.5 mg total) by mouth at bedtime as needed for anxiety or sleep. 10/31/24   Pasam, Chinita, MD  amLODipine  (NORVASC ) 10 MG tablet Take 1 tablet (10 mg total) by mouth daily. 04/10/24  Rana Dixon L, NP  aspirin  EC 81 MG tablet Take 1 tablet (81 mg total) by mouth daily. Swallow whole. 07/26/24   Tobie Eldora NOVAK, MD  atorvastatin  (LIPITOR ) 80 MG tablet Take 1 tablet (80 mg total) by mouth daily with supper. 04/22/24   Court Dorn PARAS, MD  HYDROcodone -acetaminophen  (NORCO) 7.5-325 MG tablet Take 1 tablet by mouth every 6 (six) hours as needed for severe pain (pain  score 7-10). 10/16/24   Pasam, Chinita, MD  isosorbide  mononitrate (IMDUR ) 30 MG 24 hr tablet Take 1 tablet (30 mg total) by mouth daily. 04/10/24   Rana Dixon CROME, NP  lidocaine  (XYLOCAINE ) 2 % solution Patient: Mix 1part 2% viscous lidocaine , 1part H20. Swish & swallow 10mL of diluted mixture, 30min before meals and at bedtime, up to QID 10/07/24   Izell Domino, MD  lidocaine -prilocaine  (EMLA ) cream Apply to affected area once 08/30/24   Pasam, Avinash, MD  nitroGLYCERIN  (NITROSTAT ) 0.4 MG SL tablet Place 1 tablet (0.4 mg total) under the tongue every 5 (five) minutes as needed for chest pain. DISSOLVE 1 TABLET UNDER THE TONGUE EVERY 5 MINUTES AS NEEDED FOR CHEST PAIN. DO NOT EXCEED A TOTAL OF 3 DOSES IN 15 MINUTES. 04/10/24   Fountain, Dixon L, NP  ondansetron  (ZOFRAN ) 8 MG tablet Take 1 tablet (8 mg total) by mouth every 8 (eight) hours as needed for nausea or vomiting. Start on the third day after cisplatin . 08/30/24   Pasam, Chinita, MD  pantoprazole  (PROTONIX ) 40 MG tablet Take 1 tablet (40 mg total) by mouth daily. 03/21/24   Love, Sharlet RAMAN, PA-C  prochlorperazine  (COMPAZINE ) 10 MG tablet Take 1 tablet (10 mg total) by mouth every 6 (six) hours as needed (Nausea or vomiting). 08/30/24   Pasam, Avinash, MD  vitamin D3 (CHOLECALCIFEROL ) 25 MCG tablet Take 1 tablet (1,000 Units total) by mouth daily. 03/21/24   Love, Sharlet RAMAN, PA-C     Family History  Problem Relation Age of Onset   Cancer Mother    Hypertension Mother    Heart failure Mother    Throat cancer Father        Throat cancer mets to spine   Hypertension Father    Heart failure Father    Heart disease Father    Thyroid  cancer Sister    Skin cancer Maternal Aunt     Social History   Socioeconomic History   Marital status: Single    Spouse name: Not on file   Number of children: Not on file   Years of education: Not on file   Highest education level: Not on file  Occupational History   Not on file  Tobacco Use    Smoking status: Former    Current packs/day: 0.50    Types: Cigarettes   Smokeless tobacco: Never  Vaping Use   Vaping status: Never Used  Substance and Sexual Activity   Alcohol use: No    Comment: Sober for 20 yeras as of 02/2024   Drug use: Not Currently    Types: Crack cocaine, Marijuana    Comment: Used for 30+ years but quit in 1990s and has not picked up since   Sexual activity: Yes  Other Topics Concern   Not on file  Social History Narrative   Not on file   Social Drivers of Health   Financial Resource Strain: Not on file  Food Insecurity: No Food Insecurity (08/07/2024)   Hunger Vital Sign    Worried About Running Out of Food in  the Last Year: Never true    Ran Out of Food in the Last Year: Never true  Transportation Needs: No Transportation Needs (08/07/2024)   PRAPARE - Administrator, Civil Service (Medical): No    Lack of Transportation (Non-Medical): No  Physical Activity: Not on file  Stress: Not on file  Social Connections: Moderately Integrated (03/05/2024)   Social Connection and Isolation Panel    Frequency of Communication with Friends and Family: More than three times a week    Frequency of Social Gatherings with Friends and Family: More than three times a week    Attends Religious Services: More than 4 times per year    Active Member of [employer] or Organizations: Yes    Attends Banker Meetings: 1 to 4 times per year    Marital Status: Divorced    Review of Systems: A 12 point ROS discussed and pertinent positives are indicated in the HPI above.  All other systems are negative.  Vital Signs: BP 116/75   Pulse 85   Temp 98 F (36.7 C) (Oral)   Resp 16   Ht 6' 1 (1.854 m)   Wt 224 lb (101.6 kg)   SpO2 98%   BMI 29.55 kg/m   Physical Exam Vitals reviewed.  Constitutional:      General: He is not in acute distress.    Appearance: Normal appearance.  HENT:     Mouth/Throat:     Mouth: Mucous membranes are dry.   Cardiovascular:     Rate and Rhythm: Normal rate and regular rhythm.     Pulses: Normal pulses.     Heart sounds: Normal heart sounds.  Pulmonary:     Effort: Pulmonary effort is normal.  Abdominal:     General: Abdomen is flat. There is no distension.     Palpations: Abdomen is soft.     Tenderness: There is no abdominal tenderness.  Musculoskeletal:        General: Normal range of motion.     Cervical back: Normal range of motion.  Skin:    General: Skin is warm and dry.  Neurological:     Mental Status: He is alert and oriented to person, place, and time.  Psychiatric:        Mood and Affect: Mood normal.        Behavior: Behavior normal.        Thought Content: Thought content normal.        Judgment: Judgment normal.     Imaging: No results found.  Labs:  CBC: Recent Labs    10/09/24 1047 10/16/24 0859 10/23/24 0905 10/31/24 0831  WBC 8.4 9.1 8.5 7.5  HGB 12.6* 12.0* 12.6* 12.3*  HCT 37.8* 35.1* 37.4* 35.5*  PLT 300 250 228 174    COAGS: Recent Labs    02/29/24 1053 03/04/24 1253  INR 1.0 1.3*  APTT 34 37*    BMP: Recent Labs    10/09/24 1047 10/16/24 0859 10/23/24 0905 10/31/24 0831  NA 135 135 135 135  K 4.0 4.3 4.4 4.3  CL 103 102 102 99  CO2 27 28 27 26   GLUCOSE 163* 130* 108* 105*  BUN 14 20 17 18   CALCIUM  9.4 9.0 9.6 9.6  CREATININE 0.97 0.93 0.97 0.96  GFRNONAA >60 >60 >60 >60    LIVER FUNCTION TESTS: Recent Labs    02/29/24 1053 03/14/24 0624 06/19/24 1353 08/07/24 1440  BILITOT 0.9 1.1 0.9 1.1  AST 36 28  20 16  ALT 37 33 27 14  ALKPHOS 40 49 61 49  PROT 7.4 6.9 8.3* 7.8  ALBUMIN  4.3 3.0* 4.7 4.6    TUMOR MARKERS: No results for input(s): AFPTM, CEA, CA199, CHROMGRNA in the last 8760 hours.  Assessment and Plan:  Head & Neck Cancer; rapid weight loss: Dave Williams, 68 year old male, presents today for an image-guided gastrostomy tube placement.   Risks and benefits image guided gastrostomy tube  placement was discussed with the patient including, but not limited to the need for a barium enema during the procedure, bleeding, infection, peritonitis and/or damage to adjacent structures.  All of the patient's questions were answered, patient is agreeable to proceed. He has been NPO.   Consent signed and in chart.  Thank you for this interesting consult.  I greatly enjoyed meeting Dave Williams and look forward to participating in their care.  A copy of this report was sent to the requesting provider on this date.  Electronically Signed: Warren Dais, AGACNP-BC 11/05/2024, 11:38 AM   I spent a total of  30 Minutes   in face to face in clinical consultation, greater than 50% of which was counseling/coordinating care for head/neck cancer.

## 2024-11-05 ENCOUNTER — Ambulatory Visit

## 2024-11-05 ENCOUNTER — Other Ambulatory Visit: Payer: Self-pay

## 2024-11-05 ENCOUNTER — Ambulatory Visit (HOSPITAL_COMMUNITY)
Admission: RE | Admit: 2024-11-05 | Discharge: 2024-11-05 | Disposition: A | Discharge status: Home / Self Care | Source: Ambulatory Visit | Attending: Oncology | Admitting: Oncology

## 2024-11-05 DIAGNOSIS — F419 Anxiety disorder, unspecified: Secondary | ICD-10-CM | POA: Insufficient documentation

## 2024-11-05 DIAGNOSIS — I480 Paroxysmal atrial fibrillation: Secondary | ICD-10-CM | POA: Insufficient documentation

## 2024-11-05 DIAGNOSIS — I10 Essential (primary) hypertension: Secondary | ICD-10-CM | POA: Diagnosis not present

## 2024-11-05 DIAGNOSIS — Z7982 Long term (current) use of aspirin: Secondary | ICD-10-CM | POA: Insufficient documentation

## 2024-11-05 DIAGNOSIS — I2510 Atherosclerotic heart disease of native coronary artery without angina pectoris: Secondary | ICD-10-CM | POA: Insufficient documentation

## 2024-11-05 DIAGNOSIS — Z79899 Other long term (current) drug therapy: Secondary | ICD-10-CM | POA: Diagnosis not present

## 2024-11-05 DIAGNOSIS — Z01818 Encounter for other preprocedural examination: Secondary | ICD-10-CM

## 2024-11-05 DIAGNOSIS — Z8673 Personal history of transient ischemic attack (TIA), and cerebral infarction without residual deficits: Secondary | ICD-10-CM | POA: Insufficient documentation

## 2024-11-05 DIAGNOSIS — C12 Malignant neoplasm of pyriform sinus: Secondary | ICD-10-CM | POA: Diagnosis present

## 2024-11-05 DIAGNOSIS — Z951 Presence of aortocoronary bypass graft: Secondary | ICD-10-CM | POA: Insufficient documentation

## 2024-11-05 DIAGNOSIS — Z87891 Personal history of nicotine dependence: Secondary | ICD-10-CM | POA: Diagnosis not present

## 2024-11-05 HISTORY — PX: IR GASTROSTOMY TUBE MOD SED: IMG625

## 2024-11-05 LAB — CBC
HCT: 33.3 % — ABNORMAL LOW (ref 39.0–52.0)
Hemoglobin: 11.5 g/dL — ABNORMAL LOW (ref 13.0–17.0)
MCH: 31 pg (ref 26.0–34.0)
MCHC: 34.5 g/dL (ref 30.0–36.0)
MCV: 89.8 fL (ref 80.0–100.0)
Platelets: 119 K/uL — ABNORMAL LOW (ref 150–400)
RBC: 3.71 MIL/uL — ABNORMAL LOW (ref 4.22–5.81)
RDW: 13.3 % (ref 11.5–15.5)
WBC: 5.3 K/uL (ref 4.0–10.5)
nRBC: 0 % (ref 0.0–0.2)

## 2024-11-05 LAB — PROTIME-INR
INR: 0.9 (ref 0.8–1.2)
Prothrombin Time: 12.5 s (ref 11.4–15.2)

## 2024-11-05 MED ORDER — CEFAZOLIN SODIUM-DEXTROSE 2-4 GM/100ML-% IV SOLN: 2.0000 g | Freq: Once | INTRAVENOUS | Status: DC

## 2024-11-05 MED ORDER — MIDAZOLAM HCL 2 MG/2ML IJ SOLN
INTRAMUSCULAR | Status: AC
  Filled 2024-11-05: qty 2

## 2024-11-05 MED ORDER — LIDOCAINE-EPINEPHRINE 1 %-1:100000 IJ SOLN
INTRAMUSCULAR | Status: AC
  Filled 2024-11-05: qty 1

## 2024-11-05 MED ORDER — CEFAZOLIN SODIUM-DEXTROSE 2-4 GM/100ML-% IV SOLN
INTRAVENOUS | Status: AC
  Filled 2024-11-05: qty 100

## 2024-11-05 MED ORDER — SODIUM CHLORIDE 0.9 % IV SOLN: INTRAVENOUS | Status: DC

## 2024-11-05 MED ORDER — MIDAZOLAM HCL (PF) 2 MG/2ML IJ SOLN
INTRAMUSCULAR | Status: AC | PRN
  Administered 2024-11-05 (×4): 1 mg via INTRAVENOUS

## 2024-11-05 MED ORDER — IOHEXOL 300 MG/ML  SOLN
50.0000 mL | Freq: Once | INTRAMUSCULAR | Status: AC | PRN
  Administered 2024-11-05: 15 mL

## 2024-11-05 MED ORDER — CEFAZOLIN SODIUM-DEXTROSE 2-4 GM/100ML-% IV SOLN
INTRAVENOUS | Status: AC | PRN
  Administered 2024-11-05: 2 g via INTRAVENOUS

## 2024-11-05 MED ORDER — FENTANYL CITRATE (PF) 100 MCG/2ML IJ SOLN
INTRAMUSCULAR | Status: AC | PRN
  Administered 2024-11-05 (×2): 50 ug via INTRAVENOUS

## 2024-11-05 MED ORDER — FENTANYL CITRATE (PF) 100 MCG/2ML IJ SOLN
INTRAMUSCULAR | Status: AC
  Filled 2024-11-05: qty 2

## 2024-11-05 MED FILL — Fosaprepitant Dimeglumine For IV Infusion 150 MG (Base Eq): INTRAVENOUS | Qty: 5 | Status: AC

## 2024-11-05 NOTE — Procedures (Signed)
 Interventional Radiology Procedure Note  Procedure: Placement of percutaneous 50F balloon-retention gastrostomy tube.  Complications: None  EBL:  < 5 mL  Recommendations: - NPO for 4 hours after placement - Maintain G-tube to low wall suction for 4 hours after placement - May advance diet as tolerated and begin using tube 4 hours after placement - Gastropexy sutures are absorbable and do not require removal  Marliss Coots, MD Pager: [phone]

## 2024-11-05 NOTE — Progress Notes (Signed)
 Client states will not stay until 1700; given AMA form and signed it

## 2024-11-06 ENCOUNTER — Inpatient Hospital Stay: Admitting: Nutrition

## 2024-11-06 ENCOUNTER — Other Ambulatory Visit: Payer: Self-pay | Admitting: Radiology

## 2024-11-06 ENCOUNTER — Inpatient Hospital Stay

## 2024-11-06 ENCOUNTER — Ambulatory Visit
Admission: RE | Admit: 2024-11-06 | Discharge: 2024-11-06 | Disposition: A | Discharge status: Home / Self Care | Source: Ambulatory Visit | Attending: Radiation Oncology | Admitting: Radiation Oncology

## 2024-11-06 ENCOUNTER — Other Ambulatory Visit: Payer: Self-pay

## 2024-11-06 VITALS — BP 123/84 | HR 74 | Temp 97.8°F | Resp 17 | Ht 73.0 in | Wt 219.5 lb

## 2024-11-06 DIAGNOSIS — C12 Malignant neoplasm of pyriform sinus: Secondary | ICD-10-CM

## 2024-11-06 DIAGNOSIS — J387 Other diseases of larynx: Secondary | ICD-10-CM

## 2024-11-06 LAB — CBC WITH DIFFERENTIAL (CANCER CENTER ONLY)
Abs Immature Granulocytes: 0.01 K/uL (ref 0.00–0.07)
Basophils Absolute: 0 K/uL (ref 0.0–0.1)
Basophils Relative: 0 %
Eosinophils Absolute: 0 K/uL (ref 0.0–0.5)
Eosinophils Relative: 0 %
HCT: 33.9 % — ABNORMAL LOW (ref 39.0–52.0)
Hemoglobin: 11.5 g/dL — ABNORMAL LOW (ref 13.0–17.0)
Immature Granulocytes: 0 %
Lymphocytes Relative: 10 %
Lymphs Abs: 0.5 K/uL — ABNORMAL LOW (ref 0.7–4.0)
MCH: 30.2 pg (ref 26.0–34.0)
MCHC: 33.9 g/dL (ref 30.0–36.0)
MCV: 89 fL (ref 80.0–100.0)
Monocytes Absolute: 0.5 K/uL (ref 0.1–1.0)
Monocytes Relative: 10 %
Neutro Abs: 3.8 K/uL (ref 1.7–7.7)
Neutrophils Relative %: 80 %
Platelet Count: 103 K/uL — ABNORMAL LOW (ref 150–400)
RBC: 3.81 MIL/uL — ABNORMAL LOW (ref 4.22–5.81)
RDW: 13.2 % (ref 11.5–15.5)
WBC Count: 4.7 K/uL (ref 4.0–10.5)
nRBC: 0 % (ref 0.0–0.2)

## 2024-11-06 LAB — RAD ONC ARIA SESSION SUMMARY
Course Elapsed Days: 34
Plan Fractions Treated to Date: 21
Plan Prescribed Dose Per Fraction: 2 Gy
Plan Total Fractions Prescribed: 35
Plan Total Prescribed Dose: 70 Gy
Reference Point Dosage Given to Date: 42 Gy
Reference Point Session Dosage Given: 2 Gy
Session Number: 21

## 2024-11-06 LAB — BASIC METABOLIC PANEL - CANCER CENTER ONLY
Anion gap: 8 (ref 5–15)
BUN: 14 mg/dL (ref 8–23)
CO2: 27 mmol/L (ref 22–32)
Calcium: 9.6 mg/dL (ref 8.9–10.3)
Chloride: 101 mmol/L (ref 98–111)
Creatinine: 0.9 mg/dL (ref 0.61–1.24)
GFR, Estimated: >60 mL/min (ref >60)
Glucose, Bld: 102 mg/dL — ABNORMAL HIGH (ref 70–99)
Potassium: 4.3 mmol/L (ref 3.5–5.1)
Sodium: 136 mmol/L (ref 135–145)

## 2024-11-06 LAB — MAGNESIUM: Magnesium: 2.2 mg/dL (ref 1.7–2.4)

## 2024-11-06 MED ORDER — NUTREN 1.5 EN LIQD: ENTERAL | 12 refills | Status: AC

## 2024-11-06 MED ORDER — SODIUM CHLORIDE 0.9 % IV SOLN
150.0000 mg | Freq: Once | INTRAVENOUS | Status: AC
  Administered 2024-11-06: 150 mg via INTRAVENOUS
  Filled 2024-11-06: qty 150

## 2024-11-06 MED ORDER — POTASSIUM CHLORIDE IN NACL 20-0.9 MEQ/L-% IV SOLN
Freq: Once | INTRAVENOUS | Status: AC
  Filled 2024-11-06: qty 1000

## 2024-11-06 MED ORDER — PALONOSETRON HCL INJECTION 0.25 MG/5ML
0.2500 mg | Freq: Once | INTRAVENOUS | Status: AC
  Administered 2024-11-06: 0.25 mg via INTRAVENOUS
  Filled 2024-11-06: qty 5

## 2024-11-06 MED ORDER — SODIUM CHLORIDE 0.9 % IV SOLN
40.0000 mg/m2 | Freq: Once | INTRAVENOUS | Status: AC
  Administered 2024-11-06: 100 mg via INTRAVENOUS
  Filled 2024-11-06: qty 100

## 2024-11-06 MED ORDER — SODIUM CHLORIDE 0.9 % IV SOLN: INTRAVENOUS | Status: DC

## 2024-11-06 MED ORDER — MAGNESIUM SULFATE 2 GM/50ML IV SOLN
2.0000 g | Freq: Once | INTRAVENOUS | Status: AC
  Administered 2024-11-06: 2 g via INTRAVENOUS
  Filled 2024-11-06: qty 50

## 2024-11-06 NOTE — Patient Instructions (Signed)
 [facility] - A DEPT OF [facility]  Discharge Instructions: Thank you for choosing [facility] to provide your oncology and hematology care.   If you have a lab appointment with the Cancer Center, please go directly to the Cancer Center and check in at the registration area.   Wear comfortable clothing and clothing appropriate for easy access to any Portacath or PICC line.   We strive to give you quality time with your provider. You may need to reschedule your appointment if you arrive late (15 or more minutes).  Arriving late affects you and other patients whose appointments are after yours.  Also, if you miss three or more appointments without notifying the office, you may be dismissed from the clinic at the provider's discretion.      For prescription refill requests, have your pharmacy contact our office and allow 72 hours for refills to be completed.    Today you received the following chemotherapy and/or immunotherapy agents: Cisplatin  (Platinol )    To help prevent nausea and vomiting after your treatment, we encourage you to take your nausea medication as directed.  BELOW ARE SYMPTOMS THAT SHOULD BE REPORTED IMMEDIATELY: *FEVER GREATER THAN 100.4 F (38 C) OR HIGHER *CHILLS OR SWEATING *NAUSEA AND VOMITING THAT IS NOT CONTROLLED WITH YOUR NAUSEA MEDICATION *UNUSUAL SHORTNESS OF BREATH *UNUSUAL BRUISING OR BLEEDING *URINARY PROBLEMS (pain or burning when urinating, or frequent urination) *BOWEL PROBLEMS (unusual diarrhea, constipation, pain near the anus) TENDERNESS IN MOUTH AND THROAT WITH OR WITHOUT PRESENCE OF ULCERS (sore throat, sores in mouth, or a toothache) UNUSUAL RASH, SWELLING OR PAIN  UNUSUAL VAGINAL DISCHARGE OR ITCHING   Items with * indicate a potential emergency and should be followed up as soon as possible or go to the Emergency Department if any problems should occur.  Please show the CHEMOTHERAPY ALERT CARD or  IMMUNOTHERAPY ALERT CARD at check-in to the Emergency Department and triage nurse.  Should you have questions after your visit or need to cancel or reschedule your appointment, please contact [facility] - A DEPT OF [facility]  Dept: [phone]  and follow the prompts.  Office hours are 8:00 a.m. to 4:30 p.m. Monday - Friday. Please note that voicemails left after 4:00 p.m. may not be returned until the following business day.  We are closed weekends and major holidays. You have access to a nurse at all times for urgent questions. Please call the main number to the clinic Dept: [phone] and follow the prompts.   For any non-urgent questions, you may also contact your provider using MyChart. We now offer e-Visits for anyone 42 and older to request care online for non-urgent symptoms. For details visit [email or web address].   Also download the MyChart app! Go to the app store, search MyChart, open the app, select , and log in with your MyChart username and password.

## 2024-11-06 NOTE — Progress Notes (Signed)
 Due to a transportation issue, pt declined to finish the last hour of his two hour KCl post fluid with his cisplatin  treatment. Left AMA, stable and ambulatory to lobby. Pt understands he needs to hydrate well after treatment. Norleen Spain, CMA notified as Dr. Autumn is offline. MD will be updated on Monday.

## 2024-11-06 NOTE — Progress Notes (Addendum)
 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  Patient receives concurrent chemoradiation therapy with weekly cisplatin .  Patient is followed by Dr. Autumn and Dr. Izell.  Final radiation therapy is scheduled for December 18.  G-tube placed: November 25 DME: [employer]  *Expect long-term enteral nutrition required.  Weight: 219 pounds 8 ounces November 26 234 pounds November 13 238 pounds 12.8 ounces October 29  Labs include glucose 102 and magnesium  2.2  Estimated nutrition needs: 2700-3080 cal, 132-154 g protein, greater than 3 L fluid daily.  Patient is unable to eat adequate calories or protein.  This has resulted in 8% weight loss over 1 month/clinically significant.  Reports food just tastes slimy.  He is missing his top teeth making chewing challenging.  He tolerated a teriyaki chicken breast and some soggy honey nut cereal.  Also able to drink some Carnation breakfast essentials.  Reports minimal discomfort from feeding tube placement yesterday.  Patient is at risk for refeeding syndrome.  8 cartons of Nutren 1.5 will provide 3000 cal, 136 g protein, 1528 mL free water.  Nutrition diagnosis:  Food and nutrition related knowledge deficit, improved. Inadequate oral intake related to cancer and associated treatments as evidenced by 8% weight loss over 1 month.  Intervention: Education provided on administering bolus feeding to prevent 100% estimated nutrition needs.  Provided written fact sheet. Patient able to demonstrate a successful bolus feeding. Patient will begin with 1 carton 4 times a day with 60 mL free water before and after each bolus.  Patient will not advance tube feeding until refeeding labs are reviewed on Monday.  Eventual goal rate will be 8 cartons of Nutren 1.5 daily. (Recommend 2 cartons 4 times a day.  Flush with 60 mL free water before and after each bolus feeding.  Give additional 300 mL free water 3 times a  day.) Continue to swallow liquids for pleasure as tolerated to help maintain swallowing function. Orders written and home health contacted. Refeeding labs ordered 2 times a week beginning Monday.  Monitoring, evaluation, goals: Patient will tolerate adequate calories and protein to minimize weight loss.  Next visit: Friday, December 5 with Elvie during infusion.  **Disclaimer: This note was dictated with voice recognition software. Similar sounding words can inadvertently be transcribed and this note may contain transcription errors which may not have been corrected upon publication of note.**

## 2024-11-08 ENCOUNTER — Other Ambulatory Visit: Payer: Self-pay | Admitting: Oncology

## 2024-11-08 ENCOUNTER — Other Ambulatory Visit: Payer: Self-pay | Admitting: Emergency Medicine

## 2024-11-08 DIAGNOSIS — G893 Neoplasm related pain (acute) (chronic): Secondary | ICD-10-CM

## 2024-11-11 ENCOUNTER — Ambulatory Visit
Admission: RE | Admit: 2024-11-11 | Discharge: 2024-11-11 | Disposition: A | Source: Ambulatory Visit | Attending: Radiation Oncology

## 2024-11-11 ENCOUNTER — Other Ambulatory Visit: Payer: Self-pay

## 2024-11-11 DIAGNOSIS — Z510 Encounter for antineoplastic radiation therapy: Secondary | ICD-10-CM | POA: Insufficient documentation

## 2024-11-11 DIAGNOSIS — C12 Malignant neoplasm of pyriform sinus: Secondary | ICD-10-CM | POA: Insufficient documentation

## 2024-11-11 DIAGNOSIS — R519 Headache, unspecified: Secondary | ICD-10-CM | POA: Diagnosis not present

## 2024-11-11 DIAGNOSIS — Z951 Presence of aortocoronary bypass graft: Secondary | ICD-10-CM | POA: Insufficient documentation

## 2024-11-11 DIAGNOSIS — G893 Neoplasm related pain (acute) (chronic): Secondary | ICD-10-CM | POA: Insufficient documentation

## 2024-11-11 DIAGNOSIS — Z5111 Encounter for antineoplastic chemotherapy: Secondary | ICD-10-CM | POA: Diagnosis not present

## 2024-11-11 DIAGNOSIS — Z79891 Long term (current) use of opiate analgesic: Secondary | ICD-10-CM | POA: Insufficient documentation

## 2024-11-11 DIAGNOSIS — M542 Cervicalgia: Secondary | ICD-10-CM | POA: Diagnosis not present

## 2024-11-11 LAB — RAD ONC ARIA SESSION SUMMARY
Course Elapsed Days: 39
Plan Fractions Treated to Date: 22
Plan Prescribed Dose Per Fraction: 2 Gy
Plan Total Fractions Prescribed: 35
Plan Total Prescribed Dose: 70 Gy
Reference Point Dosage Given to Date: 44 Gy
Reference Point Session Dosage Given: 2 Gy
Session Number: 22

## 2024-11-12 ENCOUNTER — Other Ambulatory Visit: Payer: Self-pay

## 2024-11-12 ENCOUNTER — Ambulatory Visit
Admission: RE | Admit: 2024-11-12 | Discharge: 2024-11-12 | Disposition: A | Source: Ambulatory Visit | Attending: Radiation Oncology

## 2024-11-12 ENCOUNTER — Encounter (HOSPITAL_COMMUNITY): Payer: Self-pay | Admitting: *Deleted

## 2024-11-12 DIAGNOSIS — Z510 Encounter for antineoplastic radiation therapy: Secondary | ICD-10-CM | POA: Diagnosis not present

## 2024-11-12 DIAGNOSIS — Z951 Presence of aortocoronary bypass graft: Secondary | ICD-10-CM

## 2024-11-12 LAB — RAD ONC ARIA SESSION SUMMARY
Course Elapsed Days: 40
Plan Fractions Treated to Date: 23
Plan Prescribed Dose Per Fraction: 2 Gy
Plan Total Fractions Prescribed: 35
Plan Total Prescribed Dose: 70 Gy
Reference Point Dosage Given to Date: 46 Gy
Reference Point Session Dosage Given: 2 Gy
Session Number: 23

## 2024-11-12 NOTE — Progress Notes (Signed)
 Cardiac Individual Treatment Plan  Patient Details  Name: Dave Williams MRN: [id number] Date of Birth: [birth year] Referring Provider:   Flowsheet Row CARDIAC REHAB PHASE II ORIENTATION from 05/20/2024 in [facility] CARDIAC REHABILITATION  Referring Provider Court Carrier MD    Initial Encounter Date:  Flowsheet Row CARDIAC REHAB PHASE II ORIENTATION from 05/20/2024 in [place] CARDIAC REHABILITATION  Date 05/20/24    Visit Diagnosis: S/P CABG x 3  Patient's Home Medications on Admission:  Current Outpatient Medications:    acetaminophen  (TYLENOL ) 500 MG tablet, Take 1,000 mg by mouth every 6 (six) hours as needed (pain.)., Disp: , Rfl:    ALPRAZolam  (XANAX ) 0.5 MG tablet, Take 1 tablet (0.5 mg total) by mouth at bedtime as needed for anxiety or sleep., Disp: 30 tablet, Rfl: 0   amLODipine  (NORVASC ) 10 MG tablet, Take 1 tablet (10 mg total) by mouth daily., Disp: 90 tablet, Rfl: 2   aspirin  EC 81 MG tablet, Take 1 tablet (81 mg total) by mouth daily. Swallow whole., Disp: , Rfl:    atorvastatin  (LIPITOR ) 80 MG tablet, Take 1 tablet (80 mg total) by mouth daily with supper., Disp: 90 tablet, Rfl: 3   HYDROcodone -acetaminophen  (NORCO) 7.5-325 MG tablet, Take 1 tablet by mouth every 6 (six) hours as needed for moderate pain (pain score 4-6)., Disp: 60 tablet, Rfl: 0   isosorbide  mononitrate (IMDUR ) 30 MG 24 hr tablet, Take 1 tablet (30 mg total) by mouth daily., Disp: 90 tablet, Rfl: 1   lidocaine  (XYLOCAINE ) 2 % solution, Patient: Mix 1part 2% viscous lidocaine , 1part H20. Swish & swallow 10mL of diluted mixture, before meals and at bedtime, up to QID, Disp: 200 mL, Rfl: 3   lidocaine -prilocaine  (EMLA ) cream, Apply to affected area once, Disp: 30 g, Rfl: 3   nitroGLYCERIN  (NITROSTAT ) 0.4 MG SL tablet, Place 1 tablet (0.4 mg total) under the tongue every 5 (five) minutes as needed for chest pain. DISSOLVE 1 TABLET UNDER THE TONGUE EVERY 5 MINUTES AS NEEDED FOR CHEST PAIN. DO NOT  EXCEED A TOTAL OF 3 DOSES IN 15 MINUTES., Disp: 25 tablet, Rfl: 6   Nutritional Supplements (NUTREN 1.5) LIQD, 2 cartons Nutren 1.5 via G-tube 4 times daily with 60 mL free water before and after each bolus feeding.  Give additional 300 mL water 3 times daily between feeds.  Provides 3000 cal, 136 g protein, 2908 mL free water./100% estimated needs., Disp: 1896 mL, Rfl: 12   ondansetron  (ZOFRAN ) 8 MG tablet, Take 1 tablet (8 mg total) by mouth every 8 (eight) hours as needed for nausea or vomiting. Start on the third day after cisplatin ., Disp: 30 tablet, Rfl: 1   pantoprazole  (PROTONIX ) 40 MG tablet, Take 1 tablet (40 mg total) by mouth daily., Disp: 30 tablet, Rfl: 0   prochlorperazine  (COMPAZINE ) 10 MG tablet, Take 1 tablet (10 mg total) by mouth every 6 (six) hours as needed (Nausea or vomiting)., Disp: 30 tablet, Rfl: 1   vitamin D3 (CHOLECALCIFEROL ) 25 MCG tablet, Take 1 tablet (1,000 Units total) by mouth daily., Disp: 30 tablet, Rfl: 0  Past Medical History: Past Medical History:  Diagnosis Date   Anxiety    Chronic left hip pain    Compression fracture of spine (HCC)    multiple compression Fx with disc bulge   Coronary artery disease    Hypertension    Pneumonia    As a teenager   Stroke [facility]) 2022   Old Infarct noted on CT Scan   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    Tobacco Use: Social History   Tobacco Use  Smoking Status Former   Current packs/day: 0.50   Types: Cigarettes  Smokeless Tobacco Never    Labs: Review Flowsheet       Latest Ref Rng & Units 02/29/2024 03/04/2024 03/06/2024  Labs for ITP Cardiac and Pulmonary Rehab  Cholestrol 0 - 200 mg/dL - - 94   LDL (calc) 0 - 99 mg/dL - - 50   HDL-C >59 mg/dL - - 22   Trlycerides <849 mg/dL - - 887   Hemoglobin J8r 4.8 - 5.6 % 5.8  - -  PH, Arterial 7.35 - 7.45 - 7.324  7.306  7.340  7.280  7.271  7.290  7.314  -  PCO2 arterial 32 - 48 mmHg - 41.3  40.1  43.4  32.8  49.2  48.8   51.8  -  Bicarbonate 20.0 - 28.0 mmol/L - 21.1  19.7  23.1  15.4  22.8  23.5  25.5  26.4  -  TCO2 22 - 32 mmol/L - 22  21  24  16  24  23  25  26  27  28  25  25   -  Acid-base deficit 0.0 - 2.0 mmol/L - 4.0  6.0  2.0  10.0  4.0  3.0  1.0  -  O2 Saturation % - 95  97  99  98  98  96  84  100  -    Details       Multiple values from one day are sorted in reverse-chronological order         Capillary Blood Glucose: Lab Results  Component Value Date   GLUCAP 113 (H) 03/08/2024   GLUCAP 121 (H) 03/07/2024   GLUCAP 112 (H) 03/07/2024   GLUCAP 111 (H) 03/07/2024   GLUCAP 136 (H) 03/06/2024     Exercise Target Goals: Exercise Program Goal: Individual exercise prescription set using results from initial 6 min walk test and THRR while considering  patient's activity barriers and safety.   Exercise Prescription Goal: Starting with aerobic activity 30 plus minutes a day, 3 days per week for initial exercise prescription. Provide home exercise prescription and guidelines that participant acknowledges understanding prior to discharge.  Activity Barriers & Risk Stratification:  Activity Barriers & Cardiac Risk Stratification - 05/16/24 0921       Activity Barriers & Cardiac Risk Stratification   Activity Barriers Back Problems;Chest Pain/Angina;Shortness of Breath   Multiple back sugeries for back.   Cardiac Risk Stratification High          6 Minute Walk:  6 Minute Walk     Row Name 05/20/24 1119 09/30/24 0941       6 Minute Walk   Phase Initial Discharge    Distance 1432 feet 1400 feet    Distance % Change -- -0.23 %    Distance Feet Change -- -32 ft    Walk Time 6 minutes 6 minutes    # of Rest Breaks 0 0    MPH 2.71 2.65    METS 3.31 3.23    RPE 8 13    VO2 Peak 11.57 11.3    Symptoms No Yes (comment)    Comments -- some pain from portacath procedure last week 3/10    Resting HR 69 bpm 93 bpm    Resting BP 110/70 124/64    Resting Oxygen Saturation  95 % --  Exercise Oxygen Saturation  during 6 min walk 97 % --    Max Ex. HR 99 bpm 95 bpm    Max Ex. BP 136/62 156/68    2 Minute Post BP 126/62 --       Oxygen Initial Assessment:   Oxygen Re-Evaluation:   Oxygen Discharge (Final Oxygen Re-Evaluation):   Initial Exercise Prescription:  Initial Exercise Prescription - 05/20/24 1100       Date of Initial Exercise RX and Referring Provider   Date 05/20/24    Referring Provider Court Carrier MD      Oxygen   Maintain Oxygen Saturation 88% or higher      Treadmill   MPH 2.7    Grade 1    Minutes 15    METs 3.44      REL-XR   Level 13    Speed 50    Minutes 15    METs 3.4      Prescription Details   Frequency (times per week) 1   starting with one to meet OOP   Duration Progress to 30 minutes of continuous aerobic without signs/symptoms of physical distress      Intensity   THRR 40-80% of Max Heartrate 102-137    Ratings of Perceived Exertion 11-13    Perceived Dyspnea 0-4      Progression   Progression Continue to progress workloads to maintain intensity without signs/symptoms of physical distress.      Resistance Training   Training Prescription Yes    Weight 5 lb    Reps 10-15          Perform Capillary Blood Glucose checks as needed.  Exercise Prescription Changes:   Exercise Prescription Changes     Row Name 05/20/24 1100 06/12/24 1300 07/17/24 1300 07/26/24 0800 07/26/24 0900     Response to Exercise   Blood Pressure (Admit) 110/70 104/62 126/82 124/66 --   Blood Pressure (Exercise) 136/62 144/72 -- -- --   Blood Pressure (Exit) 126/62 120/64 100/60 126/62 --   Heart Rate (Admit) 69 bpm 83 bpm 65 bpm 70 bpm --   Heart Rate (Exercise) 99 bpm 110 bpm 102 bpm 103 bpm --   Heart Rate (Exit) 76 bpm 90 bpm 85 bpm 77 bpm --   Oxygen Saturation (Admit) 95 % -- -- -- --   Oxygen Saturation (Exercise) 97 % -- -- -- --   Rating of Perceived Exertion (Exercise) 8 13 14 13  --   Symptoms none -- -- -- --    Comments walk test results -- -- -- --   Duration -- Continue with 30 min of aerobic exercise without signs/symptoms of physical distress. Continue with 30 min of aerobic exercise without signs/symptoms of physical distress. Continue with 30 min of aerobic exercise without signs/symptoms of physical distress. --   Intensity -- THRR unchanged THRR unchanged THRR unchanged --     Progression   Progression -- Continue to progress workloads to maintain intensity without signs/symptoms of physical distress. Continue to progress workloads to maintain intensity without signs/symptoms of physical distress. Continue to progress workloads to maintain intensity without signs/symptoms of physical distress. --     Paramedic Prescription -- Yes Yes Yes --   Weight -- 5 -- 6 --   Reps -- 10-15 10-15 10-15 --     Treadmill   MPH -- 2.5 2.7 2.7 --   Grade -- 2.5 3 3  --   Minutes -- 15 15 15  --  METs -- 3.78 4.19 4.19 --     REL-XR   Level -- 5 6 5  --   Speed -- 56 50 56 --   Minutes -- 15 15 15  --   METs -- 2.7 3.9 4.4 --     Home Exercise Plan   Plans to continue exercise at -- -- -- -- Home (comment)   Frequency -- -- -- -- Add 2 additional days to program exercise sessions.   Initial Home Exercises Provided -- -- -- -- 07/26/24    Row Name 09/09/24 1500             Response to Exercise   Blood Pressure (Admit) 142/90       Blood Pressure (Exit) 142/80       Heart Rate (Admit) 82 bpm       Heart Rate (Exercise) 103 bpm       Heart Rate (Exit) 89 bpm       Rating of Perceived Exertion (Exercise) 15       Duration Continue with 30 min of aerobic exercise without signs/symptoms of physical distress.       Intensity THRR unchanged         Progression   Progression Continue to progress workloads to maintain intensity without signs/symptoms of physical distress.         Resistance Training   Training Prescription Yes       Weight 7       Reps 10-15          Treadmill   MPH 3       Grade 3       Minutes 15       METs 4.54         REL-XR   Level 5       Speed 49       Minutes 15       METs 3.7         Home Exercise Plan   Plans to continue exercise at Home (comment)       Frequency Add 2 additional days to program exercise sessions.          Exercise Comments:   Exercise Comments     Row Name 05/22/24 [phone]           Exercise Comments First full day of exercise!  Patient was oriented to gym and equipment including functions, settings, policies, and procedures.  Patient's individual exercise prescription and treatment plan were reviewed.  All starting workloads were established based on the results of the 6 minute walk test done at initial orientation visit.  The plan for exercise progression was also introduced and progression will be customized based on patient's performance and goals.          Exercise Goals and Review:   Exercise Goals     Row Name 05/20/24 1122             Exercise Goals   Increase Physical Activity Yes       Intervention Provide advice, education, support and counseling about physical activity/exercise needs.;Develop an individualized exercise prescription for aerobic and resistive training based on initial evaluation findings, risk stratification, comorbidities and participant's personal goals.       Expected Outcomes Short Term: Attend rehab on a regular basis to increase amount of physical activity.;Long Term: Add in home exercise to make exercise part of routine and to increase amount of physical activity.;Long Term: Exercising regularly at least 3-5 days  a week.       Increase Strength and Stamina Yes       Intervention Provide advice, education, support and counseling about physical activity/exercise needs.;Develop an individualized exercise prescription for aerobic and resistive training based on initial evaluation findings, risk stratification, comorbidities and participant's personal goals.        Expected Outcomes Short Term: Increase workloads from initial exercise prescription for resistance, speed, and METs.;Long Term: Improve cardiorespiratory fitness, muscular endurance and strength as measured by increased METs and functional capacity ( );Short Term: Perform resistance training exercises routinely during rehab and add in resistance training at home       Able to understand and use rate of perceived exertion (RPE) scale Yes       Intervention Provide education and explanation on how to use RPE scale       Expected Outcomes Short Term: Able to use RPE daily in rehab to express subjective intensity level;Long Term:  Able to use RPE to guide intensity level when exercising independently       Able to understand and use Dyspnea scale Yes       Intervention Provide education and explanation on how to use Dyspnea scale       Expected Outcomes Short Term: Able to use Dyspnea scale daily in rehab to express subjective sense of shortness of breath during exertion;Long Term: Able to use Dyspnea scale to guide intensity level when exercising independently       Knowledge and understanding of Target Heart Rate Range (THRR) Yes       Intervention Provide education and explanation of THRR including how the numbers were predicted and where they are located for reference       Expected Outcomes Short Term: Able to state/look up THRR;Short Term: Able to use daily as guideline for intensity in rehab;Long Term: Able to use THRR to govern intensity when exercising independently       Able to check pulse independently Yes       Intervention Provide education and demonstration on how to check pulse in carotid and radial arteries.;Review the importance of being able to check your own pulse for safety during independent exercise       Expected Outcomes Short Term: Able to explain why pulse checking is important during independent exercise;Long Term: Able to check pulse independently and accurately        Understanding of Exercise Prescription Yes       Intervention Provide education, explanation, and written materials on patient's individual exercise prescription       Expected Outcomes Short Term: Able to explain program exercise prescription;Long Term: Able to explain home exercise prescription to exercise independently          Exercise Goals Re-Evaluation :  Exercise Goals Re-Evaluation     Row Name 06/17/24 0951 07/15/24 1045 08/07/24 1116 09/12/24 0930 09/13/24 0947     Exercise Goal Re-Evaluation   Exercise Goals Review Increase Physical Activity;Increase Strength and Stamina;Understanding of Exercise Prescription Increase Strength and Stamina;Increase Physical Activity Increase Physical Activity;Increase Strength and Stamina;Understanding of Exercise Prescription Increase Physical Activity;Increase Strength and Stamina;Understanding of Exercise Prescription Increase Physical Activity;Increase Strength and Stamina;Understanding of Exercise Prescription   Comments [name] states that he just joined the [facility] here in [place] and on the days he is not here, he goes there and swims in the pool. [name] states that on the days he is not here, he gos to the [facility] and swims in the pool. [name] is doing well  in rehab.  He is still going to [facility] to swim on his off days from rehab. He does feel like his stamina is getting better and it is helping him.  He is getting ready to start cancer treatments with both radiation and chemo.  He was encouraged to watch his fatigue and do his best to keep moving.  He is planning to schedule his sessions around rehab so that he can still attend and finish program. [name] has completed 29 sessions of CR. He has missed a few classes due to not feeling well due to cancer treatment. His level on the XR is 4 with RPE of 14 and his is walking at a speed of grade 3 on the treadmill. Will continue to monitor and progress as able [name] is doing well in rehab.  He is still going to  [facility] on his off days to swim.  His rest day is Sunday.  He is nearing graduation and we talked about what to do for exercise after graduation. He is gearing up for radiation therapy. He does have a stationary bike and we talked about the importance of continuing to exercise through treatments.   Expected Outcomes Short: Continue to attend rehab. Long:Incorporate more exercise at home. Short: Continue to attend rehab. Long:Incorporate more exercise at home. Short: Continue to exercise throughout cancer treatments Long: Continue to maintain strength Short: continue with levels and increase when feeling well   long: continue to attend rehab and exercise at home when feeling well Short: Improve post Long: Conitnue to make exercise a priority.    Row Name 09/27/24 1001             Exercise Goal Re-Evaluation   Exercise Goals Review Increase Strength and Stamina       Comments [name] is doing great in rehab. He starts chemo next week, so he is really hoping he can continue to make every class. He also talks about once he is done with the program he wants to continue to come and join the [facility] class.       Expected Outcomes Short: Improve post Long: Conitnue to make exercise a priority.           Discharge Exercise Prescription (Final Exercise Prescription Changes):  Exercise Prescription Changes - 09/09/24 1500       Response to Exercise   Blood Pressure (Admit) 142/90    Blood Pressure (Exit) 142/80    Heart Rate (Admit) 82 bpm    Heart Rate (Exercise) 103 bpm    Heart Rate (Exit) 89 bpm    Rating of Perceived Exertion (Exercise) 15    Duration Continue with 30 min of aerobic exercise without signs/symptoms of physical distress.    Intensity THRR unchanged      Progression   Progression Continue to progress workloads to maintain intensity without signs/symptoms of physical distress.      Resistance Training   Training Prescription Yes    Weight 7    Reps 10-15       Treadmill   MPH 3    Grade 3    Minutes 15    METs 4.54      REL-XR   Level 5    Speed 49    Minutes 15    METs 3.7      Home Exercise Plan   Plans to continue exercise at Home (comment)    Frequency Add 2 additional days to program exercise sessions.  Nutrition:  Target Goals: Understanding of nutrition guidelines, daily intake of sodium 1500mg , cholesterol 200mg , calories 30% from fat and 7% or less from saturated fats, daily to have 5 or more servings of fruits and vegetables.  Biometrics:  Pre Biometrics - 05/20/24 1122       Pre Biometrics   Height 6' 1 (1.854 m)    Weight 226 lb 8 oz (102.7 kg)    Waist Circumference 31 inches    Hip Circumference 44 inches    Waist to Hip Ratio 0.7 %    BMI (Calculated) 29.89    Grip Strength 32.7 kg    Single Leg Stand 28.2 seconds          Post Biometrics - 09/30/24 0943        Post  Biometrics   Height 6' 1 (1.854 m)    Weight 239 lb 4.8 oz (108.5 kg)    Waist Circumference 41 inches    Hip Circumference 44 inches    Waist to Hip Ratio 0.93 %    BMI (Calculated) 31.58    Grip Strength 30.2 kg    Single Leg Stand 30 seconds          Nutrition Therapy Plan and Nutrition Goals:   Nutrition Assessments:  MEDIFICTS Score Key: >=70 Need to make dietary changes  40-70 Heart Healthy Diet <= 40 Therapeutic Level Cholesterol Diet  Flowsheet Row Documentation from 09/23/2024 in [facility] CARDIAC REHABILITATION  Picture Your Plate Total Score on Discharge 57   Picture Your Plate Scores: <59 Unhealthy dietary pattern with much room for improvement. 41-50 Dietary pattern unlikely to meet recommendations for good health and room for improvement. 51-60 More healthful dietary pattern, with some room for improvement.  >60 Healthy dietary pattern, although there may be some specific behaviors that could be improved.    Nutrition Goals Re-Evaluation:  Nutrition Goals Re-Evaluation     Row Name  06/17/24 0947 07/15/24 1043 08/07/24 1123 09/13/24 0951 09/27/24 0953     Goals   Nutrition Goal Loose weight. Maintain weight. Short: Continue to attend rehab. Long: Follow the cancer center's diet, yet still following a heart healthy diet. Short; Increase protein Long: Continue to make sure he gets enough calories Short; Increase protein Long: Continue to make sure he gets enough calories   Comment [name] states he wants his goal weight to be 210-215 lbs. He is currently at 230 lbs. He states he eats lots of meat, tries to incorporate veggies and fruits. His water intake is adequate at 4-5 bottles per day, and he is an all day coffee drinker. Will drink 3 cups of caffeinated coffee and the rest of the day he will do de-caf. [name] states that his diet is currently ok. The cancer center told him he needed to try and gain some weight because once he starts treatments it till likely make him sick and loose weight. [name] is prepping to do a mostly liquid diet for a bit as he under goes cancer treatments.  He has his smoothie machine ready. They are going to pull his teeth which will also inhibit his eating. We talked about the importance of making sure he gets enough protein to avoid caexia as well. [name] is getting ready to start his cancer treatments with portacath being placed on 09/25/24. He is still trying to gain weight in prep for cancer treatments.  He is trying to get the protein in.  We talked about making sure he gets calories in  once he starts treatments.  He tries to get protein and vegetables.  He is sticking to fresh or frozen veggies. Bill starts chemo next week. He also had 17 teeth pulled last week so his mouth is pretty sore, he notes. [name] is eating softer foods due to that, but as of right now still eating good and eating enough protein and calories currently. He is a little concerned how that may change after treatments start.   Expected Outcome Short: Incorporate a more colorful diet, (fruits  and veggies). Long: Cut down on the caffeine. Short: Continue to attend rehab. Long: Follow the cancer center's diet, yet still following a heart healthy diet. Short; Increase protein Long: Continue to make sure he gets enough calories Short: Make sure to eat even with treatments LOng: Conitnue to focus on healthy eating Short: try to maintain protein and calorie intake through treatments. Long: eating healthly well balanced meals      Nutrition Goals Discharge (Final Nutrition Goals Re-Evaluation):  Nutrition Goals Re-Evaluation - 09/27/24 0953       Goals   Nutrition Goal Short; Increase protein Long: Continue to make sure he gets enough calories    Comment Bill starts chemo next week. He also had 17 teeth pulled last week so his mouth is pretty sore, he notes. [name] is eating softer foods due to that, but as of right now still eating good and eating enough protein and calories currently. He is a little concerned how that may change after treatments start.    Expected Outcome Short: try to maintain protein and calorie intake through treatments. Long: eating healthly well balanced meals          Psychosocial: Target Goals: Acknowledge presence or absence of significant depression and/or stress, maximize coping skills, provide positive support system. Participant is able to verbalize types and ability to use techniques and skills needed for reducing stress and depression.  Initial Review & Psychosocial Screening:  Initial Psych Review & Screening - 05/16/24 0944       Initial Review   Current issues with Current Sleep Concerns      Family Dynamics   Good Support System? Yes      Barriers   Psychosocial barriers to participate in program The patient should benefit from training in stress management and relaxation.;There are no identifiable barriers or psychosocial needs.      Screening Interventions   Interventions To provide support and resources with identified psychosocial  needs;Encouraged to exercise;Provide feedback about the scores to participant    Expected Outcomes Short Term goal: Utilizing psychosocial counselor, staff and physician to assist with identification of specific Stressors or current issues interfering with healing process. Setting desired goal for each stressor or current issue identified.;Long Term Goal: Stressors or current issues are controlled or eliminated.;Short Term goal: Identification and review with participant of any Quality of Life or Depression concerns found by scoring the questionnaire.;Long Term goal: The participant improves quality of Life and PHQ9 Scores as seen by post scores and/or verbalization of changes          Quality of Life Scores:  Quality of Life - 09/23/24 1144       Quality of Life   Select Quality of Life      Quality of Life Scores   Health/Function Pre 19.2 %    Health/Function Post 23.3 %    Health/Function % Change 21.35 %    Socioeconomic Pre 26.57 %    Socioeconomic Post 26.75 %  Socioeconomic % Change  0.68 %    Psych/Spiritual Pre 12.93 %    Psych/Spiritual Post 19.57 %    Psych/Spiritual % Change 51.35 %    Family Pre 27.5 %    Family Post 22.5 %    Family % Change -18.18 %    GLOBAL Pre 20.03 %    GLOBAL Post 23.05 %    GLOBAL % Change 15.08 %         Scores of 19 and below usually indicate a poorer quality of life in these areas.  A difference of  2-3 points is a clinically meaningful difference.  A difference of 2-3 points in the total score of the Quality of Life Index has been associated with significant improvement in overall quality of life, self-image, physical symptoms, and general health in studies assessing change in quality of life.  PHQ-9: Review Flowsheet  More data exists      10/10/2024 10/09/2024 10/03/2024 09/23/2024 08/07/2024  Depression screen PHQ 2/9  Decreased Interest 0 0 0 1 1  Down, Depressed, Hopeless 0 0 0 1 1  PHQ - 2 Score 0 0 0 2 2  Altered sleeping  - 0 0 2 1  Tired, decreased energy - 0 0 2 1  Change in appetite - 0 0 1 0  Feeling bad or failure about yourself  - 0 0 0 0  Trouble concentrating - 0 0 1 1  Moving slowly or fidgety/restless - 0 0 0 0  Suicidal thoughts - 0 0 0 0  PHQ-9 Score - 0  0  8  5   Difficult doing work/chores - Not difficult at all - Somewhat difficult Not difficult at all    Details       Data saved with a previous flowsheet row definition        Interpretation of Total Score  Total Score Depression Severity:  1-4 = Minimal depression, 5-9 = Mild depression, 10-14 = Moderate depression, 15-19 = Moderately severe depression, 20-27 = Severe depression   Psychosocial Evaluation and Intervention:  Psychosocial Evaluation - 05/16/24 0945       Psychosocial Evaluation & Interventions   Interventions Stress management education;Relaxation education;Encouraged to exercise with the program and follow exercise prescription    Comments 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 He denies any depression, anxiety or stressors. He does report some difficulty staying asleep and has been taking Advil PM long term for sleep. He lives alone. He says he has friends and neigbors that would help him out if needed. 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 He has a $25 copayment and is about to meet his out of pocket maximun which will end the copayment. He is only going to do 1 day/week until he meets the OOP and then go to 3 days/week. His goals for the program are to get his stamina back; improve his muscle tone and get back in shape overall. He has no barriers identified to complete the program.    Expected Outcomes Short Term: Patient will start the program and attend consistently. Long Term: Patient will complete the program meeting personal goals.    Continue Psychosocial Services  Follow up required by staff          Psychosocial Re-Evaluation:  Psychosocial  Re-Evaluation     Row Name 06/17/24 [id number] 07/15/24 1034 08/07/24 1119 09/13/24 0950 09/27/24 0948     Psychosocial Re-Evaluation  Current issues with None Identified Current Stress Concerns Current Stress Concerns;Current Sleep Concerns Current Stress Concerns;Current Sleep Concerns Current Stress Concerns;Current Sleep Concerns   Comments [name] identifies no stressors in his life at the moment. 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 [name] is currently stressed out and anxious about his cancer issues going on. He has his biopsy next week, so once they do that he will have a better idea of what the treatment options are and how long they will be. [name] is getting set up to start his cancer treatments.  He sees oncologist today to talk about chemo.  They are planning to do 7 weeks of radiation and chemo.  He has declined sugery.  He needs to have his teeth removed prior to treatments.  He is ready to face it head on as he says his alternative is to die.  He wants to take it on.  He does have a strong family history of cancer.  He has not been sleeping well with all of this going on.  Last night the anxiety triggered chest pain and his NTG did help with relief.  We talked about signs to be aware of and to watch for his fatigue.  He feels he is ready and will keep us  posted on how he is doing. [name] is having his teeth pulled next week. He is also getting fitted for his cage on 10/13 and portacath placed on 10/15.  He is gearing up mentally for the treatments. He has been fighting with some pain in his throat leading up to treatments.  He is still not sleeping well.  He tries to deal with it the best he can. Bill had his teeth pulled, 17 he says. He also had his port a cath placed 2 days ago. He says he is dealing with everythig fairly well. He starts chemo next week so he is preparing himself for that. He  seems to be in great spirits, says he's ready to get treatment started.   Expected Outcomes Short: Continue to attend rehab. Long: Continue to have a stress free lifestyle. Short: Continue to attend rehab. Long: Find stress outlets to help him with the anxiety he is experiencing. Short; Continue to attend rehab for mental boost Long: COntinue to cope with cancer treatments Short: Stay positive through treatments Long: Conitnue to rest when he can Short: staying positive through chemo/radiation. Long: continue to rest and relax   Interventions Encouraged to attend Cardiac Rehabilitation for the exercise Encouraged to attend Cardiac Rehabilitation for the exercise;Stress management education;Relaxation education Encouraged to attend Cardiac Rehabilitation for the exercise Encouraged to attend Cardiac Rehabilitation for the exercise Encouraged to attend Cardiac Rehabilitation for the exercise   Continue Psychosocial Services  Follow up required by staff Follow up required by staff Follow up required by staff Follow up required by staff Follow up required by staff      Psychosocial Discharge (Final Psychosocial Re-Evaluation):  Psychosocial Re-Evaluation - 09/27/24 0948       Psychosocial Re-Evaluation   Current issues with Current Stress Concerns;Current Sleep Concerns    Comments Bill had his teeth pulled, 17 he says. He also had his port a cath placed 2 days ago. He says he is dealing with everythig fairly well. He starts chemo next week so he is preparing himself for that. He seems to be  in great spirits, says he's ready to get treatment started.    Expected Outcomes Short: staying positive through chemo/radiation. Long: continue to rest and relax    Interventions Encouraged to attend Cardiac Rehabilitation for the exercise    Continue Psychosocial Services  Follow up required by staff          Vocational Rehabilitation: Provide vocational rehab assistance to qualifying candidates.    Vocational Rehab Evaluation & Intervention:  Vocational Rehab - 05/16/24 9061       Initial Vocational Rehab Evaluation & Intervention   Assessment shows need for Vocational Rehabilitation No      Vocational Rehab Re-Evaulation   Comments Disabled.          Education: Education Goals: Education classes will be provided on a weekly basis, covering required topics. Participant will state understanding/return demonstration of topics presented.  Learning Barriers/Preferences:  Learning Barriers/Preferences - 05/16/24 9061       Learning Barriers/Preferences   Learning Barriers None    Learning Preferences Written Material;Audio;Skilled Demonstration          Education Topics: Hypertension, Hypertension Reduction -Define heart disease and high blood pressure. Discus how high blood pressure affects the body and ways to reduce high blood pressure.   Exercise and Your Heart -Discuss why it is important to exercise, the FITT principles of exercise, normal and abnormal responses to exercise, and how to exercise safely.   Angina -Discuss definition of angina, causes of angina, treatment of angina, and how to decrease risk of having angina. Flowsheet Row CARDIAC REHAB PHASE II EXERCISE from 07/17/2024 in [place] [facility]  Date 05/22/24  Educator [facility]  Instruction Review Code 1- Verbalizes Understanding    Cardiac Medications -Review what the following cardiac medications are used for, how they affect the body, and side effects that may occur when taking the medications.  Medications include Aspirin , Beta blockers, calcium  channel blockers, ACE Inhibitors, angiotensin receptor blockers, diuretics, digoxin, and antihyperlipidemics. Flowsheet Row CARDIAC REHAB PHASE II EXERCISE from 07/17/2024 in [place] [facility]  Date 07/10/24  Educator [facility]  Instruction Review Code 1- Verbalizes Understanding    Congestive Heart Failure -Discuss the definition of  CHF, how to live with CHF, the signs and symptoms of CHF, and how keep track of weight and sodium intake.   Heart Disease and Intimacy -Discus the effect sexual activity has on the heart, how changes occur during intimacy as we age, and safety during sexual activity. Flowsheet Row CARDIAC REHAB PHASE II EXERCISE from 07/17/2024 in [place] [facility]  Date 06/12/24  Educator [facility]  Instruction Review Code 1- Verbalizes Understanding    Smoking Cessation / COPD -Discuss different methods to quit smoking, the health benefits of quitting smoking, and the definition of COPD. Flowsheet Row CARDIAC REHAB PHASE II EXERCISE from 07/17/2024 in [place] [facility]  Date 07/17/24  Educator [facility]  Instruction Review Code 1- Verbalizes Understanding    Nutrition I: Fats -Discuss the types of cholesterol, what cholesterol does to the heart, and how cholesterol levels can be controlled.   Nutrition II: Labels -Discuss the different components of food labels and how to read food label   Heart Parts/Heart Disease and PAD -Discuss the anatomy of the heart, the pathway of blood circulation through the heart, and these are affected by heart disease.   Stress I: Signs and Symptoms -Discuss the causes of stress, how stress may lead to anxiety and depression, and ways to limit stress.   Stress  II: Relaxation -Discuss different types of relaxation techniques to limit stress.   Warning Signs of Stroke / TIA -Discuss definition of a stroke, what the signs and symptoms are of a stroke, and how to identify when someone is having stroke.   Knowledge Questionnaire Score:  Knowledge Questionnaire Score - 09/23/24 1142       Knowledge Questionnaire Score   Post Score 25/28          Core Components/Risk Factors/Patient Goals at Admission:  Personal Goals and Risk Factors at Admission - 05/20/24 1123       Core Components/Risk Factors/Patient Goals on Admission    Weight  Management Weight Loss;Yes    Intervention Weight Management: Develop a combined nutrition and exercise program designed to reach desired caloric intake, while maintaining appropriate intake of nutrient and fiber, sodium and fats, and appropriate energy expenditure required for the weight goal.;Weight Management: Provide education and appropriate resources to help participant work on and attain dietary goals.;Weight Management/Obesity: Establish reasonable short term and long term weight goals.    Admit Weight 226 lb 8 oz (102.7 kg)    Goal Weight: Short Term 221 lb (100.2 kg)    Goal Weight: Long Term 216 lb (98 kg)    Expected Outcomes Short Term: Continue to assess and modify interventions until short term weight is achieved;Long Term: Adherence to nutrition and physical activity/exercise program aimed toward attainment of established weight goal;Weight Loss: Understanding of general recommendations for a balanced deficit meal plan, which promotes 1-2 lb weight loss per week and includes a negative energy balance of [phone] kcal/d;Understanding recommendations for meals to include 15-35% energy as protein, 25-35% energy from fat, 35-60% energy from carbohydrates, less than 200mg  of dietary cholesterol, 20-35 gm of total fiber daily;Understanding of distribution of calorie intake throughout the day with the consumption of 4-5 meals/snacks    Improve shortness of breath with ADL's Yes    Intervention Provide education, individualized exercise plan and daily activity instruction to help decrease symptoms of SOB with activities of daily living.    Expected Outcomes Short Term: Improve cardiorespiratory fitness to achieve a reduction of symptoms when performing ADLs;Long Term: Be able to perform more ADLs without symptoms or delay the onset of symptoms    Hypertension Yes    Intervention Provide education on lifestyle modifcations including regular physical activity/exercise, weight management, moderate  sodium restriction and increased consumption of fresh fruit, vegetables, and low fat dairy, alcohol moderation, and smoking cessation.;Monitor prescription use compliance.    Expected Outcomes Short Term: Continued assessment and intervention until BP is < 140/26mm HG in hypertensive participants. < 130/48mm HG in hypertensive participants with diabetes, heart failure or chronic kidney disease.;Long Term: Maintenance of blood pressure at goal levels.    Lipids Yes    Intervention Provide education and support for participant on nutrition & aerobic/resistive exercise along with prescribed medications to achieve LDL 70mg , HDL >40mg .    Expected Outcomes Short Term: Participant states understanding of desired cholesterol values and is compliant with medications prescribed. Participant is following exercise prescription and nutrition guidelines.;Long Term: Cholesterol controlled with medications as prescribed, with individualized exercise RX and with personalized nutrition plan. Value goals: LDL < 70mg , HDL > 40 mg.          Core Components/Risk Factors/Patient Goals Review:   Goals and Risk Factor Review     Row Name 06/17/24 0949 07/15/24 1044 08/07/24 1124 09/13/24 0954 09/27/24 0957     Core Components/Risk Factors/Patient Goals Review  Personal Goals Review Weight Management/Obesity;Lipids Weight Management/Obesity;Lipids;Hypertension Weight Management/Obesity;Lipids;Hypertension Weight Management/Obesity;Lipids;Hypertension Lipids;Hypertension   Review [name] states he checks all his vitals regularly at home. His BP runs good, usually only 4-10 points off of what we get. His pulse ox runs about 95-97% which is good for someone who used to be a heavy smoker. Takes all medications as prescribed. [name] states that he checks his BP 3-4 times a day. He also checks his pulse oximetry at home which it runs 96-97%, which he considers good since he smoked for 40 years. He takes all his medications as  prescribed. [name] is doing well in rehab.  He contineus to keep eye on blood pressre and encourage to really watch it as he starts up his cancer treatments. He is keeping eye on his weight and trying to gain a little bulk prior to starting treatments.  He was encouraged to talk with his oncologist to make sure that there are no medication interactions prior to starting his chemo. [name] has been working on weight gain to prep for cancer treatments that start this month.  His pressures have been running a little on the higher side with all the extra stress. He has also been having a chronic headache. He has tried a variety of things to help.  Sleep helps some. Bill's blood pressures have gotten better. Bill also noted his headaches were coming from a new glasses prescription, so he switched back to his old glasses and it is better. [name] will start chemo next week, port a cath was placed 2 days ago.   Expected Outcomes Short: Continue to attend rehab. Long: Continue checking vitals at home. Short: Continue to attend rehab. Long: Continue checking vitals at home. Short; Contineu to keep eye on blood pressure lOng: Continue to montior symptoms and weight Short; Continue to keep eye on blood pressures Long Continue to monitor risk factors Short: start chemo treatments, speak with MD if anything is bothering him. Long: continue to monitor health and speak with MD for any significant changes.      Core Components/Risk Factors/Patient Goals at Discharge (Final Review):   Goals and Risk Factor Review - 09/27/24 0957       Core Components/Risk Factors/Patient Goals Review   Personal Goals Review Lipids;Hypertension    Review Bill's blood pressures have gotten better. Bill also noted his headaches were coming from a new glasses prescription, so he switched back to his old glasses and it is better. [name] will start chemo next week, port a cath was placed 2 days ago.    Expected Outcomes Short: start chemo treatments,  speak with MD if anything is bothering him. Long: continue to monitor health and speak with MD for any significant changes.          ITP Comments:  ITP Comments     Row Name 05/16/24 [phone] 05/20/24 1143 05/22/24 0923 05/29/24 0928 06/24/24 0933   ITP Comments Virtual orientation visit completed for Cardiac rehab with S/P CABGx3. On-site orientation visit scheduled for 05/20/24 at 10. Patient arrived for 1st visit/orientation/education at 1000. Patient was referred to CR by Dr. Linnie Rayas and attending cardiologist Peter Jordan due to S/P CABGx3. During orientation advised patient on arrival and appointment times what to wear, what to do before, during and after exercise. Reviewed attendance and class policy.  Pt is scheduled to return Cardiac Rehab on 05/22/24 at 915. Pt was advised to come to class 15 minutes before class starts.  Discussed RPE/Dpysnea scales. Patient participated  in warm up stretches. Patient was able to complete 6 minute walk test.  Telemetry:NSR. Patient was measured for the equipment. Discussed equipment safety with patient. Took patient pre-anthropometric measurements. Patient finished visit at 1045. First full day of exercise!  Patient was oriented to gym and equipment including functions, settings, policies, and procedures.  Patient's individual exercise prescription and treatment plan were reviewed.  All starting workloads were established based on the results of the 6 minute walk test done at initial orientation visit.  The plan for exercise progression was also introduced and progression will be customized based on patient's performance and goals. 30 day review completed. ITP sent to Dr.Jonathan Branch, Medical Director of Cardiac Rehab. Continue with ITP unless changes are made by physician. New to program. He has completed 1 session since his orientation visit. He is only attending 1 day/week due to his co-payment. Patient will be out rest of week for oncology work up and  biopsy.    Row Name 06/26/24 0821 07/24/24 0951 08/09/24 0935 08/21/24 1417 09/13/24 0932   ITP Comments 30 day review completed. ITP sent to Dr. Dorn Ross, Medical Director of Cardiac Rehab. Continue with ITP unless changes are made by physician. 30 day review completed. ITP sent to Dr. Dorn Ross, Medical Director of Cardiac Rehab. Continue with ITP unless changes are made by physician.  [name] is out this week with MD appt Monday and biopsy surgery today. 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 30 day review completed. ITP sent to Dr. Dorn Ross, Medical Director of Cardiac Rehab. Continue with ITP unless changes are made by physician. [name] is having his teeth pulled next week.  He is also getting fitted for his cage on 10/13 and portacath placed on 10/15.    Row Name 09/18/24 0846 10/16/24 0759 11/12/24 0918       ITP Comments 30 day review completed. ITP sent to Dr. Dorn Ross, Medical Director of Cardiac Rehab. Continue with ITP unless changes are made by physician. 30 day review completed. ITP sent to Dr. Dorn Ross, Medical Director of Cardiac Rehab. Continue with ITP unless changes are made by physician. 30 day review completed. ITP sent to Dr. Dorn Ross, Medical Director of Cardiac Rehab. Continue with ITP unless changes are made by physician. Still has one more visit!  Non attendance due to current treatment schedule.        Comments: 30 day review

## 2024-11-13 ENCOUNTER — Ambulatory Visit

## 2024-11-13 NOTE — Progress Notes (Signed)
 Oncology Nurse Navigator Documentation   I received a VM from Mr. Heinle informing me that his car would not start this morning and he would be unable to make his radiation appointment at 10:15. I have notified the [facility].  [name] RN, BSN, OCN Head & Neck Oncology Nurse Navigator [facility] Phone # [phone]  Fax # [phone]

## 2024-11-13 NOTE — Therapy (Signed)
 OUTPATIENT SPEECH LANGUAGE PATHOLOGY ONCOLOGY TREATMENT   Patient Name: Dave Williams MRN: [id number] DOB:Sep 25, 1956, 68 y.o., male Today's Date: 11/14/2024  PCP: Myra Frieze, MD REFERRING PROVIDER: Izell Domino, MD  END OF SESSION:  End of Session - 11/14/24 1109     Visit Number 2    Number of Visits 3    Date for Recertification  01/15/25    SLP Start Time 1035    SLP Stop Time  1103    SLP Time Calculation (min) 28 min           Past Medical History:  Diagnosis Date   Anxiety    Chronic left hip pain    Compression fracture of spine (HCC)    multiple compression Fx with disc bulge   Coronary artery disease    Hypertension    Pneumonia    As a teenager   Stroke [facility]) 2022   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   Past Surgical History:  Procedure Laterality Date   BACK SURGERY     Radiocartography T2-T12   CORONARY ARTERY BYPASS GRAFT N/A 03/04/2024   Procedure: CORONARY ARTERY BYPASS GRAFTING (CABG) TIMES THREE UTILIZING LEFT INTERNAL MAMMARY ARTERY AND ENDOSCOPIC VEIN HARVEST RIGHT GREATER SAPHENOUS VEIN;  Surgeon: Shyrl Linnie KIDD, MD;  Location: [facility] OR;  Service: Open Heart Surgery;  Laterality: N/A;   DIRECT LARYNGOSCOPY N/A 07/24/2024   Procedure: LARYNGOSCOPY, DIRECT;  Surgeon: Tobie Eldora NOVAK, MD;  Location: [facility] OR;  Service: ENT;  Laterality: N/A;  Direct Laryngoscopy with biopsy with use of operating telescope   IR GASTROSTOMY TUBE MOD SED  11/05/2024   IR IMAGING GUIDED PORT INSERTION  09/25/2024   KNEE ARTHROSCOPY Bilateral    LEFT HEART CATH AND CORONARY ANGIOGRAPHY N/A 11/28/2023   Procedure: LEFT HEART CATH AND CORONARY ANGIOGRAPHY;  Surgeon: Jordan, Peter M, MD;  Location: [facility] INVASIVE CV LAB;  Service: Cardiovascular;  Laterality: N/A;   PILONIDAL CYST EXCISION     In patients 20s   RIGID ESOPHAGOSCOPY N/A 07/24/2024   Procedure: ESOPHAGOSCOPY, RIGID;  Surgeon: Tobie Eldora NOVAK,  MD;  Location: [facility] OR;  Service: ENT;  Laterality: N/A;   TEE WITHOUT CARDIOVERSION N/A 03/04/2024   Procedure: ECHOCARDIOGRAM, TRANSESOPHAGEAL;  Surgeon: Shyrl Linnie KIDD, MD;  Location: [facility] OR;  Service: Open Heart Surgery;  Laterality: N/A;   Patient Active Problem List   Diagnosis Date Noted   Situational insomnia 10/31/2024   Cancer associated pain 08/08/2024   Cancer of pyriform sinus (HCC) 08/07/2024   Mass of hypopharynx 07/24/2024   Positive colorectal cancer screening using Cologuard test 06/10/2024   PAF (paroxysmal atrial fibrillation) (HCC) 04/22/2024   Acute blood loss anemia 03/21/2024   Leucocytosis 03/21/2024   Urinary retention with incomplete bladder emptying 03/21/2024   Debility 03/13/2024   S/P CABG x 3 03/04/2024   CAD (coronary artery disease) 12/22/2023   Chest pain of uncertain etiology 11/27/2023   Cerebrovascular accident (HCC) 07/05/2023   History of hypertension 07/05/2023   Hypercholesterolemia 07/05/2023    ONSET DATE: See pertinent history below   REFERRING DIAG:  C13.9 (ICD-10-CM) - Squamous cell cancer of hypopharynx (HCC)    THERAPY DIAG:  Dysphagia, unspecified type  Rationale for Evaluation and Treatment: Rehabilitation  SUBJECTIVE:   SUBJECTIVE STATEMENT: PEG placed 11/05/24. Food is just too nasty now.  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 He presented to his PCP with complaints of a recurrent right neck mass. 06/26/24 He saw Dr. Tobie. He underwent a transnasal fiberoptic laryngoscopy indicating a hypopharynx mass involving the pyriform and likely arytenoid submucosally concerning for malignancy. 07/24/24 He underwent a right neck mass biopsy revealing Invasive SCC. 07/31/24 CT neck showed a 2 cm supraglottic mass to the right of midline consistent with a malignancy along with a bulky malignant lymphadenopathy on the right at level 2 and  level 3, with indistinct margins consistent with extra capsular spread. The upper level 2 node shows a diameter of 2.2 x 2.8 cm. The lower level 2 node measures approximately 28 x 17 mm. level 3 lymph node similarly with indistinct margins shows diameter of 21 x 19 mm. He followed up with Dr. Tobie and expressed no interest in surgical interventions. Dr. Izell 08/06/24 and Dr. Autumn 08/07/24. He will receive chemotherapy with radiation. Treatment plan: He will receive 35 fractions of radiation with weekly chemotherapy to his Laryngopharynx and bilateral neck which started on 10/03/24 and complete 11/25/24. Pretreatment procedures: 09/18/24 Extraction of all max teeth and posterior mand teeth and debridement performed. 09/25/24 PAC.   PAIN:  Are you having pain? Yes: NPRS scale: 710 Pain location: throat Pain description: sore Aggravating factors: dryness Relieving factors: meds  FALLS: Has patient fallen in last 6 months?  See PT evaluation for details   PATIENT GOALS: Maintain WNL swallowing  OBJECTIVE:  Note: Objective measures were completed at Evaluation unless otherwise noted                                                                                                                            TREATMENT DATE:   11/14/24: Pt is PEG dependent now and is only having water and coffee currently. SLP told pt to cont to drink coffee and water to avoid muscle disuse atrophy. No overt s/sx oropharyngeal dysphagia with water today. Pt is completing HEP with proper procedure but not at prescribed levels (12-15/day). SLP reiterated at LEAST 20/day now. Pt told SLP rationale for HEP. SLP educated pt about food journal and Bill told SLP benefits of this.   10/17/24: Research states the risk for dysphagia increases due to radiation and/or chemotherapy treatment due to a variety of factors, so SLP educated the pt about the possibility of reduced/limited ability for PO intake during rad tx. SLP also  educated pt regarding possible changes to swallowing musculature after rad tx, and why adherence to dysphagia HEP provided today and PO consumption was necessary to inhibit muscle fibrosis following rad tx and to mitigate muscle disuse atrophy. SLP informed pt why this would be detrimental to their swallowing status and to their pulmonary health. Pt demonstrated understanding of these things to SLP. SLP encouraged pt to safely eat and drink as deep into their radiation/chemotherapy as possible to provide the best possible long-term swallowing outcome for pt.  SLP then developed an  individualized HEP for pt involving oral and pharyngeal strengthening and ROM and pt was instructed how to perform these exercises, including SLP demonstration. After SLP demonstration, pt return demonstrated each exercise. SLP ensured pt performance was correct prior to educating pt on next exercise. Pt required usual min cues faded to modified independent to perform HEP. Pt was instructed to complete this program 5-7 days/week, at least 20 reps until 6 months after his last day of rad tx, and then x2 a week after that, indefinitely. Among other modifications for days when pt cannot functionally swallow, SLP also suggested pt to perform only non-swallowing tasks on the handout/HEP, and if necessary to cycle through the swallowing portion so the full program of exercises can be completed instead of fatiguing on one of the swallowing exercises and being unable to perform the other swallowing exercises. SLP instructed that swallowing exercises should then be added back into the regimen as pt is able to do so. Secondly, pt was told that former patients have told SLP that during their course of radiation therapy, taking prescribed pain medication just prior to performing HEP (and eating/drinking) has proven helpful in completing HEP (and eating and drinking) more regularly when going through their course of radiation treatment.    PATIENT  EDUCATION: Education details: modification to HEP when difficulty experienced with swallowing during and after radiation course Person educated: Patient Education method: Explanation Education comprehension: verbalized understanding, returned demonstration, and needs further education   ASSESSMENT:  CLINICAL IMPRESSION: Patient is a 68 y.o. M who was seen today for treatment of swallowing as they undergo radiation/chemoradiation therapy. Today pt politely declined solid food but drank thin liquids without overt s/s oral or pharyngeal difficulty. At this time pt swallowing is deemed WNL/WFL with these POs. No oral or overt s/sx pharyngeal deficits, including aspiration were observed. There are no overt s/s aspiration PNA observed by SLP nor any reported by pt at this time. Data indicate that pt's swallow ability will likely decrease over the course of radiation/chemoradiation therapy and could very well decline over time following the conclusion of that therapy due to muscle disuse atrophy and/or muscle fibrosis. Pt will cont to need to be seen by SLP in order to assess safety of PO intake, assess the need for recommending any objective swallow assessment, and ensuring pt is correctly completing the individualized HEP.   OBJECTIVE IMPAIRMENTS: include dysphagia. These impairments are limiting patient from safety when swallowing. Factors affecting potential to achieve goals and functional outcome are none noted today. Patient will benefit from skilled SLP services to address above impairments and improve overall function.   REHAB POTENTIAL: Good     GOALS: Goals reviewed with patient? No   SHORT TERM GOALS: Target: 3rd total session   1. Pt will complete HEP with modified independence in 2 sessions Baseline:11/14/24 Goal status: INITIAL   2.  pt will tell SLP why pt is completing HEP with modified independence Baseline:  Goal status: met   3.  pt will describe 3 overt s/s aspiration PNA  with modified independence Baseline:  Goal status: INITIAL   4.  pt will tell SLP how a food journal could hasten return to a more normalized diet Baseline:  Goal status: met     LONG TERM GOALS: Target: 7th total session   1.  pt will complete HEP with independence over two visits Baseline:  Goal status: INITIAL   2.  pt will describe how to modify HEP over time, and the timeline  associated with reduction in HEP frequency with modified independence over two sessions Baseline:  Goal status: INITIAL     PLAN:   SLP FREQUENCY:  once approx every 4 weeks   SLP DURATION:  7 sessions   PLANNED INTERVENTIONS: Aspiration precaution training, Pharyngeal strengthening exercises, Diet toleration management , Trials of upgraded texture/liquids, SLP instruction and feedback, Compensatory strategies, and Patient/family education, [phone] (treatment of swallowing dysfunction and/or oral function for feeding)  Arty Lantzy, CCC-SLP 11/14/2024, 11:10 AM

## 2024-11-14 ENCOUNTER — Inpatient Hospital Stay: Admitting: Dietician

## 2024-11-14 ENCOUNTER — Ambulatory Visit
Admission: RE | Admit: 2024-11-14 | Discharge: 2024-11-14 | Disposition: A | Source: Ambulatory Visit | Attending: Radiation Oncology

## 2024-11-14 ENCOUNTER — Inpatient Hospital Stay

## 2024-11-14 ENCOUNTER — Other Ambulatory Visit: Payer: Self-pay

## 2024-11-14 ENCOUNTER — Inpatient Hospital Stay (HOSPITAL_BASED_OUTPATIENT_CLINIC_OR_DEPARTMENT_OTHER): Admitting: Oncology

## 2024-11-14 ENCOUNTER — Ambulatory Visit: Attending: Radiation Oncology

## 2024-11-14 VITALS — BP 106/66 | HR 89 | Temp 98.7°F | Resp 17 | Ht 73.0 in | Wt 224.0 lb

## 2024-11-14 DIAGNOSIS — Z931 Gastrostomy status: Secondary | ICD-10-CM | POA: Insufficient documentation

## 2024-11-14 DIAGNOSIS — T451X5A Adverse effect of antineoplastic and immunosuppressive drugs, initial encounter: Secondary | ICD-10-CM | POA: Insufficient documentation

## 2024-11-14 DIAGNOSIS — R530 Neoplastic (malignant) related fatigue: Secondary | ICD-10-CM | POA: Insufficient documentation

## 2024-11-14 DIAGNOSIS — Z5189 Encounter for other specified aftercare: Secondary | ICD-10-CM | POA: Insufficient documentation

## 2024-11-14 DIAGNOSIS — C12 Malignant neoplasm of pyriform sinus: Secondary | ICD-10-CM

## 2024-11-14 DIAGNOSIS — Z5111 Encounter for antineoplastic chemotherapy: Secondary | ICD-10-CM | POA: Insufficient documentation

## 2024-11-14 DIAGNOSIS — G893 Neoplasm related pain (acute) (chronic): Secondary | ICD-10-CM | POA: Diagnosis not present

## 2024-11-14 DIAGNOSIS — D6959 Other secondary thrombocytopenia: Secondary | ICD-10-CM | POA: Insufficient documentation

## 2024-11-14 DIAGNOSIS — Z79891 Long term (current) use of opiate analgesic: Secondary | ICD-10-CM | POA: Insufficient documentation

## 2024-11-14 DIAGNOSIS — R1310 Dysphagia, unspecified: Secondary | ICD-10-CM | POA: Diagnosis present

## 2024-11-14 DIAGNOSIS — D701 Agranulocytosis secondary to cancer chemotherapy: Secondary | ICD-10-CM | POA: Insufficient documentation

## 2024-11-14 DIAGNOSIS — Z800 Family history of malignant neoplasm of digestive organs: Secondary | ICD-10-CM | POA: Insufficient documentation

## 2024-11-14 DIAGNOSIS — R066 Hiccough: Secondary | ICD-10-CM | POA: Insufficient documentation

## 2024-11-14 DIAGNOSIS — Z808 Family history of malignant neoplasm of other organs or systems: Secondary | ICD-10-CM | POA: Insufficient documentation

## 2024-11-14 DIAGNOSIS — Z510 Encounter for antineoplastic radiation therapy: Secondary | ICD-10-CM | POA: Diagnosis not present

## 2024-11-14 DIAGNOSIS — J387 Other diseases of larynx: Secondary | ICD-10-CM

## 2024-11-14 LAB — BASIC METABOLIC PANEL - CANCER CENTER ONLY
Anion gap: 7 (ref 5–15)
BUN: 13 mg/dL (ref 8–23)
CO2: 29 mmol/L (ref 22–32)
Calcium: 9.4 mg/dL (ref 8.9–10.3)
Chloride: 99 mmol/L (ref 98–111)
Creatinine: 0.81 mg/dL (ref 0.61–1.24)
GFR, Estimated: >60 mL/min (ref >60)
Glucose, Bld: 159 mg/dL — ABNORMAL HIGH (ref 70–99)
Potassium: 4.2 mmol/L (ref 3.5–5.1)
Sodium: 135 mmol/L (ref 135–145)

## 2024-11-14 LAB — RAD ONC ARIA SESSION SUMMARY
Course Elapsed Days: 42
Plan Fractions Treated to Date: 24
Plan Prescribed Dose Per Fraction: 2 Gy
Plan Total Fractions Prescribed: 35
Plan Total Prescribed Dose: 70 Gy
Reference Point Dosage Given to Date: 48 Gy
Reference Point Session Dosage Given: 2 Gy
Session Number: 24

## 2024-11-14 LAB — CBC WITH DIFFERENTIAL (CANCER CENTER ONLY)
Abs Immature Granulocytes: 0 K/uL (ref 0.00–0.07)
Basophils Absolute: 0 K/uL (ref 0.0–0.1)
Basophils Relative: 0 %
Eosinophils Absolute: 0 K/uL (ref 0.0–0.5)
Eosinophils Relative: 0 %
HCT: 30.4 % — ABNORMAL LOW (ref 39.0–52.0)
Hemoglobin: 10.6 g/dL — ABNORMAL LOW (ref 13.0–17.0)
Immature Granulocytes: 0 %
Lymphocytes Relative: 9 %
Lymphs Abs: 0.3 K/uL — ABNORMAL LOW (ref 0.7–4.0)
MCH: 30.9 pg (ref 26.0–34.0)
MCHC: 34.9 g/dL (ref 30.0–36.0)
MCV: 88.6 fL (ref 80.0–100.0)
Monocytes Absolute: 0.2 K/uL (ref 0.1–1.0)
Monocytes Relative: 8 %
Neutro Abs: 2.2 K/uL (ref 1.7–7.7)
Neutrophils Relative %: 83 %
Platelet Count: 55 K/uL — ABNORMAL LOW (ref 150–400)
RBC: 3.43 MIL/uL — ABNORMAL LOW (ref 4.22–5.81)
RDW: 13.3 % (ref 11.5–15.5)
WBC Count: 2.7 K/uL — ABNORMAL LOW (ref 4.0–10.5)
nRBC: 0 % (ref 0.0–0.2)

## 2024-11-14 LAB — MAGNESIUM: Magnesium: 1.9 mg/dL (ref 1.7–2.4)

## 2024-11-14 LAB — PHOSPHORUS: Phosphorus: 2.2 mg/dL — ABNORMAL LOW (ref 2.5–4.6)

## 2024-11-14 MED ORDER — HYDROCODONE-ACETAMINOPHEN 7.5-325 MG PO TABS: 1.0000 | ORAL_TABLET | Freq: Four times a day (QID) | ORAL | 0 refills | Status: DC | PRN

## 2024-11-14 NOTE — Progress Notes (Unsigned)
 [facility]  ONCOLOGY CLINIC PROGRESS NOTE   Patient Care Team: Myra Geni ORN, FNP as PCP - General (Family Medicine) Court Dorn PARAS, MD as PCP - Cardiology (Cardiology) Malmfelt, Delon CROME, RN as Oncology Nurse Navigator Izell Domino, MD as Consulting Physician (Radiation Oncology) Autumn Millman, MD as Consulting Physician (Oncology) Tobie Eldora NOVAK, MD as Consulting Physician (Otolaryngology)  PATIENT NAME: Dave Williams   MR#: [id number] DOB: Feb 16, 1956  Date of visit: 11/14/2024   ASSESSMENT & PLAN:   Zailyn Rowser is a 68 y.o. 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   No problem-specific Assessment & Plan notes found for this encounter.  Assessment and Plan Assessment & Plan    I reviewed lab results and outside records for this visit and discussed relevant results with the patient. Diagnosis, plan of care and treatment options were also discussed in detail with the patient. Opportunity provided to ask questions and answers provided to his apparent satisfaction. Provided instructions to call our clinic with any problems, questions or concerns prior to return visit. I recommended to continue follow-up with PCP and sub-specialists. He verbalized understanding and agreed with the plan.   NCCN guidelines have been consulted in the planning of this patient's care.  I spent a total of 42 minutes during this encounter with the patient including review of chart and various tests results, discussions about plan of care and coordination of care plan.   Millman Autumn, MD  11/14/2024 10:06 AM  [facility] - A DEPT OF [facility] [address] [place] [address] Dept: [phone] Dept Fax: [phone]    CHIEF COMPLAINT/ REASON FOR VISIT:   Squamous cell carcinoma of the pyriform sinus, cT2, cN3, cM0, stage IV B.   Current Treatment:  Concurrent chemoradiation with weekly cisplatin  started from 10/03/2024  INTERVAL HISTORY:    Discussed the use of AI scribe software for clinical note transcription with the patient, who gave verbal consent to proceed.  History of Present Illness Dave Williams is a 68 year old male undergoing chemotherapy and radiation therapy who presents with weight loss and sleep disturbances.  He is experiencing significant weight loss, which he attributes to the combined effects of chemotherapy and radiation therapy. Food tastes like 'slime or metal,' making it difficult to eat, despite efforts to consume instant breakfasts. He anticipated losing 20 to 25 pounds, having gained 30 pounds prior to starting treatment. He is staying hydrated, drinking at least four to five bottles of water daily.  He is experiencing sleep disturbances, getting only two and a half to three hours of sleep at night. He has tried Tylenol  PM and Advil PM without success. He needs rest, as he sleeps for two hours and then remains awake for four hours. He has a history of sleeping through the night prior to his heart surgery and current treatments.  He discusses transportation challenges, particularly on chemotherapy days, as he does not feel safe driving due to potential medical emergencies. He missed an appointment because he could not find someone to drive him and did not want to risk driving himself.  His constipation has resolved and is no longer an issue.  I have reviewed the past medical history, past surgical history, social history and family history with the patient and they are unchanged from previous note.  HISTORY OF PRESENT ILLNESS:   ONCOLOGY HISTORY:   He  presented to his PCP with complains of a recurrent right neck mass.   Patient had US  neck on 06/05/24 showing enlarged lymph nodes in right superior cervical chain measuring 5.1 x 2.1 x 3.0 cm and 2.5 x 1.4 x 1.6 cm. CT soft tissue neck was performed on 06/16/24 showed a 2 cm supraglottic mass to the right of midline consistent with a malignancy and bulky malignant lymphadenopathy on the right at level 2 and level 3, with indistinct margins consistent with extra capsular spread. The upper level 2 node shows a diameter of 2.2 x 2.8 cm. The lower level 2 node measures approximately 28 x 17 mm. Level 3 lymph node similarly with indistinct margins shows diameter of 21 x 19 mm.    His PCP referred him to our clinic for further evaluation of neck lymphadenopathy.  He was seen in our rapid diagnostic clinic on 06/19/2024.  CBCD was unremarkable.  Flow cytometry of peripheral blood was also unremarkable.  ESR, CRP, LDH were within normal limits.  HIV, hepatitis panel testing was negative.   To further investigate his symptoms, he was referred to Dr. Tobie on 06/26/24. At that time, patient recalled noticing a right neck mass last year that eventually resolved on its own. However, in January 2025, mass had reoccurred and had increased in size, causing  discomfort in the area.    He then underwent a Transnasal Fiberoptic Laryngoscopy indicating a hypopharynx neck mass involving the pyriform and likely arytenoid submucosally concerning for malignancy. Vocal folds were mobile bilaterally. No lesions on the free edge of the vocal folds. No airway compromise concern   Subsequently, he underwent a right neck mass biopsy on 07/24/24 which revealed invasive squamous cell carcinoma.         He presented for a follow up with Dr. Tobie on 07/31/24 to discuss further treatment options. Patient expressed no interest in surgical interventions. He would like to consider chemoradiation.    PET scan performed on 08/01/24 showed the  right eccentric supraglottic mass has a maximum SUV of 6.9 along with hypermetabolic activity in all identified lymph nodes. Scan also noted several scattered pulmonary nodules with the largest measuring 0.4 x 0.5 cm with surveillance recommended as nodules are small in size to detect hypermetabolic activity.     Family history includes his father having esophageal cancer that metastasized to the spine, his mother having skin and anal cancer, and his sister having thyroid  cancer.    He had consultation with Dr. Izell on 08/06/2024.  Plan made for 7 weeks of radiation therapy and he was referred to us  for concurrent chemotherapy.   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  Patient declined surgical option.  Hence plan made to proceed with concurrent chemoradiation using weekly cisplatin .  Renal function is fluctuating and this needs to be monitored closely.   Further chart review shows patient established care with GI on 06/10/24 after having a positive cologuard test. It was recommended patient have colonoscopy however he prefers to hold off while undergoing oncology work up. GI will reach out to patient in 2 months to schedule.   Started concurrent chemoradiation with weekly cisplatin  from 10/03/2024.  Oncology History  Cancer of pyriform sinus (HCC)  08/07/2024 Initial  Diagnosis   Cancer of pyriform sinus (HCC)   08/07/2024 Cancer Staging   Staging form: Pharynx - Hypopharynx, AJCC 8th Edition - Clinical stage from 08/07/2024: Stage IVB (cT2, cN3, cM0) - Signed by Izell Domino, MD on 08/07/2024 Stage prefix: Initial diagnosis   10/03/2024 -  Chemotherapy   Patient is on Treatment Plan : HEAD/NECK Cisplatin  (40) q7d         REVIEW OF SYSTEMS:   Review of Systems - Oncology  All other pertinent systems were reviewed with the patient and are negative.  ALLERGIES: He is allergic to prednisone .  MEDICATIONS:  Current Outpatient Medications   Medication Sig Dispense Refill   acetaminophen  (TYLENOL ) 500 MG tablet Take 1,000 mg by mouth every 6 (six) hours as needed (pain.).     ALPRAZolam  (XANAX ) 0.5 MG tablet Take 1 tablet (0.5 mg total) by mouth at bedtime as needed for anxiety or sleep. 30 tablet 0   amLODipine  (NORVASC ) 10 MG tablet Take 1 tablet (10 mg total) by mouth daily. 90 tablet 2   aspirin  EC 81 MG tablet Take 1 tablet (81 mg total) by mouth daily. Swallow whole.     atorvastatin  (LIPITOR ) 80 MG tablet Take 1 tablet (80 mg total) by mouth daily with supper. 90 tablet 3   isosorbide  mononitrate (IMDUR ) 30 MG 24 hr tablet Take 1 tablet (30 mg total) by mouth daily. 90 tablet 1   lidocaine  (XYLOCAINE ) 2 % solution Patient: Mix 1part 2% viscous lidocaine , 1part H20. Swish & swallow 10mL of diluted mixture, 30min before meals and at bedtime, up to QID 200 mL 3   lidocaine -prilocaine  (EMLA ) cream Apply to affected area once 30 g 3   nitroGLYCERIN  (NITROSTAT ) 0.4 MG SL tablet DISSOLVE 1 TABLET UNDER THE TONGUE EVERY 5 MINUTES AS NEEDED FOR CHEST PAIN. DO NOT EXCEED A TOTAL OF 3 DOSES IN 15 MINUTES. 25 tablet 6   Nutritional Supplements (NUTREN 1.5) LIQD 2 cartons Nutren 1.5 via G-tube 4 times daily with 60 mL free water before and after each bolus feeding.  Give additional 300 mL water 3 times daily between feeds.  Provides 3000 cal, 136 g protein, 2908 mL free water./100% estimated needs. 1896 mL 12   ondansetron  (ZOFRAN ) 8 MG tablet Take 1 tablet (8 mg total) by mouth every 8 (eight) hours as needed for nausea or vomiting. Start on the third day after cisplatin . 30 tablet 1   pantoprazole  (PROTONIX ) 40 MG tablet Take 1 tablet (40 mg total) by mouth daily. 30 tablet 0   prochlorperazine  (COMPAZINE ) 10 MG tablet Take 1 tablet (10 mg total) by mouth every 6 (six) hours as needed (Nausea or vomiting). 30 tablet 1   vitamin D3 (CHOLECALCIFEROL ) 25 MCG tablet Take 1 tablet (1,000 Units total) by mouth daily. 30 tablet 0    HYDROcodone -acetaminophen  (NORCO) 7.5-325 MG tablet Take 1 tablet by mouth every 6 (six) hours as needed for moderate pain (pain score 4-6). 60 tablet 0   No current facility-administered medications for this visit.     VITALS:   Blood pressure 106/66, pulse 89, temperature 98.7 F (37.1 C), temperature source Temporal, resp. rate 17, height 6' 1 (1.854 m), weight 224 lb (101.6 kg), SpO2 97%.  Wt Readings from Last 3 Encounters:  11/14/24 224 lb (101.6 kg)  11/06/24 219 lb 8 oz (99.6 kg)  11/05/24 224 lb (101.6 kg)    Body mass index is 29.55 kg/m.    Onc Performance Status - 11/14/24 0941  ECOG Perf Status   ECOG Perf Status Restricted in physically strenuous activity but ambulatory and able to carry out work of a light or sedentary nature, e.g., light house work, office work      KPS SCALE   KPS % SCORE Normal activity with effort, some s/s of disease           PHYSICAL EXAM:   Physical Exam Constitutional:      General: He is not in acute distress.    Appearance: Normal appearance.  HENT:     Head: Normocephalic and atraumatic.  Eyes:     Conjunctiva/sclera: Conjunctivae normal.  Cardiovascular:     Rate and Rhythm: Normal rate and regular rhythm.  Pulmonary:     Effort: Pulmonary effort is normal. No respiratory distress.  Chest:     Comments: Port-A-Cath in place without signs of infection. Abdominal:     General: There is no distension.  Lymphadenopathy:     Cervical: Cervical adenopathy (Right Level 2/3 mass, fixed, ~ 5cm, less firm compared to prior) present.  Neurological:     General: No focal deficit present.     Mental Status: He is alert and oriented to person, place, and time.  Psychiatric:        Mood and Affect: Mood normal.        Behavior: Behavior normal.      LABORATORY DATA:   I have reviewed the data as listed.  Results for orders placed or performed in visit on 11/14/24  Magnesium   Result Value Ref Range   Magnesium  1.9  1.7 - 2.4 mg/dL  Basic Metabolic Panel - [facility] Only  Result Value Ref Range   Sodium 135 135 - 145 mmol/L   Potassium 4.2 3.5 - 5.1 mmol/L   Chloride 99 98 - 111 mmol/L   CO2 29 22 - 32 mmol/L   Glucose, Bld 159 (H) 70 - 99 mg/dL   BUN 13 8 - 23 mg/dL   Creatinine 9.18 9.38 - 1.24 mg/dL   Calcium  9.4 8.9 - 10.3 mg/dL   GFR, Estimated >39 >39 mL/min   Anion gap 7 5 - 15  CBC with Differential ([facility] Only)  Result Value Ref Range   WBC Count 2.7 (L) 4.0 - 10.5 K/uL   RBC 3.43 (L) 4.22 - 5.81 MIL/uL   Hemoglobin 10.6 (L) 13.0 - 17.0 g/dL   HCT 69.5 (L) 60.9 - 47.9 %   MCV 88.6 80.0 - 100.0 fL   MCH 30.9 26.0 - 34.0 pg   MCHC 34.9 30.0 - 36.0 g/dL   RDW 86.6 88.4 - 84.4 %   Platelet Count 55 (L) 150 - 400 K/uL   nRBC 0.0 0.0 - 0.2 %   Neutrophils Relative % 83 %   Neutro Abs 2.2 1.7 - 7.7 K/uL   Lymphocytes Relative 9 %   Lymphs Abs 0.3 (L) 0.7 - 4.0 K/uL   Monocytes Relative 8 %   Monocytes Absolute 0.2 0.1 - 1.0 K/uL   Eosinophils Relative 0 %   Eosinophils Absolute 0.0 0.0 - 0.5 K/uL   Basophils Relative 0 %   Basophils Absolute 0.0 0.0 - 0.1 K/uL   Immature Granulocytes 0 %   Abs Immature Granulocytes 0.00 0.00 - 0.07 K/uL      RADIOGRAPHIC STUDIES:  I have personally reviewed the radiological images as listed and agree with the findings in the report.  IR GASTROSTOMY TUBE MOD SED Result Date: 11/05/2024 INDICATION: 68 year old male with history  of head neck cancer requiring percutaneous gastrostomy access for supplemental nutrition. EXAM: PERC PLACEMENT GASTROSTOMY MEDICATIONS: Ancef  2 gm IV; Antibiotics were administered within 1 hour of the procedure. ANESTHESIA/SEDATION: Versed  4 mg IV; Fentanyl  100 mcg IV Moderate Sedation Time:  19 The patient was continuously monitored during the procedure by the interventional radiology nurse under my direct supervision. CONTRAST:  15mL OMNIPAQUE  IOHEXOL  300 MG/ML SOLN - administered into the gastric lumen.  FLUOROSCOPY TIME:  Six mGy reference air kerma COMPLICATIONS: None immediate. PROCEDURE: Informed written consent was obtained from the patient after a thorough discussion of the procedural risks, benefits and alternatives. All questions were addressed. Maximal Sterile barrier Technique was utilized including caps, mask, sterile gowns, sterile gloves, sterile drape, hand hygiene and skin antiseptic. A timeout was performed prior to the initiation of the procedure. The patient was placed on the procedure table in the supine position. Pre-procedure abdominal film confirmed visualization of the transverse colon. An angled 5-French catheter was passed through the nares into the stomach. The patient was prepped and draped in usual sterile fashion. The stomach was insufflated with air via the indwelling nasogastric tube. Under fluoroscopy, a puncture site was selected and local analgesia achieved with 1% lidocaine  infiltrated subcutaneously. Under fluoroscopic guidance, a gastropexy needle was passed into the stomach and the T-bar suture was released. Entry into the stomach was confirmed with fluoroscopy, aspiration of air, and injection of contrast material. This was repeated with an additional gastropexy suture (for a total of 2 fasteners). At the [facility] of these gastropexy sutures, a dermatotomy was performed. An 18 gauge needle was passed into the stomach at the site of this dermatotomy, and position within the gastric lumen again confirmed under fluoroscopy using aspiration of air and contrast injection. An Amplatz guidewire was passed through this needle and intraluminal placement within the stomach was confirmed by fluoroscopy. The needle was removed. Over the guidewire, the percutaneous tract was dilated using a 10 mm non-compliant balloon. The balloon was deflated, then pushed into the gastric lumen followed in concert by the 20 Fr gastrostomy tube. The retention balloon of the percutaneous gastrostomy tube was  inflated with 10 mL of sterile water. The tube was withdrawn until the retention balloon was at the edge of the gastric lumen. The external bumper was brought to the abdominal wall. Contrast was injected through the gastrostomy tube, confirming intraluminal positioning. The patient tolerated the procedure well without any immediate post-procedural complications. IMPRESSION: Technically successful placement of 20 Fr gastrostomy tube. Ester Sides, MD [facility] Electronically Signed   By: Ester Sides M.D.   On: 11/05/2024 18:15      CODE STATUS:  Code Status History     Date Active Date Inactive Code Status Order ID Comments User Context   09/25/2024 1523 09/26/2024 0524 Full Code [id number]  Jenna Cordella LABOR, MD HOV   03/13/2024 1550 03/21/2024 1532 Full Code [id number]  Maurice Sharlet GORMAN DEVONNA Inpatient   03/04/2024 1247 03/13/2024 1548 Full Code [id number]  Viviane Lemond BRAVO, PA-C Inpatient   11/28/2023 1625 11/28/2023 2356 Full Code [id number]  Jordan, Peter M, MD Inpatient    Questions for Most Recent Historical Code Status (Order [id number])     Question Answer   By: Consent: discussion documented in EHR            Orders Placed This Encounter  Procedures   CBC with Differential ([facility] Only)    Standing Status:   Future  Expected Date:   11/21/2024    Expiration Date:   02/19/2025   Basic Metabolic Panel - [facility] Only    Standing Status:   Future    Expected Date:   11/21/2024    Expiration Date:   02/19/2025   Magnesium     Standing Status:   Future    Expected Date:   11/21/2024    Expiration Date:   02/19/2025     Future Appointments  Date Time Provider Department [facility]  11/14/2024 10:15 AM [facility] LINAC 4 [facility] None  11/14/2024 10:30 AM Jacelyn Lupita NOVAK, CCC-SLP [facility]  11/15/2024 10:15 AM [facility] LINAC 4 [facility] None  11/18/2024 10:15 AM [facility] LINAC 4 [facility] None   11/18/2024 10:30 AM LINAC-SQUIRE [facility] None  11/19/2024 10:15 AM [facility] LINAC 4 [facility] None  11/19/2024 10:30 AM [facility] INFUSION [facility] None  11/19/2024 11:15 AM Ivonne Harlene RAMAN, RD [facility] None  11/20/2024 10:15 AM [facility] LINAC 4 [facility] None  11/21/2024 10:15 AM [facility] LINAC 4 [facility] None  11/22/2024 10:15 AM [facility] LINAC 4 [facility] None  11/25/2024 10:15 AM [facility] LINAC 4 [facility] None  11/26/2024 10:15 AM [facility] LINAC 4 [facility] None  11/27/2024 10:15 AM [facility] LINAC 4 [facility] None  11/28/2024 10:15 AM [facility] LINAC 4 [facility] None  11/29/2024  1:15 PM [facility] LINAC 4 [facility] None  12/19/2024 10:00 AM Breedlove Blue, Blaire L, PT [facility] None  01/22/2025 10:30 AM Rana Lum CROME, NP [facility]     This document was completed utilizing speech recognition software. Grammatical errors, random word insertions, pronoun errors, and incomplete sentences are an occasional consequence of this system due to software limitations, ambient noise, and hardware issues. Any formal questions or concerns about the content, text or information contained within the body of this dictation should be directly addressed to the provider for clarification.

## 2024-11-15 ENCOUNTER — Inpatient Hospital Stay: Admitting: Dietician

## 2024-11-15 ENCOUNTER — Inpatient Hospital Stay

## 2024-11-15 ENCOUNTER — Encounter: Payer: Self-pay | Admitting: Oncology

## 2024-11-15 ENCOUNTER — Ambulatory Visit

## 2024-11-15 NOTE — Assessment & Plan Note (Signed)
 Please review oncology history for additional details and timeline of events.  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  Patient declined surgical option.  Hence plan made to proceed with concurrent chemoradiation.  Renal function is fluctuating and this needs to be monitored closely. The cancer has a high risk of recurrence, necessitating aggressive treatment.  We have discussed about role of cisplatin  being a radiosensitizer in the treatment of head and neck cancer.  We have discussed about the curative intent of chemoradiation for this patient.  Patient was willing to proceed with weekly cisplatin .  Concurrent chemoradiation with weekly cisplatin  started from 10/03/2024.  He has been tolerating chemotherapy reasonably well.   Previously he wanted to defer feeding tube placement but because of progressive weight loss, recently he underwent feeding tube placement and he is using it as directed.  Due for cycle 7 of chemotherapy with cisplatin  on 11/19/2024.   Labs today however showed cytopenias with white count of 2700, ANC of 2200, platelet count decreased at 55,000 and hemoglobin decreased at 10.6.  His last scheduled radiation session is on 11/29/2024.  Hence we will defer chemotherapy on 11/19/2024 and I will reevaluate him in clinic on 11/22/2024.  Hopefully with the break, his blood counts would improve.  - Encourage hydration to maintain kidney function, especially if not using a feeding tube.  Appropriate referrals were previously made for supportive care including physical therapy, speech pathology, nutrition.  RTC in 1 week for labs, office visit and chemotherapy.  That would be the last of the planned chemotherapy.

## 2024-11-18 ENCOUNTER — Ambulatory Visit

## 2024-11-18 ENCOUNTER — Encounter (HOSPITAL_COMMUNITY): Payer: Self-pay | Admitting: *Deleted

## 2024-11-18 NOTE — Progress Notes (Signed)
 Oncology Nurse Navigator Documentation   Dave [facility] called me this am to report that he was not feeling well. He reports a cough, fatigue, and fever early this morning of 101.5. He took pain medicine with tylenol  in it and it has reduced his fever to 99.5 currently. He has cancelled his radiation appointment today. I have notified Dr. Izell and Dr. Autumn of his symptoms and Dr. Autumn has recommended that he present to the ED or urgent care for evaluation/ blood work/ respiratory panel. I have called Mr. Lemen with the recommendation and left him a VM. I left my direct contact information and asked him to return my call when able.  [name] RN, BSN, OCN Head & Neck Oncology Nurse Navigator [facility] at [facility] Phone # [phone]  Fax # [phone]

## 2024-11-19 ENCOUNTER — Ambulatory Visit

## 2024-11-19 ENCOUNTER — Inpatient Hospital Stay

## 2024-11-19 ENCOUNTER — Inpatient Hospital Stay: Admitting: Dietician

## 2024-11-19 NOTE — Progress Notes (Signed)
 Oncology Nurse Navigator Documentation   Mr. Thackston called me this morning. He tells me that his fever has broken and he's feeling a little better but still very tired. He has cancelled his radiation and lab appointment today due to the fatigue. I explained that this is a normal side effect of treatment and encouraged him to come for radiation today. I told him that he is compromising the efficacy of his treatment by missing multiple appointments throughout each week. He voiced his understanding of the risks of not coming for treatment but declined to come in today. I have notified Dr. Izell, Dr. Autumn, nutrition, and nursing of the cancelled appointments. I have also contacted SW to discuss the option of transportation services available to him.   [name] RN, BSN, OCN Head & Neck Oncology Nurse Navigator [facility] Phone # [phone]  Fax # [phone]

## 2024-11-20 ENCOUNTER — Ambulatory Visit

## 2024-11-20 ENCOUNTER — Telehealth: Payer: Self-pay

## 2024-11-20 NOTE — Telephone Encounter (Signed)
 [facility] CSW Progress Note  Clinical Social Worker is assisting medical team with arranging transportation for patient to/from appointments. Patient is outside of the required radius and needs approval before it can be set up. CSW working with presenter, broadcasting to complete Ambulatory Exception Request form. CSW spoke with patient this am. Patient confirmed he will be attending his appointment and does have transportation to appointment.    Lizbeth Sprague, LCSW Clinical Social Worker [facility]

## 2024-11-20 NOTE — Progress Notes (Addendum)
 Transportation Exception Form  * If the practice/clinic is more than 25 miles from the patient's address, please provide the following information:    Patients Name Dave Williams Up Date   Patient DOB Apr 12, 1956 Practice Location   Patient MRN [id number]    Patient Home Address [address] [place] [address]      Description of Clinical Conditions Necessitating Transport  Patient lives approx 45 minutes away from the cancer center. Patient has had several missed appointments to treatment attributing to different reasons. Patient has cancelled these appointments within hours of his scheduled appointment time.  Medical team is hopeful transportation assistance will aide in additional support to getting patient to appointments.    Addendum 12/15 - Patient is unable to attend a cancer center located closer to his home  [facility] does not offer radiation for patients, therefore he has to receive care at closest center [facility])     International Aid/development Worker of Requester: Lizbeth Sprague, LCSW on behalf of Rad Onc Medical Team  DATE:  11/20/2024

## 2024-11-20 NOTE — Progress Notes (Signed)
 Oncology Nurse Navigator Documentation   Mr. Cruise called me again this morning to report fatigue and he feels unsafe driving to his radiation appointment today. SW is working with him to help get him set up for transportation through [employer] but he lives outside the normal radius and needs special approval which is being worked on right now by [employer]. I have once again made him aware that he is compromising the effectiveness of the treatment for his cancer and he verbalized his understanding. Dr. Izell has been made aware along with the [facility].  [name] RN, BSN, OCN Head & Neck Oncology Nurse Navigator [facility] at [facility] Phone # [phone]  Fax # [phone]

## 2024-11-21 ENCOUNTER — Inpatient Hospital Stay: Admitting: Oncology

## 2024-11-21 ENCOUNTER — Telehealth: Payer: Self-pay | Admitting: Cardiovascular Disease

## 2024-11-21 ENCOUNTER — Other Ambulatory Visit: Payer: Self-pay

## 2024-11-21 ENCOUNTER — Ambulatory Visit
Admission: RE | Admit: 2024-11-21 | Discharge: 2024-11-21 | Disposition: A | Discharge status: Home / Self Care | Source: Ambulatory Visit | Attending: Radiation Oncology | Admitting: Radiation Oncology

## 2024-11-21 ENCOUNTER — Other Ambulatory Visit: Payer: Self-pay | Admitting: Radiation Oncology

## 2024-11-21 ENCOUNTER — Inpatient Hospital Stay

## 2024-11-21 ENCOUNTER — Encounter: Payer: Self-pay | Admitting: Oncology

## 2024-11-21 ENCOUNTER — Ambulatory Visit
Admission: RE | Admit: 2024-11-21 | Discharge: 2024-11-21 | Disposition: A | Source: Ambulatory Visit | Attending: Radiation Oncology

## 2024-11-21 VITALS — BP 103/73 | HR 103 | Temp 98.0°F | Resp 18 | Ht 73.0 in | Wt 222.8 lb

## 2024-11-21 DIAGNOSIS — C12 Malignant neoplasm of pyriform sinus: Secondary | ICD-10-CM | POA: Diagnosis not present

## 2024-11-21 DIAGNOSIS — G893 Neoplasm related pain (acute) (chronic): Secondary | ICD-10-CM

## 2024-11-21 DIAGNOSIS — Z510 Encounter for antineoplastic radiation therapy: Secondary | ICD-10-CM | POA: Diagnosis not present

## 2024-11-21 DIAGNOSIS — Z931 Gastrostomy status: Secondary | ICD-10-CM

## 2024-11-21 DIAGNOSIS — R1310 Dysphagia, unspecified: Secondary | ICD-10-CM | POA: Diagnosis not present

## 2024-11-21 DIAGNOSIS — T451X5D Adverse effect of antineoplastic and immunosuppressive drugs, subsequent encounter: Secondary | ICD-10-CM

## 2024-11-21 DIAGNOSIS — R530 Neoplastic (malignant) related fatigue: Secondary | ICD-10-CM | POA: Diagnosis not present

## 2024-11-21 DIAGNOSIS — I480 Paroxysmal atrial fibrillation: Secondary | ICD-10-CM

## 2024-11-21 DIAGNOSIS — D701 Agranulocytosis secondary to cancer chemotherapy: Secondary | ICD-10-CM | POA: Diagnosis not present

## 2024-11-21 DIAGNOSIS — J387 Other diseases of larynx: Secondary | ICD-10-CM

## 2024-11-21 DIAGNOSIS — R066 Hiccough: Secondary | ICD-10-CM | POA: Diagnosis not present

## 2024-11-21 LAB — RAD ONC ARIA SESSION SUMMARY
Course Elapsed Days: 49
Plan Fractions Treated to Date: 25
Plan Prescribed Dose Per Fraction: 2 Gy
Plan Total Fractions Prescribed: 35
Plan Total Prescribed Dose: 70 Gy
Reference Point Dosage Given to Date: 50 Gy
Reference Point Session Dosage Given: 2 Gy
Session Number: 25

## 2024-11-21 LAB — CBC WITH DIFFERENTIAL (CANCER CENTER ONLY)
Abs Immature Granulocytes: 0 K/uL (ref 0.00–0.07)
Basophils Absolute: 0 K/uL (ref 0.0–0.1)
Basophils Relative: 0 %
Eosinophils Absolute: 0 K/uL (ref 0.0–0.5)
Eosinophils Relative: 0 %
HCT: 29.5 % — ABNORMAL LOW (ref 39.0–52.0)
Hemoglobin: 10.1 g/dL — ABNORMAL LOW (ref 13.0–17.0)
Immature Granulocytes: 0 %
Lymphocytes Relative: 14 %
Lymphs Abs: 0.3 K/uL — ABNORMAL LOW (ref 0.7–4.0)
MCH: 30.9 pg (ref 26.0–34.0)
MCHC: 34.2 g/dL (ref 30.0–36.0)
MCV: 90.2 fL (ref 80.0–100.0)
Monocytes Absolute: 0.4 K/uL (ref 0.1–1.0)
Monocytes Relative: 20 %
Neutro Abs: 1.3 K/uL — ABNORMAL LOW (ref 1.7–7.7)
Neutrophils Relative %: 66 %
Platelet Count: 168 K/uL (ref 150–400)
RBC: 3.27 MIL/uL — ABNORMAL LOW (ref 4.22–5.81)
RDW: 14.6 % (ref 11.5–15.5)
WBC Count: 2 K/uL — ABNORMAL LOW (ref 4.0–10.5)
nRBC: 0 % (ref 0.0–0.2)

## 2024-11-21 LAB — BASIC METABOLIC PANEL - CANCER CENTER ONLY
Anion gap: 12 (ref 5–15)
BUN: 17 mg/dL (ref 8–23)
CO2: 26 mmol/L (ref 22–32)
Calcium: 9.6 mg/dL (ref 8.9–10.3)
Chloride: 98 mmol/L (ref 98–111)
Creatinine: 0.97 mg/dL (ref 0.61–1.24)
GFR, Estimated: >60 mL/min (ref >60)
Glucose, Bld: 200 mg/dL — ABNORMAL HIGH (ref 70–99)
Potassium: 4.4 mmol/L (ref 3.5–5.1)
Sodium: 135 mmol/L (ref 135–145)

## 2024-11-21 LAB — PHOSPHORUS: Phosphorus: 2.5 mg/dL (ref 2.5–4.6)

## 2024-11-21 LAB — MAGNESIUM: Magnesium: 2.2 mg/dL (ref 1.7–2.4)

## 2024-11-21 MED ORDER — CHLORPROMAZINE HCL 25 MG PO TABS: 25.0000 mg | ORAL_TABLET | Freq: Three times a day (TID) | ORAL | 1 refills | Status: AC | PRN

## 2024-11-21 MED ORDER — NYSTATIN 100000 UNIT/ML MT SUSP: OROMUCOSAL | 0 refills | Status: AC

## 2024-11-21 NOTE — Progress Notes (Signed)
 Opened in error

## 2024-11-21 NOTE — Progress Notes (Signed)
 Oncology Nurse Navigator Documentation   I met Mr. Kolakowski after his radiation appointment today. I removed two t tacs from his G-tube without difficulty. The skin underneath was clean, dry, and intact and he tolerated it well. He knows to call me if he has any questions or concerns in the future.   [name] RN, BSN, OCN Head & Neck Oncology Nurse Navigator [facility] at [facility] Phone # [phone]  Fax # [phone]

## 2024-11-21 NOTE — Telephone Encounter (Signed)
 STAT if HR is under 50 or over 120 (normal HR is 60-100 beats per minute)  What is your heart rate?   Averaging 90-120 every night  Do you have a log of your heart rate readings (document readings)?   No  Do you have any other symptoms?   No  Patient stated his HR has been trending between 90-120 every night for over a month.  Patient noted he has been having chemotherapy treatments.  Patient is concerned if his medication needs to be adjusted.

## 2024-11-21 NOTE — Progress Notes (Signed)
 [facility]  ONCOLOGY CLINIC PROGRESS NOTE   Patient Care Team: Myra Geni ORN, FNP as PCP - General (Family Medicine) Court Dorn PARAS, MD as PCP - Cardiology (Cardiology) Malmfelt, Delon CROME, RN as Oncology Nurse Navigator Izell Domino, MD as Consulting Physician (Radiation Oncology) Autumn Millman, MD as Consulting Physician (Oncology) Tobie Eldora NOVAK, MD as Consulting Physician (Otolaryngology)  PATIENT NAME: Dave Williams   MR#: [id number] DOB: 1956-11-09  Date of visit: 11/21/2024   ASSESSMENT & PLAN:   Dave Williams is a 68 y.o. 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   Cancer of pyriform sinus (HCC) Please review oncology history for additional details and timeline of events.  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  Patient declined surgical option.  Hence plan made to proceed with concurrent chemoradiation.  Renal function is fluctuating and this needs to be monitored closely. The cancer has a high risk of recurrence, necessitating aggressive treatment.  We have discussed about role of cisplatin  being a radiosensitizer in the treatment of head and neck cancer.  We have discussed about the curative intent of chemoradiation for this patient.  Patient was willing to proceed with weekly cisplatin .  Concurrent chemoradiation with weekly cisplatin  started from 10/03/2024.  He has been tolerating chemotherapy reasonably well.   Previously he wanted to defer feeding tube placement but because of progressive weight loss, recently he underwent feeding tube placement and he is using it as directed.  Due for cycle 7 of chemotherapy  with cisplatin  on 11/22/2024.   Labs today reveal persistent leukopenia with white count of 2000, ANC of 1300.  Platelet count is now normal.  Hemoglobin stable at 10.1.  We will proceed with cycle 7 of cisplatin  tomorrow at standard dosing of 40 mg/m.  We will give him G-CSF support with Zarxio 300 mcg daily for 3 days starting next week, pending authorization.  His last scheduled radiation session is on 12/04/2024.  Radiation appointments had to be extended because of missed appointments previously.  We will not plan for further chemotherapy at this time after completion of cycle 7, especially given progressive cytopenias, nausea, vomiting and fatigue.  - Encourage hydration to maintain kidney function, especially if not using a feeding tube.  Appropriate referrals were previously made for supportive care including physical therapy, speech pathology, nutrition.  RTC in 1 week for labs, office visit for reevaluation.  Chemotherapy-induced neutropenia He has chemotherapy-induced neutropenia with a current white blood cell count of 2,000/L and neutrophil count of 1,300/L, both above the threshold for safe chemotherapy administration. Booster injections are planned to support neutrophil recovery after the final chemotherapy cycle. - Administer Zarxio 300 mcg, once daily for three days next week, pending authorization. - Arrange follow-up blood work and assessment next Friday.  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 Recent overuse of tube feeds and fluids resulted in transient emesis due to gastrointestinal overload, with subsequent recovery. Intermittent hiccups may be related to gastrostomy tube manipulation. - Advise spacing tube feeds by at least one hour to prevent gastrointestinal overload.  Hiccough He experiences intermittent hiccups, likely related to gastrostomy tube status and manipulation. No associated acid  reflux or heartburn. Symptoms are bothersome but not constant. - Prescribed Thorazine, three times daily as needed, sent to [facility].  Neoplasm-related pain Experiencing significant throat pain, likely related to the neoplasm. Previous prescription for hydrocodone  was not filled due to pharmacy issues. - Sent prescription for hydrocodone  to [facility].  Dysphagia Experiencing difficulty eating due to nausea and aversion to food, likely related to treatment and neoplasm. - Continue nutritional support with feeding tube, currently receiving four times a day.  Cancer-related fatigue Experiencing increased fatigue, likely related to treatment and low blood counts. - Encouraged increased fluid intake to address potential dehydration.  I reviewed lab results and outside records for this visit and discussed relevant results with the patient. Diagnosis, plan of care and treatment options were also discussed in detail with the patient. Opportunity provided to ask questions and answers provided to his apparent satisfaction. Provided instructions to call our clinic with any problems, questions or concerns prior to return visit. I recommended to continue follow-up with PCP and sub-specialists. He verbalized understanding and agreed with the plan.   NCCN guidelines have been consulted in the planning of this patients care.  I spent a total of 42 minutes during this encounter with the patient including review of chart and various tests results, discussions about plan of care and coordination of care plan.   Chinita Patten, MD  11/21/2024 12:38 PM  [facility] - A DEPT OF [facility] [address] [place] [address] Dept: [phone] Dept Fax: [phone]    CHIEF COMPLAINT/ REASON FOR VISIT:   Squamous cell carcinoma of the pyriform sinus, cT2, cN3, cM0, stage IV B.   Current Treatment:  Concurrent  chemoradiation with weekly cisplatin  started from 10/03/2024  INTERVAL HISTORY:    Discussed the use of AI scribe software for clinical note transcription with the patient, who gave verbal consent to proceed.  History of Present Illness Dave Williams is a 68 year old male with T-stage laryngeal squamous cell carcinoma undergoing concurrent chemoradiation who presents for follow-up during his final planned chemotherapy cycle.  He is currently receiving concurrent chemoradiation for laryngeal cancer, with the seventh and final cycle of chemotherapy scheduled for tomorrow. Due to prior interruptions, daily radiation treatments have been extended, with completion anticipated on December 24. He continues to attend daily radiation sessions and is available for scheduling of upcoming granulocyte colony-stimulating factor injections to support neutrophil recovery.  Earlier this week, he developed a fever up to 101.32F and mild abdominal discomfort, which he attributes to recent exposure to individuals with upper respiratory symptoms. The fever resolved within several hours after hydration and rest, without medical intervention. During this period, he experienced weakness and refrained from driving. He has since recovered and resumed his usual activities, including daily radiation sessions.  He is fully dependent on gastrostomy tube feeds for nutrition, with oral intake limited to liquids. Last night, he had a single episode of forceful emesis after administering tube feeds and fluids in close succession, which he attributed to gastric overload while recovering from the recent viral illness. He felt well after this episode and has since adjusted his feeding schedule.  He continues to experience intermittent hiccups, which he associates with the gastrostomy tube. The episodes are sporadic, sometimes lasting several hours, and are bothersome but not constant. He denies acid reflux or heartburn.   I  have reviewed the past medical history, past surgical history, social history  and family history with the patient and they are unchanged from previous note.  HISTORY OF PRESENT ILLNESS:   ONCOLOGY HISTORY:   He presented to his PCP with complains of a recurrent right neck mass.   Patient had US  neck on 06/05/24 showing enlarged lymph nodes in right superior cervical chain measuring 5.1 x 2.1 x 3.0 cm and 2.5 x 1.4 x 1.6 cm. CT soft tissue neck was performed on 06/16/24 showed a 2 cm supraglottic mass to the right of midline consistent with a malignancy and bulky malignant lymphadenopathy on the right at level 2 and level 3, with indistinct margins consistent with extra capsular spread. The upper level 2 node shows a diameter of 2.2 x 2.8 cm. The lower level 2 node measures approximately 28 x 17 mm. Level 3 lymph node similarly with indistinct margins shows diameter of 21 x 19 mm.    His PCP referred him to our clinic for further evaluation of neck lymphadenopathy.  He was seen in our rapid diagnostic clinic on 06/19/2024.  CBCD was unremarkable.  Flow cytometry of peripheral blood was also unremarkable.  ESR, CRP, LDH were within normal limits.  HIV, hepatitis panel testing was negative.   To further investigate his symptoms, he was referred to Dr. Tobie on 06/26/24. At that time, patient recalled noticing a right neck mass last year that eventually resolved on its own. However, in January 2025, mass had reoccurred and had increased in size, causing  discomfort in the area.    He then underwent a Transnasal Fiberoptic Laryngoscopy indicating a hypopharynx neck mass involving the pyriform and likely arytenoid submucosally concerning for malignancy. Vocal folds were mobile bilaterally. No lesions on the free edge of the vocal folds. No airway compromise concern   Subsequently, he underwent a right neck mass biopsy on 07/24/24 which revealed invasive squamous cell carcinoma.         He presented for a  follow up with Dr. Tobie on 07/31/24 to discuss further treatment options. Patient expressed no interest in surgical interventions. He would like to consider chemoradiation.    PET scan performed on 08/01/24 showed the right eccentric supraglottic mass has a maximum SUV of 6.9 along with hypermetabolic activity in all identified lymph nodes. Scan also noted several scattered pulmonary nodules with the largest measuring 0.4 x 0.5 cm with surveillance recommended as nodules are small in size to detect hypermetabolic activity.     Family history includes his father having esophageal cancer that metastasized to the spine, his mother having skin and anal cancer, and his sister having thyroid  cancer.    He had consultation with Dr. Izell on 08/06/2024.  Plan made for 7 weeks of radiation therapy and he was referred to us  for concurrent chemotherapy.   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  Patient declined surgical option.  Hence plan made to proceed with concurrent chemoradiation using weekly cisplatin .  Renal function is fluctuating and this needs to be monitored closely.   Further chart review shows patient established care with GI on 06/10/24 after having a positive cologuard test. It was recommended patient have colonoscopy however he prefers to hold off while undergoing oncology work up. GI will reach out to patient in 2 months to schedule.   Started concurrent chemoradiation with weekly cisplatin  from 10/03/2024.  Oncology History  Cancer of pyriform sinus (HCC)  08/07/2024 Initial Diagnosis   Cancer of pyriform sinus (HCC)   08/07/2024 Cancer Staging  Staging form: Pharynx - Hypopharynx, AJCC 8th Edition - Clinical stage from 08/07/2024: Stage IVB (cT2, cN3, cM0) - Signed by Izell Domino, MD on 08/07/2024 Stage prefix: Initial diagnosis   10/03/2024 -  Chemotherapy   Patient is on Treatment Plan : HEAD/NECK Cisplatin  (40) q7d         REVIEW  OF SYSTEMS:   Review of Systems - Oncology  All other pertinent systems were reviewed with the patient and are negative.  ALLERGIES: He is allergic to prednisone .  MEDICATIONS:  Current Outpatient Medications  Medication Sig Dispense Refill   chlorproMAZINE (THORAZINE) 25 MG tablet Take 1 tablet (25 mg total) by mouth 3 (three) times daily as needed for hiccoughs. 30 tablet 1   acetaminophen  (TYLENOL ) 500 MG tablet Take 1,000 mg by mouth every 6 (six) hours as needed (pain.).     ALPRAZolam  (XANAX ) 0.5 MG tablet Take 1 tablet (0.5 mg total) by mouth at bedtime as needed for anxiety or sleep. 30 tablet 0   amLODipine  (NORVASC ) 10 MG tablet Take 1 tablet (10 mg total) by mouth daily. 90 tablet 2   aspirin  EC 81 MG tablet Take 1 tablet (81 mg total) by mouth daily. Swallow whole.     atorvastatin  (LIPITOR ) 80 MG tablet Take 1 tablet (80 mg total) by mouth daily with supper. 90 tablet 3   HYDROcodone -acetaminophen  (NORCO) 7.5-325 MG tablet Take 1 tablet by mouth every 6 (six) hours as needed for moderate pain (pain score 4-6). 60 tablet 0   isosorbide  mononitrate (IMDUR ) 30 MG 24 hr tablet Take 1 tablet (30 mg total) by mouth daily. 90 tablet 1   lidocaine  (XYLOCAINE ) 2 % solution Patient: Mix 1part 2% viscous lidocaine , 1part H20. Swish & swallow 10mL of diluted mixture, 30min before meals and at bedtime, up to QID 200 mL 3   lidocaine -prilocaine  (EMLA ) cream Apply to affected area once 30 g 3   nitroGLYCERIN  (NITROSTAT ) 0.4 MG SL tablet DISSOLVE 1 TABLET UNDER THE TONGUE EVERY 5 MINUTES AS NEEDED FOR CHEST PAIN. DO NOT EXCEED A TOTAL OF 3 DOSES IN 15 MINUTES. 25 tablet 6   Nutritional Supplements (NUTREN 1.5) LIQD 2 cartons Nutren 1.5 via G-tube 4 times daily with 60 mL free water before and after each bolus feeding.  Give additional 300 mL water 3 times daily between feeds.  Provides 3000 cal, 136 g protein, 2908 mL free water./100% estimated needs. 1896 mL 12   ondansetron  (ZOFRAN ) 8 MG tablet  Take 1 tablet (8 mg total) by mouth every 8 (eight) hours as needed for nausea or vomiting. Start on the third day after cisplatin . 30 tablet 1   pantoprazole  (PROTONIX ) 40 MG tablet Take 1 tablet (40 mg total) by mouth daily. 30 tablet 0   prochlorperazine  (COMPAZINE ) 10 MG tablet Take 1 tablet (10 mg total) by mouth every 6 (six) hours as needed (Nausea or vomiting). 30 tablet 1   vitamin D3 (CHOLECALCIFEROL ) 25 MCG tablet Take 1 tablet (1,000 Units total) by mouth daily. 30 tablet 0   No current facility-administered medications for this visit.     VITALS:   Blood pressure 103/73, pulse (!) 103, temperature 98 F (36.7 C), temperature source Oral, resp. rate 18, height 6' 1 (1.854 m), weight 222 lb 12.8 oz (101.1 kg), SpO2 96%.  Wt Readings from Last 3 Encounters:  11/21/24 222 lb 12.8 oz (101.1 kg)  11/14/24 224 lb (101.6 kg)  11/06/24 219 lb 8 oz (99.6 kg)    Body mass  index is 29.39 kg/m.       PHYSICAL EXAM:   Physical Exam Constitutional:      General: He is not in acute distress.    Appearance: Normal appearance.  HENT:     Head: Normocephalic and atraumatic.  Eyes:     Conjunctiva/sclera: Conjunctivae normal.  Cardiovascular:     Rate and Rhythm: Normal rate and regular rhythm.  Pulmonary:     Effort: Pulmonary effort is normal. No respiratory distress.  Chest:     Comments: Port-A-Cath in place without signs of infection. Abdominal:     General: There is no distension.  Lymphadenopathy:     Cervical: Cervical adenopathy (Right Level 2/3 mass, fixed, ~ 5cm, less firm compared to prior) present.  Neurological:     General: No focal deficit present.     Mental Status: He is alert and oriented to person, place, and time.  Psychiatric:        Mood and Affect: Mood normal.        Behavior: Behavior normal.      LABORATORY DATA:   I have reviewed the data as listed.  Results for orders placed or performed in visit on 11/21/24  Rad Onc Aria Session  Summary  Result Value Ref Range   Course ID [id number]    Course Start Date 09/23/2024    Session Number 25    Course First Treatment Date 10/03/2024  3:00 PM    Course Last Treatment Date 11/21/2024 10:43 AM    Course Elapsed Days 49    Reference Point ID HN DP    Reference Point Dosage Given to Date 50.00000025 Gy   Reference Point Session Dosage Given 2.00000001 Gy   Plan ID [id number]    Plan Fractions Treated to Date 25    Plan Total Fractions Prescribed 35    Plan Prescribed Dose Per Fraction 2 Gy   Plan Total Prescribed Dose 70.000000 Gy   Plan Primary Reference Point HN DP   Results for orders placed or performed in visit on 11/21/24  Basic Metabolic Panel - Cancer Center Only  Result Value Ref Range   Sodium 135 135 - 145 mmol/L   Potassium 4.4 3.5 - 5.1 mmol/L   Chloride 98 98 - 111 mmol/L   CO2 26 22 - 32 mmol/L   Glucose, Bld 200 (H) 70 - 99 mg/dL   BUN 17 8 - 23 mg/dL   Creatinine 9.02 9.38 - 1.24 mg/dL   Calcium  9.6 8.9 - 10.3 mg/dL   GFR, Estimated >39 >39 mL/min   Anion gap 12 5 - 15  CBC with Differential (Cancer Center Only)  Result Value Ref Range   WBC Count 2.0 (L) 4.0 - 10.5 K/uL   RBC 3.27 (L) 4.22 - 5.81 MIL/uL   Hemoglobin 10.1 (L) 13.0 - 17.0 g/dL   HCT 70.4 (L) 60.9 - 47.9 %   MCV 90.2 80.0 - 100.0 fL   MCH 30.9 26.0 - 34.0 pg   MCHC 34.2 30.0 - 36.0 g/dL   RDW 85.3 88.4 - 84.4 %   Platelet Count 168 150 - 400 K/uL   nRBC 0.0 0.0 - 0.2 %   Neutrophils Relative % 66 %   Neutro Abs 1.3 (L) 1.7 - 7.7 K/uL   Lymphocytes Relative 14 %   Lymphs Abs 0.3 (L) 0.7 - 4.0 K/uL   Monocytes Relative 20 %   Monocytes Absolute 0.4 0.1 - 1.0 K/uL   Eosinophils Relative 0 %  Eosinophils Absolute 0.0 0.0 - 0.5 K/uL   Basophils Relative 0 %   Basophils Absolute 0.0 0.0 - 0.1 K/uL   Immature Granulocytes 0 %   Abs Immature Granulocytes 0.00 0.00 - 0.07 K/uL  Magnesium   Result Value Ref Range   Magnesium  2.2 1.7 - 2.4 mg/dL  Phosphorus  Result Value Ref  Range   Phosphorus 2.5 2.5 - 4.6 mg/dL      RADIOGRAPHIC STUDIES:  I have personally reviewed the radiological images as listed and agree with the findings in the report.  IR GASTROSTOMY TUBE MOD SED Result Date: 11/05/2024 INDICATION: 68 year old male with history of head neck cancer requiring percutaneous gastrostomy access for supplemental nutrition. EXAM: PERC PLACEMENT GASTROSTOMY MEDICATIONS: Ancef  2 gm IV; Antibiotics were administered within 1 hour of the procedure. ANESTHESIA/SEDATION: Versed  4 mg IV; Fentanyl  100 mcg IV Moderate Sedation Time:  19 The patient was continuously monitored during the procedure by the interventional radiology nurse under my direct supervision. CONTRAST:  15mL OMNIPAQUE  IOHEXOL  300 MG/ML SOLN - administered into the gastric lumen. FLUOROSCOPY TIME:  Six mGy reference air kerma COMPLICATIONS: None immediate. PROCEDURE: Informed written consent was obtained from the patient after a thorough discussion of the procedural risks, benefits and alternatives. All questions were addressed. Maximal Sterile barrier Technique was utilized including caps, mask, sterile gowns, sterile gloves, sterile drape, hand hygiene and skin antiseptic. A timeout was performed prior to the initiation of the procedure. The patient was placed on the procedure table in the supine position. Pre-procedure abdominal film confirmed visualization of the transverse colon. An angled 5-French catheter was passed through the nares into the stomach. The patient was prepped and draped in usual sterile fashion. The stomach was insufflated with air via the indwelling nasogastric tube. Under fluoroscopy, a puncture site was selected and local analgesia achieved with 1% lidocaine  infiltrated subcutaneously. Under fluoroscopic guidance, a gastropexy needle was passed into the stomach and the T-bar suture was released. Entry into the stomach was confirmed with fluoroscopy, aspiration of air, and injection of  contrast material. This was repeated with an additional gastropexy suture (for a total of 2 fasteners). At the center of these gastropexy sutures, a dermatotomy was performed. An 18 gauge needle was passed into the stomach at the site of this dermatotomy, and position within the gastric lumen again confirmed under fluoroscopy using aspiration of air and contrast injection. An Amplatz guidewire was passed through this needle and intraluminal placement within the stomach was confirmed by fluoroscopy. The needle was removed. Over the guidewire, the percutaneous tract was dilated using a 10 mm non-compliant balloon. The balloon was deflated, then pushed into the gastric lumen followed in concert by the 20 Fr gastrostomy tube. The retention balloon of the percutaneous gastrostomy tube was inflated with 10 mL of sterile water. The tube was withdrawn until the retention balloon was at the edge of the gastric lumen. The external bumper was brought to the abdominal wall. Contrast was injected through the gastrostomy tube, confirming intraluminal positioning. The patient tolerated the procedure well without any immediate post-procedural complications. IMPRESSION: Technically successful placement of 20 Fr gastrostomy tube. Ester Sides, MD [facility] Radiology Electronically Signed   By: Ester Sides M.D.   On: 11/05/2024 18:15      CODE STATUS:  Code Status History     Date Active Date Inactive Code Status Order ID Comments User Context   09/25/2024 1523 09/26/2024 0524 Full Code [id number]  Jenna Cordella LABOR, MD HOV  03/13/2024 1550 03/21/2024 1532 Full Code [id number]  Maurice Sharlet GORMAN DEVONNA Inpatient   03/04/2024 1247 03/13/2024 1548 Full Code [id number]  Viviane Lemond FORBES DEVONNA Inpatient   11/28/2023 1625 11/28/2023 2356 Full Code [id number]  Jordan, Peter M, MD Inpatient    Questions for Most Recent Historical Code Status (Order [id number])     Question Answer   By:  Consent: discussion documented in EHR            No orders of the defined types were placed in this encounter.    Future Appointments  Date Time Provider Department Center  11/22/2024 10:15 AM [facility] LINAC 4 [facility] None  11/22/2024 10:30 AM [facility] INFUSION [facility] None  11/22/2024  2:30 PM Neff, Barbara L, RD [facility] None  11/25/2024 10:15 AM [facility] LINAC 4 [facility] None  11/25/2024 10:30 AM LINAC-SQUIRE [facility] None  11/26/2024 10:15 AM [facility] LINAC 4 [facility] None  11/27/2024 10:15 AM [facility] LINAC 4 [facility] None  11/28/2024 10:15 AM [facility] LINAC 4 [facility] None  11/29/2024  1:15 PM [facility] LINAC 4 [facility] None  11/29/2024  2:00 PM [facility] LAB [facility] None  11/29/2024  2:30 PM Challen Spainhour, MD [facility] None  12/01/2024 10:15 AM [facility] [id number] [facility] None  12/02/2024 11:30 AM [facility] LINAC 4 [facility] None  12/03/2024 11:15 AM [facility] [id number] [facility] None  12/04/2024 11:15 AM [facility] LINAC 4 [facility] None  12/17/2024 10:15 AM Schinke, Lupita NOVAK, CCC-SLP [facility]  12/19/2024 10:00 AM Breedlove Blue, Blaire L, PT [facility] None  01/22/2025 10:30 AM Rana Lum CROME, NP [facility] H[facility]V     This document was completed utilizing speech recognition software. Grammatical errors, random word insertions, pronoun errors, and incomplete sentences are an occasional consequence of this system due to software limitations, ambient noise, and hardware issues. Any formal questions or concerns about the content, text or information contained within the body of this dictation should be directly addressed to the provider for clarification.

## 2024-11-21 NOTE — Assessment & Plan Note (Signed)
 Please review oncology history for additional details and timeline of events.  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  Patient declined surgical option.  Hence plan made to proceed with concurrent chemoradiation.  Renal function is fluctuating and this needs to be monitored closely. The cancer has a high risk of recurrence, necessitating aggressive treatment.  We have discussed about role of cisplatin  being a radiosensitizer in the treatment of head and neck cancer.  We have discussed about the curative intent of chemoradiation for this patient.  Patient was willing to proceed with weekly cisplatin .  Concurrent chemoradiation with weekly cisplatin  started from 10/03/2024.  He has been tolerating chemotherapy reasonably well.   Previously he wanted to defer feeding tube placement but because of progressive weight loss, recently he underwent feeding tube placement and he is using it as directed.  Due for cycle 7 of chemotherapy with cisplatin  on 11/22/2024.   Labs today reveal persistent leukopenia with white count of 2000, ANC of 1300.  Platelet count is now normal.  Hemoglobin stable at 10.1.  We will proceed with cycle 7 of cisplatin  tomorrow at standard dosing of 40 mg/m.  We will give him G-CSF support with Zarxio 300 mcg daily for 3 days starting next week, pending authorization.  His last scheduled radiation session is on 12/04/2024.  Radiation appointments had to be extended because of missed appointments previously.  We will not plan for further chemotherapy at this time after completion of cycle 7, especially given progressive cytopenias, nausea, vomiting and fatigue.  - Encourage hydration to maintain kidney function, especially if not using a feeding tube.  Appropriate referrals were previously made for supportive care including physical therapy, speech pathology, nutrition.  RTC in 1 week for labs, office visit for reevaluation.

## 2024-11-21 NOTE — Telephone Encounter (Signed)
 Spoke with patient of Dr. Court   He notes HR of 110 at 3am, BP 153/95 HR elevated routinely 90s-120s Drinks 2 cups coffee daily in AM  Previously, HR at rest was 50-60s. Had previously been on toprol , d/c'ed. Last visit with Dr. Court amiodarone  and Eliquis  were discontinued (h/o post-op aflutter)  He saw Dr. Autumn today. HR was 103. He said Dr. Autumn did not auscultate heart sounds for arrhythmia concerns.  He will be at [facility] tomorrow for his last chemo treatment. Advised that he mention to nursing staff his HR concerns to see if this can be checked on while he is there.   He does not have an AppleWatch or Kardia to send ECG  Will forward to Dr. Court

## 2024-11-22 ENCOUNTER — Other Ambulatory Visit: Payer: Self-pay

## 2024-11-22 ENCOUNTER — Ambulatory Visit

## 2024-11-22 ENCOUNTER — Inpatient Hospital Stay: Admitting: Nutrition

## 2024-11-22 ENCOUNTER — Inpatient Hospital Stay

## 2024-11-22 ENCOUNTER — Ambulatory Visit
Admission: RE | Admit: 2024-11-22 | Discharge: 2024-11-22 | Disposition: A | Source: Ambulatory Visit | Attending: Radiation Oncology

## 2024-11-22 VITALS — BP 110/69 | HR 90 | Temp 98.0°F | Resp 18 | Wt 220.8 lb

## 2024-11-22 DIAGNOSIS — C12 Malignant neoplasm of pyriform sinus: Secondary | ICD-10-CM

## 2024-11-22 DIAGNOSIS — Z510 Encounter for antineoplastic radiation therapy: Secondary | ICD-10-CM | POA: Diagnosis not present

## 2024-11-22 LAB — RAD ONC ARIA SESSION SUMMARY
Course Elapsed Days: 50
Plan Fractions Treated to Date: 26
Plan Prescribed Dose Per Fraction: 2 Gy
Plan Total Fractions Prescribed: 35
Plan Total Prescribed Dose: 70 Gy
Reference Point Dosage Given to Date: 52 Gy
Reference Point Session Dosage Given: 2 Gy
Session Number: 26

## 2024-11-22 MED ORDER — PALONOSETRON HCL INJECTION 0.25 MG/5ML
0.2500 mg | Freq: Once | INTRAVENOUS | Status: AC
  Administered 2024-11-22: 0.25 mg via INTRAVENOUS
  Filled 2024-11-22: qty 5

## 2024-11-22 MED ORDER — SODIUM CHLORIDE 0.9 % IV SOLN
150.0000 mg | Freq: Once | INTRAVENOUS | Status: AC
  Administered 2024-11-22: 150 mg via INTRAVENOUS
  Filled 2024-11-22: qty 150

## 2024-11-22 MED ORDER — POTASSIUM CHLORIDE IN NACL 20-0.9 MEQ/L-% IV SOLN
Freq: Once | INTRAVENOUS | Status: AC
  Filled 2024-11-22: qty 1000

## 2024-11-22 MED ORDER — MAGNESIUM SULFATE 2 GM/50ML IV SOLN
2.0000 g | Freq: Once | INTRAVENOUS | Status: AC
  Administered 2024-11-22: 2 g via INTRAVENOUS
  Filled 2024-11-22: qty 50

## 2024-11-22 MED ORDER — SODIUM CHLORIDE 0.9 % IV SOLN: INTRAVENOUS | Status: DC

## 2024-11-22 MED ORDER — SODIUM CHLORIDE 0.9 % IV SOLN
40.0000 mg/m2 | Freq: Once | INTRAVENOUS | Status: AC
  Administered 2024-11-22: 100 mg via INTRAVENOUS
  Filled 2024-11-22: qty 100

## 2024-11-22 NOTE — Progress Notes (Signed)
 Nutrition follow-up completed with patient during infusion for stage IV cancer of piriform sinus.  He is receiving concurrent chemoradiation with weekly cisplatin .  He is followed by Dr. Autumn and Dr. Izell.  Final chemotherapy is today and final radiation therapy is scheduled for Wednesday, December 24.  PEG: November 25 DME: [employer]  Weight:  222.8 pounds December 11 219 pounds 8 ounces November 26 234 pounds November 13  Labs include glucose 200  Estimated nutrition needs:  2700-3080 cal, 132-154 g protein, greater than 3 L fluid.  Tube feeding: 8 cartons Nutren 1.5 with free water flushes provides 3000 cal, 136 g protein and 2908 mL free water  Patient has the hiccups which he finds to be quite bothersome but has gotten a prescription for Thorazine.  Reports that he was sick earlier this week.  He has started using his feeding tube to meet 100% of his nutrition needs.  States he is using 2 cartons of Nutren 1.5 4 times daily with good tolerance.  He uses 60 mL of free water before and after bolus feeding.  He is drinking 60 ounces of water minimum every day.  He denies nausea and vomiting.  His bowels are moving.  Reports initially he was giving his tube feeding too quickly, over about 5 minutes.  He had reflux and has felt better since he has slowed the feeding down.  Nutrition diagnosis:  Food and nutrition related knowledge deficit, improved.   Inadequate oral intake, continues.  Nutrition needs met via PEG.  Intervention: Continue 2 cartons Nutren 1.5 4 times daily with 60 mL free water before and after feedings.  Flush with a minimum of 300 mL free water 3 times a day. Eat and drink by mouth as tolerated. Reviewed importance of gradual tube feeding administration.  Patient has received home tube feeding formula and supplies.  Monitoring, evaluation, goals: Patient will tolerate adequate calories and protein to minimize loss of lean body mass.  Next visit: Will follow-up by  telephone next week.  Patient encouraged to call RD with questions or concerns.  **Disclaimer: This note was dictated with voice recognition software. Similar sounding words can inadvertently be transcribed and this note may contain transcription errors which may not have been corrected upon publication of note.**

## 2024-11-22 NOTE — Patient Instructions (Signed)

## 2024-11-25 ENCOUNTER — Ambulatory Visit

## 2024-11-25 ENCOUNTER — Telehealth: Payer: Self-pay | Admitting: Cardiovascular Disease

## 2024-11-25 ENCOUNTER — Other Ambulatory Visit (HOSPITAL_COMMUNITY): Payer: Self-pay

## 2024-11-25 ENCOUNTER — Other Ambulatory Visit: Payer: Self-pay

## 2024-11-25 ENCOUNTER — Ambulatory Visit: Admission: RE | Admit: 2024-11-25 | Discharge: 2024-11-25 | Attending: Radiation Oncology

## 2024-11-25 ENCOUNTER — Other Ambulatory Visit: Payer: Self-pay | Admitting: Radiation Oncology

## 2024-11-25 ENCOUNTER — Inpatient Hospital Stay

## 2024-11-25 ENCOUNTER — Ambulatory Visit
Admission: RE | Admit: 2024-11-25 | Discharge: 2024-11-25 | Disposition: A | Source: Ambulatory Visit | Attending: Radiation Oncology

## 2024-11-25 VITALS — BP 112/68 | HR 76 | Temp 97.7°F | Resp 18

## 2024-11-25 DIAGNOSIS — Z510 Encounter for antineoplastic radiation therapy: Secondary | ICD-10-CM | POA: Diagnosis not present

## 2024-11-25 DIAGNOSIS — D701 Agranulocytosis secondary to cancer chemotherapy: Secondary | ICD-10-CM

## 2024-11-25 DIAGNOSIS — C12 Malignant neoplasm of pyriform sinus: Secondary | ICD-10-CM

## 2024-11-25 LAB — RAD ONC ARIA SESSION SUMMARY
Course Elapsed Days: 53
Plan Fractions Treated to Date: 27
Plan Prescribed Dose Per Fraction: 2 Gy
Plan Total Fractions Prescribed: 35
Plan Total Prescribed Dose: 70 Gy
Reference Point Dosage Given to Date: 54 Gy
Reference Point Session Dosage Given: 2 Gy
Session Number: 27

## 2024-11-25 MED ORDER — METOCLOPRAMIDE HCL 5 MG/5ML PO SOLN
10.0000 mg | Freq: Three times a day (TID) | ORAL | 1 refills | Status: AC
  Filled 2024-11-25: qty 1000, 25d supply, fill #0

## 2024-11-25 MED ORDER — FILGRASTIM-SNDZ 300 MCG/0.5ML IJ SOSY
300.0000 ug | PREFILLED_SYRINGE | Freq: Once | INTRAMUSCULAR | Status: AC
  Administered 2024-11-25: 11:00:00 300 ug via SUBCUTANEOUS
  Filled 2024-11-25: qty 0.5

## 2024-11-25 NOTE — Telephone Encounter (Signed)
 Left message for pt to call back to potentially schedule to be seen today or tomorrow.

## 2024-11-25 NOTE — Telephone Encounter (Signed)
 Left message to call back  What dose BB did he resume?  At time of call, opening with CANDIE Ferrier PA on 12/17

## 2024-11-25 NOTE — Telephone Encounter (Signed)
°  Per MyChart scheduling message:  Pt c/o BP issue: STAT if pt c/o blurred vision, one-sided weakness or slurred speech.  STAT if BP is GREATER than 180/120 TODAY.  STAT if BP is LESS than 90/60 and SYMPTOMATIC TODAY  1. What is your BP concern?   2. Have you taken any BP medication today?  3. What are your last 5 BP readings?  4. Are you having any other symptoms (ex. Dizziness, headache, blurred vision, passed out)?   83  No log but it's been over 100 at 3 am I just put myself back on beat blocker cause no one would give me a answer. No other symptoms just fast heart rate sid have angina the other night but nitro Took car of it and only one time

## 2024-11-26 ENCOUNTER — Inpatient Hospital Stay

## 2024-11-26 ENCOUNTER — Ambulatory Visit
Admission: RE | Admit: 2024-11-26 | Discharge: 2024-11-26 | Disposition: A | Source: Ambulatory Visit | Attending: Radiation Oncology

## 2024-11-26 ENCOUNTER — Other Ambulatory Visit: Payer: Self-pay

## 2024-11-26 ENCOUNTER — Other Ambulatory Visit (HOSPITAL_COMMUNITY): Payer: Self-pay

## 2024-11-26 ENCOUNTER — Ambulatory Visit

## 2024-11-26 VITALS — BP 100/67 | HR 98 | Temp 98.3°F | Resp 18

## 2024-11-26 DIAGNOSIS — Z510 Encounter for antineoplastic radiation therapy: Secondary | ICD-10-CM | POA: Diagnosis not present

## 2024-11-26 DIAGNOSIS — D701 Agranulocytosis secondary to cancer chemotherapy: Secondary | ICD-10-CM

## 2024-11-26 DIAGNOSIS — C12 Malignant neoplasm of pyriform sinus: Secondary | ICD-10-CM

## 2024-11-26 LAB — RAD ONC ARIA SESSION SUMMARY
Course Elapsed Days: 54
Plan Fractions Treated to Date: 28
Plan Prescribed Dose Per Fraction: 2 Gy
Plan Total Fractions Prescribed: 35
Plan Total Prescribed Dose: 70 Gy
Reference Point Dosage Given to Date: 56 Gy
Reference Point Session Dosage Given: 2 Gy
Session Number: 28

## 2024-11-26 MED ORDER — FILGRASTIM-SNDZ 300 MCG/0.5ML IJ SOSY
300.0000 ug | PREFILLED_SYRINGE | Freq: Once | INTRAMUSCULAR | Status: AC
  Administered 2024-11-26: 11:00:00 300 ug via SUBCUTANEOUS
  Filled 2024-11-26: qty 0.5

## 2024-11-26 NOTE — Progress Notes (Deleted)
 Cardiology Office Note:    Date:  11/26/2024   ID:  Dave Williams, DOB 06/17/1956, MRN [id number]  PCP:  Myra Geni ORN, FNP  Cardiologist:  Dorn Lesches, MD { Click to update primary MD,subspecialty MD or APP then REFRESH:1}    Referring MD: Myra Geni ORN, FNP   Chief Complaint: tachycardia   History of Present Illness:    Dave Williams is a 67 y.o. male with a history of CAD s/p CABG x3 (LIMA-LAD, SVG-PDA, SVG-OM) in 02/2024, post-op atrial flutter no longer on anticoagulation, CVA in 2022, hypertension, hyperlipidemia, chronic hip pain, and cancer of pyriform sinus who is followed by Dr. Lesches and presents today for evaluation of tachycardia.   Patient was referred to Dr. Lesches in 11/2023 for further evaluation of chest pain. Outpatient LHC was arranged and showed severe 3 vessel CAD. He was referred to CT surgery and CABG was recommended. Echo prior to surgery showed LVEF of 65-70% with normal wall motion and grade 1 diastolic dysfunction, normal RV function, and no significant valvular disease. He underwent successful CABG x3 with LIMA to LAD, SVG to PDA, and SVG to OM in 02/2024. Post-op course was complicated by atrial flutter requiring Amiodarone  and Eliquis  and urinary retention requiring Foley. He has had some nocturnal angina which has improved with addition of Amlodipine  and Imdur . He was maintaining sinus rhythm at last visit with Dr. Lesches in 07/2024 so Amiodarone  and Eliquis  were discontinued.   Today's visit was arranged for further evaluation of an elevated heart rate. ***  Tachycardia Post-Op Atrial Flutter Patient developed post-op atrial flutter after CABG in 02/2024. He was on Amiodarone  and Eliquis  for several months but these were stopped in 07/2024.  - *** - EKG today shows *** - Continue *** - Dr. Lesches recently ordered a 30 Day Event Monitor. Will wait for full results of this. ***  CAD S/p CABG x3 in 02/2024.  - *** - Continue antianginals: Amlodipine   10mg  daily and Imdur  30mg  daily.  - Continue Aspirin  81mg  daily and Lipitor  80mg  daily.   Hypertension BP *** - Continue Amlodipine  and Imdur  as above.  Hyperlipidemia LDL 50 in 02/2024: - Continue Lipitor  80mg  daily.   EKGs/Labs/Other Studies Reviewed:    The following studies were reviewed:  Left Cardiac Catheterization 11/28/2023: 3 vessel obstructive CAD. Suspect the culprit is the mid LCx bifurcation lesion with OM2 and OM3 Normal LVEDP Normal LV function   Plan: the LCx is a complex lesion. Given multivessel disease would recommend consideration for CABG. Will increase antianginal therapy with addition of Toprol  XL 25 mg daily and Imdur  30 mg daily. Will arrange outpatient Echo and CT surgery consultation. Patient instructed to return to hospital if he has recurrent pain that does not respond to one sl Ntg. If after discussion patient does not want to consider surgery then we could perform complex stenting of the LCx and the mid RCA could also be stented. Recommend smoking cessation and optimization of lipid lowering therapy  Diagnostic Dominance: Co-dominant  _______________  Echocardiogram 11/30/2023: Impressions: 1. Left ventricular ejection fraction, by estimation, is 65 to 70%. The  left ventricle has normal function. The left ventricle has no regional  wall motion abnormalities. Left ventricular diastolic parameters are  consistent with Grade I diastolic  dysfunction (impaired relaxation).   2. Right ventricular systolic function is normal. The right ventricular  size is normal.   3. The mitral valve is normal in structure. Trivial mitral valve  regurgitation. No evidence of  mitral stenosis.   4. The aortic valve is tricuspid. There is mild calcification of the  aortic valve. Aortic valve regurgitation is not visualized. Aortic valve  sclerosis/calcification is present, without any evidence of aortic  stenosis.   5. The inferior vena cava is normal in size with  greater than 50%  respiratory variability, suggesting right atrial pressure of 3 mmHg.    EKG:  EKG ordered today. EKG personally reviewed and demonstrates ***.  Recent Labs: 08/07/2024: ALT 14; TSH 2.220 11/21/2024: BUN 17; Creatinine 0.97; Hemoglobin 10.1; Magnesium  2.2; Platelet Count 168; Potassium 4.4; Sodium 135  Recent Lipid Panel    Component Value Date/Time   CHOL 94 03/06/2024 0430   TRIG 112 03/06/2024 0430   HDL 22 (L) 03/06/2024 0430   CHOLHDL 4.3 03/06/2024 0430   VLDL 22 03/06/2024 0430   LDLCALC 50 03/06/2024 0430    Physical Exam:    Vital Signs: There were no vitals taken for this visit.    Wt Readings from Last 3 Encounters:  11/22/24 220 lb 12.8 oz (100.2 kg)  11/21/24 222 lb 12.8 oz (101.1 kg)  11/14/24 224 lb (101.6 kg)     General: 68 y.o. male in no acute distress. HEENT: Normocephalic and atraumatic. Sclera clear.  Neck: Supple. No carotid bruits. No JVD. Heart: *** RRR. Distinct S1 and S2. No murmurs, gallops, or rubs.  Lungs: No increased work of breathing. Clear to ausculation bilaterally. No wheezes, rhonchi, or rales.  Abdomen: Soft, non-distended, and non-tender to palpation.  Extremities: No lower extremity edema.  Radial and distal pedal pulses 2+ and equal bilaterally. Skin: Warm and dry. Neuro: No focal deficits. Psych: Normal affect. Responds appropriately.   Assessment:    No diagnosis found.  Plan:     Disposition: Follow up in ***   Signed, Aline FORBES Door, PA-C  11/26/2024 9:44 AM    [facility]

## 2024-11-26 NOTE — Progress Notes (Unsigned)
 Cardiology Office Note   Date: 11/28/2024  ID:  Dave Williams [birth year] [id number]  PCP: Dave Geni ORN, FNP  [facility] Providers Cardiologist: Dave Lesches, MD     Chief Complaint: Dave Williams is a 68 y.o.male with PMH of CAD s/p CABGx3 (LIMA-LAD, SVG-PDA, SVG-OM) 02/2024, SVT/AFL post-operatively no longer on [facility], hypertension, hyperlipidemia, CVA 2022, and cancer of the pyriform sinus who presents to the clinic for evaluation of tachycardia.    Mr. Dave Williams established care with Dr. Lesches 11/2023 for further evaluation of chest pain. Outpatient cardiac catheterization 11/27/23 showed MVD and he was referred to [facility]. CABGx3 (LIMA-LAD, SVG-PDA and OM) performed 02/29/24 by Dr. Shyrl. Post-operatively he developed atrial flutter requiring Amiodarone  and Eliquis . He was discharged to [facility] 03/13/24. Had nocturnal chest discomfort when seen in clinic after discharge. Imdur  and amlodipine  were added and symptoms improved. EKG 07/03/24 showed NSR and Dr. Lesches discontinued Eliquis  and amiodarone .  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 Finished cisplatin  chemotherapy 11/22/24 and will finish radiation 12/03/24. Had PEG tube placed due to poor PO intake from chemotherapy.     History of Present Illness: Today he comes for evaluation of tachycardia that is present every time he checks his HR for the last month. He checks it with a pulse oximeter at different times of the day and it is consistently between 90 and 110. Has restarted metoprolol  (dose is unclear) every night before he goes to bed for the last week. Wakes up around 0300 with palpitations, moves to the recliner and symptoms resolve. Also notes frequent episodes of sweating and dizziness in the morning about an hour after he wakes up. Denies syncope. Has not received blood products at the cancer center. Only PO intake is water with pills, administers 8  bottles of tube feeding to himself per day. Has not smoked since CABG. Activity limited but denies worsened tachycardia or palpitations with exertion. No chest pain, peripheral edema.   ROS: Please see the history of present illness. All other systems reviewed and are negative.   Studies Reviewed: The following studies were personally reviewed today: Cardiac Studies & Procedures   ______________________________________________________________________________________________ CARDIAC CATHETERIZATION  CARDIAC CATHETERIZATION 11/28/2023  Conclusion 3 vessel obstructive CAD. Suspect the culprit is the mid LCx bifurcation lesion with OM2 and OM3 Normal LVEDP Normal LV function  Plan: the LCx is a complex lesion. Given multivessel disease would recommend consideration for CABG. Will increase antianginal therapy with addition of Toprol  XL 25 mg daily and Imdur  30 mg daily. Will arrange outpatient Echo and CT surgery consultation. Patient instructed to return to hospital if he has recurrent pain that does not respond to one sl Ntg. If after discussion patient does not want to consider surgery then we could perform complex stenting of the LCx and the mid RCA could also be stented. Recommend smoking cessation and optimization of lipid lowering therapy  Findings Coronary Findings Diagnostic  Dominance: Co-dominant  Left Main Vessel was injected. Vessel is normal in caliber. Vessel is angiographically normal.  Left Anterior Descending Prox LAD to Mid LAD lesion is 70% stenosed.  Left Circumflex Mid Cx lesion is 95% stenosed. The lesion is located at the bifurcation. Dist Cx lesion is 80% stenosed.  First Obtuse Marginal Branch Vessel is small in size.  Right Coronary Artery Prox RCA to Mid RCA lesion is 90% stenosed. The lesion is segmental.  Intervention  No interventions have been documented.  ECHOCARDIOGRAM  ECHOCARDIOGRAM COMPLETE 11/30/2023  Narrative ECHOCARDIOGRAM  REPORT    Patient Name:   Dave Williams Date of Exam: 11/30/2023 Medical Rec #:  [id number]        Height:       75.0 in Accession #:    [id number]       Weight:       228.0 lb Date of Birth:  Oct 17, 1956        BSA:          2.320 m Patient Age:    67 years         BP:           120/83 mmHg Patient Gender: M                HR:           67 bpm. Exam Location:  [address]  Procedure: 3D Echo, 2D Echo, Color Doppler and Cardiac Doppler  Indications:    R07.9 Chest Pain  History:        Patient has no prior history of Echocardiogram examinations. Stroke, Signs/Symptoms:Fatigue, Shortness of Breath and Chest Pain; Risk Factors:Family History of Coronary Artery Disease, Hypertension, Dyslipidemia and Current Smoker. History of Polysubstance abuse (ETOH and Cocaine, quit approx 30 years ago).  Sonographer:    Dave Williams RDCS Referring Phys: Dave Williams  IMPRESSIONS   1. Left ventricular ejection fraction, by estimation, is 65 to 70%. The left ventricle has normal function. The left ventricle has no regional wall motion abnormalities. Left ventricular diastolic parameters are consistent with Grade I diastolic dysfunction (impaired relaxation). 2. Right ventricular systolic function is normal. The right ventricular size is normal. 3. The mitral valve is normal in structure. Trivial mitral valve regurgitation. No evidence of mitral stenosis. 4. The aortic valve is tricuspid. There is mild calcification of the aortic valve. Aortic valve regurgitation is not visualized. Aortic valve sclerosis/calcification is present, without any evidence of aortic stenosis. 5. The inferior vena cava is normal in size with greater than 50% respiratory variability, suggesting right atrial pressure of 3 mmHg.  FINDINGS Left Ventricle: Left ventricular ejection fraction, by estimation, is 65 to 70%. The left ventricle has normal function. The left ventricle has no regional wall motion  abnormalities. The left ventricular internal cavity size was normal in size. There is no left ventricular hypertrophy. Left ventricular diastolic parameters are consistent with Grade I diastolic dysfunction (impaired relaxation).  Right Ventricle: The right ventricular size is normal. No increase in right ventricular wall thickness. Right ventricular systolic function is normal.  Left Atrium: Left atrial size was normal in size.  Right Atrium: Right atrial size was normal in size.  Pericardium: There is no evidence of pericardial effusion.  Mitral Valve: The mitral valve is normal in structure. Trivial mitral valve regurgitation. No evidence of mitral valve stenosis.  Tricuspid Valve: The tricuspid valve is normal in structure. Tricuspid valve regurgitation is trivial. No evidence of tricuspid stenosis.  Aortic Valve: The aortic valve is tricuspid. There is mild calcification of the aortic valve. Aortic valve regurgitation is not visualized. Aortic valve sclerosis/calcification is present, without any evidence of aortic stenosis.  Pulmonic Valve: The pulmonic valve was normal in structure. Pulmonic valve regurgitation is trivial. No evidence of pulmonic stenosis.  Aorta: The aortic root is normal in size and structure.  Venous: The inferior vena cava is normal in size with greater than 50% respiratory variability, suggesting right atrial pressure of 3 mmHg.  IAS/Shunts: No  atrial level shunt detected by color flow Doppler.   LEFT VENTRICLE PLAX 2D LVIDd:         5.40 cm   Diastology LVIDs:         2.80 cm   LV e' medial:    9.46 cm/s LV PW:         0.80 cm   LV E/e' medial:  9.4 LV IVS:        0.70 cm   LV e' lateral:   11.10 cm/s LVOT diam:     2.80 cm   LV E/e' lateral: 8.0 LV SV:         129 LV SV Index:   56 LVOT Area:     6.16 cm  3D Volume EF: 3D EF:        66 % LV EDV:       137 ml LV ESV:       47 ml LV SV:        90 ml  RIGHT VENTRICLE RV Basal diam:  4.30 cm RV  Mid diam:    3.70 cm RV S prime:     17.20 cm/s TAPSE (M-mode): 2.8 cm RVSP:           26.6 mmHg  LEFT ATRIUM             Index        RIGHT ATRIUM           Index LA diam:        4.00 cm 1.72 cm/m   RA Pressure: 3.00 mmHg LA Vol (A2C):   78.5 ml 33.84 ml/m  RA Area:     18.60 cm LA Vol (A4C):   71.6 ml 30.86 ml/m  RA Volume:   55.00 ml  23.71 ml/m LA Biplane Vol: 74.5 ml 32.11 ml/m AORTIC VALVE LVOT Vmax:   93.30 cm/s LVOT Vmean:  64.950 cm/s LVOT VTI:    0.210 m  AORTA Ao Root diam: 3.50 cm Ao Asc diam:  3.40 cm  MITRAL VALVE                TRICUSPID VALVE MV Area (PHT)  cm          TR Peak grad:   23.6 mmHg MV Decel Time: 264 msec     TR Vmax:        243.00 cm/s MV E velocity: 88.80 cm/s   Estimated RAP:  3.00 mmHg MV A velocity: 110.50 cm/s  RVSP:           26.6 mmHg MV E/A ratio:  0.80 SHUNTS Systemic VTI:  0.21 m Systemic Diam: 2.80 cm  Toribio Fuel MD Electronically signed by Toribio Fuel MD Signature Date/Time: 11/30/2023/4:53:23 PM    Final   TEE  ECHO INTRAOPERATIVE TEE 03/04/2024  Narrative *INTRAOPERATIVE TRANSESOPHAGEAL REPORT *    Patient Name:   EULA MAZZOLA Date of Exam: 03/04/2024 Medical Rec #:  [id number]         Height: Accession #:    [id number]       Weight: Date of Birth:  1956/03/19        BSA: Patient Age:    68 years         BP:           140/70 mmHg Patient Gender: M                HR:           70 bpm.  Exam Location:  Anesthesiology  Transesophogeal exam was perform intraoperatively during surgical procedure. Patient was closely monitored under general anesthesia during the entirety of examination.  Indications:     CAD Performing Phys: Debby Like MD Diagnosing Phys: Debby Like MD  Complications: No known complications during this procedure. POST-OP IMPRESSIONS _ Left Ventricle: The left ventricle is unchanged from pre-bypass. _ Right Ventricle: The right ventricle appears unchanged from pre-bypass. _  Aorta: The aorta appears unchanged from pre-bypass. _ Left Atrium: The left atrium appears unchanged from pre-bypass. _ Left Atrial Appendage: The left atrial appendage appears unchanged from pre-bypass. _ Aortic Valve: The aortic valve appears unchanged from pre-bypass. _ Mitral Valve: The mitral valve appears unchanged from pre-bypass. _ Tricuspid Valve: The tricuspid valve appears unchanged from pre-bypass. _ Pulmonic Valve: The pulmonic valve appears unchanged from pre-bypass. _ Interatrial Septum: The interatrial septum appears unchanged from pre-bypass. _ Interventricular Septum: The interventricular septum appears unchanged from pre-bypass. _ Pericardium: The pericardium appears unchanged from pre-bypass.  PRE-OP FINDINGS Left Ventricle: The left ventricle has normal systolic function, with an ejection fraction of 55-60%. The cavity size was normal. No evidence of left ventricular regional wall motion abnormalities. There is no left ventricular hypertrophy.   Right Ventricle: The right ventricle has normal systolic function. The cavity was normal. There is no increase in right ventricular wall thickness.  Left Atrium: Left atrial size was normal in size. No left atrial/left atrial appendage thrombus was detected.  Right Atrium: Right atrial size was normal in size.  Interatrial Septum: No atrial level shunt detected by color flow Doppler.  Pericardium: There is no evidence of pericardial effusion.  Mitral Valve: The mitral valve is normal in structure. Mitral valve regurgitation is trivial by color flow Doppler. There is No evidence of mitral stenosis.  Tricuspid Valve: The tricuspid valve was normal in structure. Tricuspid valve regurgitation is trivial by color flow Doppler. No evidence of tricuspid stenosis is present.  Aortic Valve: The aortic valve is tricuspid Aortic valve regurgitation was not visualized by color flow Doppler. There is no stenosis of the aortic  valve.   Pulmonic Valve: The pulmonic valve was normal in structure. Pulmonic valve regurgitation is trivial by color flow Doppler.   Aorta: The aortic root, ascending aorta and aortic arch are normal in size and structure.   Debby Like MD Electronically signed by Debby Like MD Signature Date/Time: 03/04/2024/12:40:58 PM    Final        ______________________________________________________________________________________________      EKG Interpretation Date/Time:  Thursday November 28 2024 13:16:38 EST Ventricular Rate:  96 PR Interval:  126 QRS Duration:  92 QT Interval:  354 QTC Calculation: 447 R Axis:   -36 Text Interpretation: Normal sinus rhythm Left axis deviation Nonspecific ST abnormality Confirmed by Nola Numbers ([id number]) on 11/28/2024 5:37:24 PM   Physical Exam: BP 110/70   Pulse 99   Ht 6' 1 (1.854 m)   Wt 216 lb (98 kg)   SpO2 90%   BMI 28.50 kg/m   GEN: Thin, NAD NECK: No JVD CARDIAC: RRR, no murmurs, rubs, gallops RESPIRATORY:  Clear to auscultation without rales, wheezing or rhonchi  ABDOMEN: Soft, PEG tube in place EXTREMITIES:  No edema, 2+ radial and pedal pulses  Assessment & Plan: 1. Tachycardia: EKG shows NSR 96 bpm. Did have AFL after CABG. He was on Amiodarone  and Eliquis  but these were discontinued 07/2024. Tachycardia likely multifactorial, but differential includes anemia, dehydration, chemotherapy-induced, hyperthyroidism. Unclear why metoprolol  was stopped in  the past but possibly due to orthostatic hypotension in CIR. Denies syncopal episodes. - 30-day event monitor already ordered by Dr. Court - awaiting to receive this in the mail - instructed to notify the office if not received by 12/02/24 - He will send MyChart message of metoprolol  dose - likely restart metoprolol  succinate 25 mg daily - Check TSH today - was normal after stopping Amiodarone  07/2024 - Repeat echo given recent chemotherapy treatment  2. CAD: S/p CABGx3  02/29/24. Denies chest pain, DOE. - Continue aspirin  81 mg daily and atorvastatin  80 mg daily - Likely restart metoprolol  succinate 25 mg daily as above - Continue amlodipine  10 mg daily and Imdur  30 mg daily for antianginal effect  3. Hypertension: BP today 110/70, well-controlled. - Continue amlodipine  and Imdur  as above  4. Hyperlipidemia: 03/06/24 LDL 50, HDL 22, TGs 112, total 94. 08/07/24 AST 16, ALT 14. - Continue atorvastatin  80 mg daily    Dispo: Keep appointment with Lum Louis, NP 01/22/25. See sooner if needed.  Signed, Saddie GORMAN Cleaves, NP 11/28/2024 5:50 PM [facility]

## 2024-11-27 ENCOUNTER — Inpatient Hospital Stay

## 2024-11-27 ENCOUNTER — Other Ambulatory Visit: Payer: Self-pay

## 2024-11-27 ENCOUNTER — Ambulatory Visit: Admission: RE | Admit: 2024-11-27 | Discharge: 2024-11-27 | Attending: Radiation Oncology

## 2024-11-27 ENCOUNTER — Ambulatory Visit

## 2024-11-27 VITALS — BP 111/80 | HR 98 | Temp 98.0°F | Resp 17

## 2024-11-27 DIAGNOSIS — C12 Malignant neoplasm of pyriform sinus: Secondary | ICD-10-CM

## 2024-11-27 DIAGNOSIS — Z510 Encounter for antineoplastic radiation therapy: Secondary | ICD-10-CM | POA: Diagnosis not present

## 2024-11-27 DIAGNOSIS — T451X5A Adverse effect of antineoplastic and immunosuppressive drugs, initial encounter: Secondary | ICD-10-CM

## 2024-11-27 LAB — RAD ONC ARIA SESSION SUMMARY
Course Elapsed Days: 55
Plan Fractions Treated to Date: 29
Plan Prescribed Dose Per Fraction: 2 Gy
Plan Total Fractions Prescribed: 35
Plan Total Prescribed Dose: 70 Gy
Reference Point Dosage Given to Date: 58 Gy
Reference Point Session Dosage Given: 2 Gy
Session Number: 29

## 2024-11-27 MED ORDER — FILGRASTIM-SNDZ 300 MCG/0.5ML IJ SOSY
300.0000 ug | PREFILLED_SYRINGE | Freq: Once | INTRAMUSCULAR | Status: AC
  Administered 2024-11-27: 10:00:00 300 ug via SUBCUTANEOUS
  Filled 2024-11-27: qty 0.5

## 2024-11-28 ENCOUNTER — Encounter: Payer: Self-pay | Admitting: Student

## 2024-11-28 ENCOUNTER — Ambulatory Visit

## 2024-11-28 ENCOUNTER — Ambulatory Visit: Admission: RE | Admit: 2024-11-28 | Discharge: 2024-11-28 | Attending: Radiation Oncology

## 2024-11-28 ENCOUNTER — Other Ambulatory Visit: Payer: Self-pay

## 2024-11-28 ENCOUNTER — Other Ambulatory Visit: Payer: Self-pay | Admitting: Oncology

## 2024-11-28 ENCOUNTER — Ambulatory Visit: Admitting: Student

## 2024-11-28 VITALS — BP 110/70 | HR 99 | Ht 73.0 in | Wt 216.0 lb

## 2024-11-28 DIAGNOSIS — I10 Essential (primary) hypertension: Secondary | ICD-10-CM

## 2024-11-28 DIAGNOSIS — E782 Mixed hyperlipidemia: Secondary | ICD-10-CM

## 2024-11-28 DIAGNOSIS — I5032 Chronic diastolic (congestive) heart failure: Secondary | ICD-10-CM | POA: Diagnosis not present

## 2024-11-28 DIAGNOSIS — I2510 Atherosclerotic heart disease of native coronary artery without angina pectoris: Secondary | ICD-10-CM

## 2024-11-28 DIAGNOSIS — I479 Paroxysmal tachycardia, unspecified: Secondary | ICD-10-CM

## 2024-11-28 DIAGNOSIS — C12 Malignant neoplasm of pyriform sinus: Secondary | ICD-10-CM

## 2024-11-28 DIAGNOSIS — Z510 Encounter for antineoplastic radiation therapy: Secondary | ICD-10-CM | POA: Diagnosis not present

## 2024-11-28 LAB — RAD ONC ARIA SESSION SUMMARY
Course Elapsed Days: 56
Plan Fractions Treated to Date: 30
Plan Prescribed Dose Per Fraction: 2 Gy
Plan Total Fractions Prescribed: 35
Plan Total Prescribed Dose: 70 Gy
Reference Point Dosage Given to Date: 60 Gy
Reference Point Session Dosage Given: 2 Gy
Session Number: 30

## 2024-11-28 NOTE — Patient Instructions (Signed)
 Thank you for choosing [facility]!     Medication Instructions:  No medication changes were made during today's visit.  *If you need a refill on your cardiac medications before your next appointment, please call your pharmacy*   Lab Work: TSH If you have labs (blood work) drawn today and your tests are completely normal, you will receive your results only by: MyChart Message (if you have MyChart) OR A paper copy in the mail If you have any lab test that is abnormal or we need to change your treatment, we will call you to review the results.   Testing/Procedures: Your physician has requested that you have an echocardiogram. Echocardiography is a painless test that uses sound waves to create images of your heart. It provides your doctor with information about the size and shape of your heart and how well your hearts chambers and valves are working. This procedure takes approximately one hour. There are no restrictions for this procedure. Please do NOT wear cologne, aftershave, or lotions (deodorant is allowed).  Please arrive 15 minutes prior to your appointment time.    Follow-Up: At [facility], you and your health needs are our priority.  As part of our continuing mission to provide you with exceptional heart care, we have created designated Provider Care Teams.  These Care Teams include your primary Cardiologist (physician) and Advanced Practice Providers (APPs -  Physician Assistants and Nurse Practitioners) who all work together to provide you with the care you need, when you need it. We recommend signing up for the patient portal called MyChart.  Sign up information is provided on this After Visit Summary.  MyChart is used to connect with patients for Virtual Visits (Telemedicine).  Patients are able to view lab/test results, encounter notes, upcoming appointments, etc.  Non-urgent messages can be sent to your provider as well.   To learn more about what you can  do with MyChart, go to [email or web address].

## 2024-11-29 ENCOUNTER — Ambulatory Visit: Payer: Self-pay

## 2024-11-29 ENCOUNTER — Inpatient Hospital Stay

## 2024-11-29 ENCOUNTER — Inpatient Hospital Stay: Admitting: Oncology

## 2024-11-29 ENCOUNTER — Ambulatory Visit: Admission: RE | Admit: 2024-11-29 | Discharge: 2024-11-29 | Attending: Radiation Oncology

## 2024-11-29 ENCOUNTER — Encounter: Payer: Self-pay | Admitting: Oncology

## 2024-11-29 ENCOUNTER — Inpatient Hospital Stay: Admitting: Nutrition

## 2024-11-29 ENCOUNTER — Other Ambulatory Visit: Payer: Self-pay

## 2024-11-29 VITALS — BP 131/86 | HR 94 | Temp 97.9°F | Resp 18 | Ht 73.0 in | Wt 216.4 lb

## 2024-11-29 DIAGNOSIS — C12 Malignant neoplasm of pyriform sinus: Secondary | ICD-10-CM | POA: Diagnosis not present

## 2024-11-29 DIAGNOSIS — Z510 Encounter for antineoplastic radiation therapy: Secondary | ICD-10-CM | POA: Diagnosis not present

## 2024-11-29 LAB — CBC WITH DIFFERENTIAL (CANCER CENTER ONLY)
Abs Immature Granulocytes: 1.63 K/uL — ABNORMAL HIGH (ref 0.00–0.07)
Basophils Absolute: 0.1 K/uL (ref 0.0–0.1)
Basophils Relative: 1 %
Eosinophils Absolute: 0 K/uL (ref 0.0–0.5)
Eosinophils Relative: 0 %
HCT: 31.5 % — ABNORMAL LOW (ref 39.0–52.0)
Hemoglobin: 10.7 g/dL — ABNORMAL LOW (ref 13.0–17.0)
Immature Granulocytes: 17 %
Lymphocytes Relative: 8 %
Lymphs Abs: 0.8 K/uL (ref 0.7–4.0)
MCH: 31.2 pg (ref 26.0–34.0)
MCHC: 34 g/dL (ref 30.0–36.0)
MCV: 91.8 fL (ref 80.0–100.0)
Monocytes Absolute: 2.1 K/uL — ABNORMAL HIGH (ref 0.1–1.0)
Monocytes Relative: 22 %
Neutro Abs: 4.9 K/uL (ref 1.7–7.7)
Neutrophils Relative %: 52 %
Platelet Count: 302 K/uL (ref 150–400)
RBC: 3.43 MIL/uL — ABNORMAL LOW (ref 4.22–5.81)
RDW: 16.8 % — ABNORMAL HIGH (ref 11.5–15.5)
Smear Review: NORMAL
WBC Count: 9.5 K/uL (ref 4.0–10.5)
nRBC: 1.9 % — ABNORMAL HIGH (ref 0.0–0.2)

## 2024-11-29 LAB — RAD ONC ARIA SESSION SUMMARY
Course Elapsed Days: 57
Plan Fractions Treated to Date: 31
Plan Prescribed Dose Per Fraction: 2 Gy
Plan Total Fractions Prescribed: 35
Plan Total Prescribed Dose: 70 Gy
Reference Point Dosage Given to Date: 62 Gy
Reference Point Session Dosage Given: 2 Gy
Session Number: 31

## 2024-11-29 LAB — TSH: TSH: 0.649 u[IU]/mL (ref 0.450–4.500)

## 2024-11-29 LAB — BASIC METABOLIC PANEL - CANCER CENTER ONLY
Anion gap: 11 (ref 5–15)
BUN: 19 mg/dL (ref 8–23)
CO2: 26 mmol/L (ref 22–32)
Calcium: 9.3 mg/dL (ref 8.9–10.3)
Chloride: 97 mmol/L — ABNORMAL LOW (ref 98–111)
Creatinine: 0.98 mg/dL (ref 0.61–1.24)
GFR, Estimated: >60 mL/min
Glucose, Bld: 103 mg/dL — ABNORMAL HIGH (ref 70–99)
Potassium: 4.5 mmol/L (ref 3.5–5.1)
Sodium: 134 mmol/L — ABNORMAL LOW (ref 135–145)

## 2024-11-29 LAB — MAGNESIUM: Magnesium: 2.3 mg/dL (ref 1.7–2.4)

## 2024-11-29 LAB — PHOSPHORUS: Phosphorus: 3.7 mg/dL (ref 2.5–4.6)

## 2024-11-29 NOTE — Assessment & Plan Note (Signed)
 Please review oncology history for additional details and timeline of events.  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  Patient declined surgical option.  Hence plan made to proceed with concurrent chemoradiation.  Renal function is fluctuating and this needs to be monitored closely. The cancer has a high risk of recurrence, necessitating aggressive treatment.  We have discussed about role of cisplatin  being a radiosensitizer in the treatment of head and neck cancer.  We have discussed about the curative intent of chemoradiation for this patient.  Patient was willing to proceed with weekly cisplatin .  Concurrent chemoradiation with weekly cisplatin  started from 10/03/2024.  He has been tolerating chemotherapy reasonably well.   Previously he wanted to defer feeding tube placement but because of progressive weight loss, recently he underwent feeding tube placement and he is using it as directed.  Completed cycle 7 of chemotherapy with cisplatin  on 11/22/2024.  We will not plan for further chemotherapy at this time after completion of cycle 7, especially given progressive cytopenias, nausea, vomiting and fatigue.  His last scheduled radiation session is on 12/04/2024.  Radiation appointments had to be extended because of missed appointments previously.  - Encourage hydration to maintain kidney function, especially if not using a feeding tube.  Appropriate referrals were previously made for supportive care including physical therapy, speech pathology, nutrition.  Labs today revealed normal white count at 9500 with normal differential.  Hemoglobin better at 10.7.  Creatinine normal at 0.98.  No electrolyte disturbances.  Since patient has completed chemotherapy and is completing planned radiation treatments, I will see him in approximately 2 months for follow-up.

## 2024-11-29 NOTE — Progress Notes (Signed)
 Brief telephone follow-up completed with patient for stage IV cancer of the piriform sinus.  Patient has completed chemotherapy with weekly cisplatin  and is scheduled for his final radiation treatment on Wednesday, December 24.  PEG: November 25 DME: [employer]   Weight:  216 pounds December 18 222.8 pounds December 11 219 pounds 8 ounces November 26 234 pounds November 13   No new labs.   Estimated nutrition needs:  2700-3080 cal, 132-154 g protein, greater than 3 L fluid.   Tube feeding: 8 cartons Nutren 1.5 with free water flushes provides 3000 cal, 136 g protein and 2908 mL free water   States he is using 2 cartons of Nutren 1.5 4 times daily with good tolerance.  He uses 60 mL of free water before and after bolus feeding.  He is drinking 60 ounces of water minimum every day.  He denies nausea and vomiting.  His bowels are moving.    His weight continues to decline however, I expect weight to stabilize as patient is receiving 100% of estimated needs.  Nutrition diagnosis:  Food and nutrition related knowledge deficit, resolved Inadequate oral intake, continues.  Nutrition needs met via PEG.   Intervention: Continue 2 cartons Nutren 1.5 4 times daily with 60 mL free water before and after feedings.  Flush with a minimum of 300 mL free water 3 times a day. Eat and drink by mouth as tolerated.  Monitoring, evaluation, goals: Patient will tolerate adequate calories and protein to minimize loss of lean body mass.  Next visit: Will call patient on Tuesday, December 30.  **Disclaimer: This note was dictated with voice recognition software. Similar sounding words can inadvertently be transcribed and this note may contain transcription errors which may not have been corrected upon publication of note.**

## 2024-11-29 NOTE — Progress Notes (Signed)
 "  [facility]  ONCOLOGY CLINIC PROGRESS NOTE   Patient Care Team: Myra Geni ORN, FNP as PCP - General (Family Medicine) Court Dorn PARAS, MD as PCP - Cardiology (Cardiology) Malmfelt, Delon CROME, RN as Oncology Nurse Navigator Izell Domino, MD as Consulting Physician (Radiation Oncology) Autumn Millman, MD as Consulting Physician (Oncology) Tobie Eldora NOVAK, MD as Consulting Physician (Otolaryngology)  PATIENT NAME: Dave Williams   MR#: [id number] DOB: [birth year]  Date of visit: 11/29/2024   ASSESSMENT & PLAN:   Dave Williams is a 68 y.o. 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   Cancer of pyriform sinus (HCC) Please review oncology history for additional details and timeline of events.  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  Patient declined surgical option.  Hence plan made to proceed with concurrent chemoradiation.  Renal function is fluctuating and this needs to be monitored closely. The cancer has a high risk of recurrence, necessitating aggressive treatment.  We have discussed about role of cisplatin  being a radiosensitizer in the treatment of head and neck cancer.  We have discussed about the curative intent of chemoradiation for this patient.  Patient was willing to proceed with weekly cisplatin .  Concurrent chemoradiation with weekly cisplatin  started from 10/03/2024.  He has been tolerating chemotherapy reasonably well.   Previously he wanted to defer feeding tube placement but because of progressive weight loss, recently he underwent feeding tube placement and he is using it as directed.  Completed cycle 7 of  chemotherapy with cisplatin  on 11/22/2024.  We will not plan for further chemotherapy at this time after completion of cycle 7, especially given progressive cytopenias, nausea, vomiting and fatigue.  His last scheduled radiation session is on 12/04/2024.  Radiation appointments had to be extended because of missed appointments previously.  - Encourage hydration to maintain kidney function, especially if not using a feeding tube.  Appropriate referrals were previously made for supportive care including physical therapy, speech pathology, nutrition.  Labs today revealed normal white count at 9500 with normal differential.  Hemoglobin better at 10.7.  Creatinine normal at 0.98.  No electrolyte disturbances.  Since patient has completed chemotherapy and is completing planned radiation treatments, I will see him in approximately 2 months for follow-up.  Neoplasm-related pain He continues to experience severe oropharyngeal pain secondary to ongoing radiation therapy. He is using oxycodone /acetaminophen  7.5 mg with increased dosing for improved pain control. Pain remains significant but is managed with the current regimen. - Continue oxycodone /acetaminophen  7.5 mg as needed.  Feeding difficulties managed with gastrostomy He is managing feeding difficulties with gastrostomy tube feeds, currently receiving approximately 3000 calories per day over eight feedings. He missed some feedings due to fatigue and early bedtime. - Continue gastrostomy tube feeds at 3000 calories/day in eight feedings. - Encouraged to maintain adequate nutritional intake.  Chemotherapy-induced cytopenia, resolved Blood counts have recovered following completion of chemotherapy. Current labs show normalization of white blood cell and platelet counts, with improving hemoglobin. No further cytopenias noted. - Reviewed recent blood counts, now within normal limits.  Tachycardia He is using a cardiac monitor to assess rhythm and  rate. - Apply cardiac monitor as instructed by cardiology.  Hiccough Previously persistent hiccoughs were initially  managed with thorazine , now transitioned to liquid metoclopramide  (Reglan ) 10 mL as needed, which is effective. Hiccoughs have resolved. - Continue metoclopramide  (Reglan ) 10 mL as needed for hiccoughs.  I reviewed lab results and outside records for this visit and discussed relevant results with the patient. Diagnosis, plan of care and treatment options were also discussed in detail with the patient. Opportunity provided to ask questions and answers provided to his apparent satisfaction. Provided instructions to call our clinic with any problems, questions or concerns prior to return visit. I recommended to continue follow-up with PCP and sub-specialists. He verbalized understanding and agreed with the plan.   NCCN guidelines have been consulted in the planning of this patients care.  I spent a total of 32 minutes during this encounter with the patient including review of chart and various tests results, discussions about plan of care and coordination of care plan.   Chinita Patten, MD  11/29/2024 5:02 PM  [facility] - A DEPT OF [facility] [address] [place] [address] Dept: [phone] Dept Fax: [phone]    CHIEF COMPLAINT/ REASON FOR VISIT:   Squamous cell carcinoma of the pyriform sinus, cT2, cN3, cM0, stage IV B.   Current Treatment:  Concurrent chemoradiation with weekly cisplatin  started from 10/03/2024  INTERVAL HISTORY:    Discussed the use of AI scribe software for clinical note transcription with the patient, who gave verbal consent to proceed.  History of Present Illness  Dave Williams is a 68 year old male with locally advanced head and neck cancer undergoing definitive chemoradiation who presents for follow-up during ongoing treatment.  He is in the final week of  radiation therapy. He completed seven cycles of cisplatin  chemotherapy last week.  He continues gastrostomy tube feeds, currently receiving 3,000 calories per day in eight sessions. He missed several feedings yesterday due to fatigue and early bedtime after prolonged medical appointments. He reports increased fatigue over the past week and poor sleep quality, with frequent nocturnal awakenings. He has experienced weight loss but notes visible improvement in his condition and expresses optimism regarding future appetite and taste recovery.  He describes severe odynophagia attributed to radiation, characterizing his throat as raw and requiring increased doses of oxycodone  7.5 mg for analgesia. He denies abnormal bleeding or bruising. He had a transient low-grade fever of 101F last night, which resolved spontaneously, and has not had further fevers.  Feeding difficulties persist, managed with gastrostomy tube feeds. He previously had persistent singultus, initially treated with Thorazine  and now controlled with liquid metoclopramide  10 mL before or after tube feeds; singultus has resolved at present.  He has tachycardia, currently managed with resumption of his beta-blocker and ongoing cardiac monitoring per cardiology. Recent laboratory studies show normalization of blood counts following prior chemotherapy-induced cytopenia, with current values: WBC 9,500, hemoglobin 10.7, platelets 302,000.   I have reviewed the past medical history, past surgical history, social history and family history with the patient and they are unchanged from previous note.  HISTORY OF PRESENT ILLNESS:   ONCOLOGY HISTORY:   He presented to his PCP with complains of a recurrent right neck mass.   Patient had US  neck on 06/05/24 showing enlarged lymph nodes in right superior cervical chain measuring 5.1 x 2.1 x 3.0 cm and 2.5 x 1.4 x 1.6 cm. CT soft tissue neck was performed on 06/16/24 showed a 2 cm supraglottic mass to  the right of midline consistent with a  malignancy and bulky malignant lymphadenopathy on the right at level 2 and level 3, with indistinct margins consistent with extra capsular spread. The upper level 2 node shows a diameter of 2.2 x 2.8 cm. The lower level 2 node measures approximately 28 x 17 mm. Level 3 lymph node similarly with indistinct margins shows diameter of 21 x 19 mm.    His PCP referred him to our clinic for further evaluation of neck lymphadenopathy.  He was seen in our rapid diagnostic clinic on 06/19/2024.  CBCD was unremarkable.  Flow cytometry of peripheral blood was also unremarkable.  ESR, CRP, LDH were within normal limits.  HIV, hepatitis panel testing was negative.   To further investigate his symptoms, he was referred to Dr. Tobie on 06/26/24. At that time, patient recalled noticing a right neck mass last year that eventually resolved on its own. However, in January 2025, mass had reoccurred and had increased in size, causing  discomfort in the area.    He then underwent a Transnasal Fiberoptic Laryngoscopy indicating a hypopharynx neck mass involving the pyriform and likely arytenoid submucosally concerning for malignancy. Vocal folds were mobile bilaterally. No lesions on the free edge of the vocal folds. No airway compromise concern   Subsequently, he underwent a right neck mass biopsy on 07/24/24 which revealed invasive squamous cell carcinoma.         He presented for a follow up with Dr. Tobie on 07/31/24 to discuss further treatment options. Patient expressed no interest in surgical interventions. He would like to consider chemoradiation.    PET scan performed on 08/01/24 showed the right eccentric supraglottic mass has a maximum SUV of 6.9 along with hypermetabolic activity in all identified lymph nodes. Scan also noted several scattered pulmonary nodules with the largest measuring 0.4 x 0.5 cm with surveillance recommended as nodules are small in size to detect hypermetabolic  activity.     Family history includes his father having esophageal cancer that metastasized to the spine, his mother having skin and anal cancer, and his sister having thyroid  cancer.    He had consultation with Dr. Izell on 08/06/2024.  Plan made for 7 weeks of radiation therapy and he was referred to us  for concurrent chemotherapy.   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  Patient declined surgical option.  Hence plan made to proceed with concurrent chemoradiation using weekly cisplatin .  Renal function is fluctuating and this needs to be monitored closely.   Further chart review shows patient established care with GI on 06/10/24 after having a positive cologuard test. It was recommended patient have colonoscopy however he prefers to hold off while undergoing oncology work up. GI will reach out to patient in 2 months to schedule.   Started concurrent chemoradiation with weekly cisplatin  from 10/03/2024.  Completed cycle 7 of chemotherapy with cisplatin  on 11/22/2024.  He had progressive leukopenia, which resolved with G-CSF support.  Scheduled to complete radiation treatments on 12/04/2024.  Oncology History  Cancer of pyriform sinus (HCC)  08/07/2024 Initial Diagnosis   Cancer of pyriform sinus (HCC)   08/07/2024 Cancer Staging   Staging form: Pharynx - Hypopharynx, AJCC 8th Edition - Clinical stage from 08/07/2024: Stage IVB (cT2, cN3, cM0) - Signed by Izell Domino, MD on 08/07/2024 Stage prefix: Initial diagnosis   10/03/2024 - 11/22/2024 Chemotherapy   Patient is on Treatment Plan : HEAD/NECK Cisplatin  (40) q7d         REVIEW OF SYSTEMS:  Review of Systems - Oncology  All other pertinent systems were reviewed with the patient and are negative.  ALLERGIES: He is allergic to prednisone .  MEDICATIONS:  Current Outpatient Medications  Medication Sig Dispense Refill   acetaminophen  (TYLENOL ) 500 MG tablet Take 1,000 mg by mouth  every 6 (six) hours as needed (pain.).     ALPRAZolam  (XANAX ) 0.5 MG tablet Take 1 tablet (0.5 mg total) by mouth at bedtime as needed for anxiety or sleep. 30 tablet 0   amLODipine  (NORVASC ) 10 MG tablet Take 1 tablet (10 mg total) by mouth daily. 90 tablet 2   aspirin  EC 81 MG tablet Take 1 tablet (81 mg total) by mouth daily. Swallow whole.     atorvastatin  (LIPITOR ) 80 MG tablet Take 1 tablet (80 mg total) by mouth daily with supper. 90 tablet 3   chlorproMAZINE  (THORAZINE ) 25 MG tablet Take 1 tablet (25 mg total) by mouth 3 (three) times daily as needed for hiccoughs. 30 tablet 1   HYDROcodone -acetaminophen  (NORCO) 7.5-325 MG tablet Take 1 tablet by mouth every 6 (six) hours as needed for moderate pain (pain score 4-6). 60 tablet 0   isosorbide  mononitrate (IMDUR ) 30 MG 24 hr tablet Take 1 tablet (30 mg total) by mouth daily. 90 tablet 1   magic mouthwash (nystatin , lidocaine , diphenhydrAMINE , alum & mag hydroxide) suspension Swallow 5 mls up to 4 times per day for pain 720 mL 0   metoCLOPramide  (REGLAN ) 5 MG/5ML solution Take 10 mLs (10 mg total) by mouth 4 (four) times daily -  before meals and at bedtime. Do not take chlorpromazine  or prochlorperazine  with this medication. 1000 mL 1   metoprolol  succinate (TOPROL -XL) 25 MG 24 hr tablet Take 25 mg by mouth daily.     nitroGLYCERIN  (NITROSTAT ) 0.4 MG SL tablet DISSOLVE 1 TABLET UNDER THE TONGUE EVERY 5 MINUTES AS NEEDED FOR CHEST PAIN. DO NOT EXCEED A TOTAL OF 3 DOSES IN 15 MINUTES. 25 tablet 6   Nutritional Supplements (NUTREN 1.5) LIQD 2 cartons Nutren 1.5 via G-tube 4 times daily with 60 mL free water before and after each bolus feeding.  Give additional 300 mL water 3 times daily between feeds.  Provides 3000 cal, 136 g protein, 2908 mL free water./100% estimated needs. 1896 mL 12   pantoprazole  (PROTONIX ) 40 MG tablet Take 1 tablet (40 mg total) by mouth daily. 30 tablet 0   vitamin D3 (CHOLECALCIFEROL ) 25 MCG tablet Take 1 tablet (1,000  Units total) by mouth daily. 30 tablet 0   No current facility-administered medications for this visit.     VITALS:   Blood pressure 131/86, pulse 94, temperature 97.9 F (36.6 C), temperature source Oral, resp. rate 18, height 6' 1 (1.854 m), weight 216 lb 6.4 oz (98.2 kg), SpO2 94%.  Wt Readings from Last 3 Encounters:  11/29/24 216 lb 6.4 oz (98.2 kg)  11/28/24 216 lb (98 kg)  11/22/24 220 lb 12.8 oz (100.2 kg)    Body mass index is 28.55 kg/m.    Onc Performance Status - 11/29/24 1450       ECOG Perf Status   ECOG Perf Status Restricted in physically strenuous activity but ambulatory and able to carry out work of a light or sedentary nature, e.g., light house work, office work      KPS SCALE   KPS % SCORE Normal activity with effort, some s/s of disease            PHYSICAL EXAM:   Physical Exam  Constitutional:      General: He is not in acute distress.    Appearance: Normal appearance.  HENT:     Head: Normocephalic and atraumatic.  Eyes:     Conjunctiva/sclera: Conjunctivae normal.  Cardiovascular:     Rate and Rhythm: Normal rate and regular rhythm.  Pulmonary:     Effort: Pulmonary effort is normal. No respiratory distress.  Chest:     Comments: Port-A-Cath in place without signs of infection. Abdominal:     General: There is no distension.  Lymphadenopathy:     Cervical: Cervical adenopathy (Right Level 2/3 mass, fixed, ~ 5cm, less firm compared to prior) present.  Neurological:     General: No focal deficit present.     Mental Status: He is alert and oriented to person, place, and time.  Psychiatric:        Mood and Affect: Mood normal.        Behavior: Behavior normal.      LABORATORY DATA:   I have reviewed the data as listed.  Results for orders placed or performed in visit on 11/29/24  Magnesium   Result Value Ref Range   Magnesium  2.3 1.7 - 2.4 mg/dL  Basic Metabolic Panel - Cancer Center Only  Result Value Ref Range   Sodium 134  (L) 135 - 145 mmol/L   Potassium 4.5 3.5 - 5.1 mmol/L   Chloride 97 (L) 98 - 111 mmol/L   CO2 26 22 - 32 mmol/L   Glucose, Bld 103 (H) 70 - 99 mg/dL   BUN 19 8 - 23 mg/dL   Creatinine 9.01 9.38 - 1.24 mg/dL   Calcium  9.3 8.9 - 10.3 mg/dL   GFR, Estimated >39 >39 mL/min   Anion gap 11 5 - 15  CBC with Differential (Cancer Center Only)  Result Value Ref Range   WBC Count 9.5 4.0 - 10.5 K/uL   RBC 3.43 (L) 4.22 - 5.81 MIL/uL   Hemoglobin 10.7 (L) 13.0 - 17.0 g/dL   HCT 68.4 (L) 60.9 - 47.9 %   MCV 91.8 80.0 - 100.0 fL   MCH 31.2 26.0 - 34.0 pg   MCHC 34.0 30.0 - 36.0 g/dL   RDW 83.1 (H) 88.4 - 84.4 %   Platelet Count 302 150 - 400 K/uL   nRBC 1.9 (H) 0.0 - 0.2 %   Neutrophils Relative % 52 %   Neutro Abs 4.9 1.7 - 7.7 K/uL   Lymphocytes Relative 8 %   Lymphs Abs 0.8 0.7 - 4.0 K/uL   Monocytes Relative 22 %   Monocytes Absolute 2.1 (H) 0.1 - 1.0 K/uL   Eosinophils Relative 0 %   Eosinophils Absolute 0.0 0.0 - 0.5 K/uL   Basophils Relative 1 %   Basophils Absolute 0.1 0.0 - 0.1 K/uL   WBC Morphology See Note    Smear Review Normal platelet morphology    Immature Granulocytes 17 %   Abs Immature Granulocytes 1.63 (H) 0.00 - 0.07 K/uL   Polychromasia PRESENT   Phosphorus  Result Value Ref Range   Phosphorus 3.7 2.5 - 4.6 mg/dL  Results for orders placed or performed in visit on 11/29/24  Rad Onc Aria Session Summary  Result Value Ref Range   Course ID [id number]    Course Start Date 09/23/2024    Session Number 31    Course First Treatment Date 10/03/2024  3:00 PM    Course Last Treatment Date 11/29/2024  1:43 PM    Course Elapsed Days 57  Reference Point ID HN DP    Reference Point Dosage Given to Date 62.00000031 Gy   Reference Point Session Dosage Given 2.00000001 Gy   Plan ID [id number]    Plan Fractions Treated to Date 31    Plan Total Fractions Prescribed 35    Plan Prescribed Dose Per Fraction 2 Gy   Plan Total Prescribed Dose 70.000000 Gy   Plan Primary  Reference Point HN DP       RADIOGRAPHIC STUDIES:  I have personally reviewed the radiological images as listed and agree with the findings in the report.  IR GASTROSTOMY TUBE MOD SED Result Date: 11/05/2024 INDICATION: 68 year old male with history of head neck cancer requiring percutaneous gastrostomy access for supplemental nutrition. EXAM: PERC PLACEMENT GASTROSTOMY MEDICATIONS: Ancef  2 gm IV; Antibiotics were administered within 1 hour of the procedure. ANESTHESIA/SEDATION: Versed  4 mg IV; Fentanyl  100 mcg IV Moderate Sedation Time:  19 The patient was continuously monitored during the procedure by the interventional radiology nurse under my direct supervision. CONTRAST:  15mL OMNIPAQUE  IOHEXOL  300 MG/ML SOLN - administered into the gastric lumen. FLUOROSCOPY TIME:  Six mGy reference air kerma COMPLICATIONS: None immediate. PROCEDURE: Informed written consent was obtained from the patient after a thorough discussion of the procedural risks, benefits and alternatives. All questions were addressed. Maximal Sterile barrier Technique was utilized including caps, mask, sterile gowns, sterile gloves, sterile drape, hand hygiene and skin antiseptic. A timeout was performed prior to the initiation of the procedure. The patient was placed on the procedure table in the supine position. Pre-procedure abdominal film confirmed visualization of the transverse colon. An angled 5-French catheter was passed through the nares into the stomach. The patient was prepped and draped in usual sterile fashion. The stomach was insufflated with air via the indwelling nasogastric tube. Under fluoroscopy, a puncture site was selected and local analgesia achieved with 1% lidocaine  infiltrated subcutaneously. Under fluoroscopic guidance, a gastropexy needle was passed into the stomach and the T-bar suture was released. Entry into the stomach was confirmed with fluoroscopy, aspiration of air, and injection of contrast material.  This was repeated with an additional gastropexy suture (for a total of 2 fasteners). At the center of these gastropexy sutures, a dermatotomy was performed. An 18 gauge needle was passed into the stomach at the site of this dermatotomy, and position within the gastric lumen again confirmed under fluoroscopy using aspiration of air and contrast injection. An Amplatz guidewire was passed through this needle and intraluminal placement within the stomach was confirmed by fluoroscopy. The needle was removed. Over the guidewire, the percutaneous tract was dilated using a 10 mm non-compliant balloon. The balloon was deflated, then pushed into the gastric lumen followed in concert by the 20 Fr gastrostomy tube. The retention balloon of the percutaneous gastrostomy tube was inflated with 10 mL of sterile water. The tube was withdrawn until the retention balloon was at the edge of the gastric lumen. The external bumper was brought to the abdominal wall. Contrast was injected through the gastrostomy tube, confirming intraluminal positioning. The patient tolerated the procedure well without any immediate post-procedural complications. IMPRESSION: Technically successful placement of 20 Fr gastrostomy tube. Ester Sides, MD [facility] Radiology Electronically Signed   By: Ester Sides M.D.   On: 11/05/2024 18:15      CODE STATUS:  Code Status History     Date Active Date Inactive Code Status Order ID Comments User Context   09/25/2024 1523 09/26/2024 0524 Full Code [id number]  Jenna Cordella LABOR, MD HOV   03/13/2024 1550 03/21/2024 1532 Full Code [id number]  Maurice Sharlet GORMAN DEVONNA Inpatient   03/04/2024 1247 03/13/2024 1548 Full Code [id number]  Viviane Lemond FORBES DEVONNA Inpatient   11/28/2023 1625 11/28/2023 2356 Full Code [id number]  Jordan, Peter M, MD Inpatient    Questions for Most Recent Historical Code Status (Order [id number])     Question Answer   By: Consent: discussion  documented in EHR            No orders of the defined types were placed in this encounter.    Future Appointments  Date Time Provider Department Center  12/01/2024 10:15 AM [facility] [id number] [facility] None  12/02/2024 11:30 AM [facility] LINAC 4 [facility] None  12/02/2024 11:45 AM [facility]-SQUIRE [facility] None  12/03/2024 11:15 AM [facility] [id number] [facility] None  12/04/2024 11:15 AM [facility] LINAC 4 [facility] None  12/17/2024 10:15 AM Jacelyn Lupita NOVAK, CCC-SLP [facility]  12/19/2024 10:00 AM Lanis Carbon, Blaire L, PT [facility] None  01/07/2025 12:50 PM [facility] 6 [facility] H&V  01/22/2025 10:30 AM Fountain, Lum L, NP [facility] H&V  01/31/2025  9:00 AM [facility] FLUSH [facility] None  01/31/2025  9:30 AM Muhsin Doris, Chinita, MD [facility] None     This document was completed utilizing speech recognition software. Grammatical errors, random word insertions, pronoun errors, and incomplete sentences are an occasional consequence of this system due to software limitations, ambient noise, and hardware issues. Any formal questions or concerns about the content, text or information contained within the body of this dictation should be directly addressed to the provider for clarification.   "

## 2024-12-01 ENCOUNTER — Other Ambulatory Visit: Payer: Self-pay

## 2024-12-01 ENCOUNTER — Ambulatory Visit: Admission: RE | Admit: 2024-12-01 | Discharge: 2024-12-01 | Attending: Radiation Oncology

## 2024-12-01 DIAGNOSIS — Z510 Encounter for antineoplastic radiation therapy: Secondary | ICD-10-CM | POA: Diagnosis not present

## 2024-12-01 LAB — RAD ONC ARIA SESSION SUMMARY
Course Elapsed Days: 59
Plan Fractions Treated to Date: 32
Plan Prescribed Dose Per Fraction: 2 Gy
Plan Total Fractions Prescribed: 35
Plan Total Prescribed Dose: 70 Gy
Reference Point Dosage Given to Date: 64 Gy
Reference Point Session Dosage Given: 2 Gy
Session Number: 32

## 2024-12-02 ENCOUNTER — Ambulatory Visit: Admission: RE | Admit: 2024-12-02 | Discharge: 2024-12-02 | Attending: Radiation Oncology

## 2024-12-02 ENCOUNTER — Ambulatory Visit

## 2024-12-02 ENCOUNTER — Other Ambulatory Visit: Payer: Self-pay | Admitting: Oncology

## 2024-12-02 ENCOUNTER — Other Ambulatory Visit: Payer: Self-pay

## 2024-12-02 ENCOUNTER — Other Ambulatory Visit (HOSPITAL_COMMUNITY): Payer: Self-pay

## 2024-12-02 DIAGNOSIS — Z510 Encounter for antineoplastic radiation therapy: Secondary | ICD-10-CM | POA: Diagnosis not present

## 2024-12-02 DIAGNOSIS — F5109 Other insomnia not due to a substance or known physiological condition: Secondary | ICD-10-CM

## 2024-12-02 LAB — RAD ONC ARIA SESSION SUMMARY
Course Elapsed Days: 60
Plan Fractions Treated to Date: 33
Plan Prescribed Dose Per Fraction: 2 Gy
Plan Total Fractions Prescribed: 35
Plan Total Prescribed Dose: 70 Gy
Reference Point Dosage Given to Date: 66 Gy
Reference Point Session Dosage Given: 2 Gy
Session Number: 33

## 2024-12-02 MED ORDER — ALPRAZOLAM 0.5 MG PO TABS
0.5000 mg | ORAL_TABLET | Freq: Every evening | ORAL | 0 refills | Status: AC | PRN
  Filled 2024-12-02: qty 30, 30d supply, fill #0

## 2024-12-03 ENCOUNTER — Ambulatory Visit
Admission: RE | Admit: 2024-12-03 | Discharge: 2024-12-03 | Disposition: A | Discharge status: Home / Self Care | Source: Ambulatory Visit | Attending: Radiation Oncology | Admitting: Radiation Oncology

## 2024-12-03 ENCOUNTER — Ambulatory Visit

## 2024-12-03 ENCOUNTER — Other Ambulatory Visit: Payer: Self-pay

## 2024-12-03 DIAGNOSIS — Z510 Encounter for antineoplastic radiation therapy: Secondary | ICD-10-CM | POA: Diagnosis not present

## 2024-12-03 LAB — RAD ONC ARIA SESSION SUMMARY
Course Elapsed Days: 61
Plan Fractions Treated to Date: 34
Plan Prescribed Dose Per Fraction: 2 Gy
Plan Total Fractions Prescribed: 35
Plan Total Prescribed Dose: 70 Gy
Reference Point Dosage Given to Date: 68 Gy
Reference Point Session Dosage Given: 2 Gy
Session Number: 34

## 2024-12-04 ENCOUNTER — Ambulatory Visit
Admission: RE | Admit: 2024-12-04 | Discharge: 2024-12-04 | Disposition: A | Source: Ambulatory Visit | Attending: Radiation Oncology

## 2024-12-04 ENCOUNTER — Ambulatory Visit

## 2024-12-04 ENCOUNTER — Other Ambulatory Visit: Payer: Self-pay

## 2024-12-04 DIAGNOSIS — Z510 Encounter for antineoplastic radiation therapy: Secondary | ICD-10-CM | POA: Diagnosis not present

## 2024-12-04 LAB — RAD ONC ARIA SESSION SUMMARY
Course Elapsed Days: 62
Plan Fractions Treated to Date: 35
Plan Prescribed Dose Per Fraction: 2 Gy
Plan Total Fractions Prescribed: 35
Plan Total Prescribed Dose: 70 Gy
Reference Point Dosage Given to Date: 70 Gy
Reference Point Session Dosage Given: 2 Gy
Session Number: 35

## 2024-12-04 NOTE — Progress Notes (Signed)
 Oncology Nurse Navigator Documentation   Met with Dave Williams after final RT to offer support and to celebrate end of radiation treatment.   Provided verbal post-RT guidance: Importance of keeping all follow-up appts, especially those with Nutrition and SLP. Importance of protecting treatment area from sun. Continuation of Sonafine application 2-3 times daily, application of antibiotic ointment to areas of raw skin; when supply of Sonafine exhausted transition to OTC lotion with vitamin E.  Explained my role as navigator will continue for several more months, encouraged him to call me with needs/concerns.    [name] RN, BSN, OCN Head & Neck Oncology Nurse Navigator [facility] Phone # [phone]  Fax # [phone]

## 2024-12-08 ENCOUNTER — Other Ambulatory Visit (HOSPITAL_BASED_OUTPATIENT_CLINIC_OR_DEPARTMENT_OTHER): Payer: Self-pay | Admitting: Cardiology

## 2024-12-09 ENCOUNTER — Ambulatory Visit

## 2024-12-09 ENCOUNTER — Encounter: Payer: Self-pay | Admitting: *Deleted

## 2024-12-09 NOTE — Radiation Completion Notes (Signed)
 Patient Name: Dave Williams, Dave Williams MRN: [id number] Date of Birth: 10-27-1956 Referring Physician: CHINITA PATTEN, M.D. Date of Service: 2024-12-09 Radiation Oncologist: Lauraine Golden, M.D. 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, M=cM0 Intent: Curative     ==========DELIVERED PLANS==========  First Treatment Date: 2024-10-03 Last Treatment Date: 2024-12-04   Plan Name: HN_HypoPhar Site: Laryngopharynx Technique: IMRT Mode: Photon Dose Per Fraction: 2 Gy Prescribed Dose (Delivered / Prescribed): 70 Gy / 70 Gy Prescribed Fxs (Delivered / Prescribed): 35 / 35     ==========ON TREATMENT VISIT DATES========== 2024-10-07, 2024-10-14, 2024-10-22, 2024-10-28, 2024-11-04, 2024-11-11, 2024-11-21, 2024-11-25, 2024-12-02     ==========UPCOMING VISITS========== 01/31/2025 [facility] EST PT 15 Pasam, Avinash, MD  01/31/2025 [facility] ONCOL[facility] PORT FLUSH W/LAB [facility] MEDONC FLUSH  01/22/2025 [facility] AT [facility] ST OFFICE VISIT Rana Lum CROME, NP  01/07/2025 [facility] IMG [facility] ST ECHO ECHOCARDIOGRAM [facility]ECHO 6  12/27/2024 [facility] ONC FOLLOW UP 20 Golden Lauraine, MD  12/19/2024 [facility] REH AT [address] [place], Raysal L, PT  12/17/2024 [facility] NEURO NEURO ST TREATMENT Schinke, Lupita NOVAK, CCC-SLP        ==========APPENDIX - ON TREATMENT VISIT NOTES==========   See weekly On Treatment Notes in Epic for details in the Media tab (listed as Progress notes on the On Treatment Visit Dates listed above).

## 2024-12-10 ENCOUNTER — Encounter (HOSPITAL_COMMUNITY): Payer: Self-pay | Admitting: *Deleted

## 2024-12-10 ENCOUNTER — Telehealth: Payer: Self-pay | Admitting: Nutrition

## 2024-12-10 DIAGNOSIS — Z951 Presence of aortocoronary bypass graft: Secondary | ICD-10-CM

## 2024-12-10 NOTE — Telephone Encounter (Signed)
 Contact for tube feeding follow up. LM with name and number for return call.

## 2024-12-10 NOTE — Progress Notes (Signed)
 Discharge Progress Report  Patient Details  Name: Dave Williams MRN: [id number] Date of Birth: 12-04-1956 Referring Provider:   Flowsheet Row CARDIAC REHAB PHASE II ORIENTATION from 05/20/2024 in [facility] CARDIAC REHABILITATION  Referring Provider Court Carrier MD     Number of Visits: 35  Reason for Discharge:  Patient reached a stable level of exercise. Patient independent in their exercise. Patient has met program and personal goals.  Smoking History:  Tobacco Use History[1]  Diagnosis:  S/P CABG x 3    Discharge Exercise Prescription (Final Exercise Prescription Changes):  Exercise Prescription Changes - 09/09/24 1500       Response to Exercise   Blood Pressure (Admit) 142/90    Blood Pressure (Exit) 142/80    Heart Rate (Admit) 82 bpm    Heart Rate (Exercise) 103 bpm    Heart Rate (Exit) 89 bpm    Rating of Perceived Exertion (Exercise) 15    Duration Continue with 30 min of aerobic exercise without signs/symptoms of physical distress.    Intensity THRR unchanged      Progression   Progression Continue to progress workloads to maintain intensity without signs/symptoms of physical distress.      Resistance Training   Training Prescription Yes    Weight 7    Reps 10-15      Treadmill   MPH 3    Grade 3    Minutes 15    METs 4.54      REL-XR   Level 5    Speed 49    Minutes 15    METs 3.7      Home Exercise Plan   Plans to continue exercise at Home (comment)    Frequency Add 2 additional days to program exercise sessions.          Functional Capacity:  6 Minute Walk     Row Name 09/30/24 0941         6 Minute Walk   Phase Discharge     Distance 1400 feet     Distance % Change -0.23 %     Distance Feet Change -32 ft     Walk Time 6 minutes     # of Rest Breaks 0     MPH 2.65     METS 3.23     RPE 13     VO2 Peak 11.3     Symptoms Yes (comment)     Comments some pain from portacath procedure last week 3/10     Resting HR 93  bpm     Resting BP 124/64     Max Ex. HR 95 bpm     Max Ex. BP 156/68        Psychological, QOL, Others - Outcomes: PHQ 2/9:    11/22/2024   12:31 PM 10/10/2024    8:06 AM 10/09/2024   11:58 AM 10/03/2024    9:00 AM 09/23/2024   11:45 AM  Depression screen PHQ 2/9  Decreased Interest 0 0 0 0 1  Down, Depressed, Hopeless 0 0 0 0 1  PHQ - 2 Score 0 0 0 0 2  Altered sleeping 0  0 0 2  Tired, decreased energy 0  0 0 2  Change in appetite 0  0 0 1  Feeling bad or failure about yourself  0  0 0 0  Trouble concentrating 0  0 0 1  Moving slowly or fidgety/restless 0  0 0 0  Suicidal thoughts 0  0 0  0  PHQ-9 Score 0  0  0  8   Difficult doing work/chores   Not difficult at all  Somewhat difficult     Data saved with a previous flowsheet row definition       Nutrition & Weight - Outcomes:   Post Biometrics - 09/30/24 0943        Post  Biometrics   Height 6' 1 (1.854 m)    Weight 239 lb 4.8 oz (108.5 kg)    Waist Circumference 41 inches    Hip Circumference 44 inches    Waist to Hip Ratio 0.93 %    BMI (Calculated) 31.58    Grip Strength 30.2 kg    Single Leg Stand 30 seconds            [1]  Social History Tobacco Use  Smoking Status Former   Current packs/day: 0.50   Types: Cigarettes  Smokeless Tobacco Never

## 2024-12-10 NOTE — Progress Notes (Signed)
 Cardiac Individual Treatment Plan  Patient Details  Name: Dave Williams MRN: [id number] Date of Birth: 04/22/1956 Referring Provider:   Flowsheet Row CARDIAC REHAB PHASE II ORIENTATION from 05/20/2024 in [facility] CARDIAC REHABILITATION  Referring Provider Dave Carrier MD    Initial Encounter Date:  Flowsheet Row CARDIAC REHAB PHASE II ORIENTATION from 05/20/2024 in [place] CARDIAC REHABILITATION  Date 05/20/24    Visit Diagnosis: S/P CABG x 3  Patient's Home Medications on Admission: Current Medications[1]  Past Medical History: Past Medical History:  Diagnosis Date   Anxiety    Chronic left hip pain    Compression fracture of spine (HCC)    multiple compression Fx with disc bulge   Coronary artery disease    Hypertension    Pneumonia    As a teenager   Stroke [facility]) 2022   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    Tobacco Use: Tobacco Use History[2]  Labs: Review Flowsheet       Latest Ref Rng & Units 02/29/2024 03/04/2024 03/06/2024  Labs for ITP Cardiac and Pulmonary Rehab  Cholestrol 0 - 200 mg/dL - - 94   LDL (calc) 0 - 99 mg/dL - - 50   HDL-C >59 mg/dL - - 22   Trlycerides <849 mg/dL - - 887   Hemoglobin J8r 4.8 - 5.6 % 5.8  - -  PH, Arterial 7.35 - 7.45 - 7.324  7.306  7.340  7.280  7.271  7.290  7.314  -  PCO2 arterial 32 - 48 mmHg - 41.3  40.1  43.4  32.8  49.2  48.8  51.8  -  Bicarbonate 20.0 - 28.0 mmol/L - 21.1  19.7  23.1  15.4  22.8  23.5  25.5  26.4  -  TCO2 22 - 32 mmol/L - 22  21  24  16  24  23  25  26  27  28  25  25   -  Acid-base deficit 0.0 - 2.0 mmol/L - 4.0  6.0  2.0  10.0  4.0  3.0  1.0  -  O2 Saturation % - 95  97  99  98  98  96  84  100  -    Details       Multiple values from one day are sorted in reverse-chronological order         Capillary Blood Glucose: Lab Results  Component Value Date   GLUCAP 113 (H) 03/08/2024   GLUCAP 121 (H) 03/07/2024   GLUCAP 112  (H) 03/07/2024   GLUCAP 111 (H) 03/07/2024   GLUCAP 136 (H) 03/06/2024     Exercise Target Goals: Exercise Program Goal: Individual exercise prescription set using results from initial 6 min walk test and THRR while considering  patients activity barriers and safety.   Exercise Prescription Goal: Starting with aerobic activity 30 plus minutes a day, 3 days per week for initial exercise prescription. Provide home exercise prescription and guidelines that participant acknowledges understanding prior to discharge.  Activity Barriers & Risk Stratification:   6 Minute Walk:  6 Minute Walk     Row Name 09/30/24 0941         6 Minute Walk   Phase Discharge     Distance 1400 feet     Distance % Change -0.23 %     Distance Feet Change -32 ft     Walk Time 6 minutes     #  of Rest Breaks 0     MPH 2.65     METS 3.23     RPE 13     VO2 Peak 11.3     Symptoms Yes (comment)     Comments some pain from portacath procedure last week 3/10     Resting HR 93 bpm     Resting BP 124/64     Max Ex. HR 95 bpm     Max Ex. BP 156/68        Oxygen Initial Assessment:   Oxygen Re-Evaluation:   Oxygen Discharge (Final Oxygen Re-Evaluation):   Initial Exercise Prescription:   Perform Capillary Blood Glucose checks as needed.  Exercise Prescription Changes:   Exercise Prescription Changes     Row Name 07/17/24 1300 07/26/24 0800 07/26/24 0900 09/09/24 1500       Response to Exercise   Blood Pressure (Admit) 126/82 124/66 -- 142/90    Blood Pressure (Exit) 100/60 126/62 -- 142/80    Heart Rate (Admit) 65 bpm 70 bpm -- 82 bpm    Heart Rate (Exercise) 102 bpm 103 bpm -- 103 bpm    Heart Rate (Exit) 85 bpm 77 bpm -- 89 bpm    Rating of Perceived Exertion (Exercise) 14 13 -- 15    Duration Continue with 30 min of aerobic exercise without signs/symptoms of physical distress. Continue with 30 min of aerobic exercise without signs/symptoms of physical distress. -- Continue with 30  min of aerobic exercise without signs/symptoms of physical distress.    Intensity THRR unchanged THRR unchanged -- THRR unchanged      Progression   Progression Continue to progress workloads to maintain intensity without signs/symptoms of physical distress. Continue to progress workloads to maintain intensity without signs/symptoms of physical distress. -- Continue to progress workloads to maintain intensity without signs/symptoms of physical distress.      Resistance Training   Training Prescription Yes Yes -- Yes    Weight -- 6 -- 7    Reps 10-15 10-15 -- 10-15      Treadmill   MPH 2.7 2.7 -- 3    Grade 3 3 -- 3    Minutes 15 15 -- 15    METs 4.19 4.19 -- 4.54      REL-XR   Level 6 5 -- 5    Speed 50 56 -- 49    Minutes 15 15 -- 15    METs 3.9 4.4 -- 3.7      Home Exercise Plan   Plans to continue exercise at -- -- Home (comment) Home (comment)    Frequency -- -- Add 2 additional days to program exercise sessions. Add 2 additional days to program exercise sessions.    Initial Home Exercises Provided -- -- 07/26/24 --       Exercise Comments:   Exercise Goals and Review:   Exercise Goals Re-Evaluation :  Exercise Goals Re-Evaluation     Row Name 06/17/24 9048 07/15/24 1045 08/07/24 1116 09/12/24 0930 09/13/24 0947     Exercise Goal Re-Evaluation   Exercise Goals Review Increase Physical Activity;Increase Strength and Stamina;Understanding of Exercise Prescription Increase Strength and Stamina;Increase Physical Activity Increase Physical Activity;Increase Strength and Stamina;Understanding of Exercise Prescription Increase Physical Activity;Increase Strength and Stamina;Understanding of Exercise Prescription Increase Physical Activity;Increase Strength and Stamina;Understanding of Exercise Prescription   Comments Dave Williams states that he just joined the [facility] here in [place] and on the days he is not here, he goes there and swims in the pool.  Dave Williams states that on the days he  is not here, he gos to the [facility] and swims in the pool. Dave Williams is doing well in rehab.  He is still going to [facility] to swim on his off days from rehab. He does feel like his stamina is getting better and it is helping him.  He is getting ready to start cancer treatments with both radiation and chemo.  He was encouraged to watch his fatigue and do his best to keep moving.  He is planning to schedule his sessions around rehab so that he can still attend and finish program. Dave Williams has completed 29 sessions of CR. He has missed a few classes due to not feeling well due to cancer treatment. His level on the XR is 4 with RPE of 14 and his is walking at a speed of grade 3 on the treadmill. Will continue to monitor and progress as able Dave Williams is doing well in rehab.  He is still going to [facility] on his off days to swim.  His rest day is Sunday.  He is nearing graduation and we talked about what to do for exercise after graduation. He is gearing up for radiation therapy. He does have a stationary bike and we talked about the importance of continuing to exercise through treatments.   Expected Outcomes Short: Continue to attend rehab. Long:Incorporate more exercise at home. Short: Continue to attend rehab. Long:Incorporate more exercise at home. Short: Continue to exercise throughout cancer treatments Long: Continue to maintain strength Short: continue with levels and increase when feeling well   long: continue to attend rehab and exercise at home when feeling well Short: Improve post Long: Conitnue to make exercise a priority.    Row Name 09/27/24 1001             Exercise Goal Re-Evaluation   Exercise Goals Review Increase Strength and Stamina       Comments Dave Williams is doing great in rehab. He starts chemo next week, so he is really hoping he can continue to make every class. He also talks about once he is done with the program he wants to continue to come and join the [place] class.       Expected Outcomes Short:  Improve post Long: Conitnue to make exercise a priority.           Discharge Exercise Prescription (Final Exercise Prescription Changes):  Exercise Prescription Changes - 09/09/24 1500       Response to Exercise   Blood Pressure (Admit) 142/90    Blood Pressure (Exit) 142/80    Heart Rate (Admit) 82 bpm    Heart Rate (Exercise) 103 bpm    Heart Rate (Exit) 89 bpm    Rating of Perceived Exertion (Exercise) 15    Duration Continue with 30 min of aerobic exercise without signs/symptoms of physical distress.    Intensity THRR unchanged      Progression   Progression Continue to progress workloads to maintain intensity without signs/symptoms of physical distress.      Resistance Training   Training Prescription Yes    Weight 7    Reps 10-15      Treadmill   MPH 3    Grade 3    Minutes 15    METs 4.54      REL-XR   Level 5    Speed 49    Minutes 15    METs 3.7      Home Exercise Plan  Plans to continue exercise at Home (comment)    Frequency Add 2 additional days to program exercise sessions.          Nutrition:  Target Goals: Understanding of nutrition guidelines, daily intake of sodium 1500mg , cholesterol 200mg , calories 30% from fat and 7% or less from saturated fats, daily to have 5 or more servings of fruits and vegetables.  Biometrics:   Lucia Amen - 09/30/24 0943        Post  Biometrics   Height 6' 1 (1.854 m)    Weight 239 lb 4.8 oz (108.5 kg)    Waist Circumference 41 inches    Hip Circumference 44 inches    Waist to Hip Ratio 0.93 %    BMI (Calculated) 31.58    Grip Strength 30.2 kg    Single Leg Stand 30 seconds          Nutrition Therapy Plan and Nutrition Goals:   Nutrition Assessments:  MEDIFICTS Score Key: >=70 Need to make dietary changes  40-70 Heart Healthy Diet <= 40 Therapeutic Level Cholesterol Diet  Flowsheet Row Documentation from 09/23/2024 in [facility] CARDIAC REHABILITATION  Picture Your Plate Total  Score on Discharge 57   Picture Your Plate Scores: <59 Unhealthy dietary pattern with much room for improvement. 41-50 Dietary pattern unlikely to meet recommendations for good health and room for improvement. 51-60 More healthful dietary pattern, with some room for improvement.  >60 Healthy dietary pattern, although there may be some specific behaviors that could be improved.    Nutrition Goals Re-Evaluation:  Nutrition Goals Re-Evaluation     Row Name 06/17/24 0947 07/15/24 1043 08/07/24 1123 09/13/24 0951 09/27/24 0953     Goals   Nutrition Goal Loose weight. Maintain weight. Short: Continue to attend rehab. Long: Follow the cancer center's diet, yet still following a heart healthy diet. Short; Increase protein Long: Continue to make sure he gets enough calories Short; Increase protein Long: Continue to make sure he gets enough calories   Comment Dave Williams states he wants his goal weight to be 210-215 lbs. He is currently at 230 lbs. He states he eats lots of meat, tries to incorporate veggies and fruits. His water intake is adequate at 4-5 bottles per day, and he is an all day coffee drinker. Will drink 3 cups of caffeinated coffee and the rest of the day he will do de-caf. Dave Williams states that his diet is currently ok. The cancer center told him he needed to try and gain some weight because once he starts treatments it till likely make him sick and loose weight. Dave Williams is prepping to do a mostly liquid diet for a bit as he under goes cancer treatments.  He has his smoothie machine ready. They are going to pull his teeth which will also inhibit his eating. We talked about the importance of making sure he gets enough protein to avoid caexia as well. Dave Williams is getting ready to start his cancer treatments with portacath being placed on 09/25/24. He is still trying to gain weight in prep for cancer treatments.  He is trying to get the protein in.  We talked about making sure he gets calories in once he starts  treatments.  He tries to get protein and vegetables.  He is sticking to fresh or frozen veggies. Bill starts chemo next week. He also had 17 teeth pulled last week so his mouth is pretty sore, he notes. Dave Williams is eating softer foods due to that, but as of  right now still eating good and eating enough protein and calories currently. He is a little concerned how that may change after treatments start.   Expected Outcome Short: Incorporate a more colorful diet, (fruits and veggies). Long: Cut down on the caffeine. Short: Continue to attend rehab. Long: Follow the cancer center's diet, yet still following a heart healthy diet. Short; Increase protein Long: Continue to make sure he gets enough calories Short: Make sure to eat even with treatments LOng: Conitnue to focus on healthy eating Short: try to maintain protein and calorie intake through treatments. Long: eating healthly well balanced meals      Nutrition Goals Discharge (Final Nutrition Goals Re-Evaluation):  Nutrition Goals Re-Evaluation - 09/27/24 0953       Goals   Nutrition Goal Short; Increase protein Long: Continue to make sure he gets enough calories    Comment Bill starts chemo next week. He also had 17 teeth pulled last week so his mouth is pretty sore, he notes. Dave Williams is eating softer foods due to that, but as of right now still eating good and eating enough protein and calories currently. He is a little concerned how that may change after treatments start.    Expected Outcome Short: try to maintain protein and calorie intake through treatments. Long: eating healthly well balanced meals          Psychosocial: Target Goals: Acknowledge presence or absence of significant depression and/or stress, maximize coping skills, provide positive support system. Participant is able to verbalize types and ability to use techniques and skills needed for reducing stress and depression.  Initial Review & Psychosocial Screening:   Quality of  Life Scores:  Scores of 19 and below usually indicate a poorer quality of life in these areas.  A difference of  2-3 points is a clinically meaningful difference.  A difference of 2-3 points in the total score of the Quality of Life Index has been associated with significant improvement in overall quality of life, self-image, physical symptoms, and general health in studies assessing change in quality of life.  PHQ-9: Review Flowsheet  More data exists      11/22/2024 10/10/2024 10/09/2024 10/03/2024 09/23/2024  Depression screen PHQ 2/9  Decreased Interest 0 0 0 0 1  Down, Depressed, Hopeless 0 0 0 0 1  PHQ - 2 Score 0 0 0 0 2  Altered sleeping 0 - 0 0 2  Tired, decreased energy 0 - 0 0 2  Change in appetite 0 - 0 0 1  Feeling bad or failure about yourself  0 - 0 0 0  Trouble concentrating 0 - 0 0 1  Moving slowly or fidgety/restless 0 - 0 0 0  Suicidal thoughts 0 - 0 0 0  PHQ-9 Score 0 - 0  0  8   Difficult doing work/chores - - Not difficult at all - Somewhat difficult    Details       Data saved with a previous flowsheet row definition        Interpretation of Total Score  Total Score Depression Severity:  1-4 = Minimal depression, 5-9 = Mild depression, 10-14 = Moderate depression, 15-19 = Moderately severe depression, 20-27 = Severe depression   Psychosocial Evaluation and Intervention:   Psychosocial Re-Evaluation:  Psychosocial Re-Evaluation     Row Name 06/17/24 0946 07/15/24 1034 08/07/24 1119 09/13/24 0950 09/27/24 0948     Psychosocial Re-Evaluation   Current issues with None Identified Current Stress Concerns Current Stress Concerns;Current Sleep  Concerns Current Stress Concerns;Current Sleep Concerns Current Stress Concerns;Current Sleep Concerns   Comments Dave Williams identifies no stressors in his life at the moment. 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 Dave Williams is currently stressed out and anxious about his cancer issues going on. He has his biopsy next week, so once they do that he will have a better idea of what the treatment options are and how long they will be. Dave Williams is getting set up to start his cancer treatments.  He sees oncologist today to talk about chemo.  They are planning to do 7 weeks of radiation and chemo.  He has declined sugery.  He needs to have his teeth removed prior to treatments.  He is ready to face it head on as he says his alternative is to die.  He wants to take it on.  He does have a strong family history of cancer.  He has not been sleeping well with all of this going on.  Last night the anxiety triggered chest pain and his NTG did help with relief.  We talked about signs to be aware of and to watch for his fatigue.  He feels he is ready and will keep us  posted on how he is doing. Dave Williams is having his teeth pulled next week. He is also getting fitted for his cage on 10/13 and portacath placed on 10/15.  He is gearing up mentally for the treatments. He has been fighting with some pain in his throat leading up to treatments.  He is still not sleeping well.  He tries to deal with it the best he can. Bill had his teeth pulled, 17 he says. He also had his port a cath placed 2 days ago. He says he is dealing with everythig fairly well. He starts chemo next week so he is preparing himself for that. He seems to be in great spirits, says he's ready to get treatment started.   Expected Outcomes Short: Continue to attend rehab. Long: Continue to have a stress free lifestyle. Short: Continue to attend rehab. Long: Find stress outlets to help him with the anxiety he is experiencing. Short; Continue to attend rehab for mental boost Long: COntinue to cope with cancer treatments Short: Stay positive through treatments Long: Conitnue to rest when he can Short: staying positive through chemo/radiation. Long: continue to rest and relax    Interventions Encouraged to attend Cardiac Rehabilitation for the exercise Encouraged to attend Cardiac Rehabilitation for the exercise;Stress management education;Relaxation education Encouraged to attend Cardiac Rehabilitation for the exercise Encouraged to attend Cardiac Rehabilitation for the exercise Encouraged to attend Cardiac Rehabilitation for the exercise   Continue Psychosocial Services  Follow up required by staff Follow up required by staff Follow up required by staff Follow up required by staff Follow up required by staff      Psychosocial Discharge (Final Psychosocial Re-Evaluation):  Psychosocial Re-Evaluation - 09/27/24 0948       Psychosocial Re-Evaluation   Current issues with Current Stress Concerns;Current Sleep Concerns    Comments Bill had his teeth pulled, 17 he says. He also had his port a cath placed 2 days ago. He says he is dealing with everythig fairly well. He starts chemo next week so he is preparing himself for that. He seems to be in great spirits, says he's ready to get treatment started.  Expected Outcomes Short: staying positive through chemo/radiation. Long: continue to rest and relax    Interventions Encouraged to attend Cardiac Rehabilitation for the exercise    Continue Psychosocial Services  Follow up required by staff          Vocational Rehabilitation: Provide vocational rehab assistance to qualifying candidates.   Vocational Rehab Evaluation & Intervention:   Education: Education Goals: Education classes will be provided on a weekly basis, covering required topics. Participant will state understanding/return demonstration of topics presented.  Learning Barriers/Preferences:   Education Topics: Hypertension, Hypertension Reduction -Define heart disease and high blood pressure. Discus how high blood pressure affects the body and ways to reduce high blood pressure.   Exercise and Your Heart -Discuss why it is important to exercise,  the FITT principles of exercise, normal and abnormal responses to exercise, and how to exercise safely.   Angina -Discuss definition of angina, causes of angina, treatment of angina, and how to decrease risk of having angina. Flowsheet Row CARDIAC REHAB PHASE II EXERCISE from 07/17/2024 in [place] [facility] CARDIAC REHABILITATION  Date 05/22/24  Educator [facility]  Instruction Review Code 1- Verbalizes Understanding    Cardiac Medications -Review what the following cardiac medications are used for, how they affect the body, and side effects that may occur when taking the medications.  Medications include Aspirin , Beta blockers, calcium  channel blockers, ACE Inhibitors, angiotensin receptor blockers, diuretics, digoxin, and antihyperlipidemics. Flowsheet Row CARDIAC REHAB PHASE II EXERCISE from 07/17/2024 in [place] CARDIAC REHABILITATION  Date 07/10/24  Educator [facility]  Instruction Review Code 1- Verbalizes Understanding    Congestive Heart Failure -Discuss the definition of CHF, how to live with CHF, the signs and symptoms of CHF, and how keep track of weight and sodium intake.   Heart Disease and Intimacy -Discus the effect sexual activity has on the heart, how changes occur during intimacy as we age, and safety during sexual activity. Flowsheet Row CARDIAC REHAB PHASE II EXERCISE from 07/17/2024 in [place] CARDIAC REHABILITATION  Date 06/12/24  Educator [facility]  Instruction Review Code 1- Verbalizes Understanding    Smoking Cessation / COPD -Discuss different methods to quit smoking, the health benefits of quitting smoking, and the definition of COPD. Flowsheet Row CARDIAC REHAB PHASE II EXERCISE from 07/17/2024 in [place] [facility] CARDIAC REHABILITATION  Date 07/17/24  Educator [facility]  Instruction Review Code 1- Verbalizes Understanding    Nutrition I: Fats -Discuss the types of cholesterol, what cholesterol does to the heart, and how cholesterol levels can be controlled.   Nutrition II: Labels -Discuss  the different components of food labels and how to read food label   Heart Parts/Heart Disease and PAD -Discuss the anatomy of the heart, the pathway of blood circulation through the heart, and these are affected by heart disease.   Stress I: Signs and Symptoms -Discuss the causes of stress, how stress may lead to anxiety and depression, and ways to limit stress.   Stress II: Relaxation -Discuss different types of relaxation techniques to limit stress.   Warning Signs of Stroke / TIA -Discuss definition of a stroke, what the signs and symptoms are of a stroke, and how to identify when someone is having stroke.   Knowledge Questionnaire Score:   Core Components/Risk Factors/Patient Goals at Admission:   Core Components/Risk Factors/Patient Goals Review:   Goals and Risk Factor Review     Row Name 06/17/24 0949 07/15/24 1044 08/07/24 1124 09/13/24 0954 09/27/24 0957     Core Components/Risk Factors/Patient Goals  Review   Personal Goals Review Weight Management/Obesity;Lipids Weight Management/Obesity;Lipids;Hypertension Weight Management/Obesity;Lipids;Hypertension Weight Management/Obesity;Lipids;Hypertension Lipids;Hypertension   Review Dave Williams states he checks all his vitals regularly at home. His BP runs good, usually only 4-10 points off of what we get. His pulse ox runs about 95-97% which is good for someone who used to be a heavy smoker. Takes all medications as prescribed. Dave Williams states that he checks his BP 3-4 times a day. He also checks his pulse oximetry at home which it runs 96-97%, which he considers good since he smoked for 40 years. He takes all his medications as prescribed. Dave Williams is doing well in rehab.  He contineus to keep eye on blood pressre and encourage to really watch it as he starts up his cancer treatments. He is keeping eye on his weight and trying to gain a little bulk prior to starting treatments.  He was encouraged to talk with his oncologist to make sure that  there are no medication interactions prior to starting his chemo. Dave Williams has been working on weight gain to prep for cancer treatments that start this month.  His pressures have been running a little on the higher side with all the extra stress. He has also been having a chronic headache. He has tried a variety of things to help.  Sleep helps some. Bill's blood pressures have gotten better. Bill also noted his headaches were coming from a new glasses prescription, so he switched back to his old glasses and it is better. Dave Williams will start chemo next week, port a cath was placed 2 days ago.   Expected Outcomes Short: Continue to attend rehab. Long: Continue checking vitals at home. Short: Continue to attend rehab. Long: Continue checking vitals at home. Short; Contineu to keep eye on blood pressure lOng: Continue to montior symptoms and weight Short; Continue to keep eye on blood pressures Long Continue to monitor risk factors Short: start chemo treatments, speak with MD if anything is bothering him. Long: continue to monitor health and speak with MD for any significant changes.      Core Components/Risk Factors/Patient Goals at Discharge (Final Review):   Goals and Risk Factor Review - 09/27/24 0957       Core Components/Risk Factors/Patient Goals Review   Personal Goals Review Lipids;Hypertension    Review Bill's blood pressures have gotten better. Bill also noted his headaches were coming from a new glasses prescription, so he switched back to his old glasses and it is better. Dave Williams will start chemo next week, port a cath was placed 2 days ago.    Expected Outcomes Short: start chemo treatments, speak with MD if anything is bothering him. Long: continue to monitor health and speak with MD for any significant changes.          ITP Comments:  ITP Comments     Row Name 06/24/24 [phone] 06/26/24 9178 07/24/24 0951 08/09/24 0935 08/21/24 1417   ITP Comments Patient will be out rest of week for oncology  work up and biopsy. 30 day review completed. ITP sent to Dr. Dorn Ross, Medical Director of Cardiac Rehab. Continue with ITP unless changes are made by physician. 30 day review completed. ITP sent to Dr. Dorn Ross, Medical Director of Cardiac Rehab. Continue with ITP unless changes are made by physician.  Dave Williams is out this week with MD appt Monday and biopsy surgery today. 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 30 day review completed. ITP sent to Dr.  Dorn Ross, Medical Director of Cardiac Rehab. Continue with ITP unless changes are made by physician.    Row Name 09/13/24 0932 09/18/24 0846 10/16/24 0759 11/12/24 0918 12/10/24 1220   ITP Comments Bill is having his teeth pulled next week.  He is also getting fitted for his cage on 10/13 and portacath placed on 10/15. 30 day review completed. ITP sent to Dr. Dorn Ross, Medical Director of Cardiac Rehab. Continue with ITP unless changes are made by physician. 30 day review completed. ITP sent to Dr. Dorn Ross, Medical Director of Cardiac Rehab. Continue with ITP unless changes are made by physician. 30 day review completed. ITP sent to Dr. Dorn Ross, Medical Director of Cardiac Rehab. Continue with ITP unless changes are made by physician. Still has one more visit!  Non attendance due to current treatment schedule. Discharge ITP      Comments: Discharge ITP    [1]  Current Outpatient Medications:    acetaminophen  (TYLENOL ) 500 MG tablet, Take 1,000 mg by mouth every 6 (six) hours as needed (pain.)., Disp: , Rfl:    ALPRAZolam  (XANAX ) 0.5 MG tablet, Take 1 tablet (0.5 mg total) by mouth at bedtime as needed for anxiety or sleep., Disp: 30 tablet, Rfl: 0   amLODipine  (NORVASC ) 10 MG tablet, Take 1 tablet (10 mg total) by mouth daily., Disp: 90 tablet, Rfl: 2   aspirin  EC 81 MG tablet, Take 1 tablet (81 mg total) by mouth daily. Swallow whole., Disp: , Rfl:    atorvastatin  (LIPITOR ) 80 MG tablet,  Take 1 tablet (80 mg total) by mouth daily with supper., Disp: 90 tablet, Rfl: 3   chlorproMAZINE  (THORAZINE ) 25 MG tablet, Take 1 tablet (25 mg total) by mouth 3 (three) times daily as needed for hiccoughs., Disp: 30 tablet, Rfl: 1   HYDROcodone -acetaminophen  (NORCO) 7.5-325 MG tablet, Take 1 tablet by mouth every 6 (six) hours as needed for moderate pain (pain score 4-6)., Disp: 60 tablet, Rfl: 0   isosorbide  mononitrate (IMDUR ) 30 MG 24 hr tablet, Take 1 tablet (30 mg total) by mouth daily., Disp: 90 tablet, Rfl: 1   magic mouthwash (nystatin , lidocaine , diphenhydrAMINE , alum & mag hydroxide) suspension, Swallow 5 mls up to 4 times per day for pain, Disp: 720 mL, Rfl: 0   metoCLOPramide  (REGLAN ) 5 MG/5ML solution, Take 10 mLs (10 mg total) by mouth 4 (four) times daily -  before meals and at bedtime. Do not take chlorpromazine  or prochlorperazine  with this medication., Disp: 1000 mL, Rfl: 1   metoprolol  succinate (TOPROL -XL) 25 MG 24 hr tablet, TAKE ONE TABLET BY MOUTH EVERY DAY, Disp: 90 tablet, Rfl: 3   nitroGLYCERIN  (NITROSTAT ) 0.4 MG SL tablet, DISSOLVE 1 TABLET UNDER THE TONGUE EVERY 5 MINUTES AS NEEDED FOR CHEST PAIN. DO NOT EXCEED A TOTAL OF 3 DOSES IN 15 MINUTES., Disp: 25 tablet, Rfl: 6   Nutritional Supplements (NUTREN 1.5) LIQD, 2 cartons Nutren 1.5 via G-tube 4 times daily with 60 mL free water before and after each bolus feeding.  Give additional 300 mL water 3 times daily between feeds.  Provides 3000 cal, 136 g protein, 2908 mL free water./100% estimated needs., Disp: 1896 mL, Rfl: 12   pantoprazole  (PROTONIX ) 40 MG tablet, Take 1 tablet (40 mg total) by mouth daily., Disp: 30 tablet, Rfl: 0   vitamin D3 (CHOLECALCIFEROL ) 25 MCG tablet, Take 1 tablet (1,000 Units total) by mouth daily., Disp: 30 tablet, Rfl: 0 [2]  Social History Tobacco Use  Smoking Status Former  Current packs/day: 0.50   Types: Cigarettes  Smokeless Tobacco Never

## 2024-12-17 ENCOUNTER — Inpatient Hospital Stay: Admitting: Nutrition

## 2024-12-17 ENCOUNTER — Ambulatory Visit: Payer: Self-pay

## 2024-12-17 NOTE — Progress Notes (Signed)
 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  Patient has completed chemotherapy with weekly cisplatin .Final radiation treatment on December 24.   PEG: November 25 DME: [facility]   Weight:  215.8 pounds December 22 216 pounds December 18 222.8 pounds December 11 219 pounds 8 ounces November 26 234 pounds November 13   Labs reviewed.   Estimated nutrition needs:  2700-3080 cal, 132-154 g protein, greater than 3 L fluid.   Tube feeding: 8 cartons Nutren 1.5 with free water flushes provides 3000 cal, 136 g protein and 2908 mL free water   Reports stomach bug the past 2 days resulting in nausea and vomiting. He is tolerating TF today but is taking it slow and only giving one carton per feeding. He will advance tomorrow if well tolerated.  TF goal is 2 cartons of Nutren 1.5 4 times daily.  He uses 60 mL of free water before and after bolus feeding.  He is drinking 60 ounces of water minimum every day.  Currently, no nutrition impact symptoms.  He plans to try eggs this weekend to see if he can taste them. If they are tolerable, he will continue to advance diet.   Nutrition diagnosis:  Food and nutrition related knowledge deficit, resolved Inadequate oral intake, continues.  Nutrition needs met via PEG.   Intervention: Continue 2 cartons Nutren 1.5 4 times daily with 60 mL free water before and after feedings.  Flush with a minimum of 300 mL free water 3 times a day. Eat and drink by mouth as tolerated.   Monitoring, evaluation, goals: Patient will tolerate adequate calories and protein to minimize loss of lean body mass.   Next visit: Telephone follow up to be scheduled.

## 2024-12-19 ENCOUNTER — Other Ambulatory Visit: Payer: Self-pay | Admitting: Hematology and Oncology

## 2024-12-19 ENCOUNTER — Ambulatory Visit: Payer: Self-pay | Admitting: Physical Therapy

## 2024-12-19 DIAGNOSIS — G893 Neoplasm related pain (acute) (chronic): Secondary | ICD-10-CM

## 2024-12-20 NOTE — Progress Notes (Signed)
 Dave Williams presents to the clinic today for a follow up. He completed radiation treatment for Malignant Neoplasm of pyriform sinus on 12/04/2024.    Pain issues, if any: Yes, [name] says sometimes he has throat pain. Says some days are good where he does not have any throat pain and some days he does. Using a feeding tube?: Using feeding tube, due to taste changes. Weight changes, if any: Weight is stable Swallowing issues, if any: Sometimes has swallowing problem, Has trouble swallowing solid foods and certain drinks have a metallic taste. He is able to drink coffee and water by mouth. Smoking or chewing tobacco? None. Stopped smoking a year ago Using fluoride toothpaste daily? Yes Last ENT visit was on:  Other notable issues, if any:  None

## 2024-12-24 ENCOUNTER — Encounter: Payer: Self-pay | Admitting: General Practice

## 2024-12-24 NOTE — Progress Notes (Signed)
 [facility] Spiritual Care Note  Referred by Delon Malmfelt/RN for additional layer of support, particularly due to two recent close deaths. Reached Mr Smaldone by phone to introduce Spiritual Care as part of his [facility] patient and family support team. Over the weekend, his cousin died after 40 years with MS, and he lost a shipmate. He reports having had the chance to share and process a lot of his distress yesterday, but was appreciative to receive call and direct Spiritual Care number in case further needs arise.   [address] [place], [place], [facility] Pager [phone] Voicemail [phone]

## 2024-12-27 ENCOUNTER — Ambulatory Visit
Admission: RE | Admit: 2024-12-27 | Discharge: 2024-12-27 | Disposition: A | Discharge status: Home / Self Care | Source: Ambulatory Visit | Attending: Radiation Oncology | Admitting: Radiation Oncology

## 2024-12-27 NOTE — Progress Notes (Incomplete)
 " Radiation Oncology         [phone] ________________________________  Name: Dave Williams MRN: [id number]  Date: 12/27/2024  DOB: 12-Jan-1956  Follow-Up Visit Note  CC: Myra Geni ORN, FNP  Myra Geni ORN, FNP  Diagnosis and Prior Radiotherapy:    No diagnosis found.  Cancer Staging  Cancer of pyriform sinus (HCC) Staging form: Pharynx - Hypopharynx, AJCC 8th Edition - Clinical stage from 08/07/2024: Stage IVB (cT2, cN3, cM0) - Signed by Izell Domino, MD on 08/07/2024 Stage prefix: Initial diagnosis First Treatment Date: 2024-10-03 Last Treatment Date: 2024-12-04   Plan Name: HN_HypoPhar Site: Laryngopharynx Technique: IMRT Mode: Photon Dose Per Fraction: 2 Gy Prescribed Dose (Delivered / Prescribed): 70 Gy / 70 Gy Prescribed Fxs (Delivered / Prescribed): 35 / 35  CHIEF COMPLAINT:  Here for follow-up and surveillance of *** cancer  Narrative:  The patient returns today for routine follow-up.  ***                    ALLERGIES:  is allergic to prednisone .  Meds: Current Outpatient Medications  Medication Sig Dispense Refill   acetaminophen  (TYLENOL ) 500 MG tablet Take 1,000 mg by mouth every 6 (six) hours as needed (pain.).     ALPRAZolam  (XANAX ) 0.5 MG tablet Take 1 tablet (0.5 mg total) by mouth at bedtime as needed for anxiety or sleep. 30 tablet 0   amLODipine  (NORVASC ) 10 MG tablet Take 1 tablet (10 mg total) by mouth daily. 90 tablet 2   aspirin  EC 81 MG tablet Take 1 tablet (81 mg total) by mouth daily. Swallow whole.     atorvastatin  (LIPITOR ) 80 MG tablet Take 1 tablet (80 mg total) by mouth daily with supper. 90 tablet 3   chlorproMAZINE  (THORAZINE ) 25 MG tablet Take 1 tablet (25 mg total) by mouth 3 (three) times daily as needed for hiccoughs. 30 tablet 1   HYDROcodone -acetaminophen  (NORCO) 7.5-325 MG tablet Take 1 tablet by mouth every 6 (six) hours as needed for moderate pain (pain score 4-6). 60 tablet 0   isosorbide  mononitrate (IMDUR ) 30 MG 24 hr tablet  Take 1 tablet (30 mg total) by mouth daily. 90 tablet 1   magic mouthwash (nystatin , lidocaine , diphenhydrAMINE , alum & mag hydroxide) suspension Swallow 5 mls up to 4 times per day for pain 720 mL 0   metoCLOPramide  (REGLAN ) 5 MG/5ML solution Take 10 mLs (10 mg total) by mouth 4 (four) times daily -  before meals and at bedtime. Do not take chlorpromazine  or prochlorperazine  with this medication. 1000 mL 1   metoprolol  succinate (TOPROL -XL) 25 MG 24 hr tablet TAKE ONE TABLET BY MOUTH EVERY DAY 90 tablet 3   nitroGLYCERIN  (NITROSTAT ) 0.4 MG SL tablet DISSOLVE 1 TABLET UNDER THE TONGUE EVERY 5 MINUTES AS NEEDED FOR CHEST PAIN. DO NOT EXCEED A TOTAL OF 3 DOSES IN 15 MINUTES. 25 tablet 6   Nutritional Supplements (NUTREN 1.5) LIQD 2 cartons Nutren 1.5 via G-tube 4 times daily with 60 mL free water before and after each bolus feeding.  Give additional 300 mL water 3 times daily between feeds.  Provides 3000 cal, 136 g protein, 2908 mL free water./100% estimated needs. 1896 mL 12   pantoprazole  (PROTONIX ) 40 MG tablet Take 1 tablet (40 mg total) by mouth daily. 30 tablet 0   vitamin D3 (CHOLECALCIFEROL ) 25 MCG tablet Take 1 tablet (1,000 Units total) by mouth daily. 30 tablet 0   No current facility-administered medications for this encounter.  Physical Findings: The patient is in no acute distress. Patient is alert and oriented. Wt Readings from Last 3 Encounters:  11/29/24 216 lb 6.4 oz (98.2 kg)  11/28/24 216 lb (98 kg)  11/22/24 220 lb 12.8 oz (100.2 kg)    vitals were not taken for this visit. .  General: Alert and oriented, in no acute distress HEENT: Head is normocephalic. Extraocular movements are intact. Oropharynx is notable for *** Neck: Neck is notable for *** Skin: Skin in treatment fields shows satisfactory healing *** Heart: Regular in rate and rhythm with no murmurs, rubs, or gallops. Chest: Clear to auscultation bilaterally, with no rhonchi, wheezes, or rales. Abdomen: Soft,  nontender, nondistended, with no rigidity or guarding. Extremities: No cyanosis or edema. Lymphatics: see Neck Exam Psychiatric: Judgment and insight are intact. Affect is appropriate.   Lab Findings: Lab Results  Component Value Date   WBC 9.5 11/29/2024   HGB 10.7 (L) 11/29/2024   HCT 31.5 (L) 11/29/2024   MCV 91.8 11/29/2024   PLT 302 11/29/2024    Lab Results  Component Value Date   TSH 0.649 11/28/2024    Radiographic Findings: No results found.  Impression/Plan:    1) Head and Neck Cancer Status: ***  2) Nutritional Status: *** PEG tube: ***  3) Risk Factors: The patient has been educated about risk factors including alcohol and tobacco abuse; they understand that avoidance of alcohol and tobacco is important to prevent recurrences as well as other cancers  4) Swallowing: ***  5) Dental: Encouraged to continue regular followup with dentistry, and dental hygiene including fluoride rinses. ***  6) Thyroid  function:  Lab Results  Component Value Date   TSH 0.649 11/28/2024    7) Other: ***  8) Follow-up in *** months. The patient was encouraged to call with any issues or questions before then.  On date of service, in total, I spent *** minutes on this encounter. Patient was seen in person. _____________________________________   Lauraine Golden, MD  "

## 2024-12-30 ENCOUNTER — Other Ambulatory Visit: Payer: Self-pay | Admitting: Emergency Medicine

## 2024-12-30 MED ORDER — AMLODIPINE BESYLATE 10 MG PO TABS: 10.0000 mg | ORAL_TABLET | Freq: Every day | ORAL | 2 refills | Status: AC

## 2024-12-31 ENCOUNTER — Ambulatory Visit: Payer: Self-pay | Admitting: Cardiovascular Disease

## 2024-12-31 ENCOUNTER — Ambulatory Visit

## 2024-12-31 DIAGNOSIS — I480 Paroxysmal atrial fibrillation: Secondary | ICD-10-CM

## 2025-01-01 ENCOUNTER — Ambulatory Visit: Payer: Self-pay

## 2025-01-03 ENCOUNTER — Ambulatory Visit: Admitting: Radiation Oncology

## 2025-01-07 ENCOUNTER — Ambulatory Visit (HOSPITAL_COMMUNITY)

## 2025-01-10 ENCOUNTER — Ambulatory Visit: Admitting: Radiation Oncology

## 2025-01-14 ENCOUNTER — Encounter (HOSPITAL_COMMUNITY): Payer: Self-pay

## 2025-01-14 ENCOUNTER — Telehealth: Payer: Self-pay | Admitting: Radiation Oncology

## 2025-01-14 ENCOUNTER — Ambulatory Visit (HOSPITAL_COMMUNITY)

## 2025-01-14 NOTE — Telephone Encounter (Signed)
 LVM for pt after receiving securechat message. Per pt request 2/4 appt cx, r/s to 2/11. Pt was provided c/b number to contact if needed.

## 2025-01-15 ENCOUNTER — Ambulatory Visit: Admitting: Radiation Oncology

## 2025-01-15 NOTE — Progress Notes (Incomplete)
 Pain issues, if any: *** Using a feeding tube?: *** Weight changes, if any: *** Swallowing issues, if any: *** Smoking or chewing tobacco? *** Using fluoride toothpaste daily? *** Last ENT visit was on: *** Other notable issues, if any: ***

## 2025-01-22 ENCOUNTER — Ambulatory Visit: Payer: Self-pay | Admitting: Emergency Medicine

## 2025-01-22 ENCOUNTER — Ambulatory Visit: Admitting: Radiation Oncology

## 2025-01-22 ENCOUNTER — Ambulatory Visit: Payer: Self-pay

## 2025-01-31 ENCOUNTER — Inpatient Hospital Stay: Payer: Self-pay | Admitting: Oncology

## 2025-01-31 ENCOUNTER — Inpatient Hospital Stay: Payer: Self-pay | Attending: Radiation Oncology

## 2025-01-31 ENCOUNTER — Inpatient Hospital Stay: Admitting: Dietician

## 2025-02-04 ENCOUNTER — Ambulatory Visit: Admitting: Cardiovascular Disease
